# Patient Record
Sex: Male | Born: 1935 | Race: White | Hispanic: No | Marital: Married | Smoking: Never smoker
Health system: Southern US, Community
[De-identification: ages and names within clinical notes are randomized; demographics above are authoritative.]

## PROBLEM LIST (undated history)

## (undated) DIAGNOSIS — E785 Hyperlipidemia, unspecified: Secondary | ICD-10-CM

## (undated) DIAGNOSIS — I255 Ischemic cardiomyopathy: Secondary | ICD-10-CM

## (undated) DIAGNOSIS — I480 Paroxysmal atrial fibrillation: Secondary | ICD-10-CM

## (undated) DIAGNOSIS — I10 Essential (primary) hypertension: Secondary | ICD-10-CM

## (undated) DIAGNOSIS — L723 Sebaceous cyst: Secondary | ICD-10-CM

## (undated) DIAGNOSIS — M2630 Unspecified anomaly of tooth position of fully erupted tooth or teeth: Secondary | ICD-10-CM

## (undated) DIAGNOSIS — I491 Atrial premature depolarization: Secondary | ICD-10-CM

## (undated) DIAGNOSIS — I252 Old myocardial infarction: Secondary | ICD-10-CM

## (undated) DIAGNOSIS — M545 Low back pain, unspecified: Secondary | ICD-10-CM

## (undated) DIAGNOSIS — Z8673 Personal history of transient ischemic attack (TIA), and cerebral infarction without residual deficits: Secondary | ICD-10-CM

## (undated) DIAGNOSIS — I2510 Atherosclerotic heart disease of native coronary artery without angina pectoris: Secondary | ICD-10-CM

## (undated) DIAGNOSIS — M66329 Spontaneous rupture of flexor tendons, unspecified upper arm: Secondary | ICD-10-CM

## (undated) DIAGNOSIS — G250 Essential tremor: Secondary | ICD-10-CM

## (undated) DIAGNOSIS — K644 Residual hemorrhoidal skin tags: Secondary | ICD-10-CM

## (undated) DIAGNOSIS — G252 Other specified forms of tremor: Secondary | ICD-10-CM

## (undated) DIAGNOSIS — C61 Malignant neoplasm of prostate: Secondary | ICD-10-CM

## (undated) HISTORY — DX: Atrial premature depolarization: I49.1

## (undated) HISTORY — DX: Sebaceous cyst: L72.3

## (undated) HISTORY — DX: Low back pain: M54.5

## (undated) HISTORY — PX: OTHER SURGICAL HISTORY: SHX169

## (undated) HISTORY — DX: Ischemic cardiomyopathy: I25.5

## (undated) HISTORY — DX: Malignant neoplasm of prostate: C61

## (undated) HISTORY — DX: Essential (primary) hypertension: I10

## (undated) HISTORY — DX: Unspecified anomaly of tooth position of fully erupted tooth or teeth: M26.30

## (undated) HISTORY — DX: Residual hemorrhoidal skin tags: K64.4

## (undated) HISTORY — DX: Hyperlipidemia, unspecified: E78.5

## (undated) HISTORY — PX: BACK SURGERY: SHX140

## (undated) HISTORY — DX: Other specified forms of tremor: G25.2

## (undated) HISTORY — DX: Low back pain, unspecified: M54.50

## (undated) HISTORY — DX: Old myocardial infarction: I25.2

## (undated) HISTORY — DX: Essential tremor: G25.0

## (undated) HISTORY — DX: Spontaneous rupture of flexor tendons, unspecified upper arm: M66.329

## (undated) HISTORY — DX: Personal history of transient ischemic attack (TIA), and cerebral infarction without residual deficits: Z86.73

---
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
## 1998-06-02 HISTORY — PX: SPINE SURGERY: SHX786

## 1998-12-13 ENCOUNTER — Encounter: Payer: Self-pay | Admitting: Neurosurgery

## 1998-12-13 ENCOUNTER — Ambulatory Visit (HOSPITAL_COMMUNITY): Admission: RE | Admit: 1998-12-13 | Discharge: 1998-12-13 | Payer: Self-pay | Admitting: Neurosurgery

## 1999-02-01 ENCOUNTER — Encounter: Payer: Self-pay | Admitting: Neurosurgery

## 1999-02-05 ENCOUNTER — Ambulatory Visit (HOSPITAL_COMMUNITY): Admission: RE | Admit: 1999-02-05 | Discharge: 1999-02-05 | Payer: Self-pay | Admitting: Neurosurgery

## 1999-02-05 ENCOUNTER — Encounter: Payer: Self-pay | Admitting: Neurosurgery

## 1999-02-06 ENCOUNTER — Inpatient Hospital Stay (HOSPITAL_COMMUNITY): Admission: RE | Admit: 1999-02-06 | Discharge: 1999-02-06 | Payer: Self-pay | Admitting: Neurosurgery

## 1999-02-06 ENCOUNTER — Encounter: Payer: Self-pay | Admitting: Neurosurgery

## 2003-06-22 ENCOUNTER — Inpatient Hospital Stay (HOSPITAL_COMMUNITY): Admission: EM | Admit: 2003-06-22 | Discharge: 2003-06-24 | Payer: Self-pay | Admitting: Emergency Medicine

## 2003-06-23 ENCOUNTER — Encounter: Payer: Self-pay | Admitting: Cardiology

## 2003-09-16 ENCOUNTER — Emergency Department (HOSPITAL_COMMUNITY): Admission: EM | Admit: 2003-09-16 | Discharge: 2003-09-16 | Payer: Self-pay | Admitting: Emergency Medicine

## 2004-04-30 ENCOUNTER — Ambulatory Visit (HOSPITAL_COMMUNITY): Admission: RE | Admit: 2004-04-30 | Discharge: 2004-04-30 | Payer: Self-pay | Admitting: Internal Medicine

## 2004-05-01 ENCOUNTER — Ambulatory Visit (HOSPITAL_COMMUNITY): Admission: RE | Admit: 2004-05-01 | Discharge: 2004-05-01 | Payer: Self-pay | Admitting: Internal Medicine

## 2005-02-13 ENCOUNTER — Ambulatory Visit: Payer: Self-pay | Admitting: Internal Medicine

## 2005-03-24 ENCOUNTER — Encounter (INDEPENDENT_AMBULATORY_CARE_PROVIDER_SITE_OTHER): Payer: Self-pay | Admitting: Specialist

## 2005-03-24 ENCOUNTER — Ambulatory Visit: Payer: Self-pay | Admitting: Internal Medicine

## 2010-04-23 ENCOUNTER — Encounter
Admission: RE | Admit: 2010-04-23 | Discharge: 2010-05-22 | Payer: Self-pay | Source: Home / Self Care | Attending: Internal Medicine | Admitting: Internal Medicine

## 2010-10-18 NOTE — H&P (Signed)
NAME:  Henry Franklin, Henry Franklin                            ACCOUNT NO.:  [id number]   MEDICAL RECORD NO.:  [id number]                   PATIENT TYPE:  INP   LOCATION:  1829                                 FACILITY:  [facility]   PHYSICIAN:  Maxwell Caul, M.D.             DATE OF BIRTH:  Nov 14, 1926   DATE OF ADMISSION:  06/22/2003  DATE OF DISCHARGE:                                HISTORY & PHYSICAL   IDENTIFICATION:  This is a 77 year old male, a primary patient of Dr. Lenon Curt. Chilton Si, that arrives in the emergency room from home.   CHIEF COMPLAINT:  Left leg weakness and visual changes.   HISTORY OF PRESENT ILLNESS:  This is a healthy 77 year old man who noticed  over the last 24 to 48 hours that he developed clumsiness of his left leg,  noted that he was catching it on carpets or dragging it; yet yesterday, he  managed to play tennis in the morning.  Today, while driving, he noted that  the line in the middle of the road was curved, which resolved when he  closed one eye, but he does not describe true diplopia.  After this episode,  he came to the emergency room.  He denies headache, visual loss, dysphagia.  There are no sensory or motor changes in the upper extremities.  The  emergency room physician noted difficulties with cerebellar testing and he  has been referred for admission.   PAST MEDICAL HISTORY:  1. Low back pain.  2. Remote history of a seizure disorder in the 1960s, was on Dilantin,     however, has been off this for many years.  3. History of prostatectomy done at [facility] 10 years ago for cancer,     however, he has had no recurrence and has remained well.  He has had a     normal CT scan of the chest in September 2000, except for pleural     parenchymal scarring on the left.  He has had a reasonably normal MRI of     the L spine in 2000.  There is no history of atrial fibrillation, MI,     etc.   MEDICATIONS:  Only medication is enteric-coated ASA 81 mg p.o. daily.   He  takes this for prophylaxis only, denies any recent problems that would  precipitate the aspirin use.   SOCIAL HISTORY:  He lives at home with his wife.  He is active, independent,  plays tennis, drives, etc.   LABORATORY WORK TO DATE:  White count 6.6, hemoglobin 15.8.  INR is 0.9, PTT  30.  His comprehensive metabolic panel actually is quite normal, BUN of 23,  creatinine of 1.1.   CT scan suggests old frontal trauma with a possible prior ventriculostomy,  however, the patient is totally unaware of this history and denies any  previous neurosurgical investigations.   EKG is normal.  PHYSICAL EXAMINATION:  VITAL SIGNS:  Blood pressure 151/91, pulse 61.  HEENT:  Normal.  RESPIRATORY:  Exam is clear.  CARDIAC:  A 1/6 benign-sounding systolic ejection murmur.  There are no  carotid bruits.  ABDOMEN:  Normal.  CIRCULATION:  Normal peripheral pulses.  NEUROLOGIC:  Cranial nerves:  His cranial nerves are normal.  On extraocular  exam, the extraocular movements are normal with no nystagmus.  Visual fields  to confrontation seem normal.  Motor:  He has normal strength in both upper  and lower extremities.  He has no pronator drift.  His sensory exam is  normal to light touch.  Reflexes 1+ knee jerks and brachioradialis.  His  left plantar is clearly flexor; right is equivocal to downgoing.  Cerebellar:  He is very sloppy in the heel-to-shin on the left, also has  some difficulty on the right.  His finger-to-nose is abnormal on the left  side with dysdiadochokinesia and past-pointing.  His gait is mildly  unsteady, slightly wide-based to start.   IMPRESSION:  1. Acute cerebrovascular accident involving the posterior circulation.  I     think this probably involves the left cerebellar hemisphere and possibly     the right.  I also would consider right occipital lobe ischemia giving     him the left visual field problems.  2. Hypertension.  He is not a known hypertensive and this  could be     reflecting the setting of his acute neurologic event.  He does not     require treatment for this for now.   PLAN:  He will require an MRI, MRA, 2-D echo.  Enteric-coated aspirin at the  current dose of 81 mg will be continued, however, if he deteriorates  overnight, he would be a candidate for heparinization.  He has already been  reviewed by Dr. Tomasa Rand, who has kindly seen him for neurology, and he  concurs with current management.                                                Maxwell Caul, M.D.    MGR/MEDQ  D:  06/22/2003  T:  06/23/2003  Job:  [id number]

## 2010-10-18 NOTE — Consult Note (Signed)
NAME:  Henry Franklin, Henry Franklin                            ACCOUNT NO.:  [id number]   MEDICAL RECORD NO.:  [id number]                   PATIENT TYPE:  EMS   LOCATION:  MAJO                                 FACILITY:  [facility]   PHYSICIAN:  Michael L. Thad Ranger, M.D.           DATE OF BIRTH:  08-01-1926   DATE OF CONSULTATION:  06/22/2003  DATE OF DISCHARGE:                                   CONSULTATION   REFERRING PHYSICIAN:  Dr. Maxwell Caul.   REASON FOR EVALUATION:  Probable stroke.   HISTORY OF PRESENT ILLNESS:  This is the initial inpatient consultation  evaluation of this 77 year old man with little past medical history.  The  patient reports that yesterday he felt that his leg was draggy and did not  seem to work right.  He was actually able to play tennis yesterday but  symptoms persisted into the day.  He also had a single episode today while  driving of a vision alteration in which he said that the median stripe  curved to the left.  He was able to fix this by covering one eye or the  other.  He denied any definite diplopia, visual loss, dysarthria, or any  numbness or weakness of the extremities and does not feel that he had any  difficulty using his left arm.  He denies any previous symptoms.  He denies  any associated headache, chest pain, shortness of breath or palpitations.   PAST MEDICAL HISTORY:  He denies chronic medical problems.  There is a  question of him having a head injury in the past but he does not seem to  know much about this.   FAMILY HISTORY:  His family history is remarkable for diabetes.   SOCIAL HISTORY:  He does not smoke.  He is normally independent in his  activities of daily living and very active, frequently playing tennis.   ALLERGIES:  Denies.   MEDICATIONS:  Baby aspirin.   REVIEW OF SYSTEMS:  Full 10-system review of systems is negative, except as  outlined in the HPI in admission H&P.   PHYSICAL EXAM:  VITAL SIGNS:  Temperature 97.1, blood  pressure 151/91, pulse  61 and respirations 20.  GENERAL:  On general examination, he is alert, healthy-appearing and in no  evident distress.  HEAD:  Cranium is normocephalic and atraumatic.  Oropharynx is benign.  NECK:  Neck is supple without carotid or supraclavicular bruits.  HEART:  Bigeminy pattern but no murmurs.  NEUROLOGIC:  Mental status:  He is awake, alert and oriented to time, place  and person.  Recent and remote memory are intact.  Attention span,  concentration and fund of knowledge are appropriate.  Speech is fluent and  not dysarthric and he is able to repeat a phrase.  Mood is euthymic and  affect appropriate.  Cranial nerves:  Funduscopic exam is benign.  Pupils  are equal and briskly  reactive.  Extraocular movements are normal without  nystagmus.  Visual fields are full to confrontation.  Hearing is intact and  symmetric to finger rub.  Facial sensation is intact to pinprick.  Face,  tongue and palate move normally and symmetrically.  Shoulder shrug strength  is normal.  Motor testing:  Normal bulk and tone, normal strength in all  tested extremity muscles.  Sensation intact to light touch and pinprick  throughout.  Coordination:  Rapid movements are performed well.  Finger-to-  nose elicits a little bit of an end-point tremor on the left but very  significant heel-to-shin dysmetria on the left; there is also a bit of heel-  to-shin dysmetria on the right, not nearly as severe.  Gait:  He arises from  the bed with a bit of difficulty.  He is able to ambulate but is a bit  unsteady and tends to lean to the left just a bit.  Reflexes are 2+ and  symmetric.  Toes are downgoing.   LABORATORY REVIEW:  CBC is unremarkable.  CMET is remarkable only for a  mildly elevated glucose of 103.  Coagulations are normal.   CT of the head is personally reviewed and demonstrates some old frontal  calcifications which appear to be an old ventriculostomy, but no definite  acute  process.   IMPRESSION:  Left hemi-ataxia most likely due to subacute left cerebral  stroke.   RECOMMENDATIONS:  Would admit for stroke workup including MRI, MRA of the  head and neck, echocardiogram, lipids, homocysteine level.  Stroke service  will follow.                                               Michael L. Thad Ranger, M.D.    MLR/MEDQ  D:  06/22/2003  T:  06/24/2003  Job:  [id number]   cc:   Lenon Curt. Chilton Si, M.D.  [address]  [place]  [place] [address]  Fax: [phone]

## 2010-10-18 NOTE — Discharge Summary (Signed)
NAME:  Henry Franklin, Henry Franklin                            ACCOUNT NO.:  [id number]   MEDICAL RECORD NO.:  [id number]                   PATIENT TYPE:  INP   LOCATION:  3016                                 FACILITY:  [facility]   PHYSICIAN:  Maxwell Caul, M.D.             DATE OF BIRTH:  Jun 10, 1926   DATE OF ADMISSION:  06/22/2003  DATE OF DISCHARGE:  06/24/2003                                 DISCHARGE SUMMARY   HISTORY OF PRESENT ILLNESS:  This is a 77 year old man, a primary patient of  Dr. Frederik Pear who arrived in the emergency room from home complaining of  left leg weakness and visual changes.  The patient was stable until 24 to 48  hours prior to admission when he developed clumsiness of his left leg. He  noted that he was catching it on carpets or dragging it, although he managed  to play tennis in the morning of admission.  On the day of admission while  driving he noted that the line in the middle of the road was curved which  resolved when he closed one eye.  There was no history of true diplopia.  After this he came to the emergency room.  He denied headache, visual loss  or dysphagia.  There were no sensory or motor changes in the upper  extremities.   PAST MEDICAL HISTORY:  1. Low back pain.  2. Remote history of a seizure disorder in the 1960's.  He was on Dilantin,     however, this has been discontinued many years ago.  3. History of prostatectomy done at [facility] 10 years ago for cancer,     however, he has had no recurrence and has remained well.  He had a normal     CT scan of the chest in September of 2000.  He had a reasonably normal     MRI of the LS spine in 2000.  There is no history of atrial fibrillation,     myocardial infarction or etc.   MEDICATIONS:  On admission his only medication is enteric coated aspirin 81  mg daily.   LABORATORY DATA/OTHER INVESTIGATIONS:  White blood cell count 6.6,  hemoglobin 15.8  Sodium 136, potassium 4.0, cO2 28, BUN 23,  creatinine 1.1.  Liver function tests were normal.  Lipid profile revealed a total  cholesterol of 179, triglycerides 242, HDL cholesterol at 34, LDL of 97.  Radiology MRI of brain with and without contrast showed an acute infarct in  the right thalamus, calcified lesion in the right frontal lobe.  The feeling  was this could be a cavernous angioma or possibly an unusual, partially  calcified, tumor or infarct.  The patient did have an acute infarct in the  right thalamus, however, a follow up MRI was recommended to look at the area  in the right frontal lobe in six months.  MRA of neck: The  carotid  bifurcation is widely patent.  Both vertebral arteries are widely patent.  Electrocardiogram showed sinus bradycardia.  Echocardiogram showed normal  left ventricular ejection fraction at 55 to 65%.  Wall thickness was mildly  increased.   HOSPITAL COURSE:  This gentleman was admitted with what was felt to be an  acute cerebrovascular accident in the posterior fossa.  As it turned out he  had an acute infarct in the left thalamus.  He was kindly seen in  consultation by Dr. Kelli Hope of neurology.  In the hospital the  patient's symptoms rapidly resolved and he was felt  stable to be discharged by June 24, 2003.  He was discharge on Aggrenox  daily and Lipitor 10 mg daily.  He was to follow up with Dr. Chilton Si in two  weeks post discharge.  It was noted that he would need an MRI of his head in  six months to follow up on the right frontal lobe lesion.                                                Maxwell Caul, M.D.    MGR/MEDQ  D:  08/28/2003  T:  08/29/2003  Job:  [id number]   cc:   Casimiro Needle L. Thad Ranger, M.D.  [address]  [address]  [place]  [place] [address]  Fax: [phone]   Lenon Curt. Chilton Si, M.D.  [address]  [place]  [place] [address]  Fax: [phone]

## 2011-08-13 ENCOUNTER — Encounter: Payer: Self-pay | Admitting: *Deleted

## 2011-11-06 ENCOUNTER — Encounter: Payer: Self-pay | Admitting: Cardiology

## 2012-09-07 ENCOUNTER — Ambulatory Visit (INDEPENDENT_AMBULATORY_CARE_PROVIDER_SITE_OTHER): Payer: Medicare Other | Admitting: Internal Medicine

## 2012-09-07 ENCOUNTER — Encounter: Payer: Self-pay | Admitting: Internal Medicine

## 2012-09-07 VITALS — BP 150/90 | HR 58 | Temp 97.8°F | Resp 12 | Ht 69.0 in | Wt 162.0 lb

## 2012-09-07 DIAGNOSIS — C61 Malignant neoplasm of prostate: Secondary | ICD-10-CM

## 2012-09-07 DIAGNOSIS — E785 Hyperlipidemia, unspecified: Secondary | ICD-10-CM

## 2012-09-07 DIAGNOSIS — L600 Ingrowing nail: Secondary | ICD-10-CM

## 2012-09-07 DIAGNOSIS — R231 Pallor: Secondary | ICD-10-CM

## 2012-09-07 DIAGNOSIS — R251 Tremor, unspecified: Secondary | ICD-10-CM

## 2012-09-07 DIAGNOSIS — R259 Unspecified abnormal involuntary movements: Secondary | ICD-10-CM

## 2012-09-07 DIAGNOSIS — M79609 Pain in unspecified limb: Secondary | ICD-10-CM

## 2012-09-07 DIAGNOSIS — I10 Essential (primary) hypertension: Secondary | ICD-10-CM

## 2012-09-07 DIAGNOSIS — R269 Unspecified abnormalities of gait and mobility: Secondary | ICD-10-CM

## 2012-09-07 MED ORDER — HYDROCHLOROTHIAZIDE 25 MG PO TABS: ORAL_TABLET | ORAL | Status: DC

## 2012-09-07 NOTE — Patient Instructions (Signed)
Resume HCTZ.  Return in about 2 months for lab. We will consider whether you need to resume cholesterol then.

## 2012-09-07 NOTE — Progress Notes (Signed)
Subjective:     Patient ID: Henry Franklin, male   DOB: 1927-03-06, 79 y.o.   MRN: [id number]  HPI Larey Seat playing tennis. He is going to try playing again. Plays doubles. Has laid off playing over the winter due to the bad weather.  He ran out of HCTZ and quit taking it about 6-8 months ago,  Stopped taking the Lipitor in the last few months.  Episode of intense pain in the left great toe in last 2-3 weeks. Now resolved.  Review of Systems  Constitutional: Negative.   HENT: Negative.   Eyes: Negative.   Respiratory: Negative.   Cardiovascular: Negative.   Gastrointestinal: Negative.   Genitourinary: Negative.   Musculoskeletal: Positive for myalgias and back pain.  Skin: Negative.   Neurological:       CVA in 2005. Numbness. Mild tremor.  Hematological: Negative.   Psychiatric/Behavioral: Negative.        Objective: BP 150/90  Pulse 58  Temp(Src) 97.8 F (36.6 C) (Oral)  Resp 12  Ht 5\' 9"  (1.753 m)  Wt 162 lb (73.483 kg)  BMI 23.91 kg/m2  SpO2 96%    Physical Exam  Constitutional: He is oriented to person, place, and time. He appears well-developed and well-nourished. No distress.  HENT:  Head: Atraumatic.  Mild loss of hearing.  Eyes: Conjunctivae and EOM are normal. Pupils are equal, round, and reactive to light.  Neck: Normal range of motion. Neck supple. No JVD present. No tracheal deviation present. No thyromegaly present.  Cardiovascular: Regular rhythm, normal heart sounds and intact distal pulses.  Exam reveals no gallop and no friction rub.   No murmur heard. Pulmonary/Chest: Effort normal and breath sounds normal. No respiratory distress. He has no wheezes. He has no rales.  Abdominal: Soft. Bowel sounds are normal. He exhibits no distension and no mass. There is no tenderness.  Musculoskeletal: Normal range of motion. He exhibits tenderness. He exhibits no edema.  Low back paraspinal muscular  discomfort with flexion,, ext., and rotational movements.   Lymphadenopathy:    He has no cervical adenopathy.  Neurological: He is alert and oriented to person, place, and time. No cranial nerve deficit. Coordination normal.  Reduced vibratory sensation in both feet.  Skin: Skin is warm. No rash noted. No erythema. No pallor.  Psychiatric: He has a normal mood and affect. His behavior is normal. Thought content normal.       Assessment:     1. Ingrown toenail without infection observe  2. Other and unspecified hyperlipidemia Continue periodic lab monitoring - Lipid Panel; Future  3. Unspecified essential hypertension controlled - Basic Metabolic Panel; Future  4. Pallor and flushing(782.6) resolved  5. Tremor Mild and does not interfere with daily functioning  6. Abnormality of gait Mild and does not interfere with tennis  7. Malignant neoplasm of prostate No evidence of replapse  8. Pain in toe, left Ingrown nail has improved. Because of location, check uric acid - Uric Acid; Future

## 2012-09-09 ENCOUNTER — Other Ambulatory Visit: Payer: Self-pay | Admitting: *Deleted

## 2012-09-09 MED ORDER — ASPIRIN-DIPYRIDAMOLE ER 25-200 MG PO CP12: ORAL_CAPSULE | ORAL | Status: DC

## 2012-09-10 ENCOUNTER — Encounter: Payer: Self-pay | Admitting: Cardiology

## 2012-09-13 ENCOUNTER — Telehealth: Payer: Self-pay | Admitting: *Deleted

## 2012-09-13 NOTE — Telephone Encounter (Signed)
Patient read his AVS from 09/07/2012 he has some question about malignmant neoplasm is on there since that was 20 years ago. Also that the code for high blood pressure came up unspecified hypertension. Other concerns about diagnose that is on that form. Patient would like for Dr Chilton Si to call him back

## 2012-09-30 NOTE — Telephone Encounter (Signed)
Per Dr Chilton Si on AVS include old problems and sometimes even resolved problems. Patient has been notified

## 2012-11-08 ENCOUNTER — Other Ambulatory Visit: Payer: Medicare Other

## 2012-11-08 ENCOUNTER — Other Ambulatory Visit: Payer: Self-pay | Admitting: *Deleted

## 2012-11-08 DIAGNOSIS — Z0000 Encounter for general adult medical examination without abnormal findings: Secondary | ICD-10-CM

## 2012-11-08 DIAGNOSIS — I10 Essential (primary) hypertension: Secondary | ICD-10-CM

## 2012-11-08 DIAGNOSIS — E785 Hyperlipidemia, unspecified: Secondary | ICD-10-CM

## 2012-11-09 ENCOUNTER — Encounter: Payer: Self-pay | Admitting: *Deleted

## 2012-11-09 LAB — BASIC METABOLIC PANEL
BUN/Creatinine Ratio: 15 (ref 10–22)
Creatinine, Ser: 1.08 mg/dL (ref 0.76–1.27)
GFR calc Af Amer: 73 mL/min/{1.73_m2} (ref >59)
GFR calc non Af Amer: 63 mL/min/{1.73_m2} (ref >59)
Sodium: 141 mmol/L (ref 134–144)

## 2012-11-09 LAB — LIPID PANEL
Cholesterol, Total: 207 mg/dL — ABNORMAL HIGH (ref 100–199)
Triglycerides: 114 mg/dL (ref 0–149)

## 2012-11-10 ENCOUNTER — Ambulatory Visit (INDEPENDENT_AMBULATORY_CARE_PROVIDER_SITE_OTHER): Payer: Medicare Other | Admitting: Internal Medicine

## 2012-11-10 VITALS — BP 130/78 | HR 64 | Temp 98.5°F | Resp 14 | Ht 69.0 in | Wt 157.4 lb

## 2012-11-10 DIAGNOSIS — I491 Atrial premature depolarization: Secondary | ICD-10-CM

## 2012-11-10 DIAGNOSIS — G252 Other specified forms of tremor: Secondary | ICD-10-CM

## 2012-11-10 DIAGNOSIS — M545 Low back pain, unspecified: Secondary | ICD-10-CM

## 2012-11-10 DIAGNOSIS — R002 Palpitations: Secondary | ICD-10-CM

## 2012-11-10 DIAGNOSIS — L600 Ingrowing nail: Secondary | ICD-10-CM

## 2012-11-10 DIAGNOSIS — G250 Essential tremor: Secondary | ICD-10-CM | POA: Insufficient documentation

## 2012-11-10 DIAGNOSIS — E785 Hyperlipidemia, unspecified: Secondary | ICD-10-CM

## 2012-11-10 MED ORDER — ATORVASTATIN CALCIUM 10 MG PO TABS: 10.0000 mg | ORAL_TABLET | Freq: Every day | ORAL | Status: DC

## 2012-11-10 NOTE — Patient Instructions (Signed)
Resume atorvastatin (Lipitor).

## 2012-11-10 NOTE — Progress Notes (Signed)
Subjective:    Patient ID: Henry Franklin, Korte, male    DOB: 04-04-1927, 79 y.o.   MRN: [id number]  HPI Nocturia x 2. Some urgency.  Rare palpitations  Bradycardia.  Dry food like a cracker occasionally sticks in throat.  Relates that he had hepatitis when in the [facility] in 1954.  Current Outpatient Prescriptions on File Prior to Visit  Medication Sig Dispense Refill  . Cholecalciferol (VITAMIN D-3) 1000 UNITS CAPS Take by mouth. Take one tablet by mouth once daily.      [name] dipyridamole-aspirin (AGGRENOX) 200-25 MG per 12 hr capsule Take one capsule twice a day to prevent stroke  60 capsule  5  . dipyridamole-aspirin (AGGRENOX) 25-200 MG per 12 hr capsule Take 1 capsule by mouth daily.      . hydrochlorothiazide (HYDRODIURIL) 25 MG tablet One tablet daily to control BP  90 tablet  4   No current facility-administered medications on file prior to visit.    Review of Systems  Constitutional: Negative.   HENT: Negative.   Eyes: Negative.   Respiratory: Negative.   Cardiovascular: Negative.   Gastrointestinal: Negative.   Genitourinary: Negative.   Musculoskeletal: Positive for myalgias and back pain.  Skin: Negative.   Neurological:       CVA in 2005. Numbness. Mild tremor.  Hematological: Negative.   Psychiatric/Behavioral: Negative.        Objective:BP 130/78  Pulse 64  Temp(Src) 98.5 F (36.9 C) (Oral)  Resp 14  Ht 5\' 9"  (1.753 m)  Wt 157 lb 6.4 oz (71.396 kg)  BMI 23.23 kg/m2  SpO2 93%    Physical Exam  Constitutional: He is oriented to person, place, and time. He appears well-developed and well-nourished. No distress.  HENT:  Head: Atraumatic.  Mild loss of hearing.  Eyes: Conjunctivae and EOM are normal. Pupils are equal, round, and reactive to light.  Neck: Normal range of motion. Neck supple. No JVD present. No tracheal deviation present. No thyromegaly present.  Cardiovascular: Regular rhythm, normal heart sounds and intact distal pulses.  Exam reveals no  gallop and no friction rub.   No murmur heard. Pulmonary/Chest: Effort normal and breath sounds normal. No respiratory distress. He has no wheezes. He has no rales.  Abdominal: Soft. Bowel sounds are normal. He exhibits no distension and no mass. There is no tenderness.  Musculoskeletal: Normal range of motion. He exhibits tenderness. He exhibits no edema.  Low back paraspinal muscular  discomfort with flexion,, ext., and rotational movements.  Lymphadenopathy:    He has no cervical adenopathy.  Neurological: He is alert and oriented to person, place, and time. No cranial nerve deficit. Coordination normal.  Reduced vibratory sensation in both feet.  Skin: Skin is warm. No rash noted. No erythema. No pallor.  .Area of the left great toe has a a surface that appears to be a healed area of a previous ingrown toenail.  Psychiatric: He has a normal mood and affect. His behavior is normal. Thought content normal.     Appointment on 11/08/2012  Component Date Value Range Status  . Uric Acid 11/08/2012 6.3  3.7 - 8.6 mg/dL Final              Therapeutic target for gout patients: <6.0  . Cholesterol, Total 11/08/2012 207* 100 - 199 mg/dL Final  . Triglycerides 11/08/2012 114  0 - 149 mg/dL Final  . HDL 44/06/270 48  >39 mg/dL Final   Comment: According to ATP-III Guidelines, HDL-C >59 mg/dL is considered  a                          negative risk factor for CHD.  [name] VLDL Cholesterol Cal 11/08/2012 23  5 - 40 mg/dL Final  . LDL Calculated 11/08/2012 454* 0 - 99 mg/dL Final  . Chol/HDL Ratio 11/08/2012 4.3  0.0 - 5.0 ratio units Final  . Glucose 11/08/2012 92  65 - 99 mg/dL Final  . BUN 09/81/1914 16  8 - 27 mg/dL Final  . Creatinine, Ser 11/08/2012 1.08  0.76 - 1.27 mg/dL Final  . GFR calc non Af Amer 11/08/2012 63  >59 mL/min/1.73 Final  . GFR calc Af Amer 11/08/2012 73  >59 mL/min/1.73 Final  . BUN/Creatinine Ratio 11/08/2012 15  10 - 22 Final  . Sodium 11/08/2012 141  134 - 144 mmol/L Final   . Potassium 11/08/2012 4.1  3.5 - 5.2 mmol/L Final  . Chloride 11/08/2012 103  97 - 108 mmol/L Final  . CO2 11/08/2012 23  19 - 28 mmol/L Final   Comment: **Effective November 15, 2012, the reference interval for**                            Carbon Dioxide, Total will be changing to:                                                    0 - 30 days        15 - 27                                                 31 d - 5 months       15 - 26                                                  6 m - 11 months      15 - 25                                                    1 - 12 years       17 - 27                                                      > 12 years       18 - 29  . Calcium 11/08/2012 9.1  8.6 - 10.2 mg/dL Final  . PSA 78/29/5621 <0.1  0.0 - 4.0 ng/mL Final   Comment: Roche ECLIA methodology.                          According  to the American Urological Association, Serum PSA should                          decrease and remain at undetectable levels after radical                          prostatectomy. The AUA defines biochemical recurrence as an initial                          PSA value 0.2 ng/mL or greater followed by a subsequent confirmatory                          PSA value 0.2 ng/mL or greater.                          Values obtained with different assay methods or kits cannot be used                          interchangeably. Results cannot be interpreted as absolute evidence                          of the presence or absence of malignant disease.        Assessment & Plan:  Hyperlipidemia: Adequately controlled   - Plan: atorvastatin (LIPITOR) 10 MG tablet, Lipid panel  Essential and other specified forms of tremor: Unchanged  Supraventricular premature beats: Unchanged and do not interfere with his life  Lumbago: Exercise related. He is doing okay with over-the-counter NSAIDs  Palpitations : Minor disturbance  - Plan: Basic metabolic panel  Ingrown toenail without  infection: Appears to have healed

## 2013-01-05 ENCOUNTER — Other Ambulatory Visit: Payer: Self-pay

## 2013-04-07 ENCOUNTER — Other Ambulatory Visit: Payer: Self-pay

## 2013-05-06 ENCOUNTER — Other Ambulatory Visit: Payer: Medicare Other

## 2013-05-06 DIAGNOSIS — R002 Palpitations: Secondary | ICD-10-CM

## 2013-05-06 DIAGNOSIS — E785 Hyperlipidemia, unspecified: Secondary | ICD-10-CM

## 2013-05-07 LAB — LIPID PANEL
Chol/HDL Ratio: 3.2 ratio units (ref 0.0–5.0)
HDL: 54 mg/dL (ref >39)
LDL Calculated: 96 mg/dL (ref 0–99)
Triglycerides: 120 mg/dL (ref 0–149)

## 2013-05-07 LAB — BASIC METABOLIC PANEL
CO2: 26 mmol/L (ref 18–29)
Calcium: 9.9 mg/dL (ref 8.6–10.2)
GFR calc non Af Amer: 59 mL/min/{1.73_m2} — ABNORMAL LOW (ref >59)
Potassium: 4.5 mmol/L (ref 3.5–5.2)
Sodium: 143 mmol/L (ref 134–144)

## 2013-05-10 ENCOUNTER — Ambulatory Visit (INDEPENDENT_AMBULATORY_CARE_PROVIDER_SITE_OTHER): Payer: Medicare Other | Admitting: Internal Medicine

## 2013-05-10 ENCOUNTER — Encounter: Payer: Self-pay | Admitting: Internal Medicine

## 2013-05-10 VITALS — BP 140/80 | HR 68 | Temp 97.8°F | Resp 16 | Wt 161.0 lb

## 2013-05-10 DIAGNOSIS — G250 Essential tremor: Secondary | ICD-10-CM

## 2013-05-10 DIAGNOSIS — M545 Low back pain: Secondary | ICD-10-CM

## 2013-05-10 DIAGNOSIS — W19XXXA Unspecified fall, initial encounter: Secondary | ICD-10-CM | POA: Insufficient documentation

## 2013-05-10 DIAGNOSIS — R002 Palpitations: Secondary | ICD-10-CM

## 2013-05-10 NOTE — Progress Notes (Signed)
Subjective:    Patient ID: Henry Franklin, male    DOB: 09-21-1926, 79 y.o.   MRN: [id number]  Chief Complaint  Patient presents with  . Medical Managment of Chronic Issues    6 month f/u   . other    nasal congestion/allergies has been taking OTC allergy medication with some relief    HPI Had some cough and congestion over the last 3 months. He thinks it is due to allergy.  Essential and other specified forms of tremor: unchanged  Lumbago: unchanged. Worse after exercise  Palpitations: improved  Falls, initial encounter: fell 3 times while playing tennis. No injury. Has quit tennis. Now rides bike. Is trying pickle ball.    Current Outpatient Prescriptions on File Prior to Visit  Medication Sig Dispense Refill  . atorvastatin (LIPITOR) 10 MG tablet Take 1 tablet (10 mg total) by mouth daily. To lower cholesterol  90 tablet  3  . Cholecalciferol (VITAMIN D-3) 1000 UNITS CAPS Take by mouth. Take one tablet by mouth once daily.      [name] dipyridamole-aspirin (AGGRENOX) 200-25 MG per 12 hr capsule Take one capsule twice a day to prevent stroke  60 capsule  5  . dipyridamole-aspirin (AGGRENOX) 25-200 MG per 12 hr capsule Take 1 capsule by mouth daily.      . hydrochlorothiazide (HYDRODIURIL) 25 MG tablet One tablet daily to control BP  90 tablet  4   No current facility-administered medications on file prior to visit.    Review of Systems  Constitutional: Negative.   HENT: Negative.   Eyes: Negative.   Respiratory: Negative.   Cardiovascular: Negative.   Gastrointestinal: Negative.   Genitourinary: Negative.   Musculoskeletal: Positive for back pain and myalgias.  Skin: Negative.   Neurological:       CVA in 2005. Numbness. Mild tremor.  Hematological: Negative.   Psychiatric/Behavioral: Negative.        Objective:BP 140/80  Pulse 68  Temp(Src) 97.8 F (36.6 C) (Oral)  Resp 16  Wt 161 lb (73.029 kg)  SpO2 94%    Physical Exam  Constitutional: He is oriented  to person, place, and time. He appears well-developed and well-nourished. No distress.  HENT:  Head: Atraumatic.  Mild loss of hearing.  Eyes: Conjunctivae and EOM are normal. Pupils are equal, round, and reactive to light.  Neck: Normal range of motion. Neck supple. No JVD present. No tracheal deviation present. No thyromegaly present.  Cardiovascular: Regular rhythm, normal heart sounds and intact distal pulses.  Exam reveals no gallop and no friction rub.   No murmur heard. Pulmonary/Chest: Effort normal and breath sounds normal. No respiratory distress. He has no wheezes. He has no rales.  Abdominal: Soft. Bowel sounds are normal. He exhibits no distension and no mass. There is no tenderness.  Musculoskeletal: Normal range of motion. He exhibits tenderness. He exhibits no edema.  Low back paraspinal muscular  discomfort with flexion,, ext., and rotational movements.  Lymphadenopathy:    He has no cervical adenopathy.  Neurological: He is alert and oriented to person, place, and time. No cranial nerve deficit. Coordination normal.  Reduced vibratory sensation in both feet.  Skin: Skin is warm. No rash noted. No erythema. No pallor.  .Area of the left great toe has a a surface that appears to be a healed area of a previous ingrown toenail.  Psychiatric: He has a normal mood and affect. His behavior is normal. Thought content normal.  Assessment & Plan:  1. Essential and other specified forms of tremor unchanged  2. Lumbago unchanged  3. Palpitations improved  4. Falls, initial encounter Has reduced activity

## 2013-05-10 NOTE — Patient Instructions (Signed)
Continue current medication.

## 2013-05-23 ENCOUNTER — Encounter: Payer: Self-pay | Admitting: Internal Medicine

## 2013-08-08 ENCOUNTER — Other Ambulatory Visit: Payer: Self-pay | Admitting: *Deleted

## 2013-08-10 ENCOUNTER — Other Ambulatory Visit: Payer: Self-pay | Admitting: *Deleted

## 2013-08-10 MED ORDER — ASPIRIN-DIPYRIDAMOLE ER 25-200 MG PO CP12: ORAL_CAPSULE | ORAL | Status: DC

## 2013-08-10 NOTE — Telephone Encounter (Signed)
[facility] 

## 2013-08-19 ENCOUNTER — Other Ambulatory Visit: Payer: Self-pay | Admitting: *Deleted

## 2013-08-19 DIAGNOSIS — E785 Hyperlipidemia, unspecified: Secondary | ICD-10-CM

## 2013-08-19 MED ORDER — ATORVASTATIN CALCIUM 10 MG PO TABS: 10.0000 mg | ORAL_TABLET | Freq: Every day | ORAL | Status: DC

## 2013-08-19 MED ORDER — HYDROCHLOROTHIAZIDE 25 MG PO TABS: ORAL_TABLET | ORAL | Status: DC

## 2013-08-19 NOTE — Telephone Encounter (Signed)
[facility] 

## 2013-10-31 ENCOUNTER — Other Ambulatory Visit: Payer: Self-pay | Admitting: Internal Medicine

## 2013-11-11 ENCOUNTER — Other Ambulatory Visit: Payer: Medicare Other

## 2013-11-11 DIAGNOSIS — G252 Other specified forms of tremor: Principal | ICD-10-CM

## 2013-11-11 DIAGNOSIS — G250 Essential tremor: Secondary | ICD-10-CM

## 2013-11-12 LAB — LIPID PANEL
CHOL/HDL RATIO: 3 ratio (ref 0.0–5.0)
CHOLESTEROL TOTAL: 145 mg/dL (ref 100–199)
HDL: 48 mg/dL (ref >39)
LDL CALC: 78 mg/dL (ref 0–99)
TRIGLYCERIDES: 97 mg/dL (ref 0–149)
VLDL CHOLESTEROL CAL: 19 mg/dL (ref 5–40)

## 2013-11-12 LAB — COMPREHENSIVE METABOLIC PANEL
ALBUMIN: 4.1 g/dL (ref 3.5–4.7)
ALT: 20 IU/L (ref 0–44)
AST: 24 IU/L (ref 0–40)
Albumin/Globulin Ratio: 2.2 (ref 1.1–2.5)
Alkaline Phosphatase: 65 IU/L (ref 39–117)
BILIRUBIN TOTAL: 0.8 mg/dL (ref 0.0–1.2)
BUN / CREAT RATIO: 16 (ref 10–22)
BUN: 19 mg/dL (ref 8–27)
CHLORIDE: 105 mmol/L (ref 97–108)
CO2: 25 mmol/L (ref 18–29)
CREATININE: 1.17 mg/dL (ref 0.76–1.27)
Calcium: 9.6 mg/dL (ref 8.6–10.2)
GFR calc non Af Amer: 57 mL/min/{1.73_m2} — ABNORMAL LOW (ref >59)
GFR, EST AFRICAN AMERICAN: 66 mL/min/{1.73_m2} (ref >59)
GLOBULIN, TOTAL: 1.9 g/dL (ref 1.5–4.5)
GLUCOSE: 93 mg/dL (ref 65–99)
Potassium: 4.2 mmol/L (ref 3.5–5.2)
Sodium: 145 mmol/L — ABNORMAL HIGH (ref 134–144)
TOTAL PROTEIN: 6 g/dL (ref 6.0–8.5)

## 2013-11-15 ENCOUNTER — Ambulatory Visit (INDEPENDENT_AMBULATORY_CARE_PROVIDER_SITE_OTHER): Payer: Medicare Other | Admitting: Internal Medicine

## 2013-11-15 ENCOUNTER — Encounter: Payer: Self-pay | Admitting: Internal Medicine

## 2013-11-15 VITALS — BP 120/72 | HR 58 | Temp 97.9°F | Resp 18 | Ht 69.0 in | Wt 155.0 lb

## 2013-11-15 DIAGNOSIS — M545 Low back pain, unspecified: Secondary | ICD-10-CM

## 2013-11-15 DIAGNOSIS — E785 Hyperlipidemia, unspecified: Secondary | ICD-10-CM

## 2013-11-15 DIAGNOSIS — M48061 Spinal stenosis, lumbar region without neurogenic claudication: Secondary | ICD-10-CM

## 2013-11-15 DIAGNOSIS — W19XXXA Unspecified fall, initial encounter: Secondary | ICD-10-CM

## 2013-11-15 DIAGNOSIS — R002 Palpitations: Secondary | ICD-10-CM

## 2013-11-15 DIAGNOSIS — G250 Essential tremor: Secondary | ICD-10-CM

## 2013-11-15 DIAGNOSIS — G252 Other specified forms of tremor: Secondary | ICD-10-CM

## 2013-11-15 DIAGNOSIS — M47816 Spondylosis without myelopathy or radiculopathy, lumbar region: Secondary | ICD-10-CM | POA: Insufficient documentation

## 2013-11-15 NOTE — Patient Instructions (Signed)
Continue current medications. 

## 2013-11-15 NOTE — Progress Notes (Signed)
Patient ID: Henry Franklin, male   DOB: January 24, 1927, 80 y.o.   MRN: [id number]    Location:    [facility]  Place of Service:  OFFICE  Extended Emergency Contact Information Primary Emergency Contact: Souffrant,Carol A Address: [place], [place] Home Phone: [phone] Relation: Other   Chief Complaint  Patient presents with  . Annual Exam    Drainage down throat x 1 month.    HPI:   Hyperlipidemia: controlled  Essential and other specified forms of tremor  Lumbago: some discomfort when bending over and gardening  Falls: quit playing tennis a few months ago because he fell a few times  Palpitations: none  Having some drainage in back of throat.    Past Medical History  Diagnosis Date  . HTN (hypertension)   . Stroke   . TIA (transient ischemic attack)   . Hyperlipidemia   . Malignant neoplasm of prostate   . Essential and other specified forms of tremor   . Supraventricular premature beats   . Other premature beats   . Cardiac dysrhythmia, unspecified   . Acute, but ill-defined, cerebrovascular disease   . Unspecified late effects of cerebrovascular disease   . External hemorrhoids without mention of complication   . Unspecified anomaly of tooth position   . Cellulitis and abscess of unspecified site   . Sebaceous cyst   . Lumbago   . Nontraumatic rupture of tendons of biceps (long head)   . Other convulsions   . Dizziness and giddiness   . Other malaise and fatigue   . Palpitations   . Encounter for long-term (current) use of other medications     Past Surgical History  Procedure Laterality Date  . Prostate cancer    . Back surgery      with sciatica  . Appendectomy  1942  . Spine surgery N/A 2000    lumbar/[facility]    History   Social History  . Marital Status: Married    Spouse Name: N/A    Number of Children: N/A  . Years of Education: N/A   Occupational History  . retired    Social History Main Topics  . Smoking  status: Never Smoker   . Smokeless tobacco: Not on file  . Alcohol Use: No  . Drug Use: Not on file  . Sexual Activity: Not on file   Other Topics Concern  . Not on file   Social History Narrative  . No narrative on file     reports that he has never smoked. He does not have any smokeless tobacco history on file. He reports that he does not drink alcohol. His drug history is not on file.  Immunization History  Administered Date(s) Administered  . Influenza-Unspecified 02/07/2009, 03/02/2013  . Pneumococcal Polysaccharide-23 06/02/1998  . Tdap 06/24/2011  . Zoster 06/25/2006    No Known Allergies  Medications: Patient's Medications  New Prescriptions   No medications on file  Previous Medications   ATORVASTATIN (LIPITOR) 10 MG TABLET    TAKE 1 TABLET (10 MG TOTAL) BY MOUTH DAILY. TO LOWER CHOLESTEROL   CHOLECALCIFEROL (VITAMIN D-3) 1000 UNITS CAPS    Take by mouth. Take one tablet by mouth once daily.   DIPYRIDAMOLE-ASPIRIN (AGGRENOX) 200-25 MG PER 12 HR CAPSULE    Take one capsule twice a day to prevent stroke   HYDROCHLOROTHIAZIDE (HYDRODIURIL) 25 MG TABLET    TAKE 1 TABLET BY MOUTH  EVERY DAY TO CONTROL BLOOD PRESSURE   MULTIPLE VITAMIN (MULTIVITAMIN) TABLET    Take 1 tablet by mouth daily.  Modified Medications   No medications on file  Discontinued Medications   No medications on file     Review of Systems  Constitutional: Negative.   HENT: Negative.   Eyes: Negative.   Respiratory: Negative.   Cardiovascular: Negative.   Gastrointestinal: Negative.   Genitourinary: Negative.   Musculoskeletal: Positive for back pain and myalgias.  Skin: Negative.   Neurological:       CVA in 2005. Numbness. Mild tremor.  Hematological: Negative.   Psychiatric/Behavioral: Negative.     Filed Vitals:   11/15/13 1404  BP: 120/72  Pulse: 58  Temp: 97.9 F (36.6 C)  Resp: 18  Height: 5\' 9"  (1.753 m)  Weight: 155 lb (70.308 kg)  SpO2: 97%   Body mass index is 22.88  kg/(m^2).  Physical Exam  Constitutional: He is oriented to person, place, and time. He appears well-developed and well-nourished. No distress.  HENT:  Head: Atraumatic.  Mild loss of hearing.  Eyes: Conjunctivae and EOM are normal. Pupils are equal, round, and reactive to light.  Neck: Normal range of motion. Neck supple. No JVD present. No tracheal deviation present. No thyromegaly present.  Cardiovascular: Regular rhythm, normal heart sounds and intact distal pulses.  Exam reveals no gallop and no friction rub.   No murmur heard. Pulmonary/Chest: Effort normal and breath sounds normal. No respiratory distress. He has no wheezes. He has no rales.  Abdominal: Soft. Bowel sounds are normal. He exhibits no distension and no mass. There is no tenderness.  Musculoskeletal: Normal range of motion. He exhibits tenderness. He exhibits no edema.  Low back paraspinal muscular  discomfort with flexion,, ext., and rotational movements.  Lymphadenopathy:    He has no cervical adenopathy.  Neurological: He is alert and oriented to person, place, and time. No cranial nerve deficit. Coordination normal.  Reduced vibratory sensation in both feet.  Skin: Skin is warm. No rash noted. No erythema. No pallor.  .Area of the left great toe has a a surface that appears to be a healed area of a previous ingrown toenail.  Psychiatric: He has a normal mood and affect. His behavior is normal. Thought content normal.     Labs reviewed: Appointment on 11/11/2013  Component Date Value Ref Range Status  . Glucose 11/11/2013 93  65 - 99 mg/dL Final  . BUN 11/11/2013 19  8 - 27 mg/dL Final  . Creatinine, Ser 11/11/2013 1.17  0.76 - 1.27 mg/dL Final  . GFR calc non Af Amer 11/11/2013 57* >59 mL/min/1.73 Final  . GFR calc Af Amer 11/11/2013 66  >59 mL/min/1.73 Final  . BUN/Creatinine Ratio 11/11/2013 16  10 - 22 Final  . Sodium 11/11/2013 145* 134 - 144 mmol/L Final  . Potassium 11/11/2013 4.2  3.5 - 5.2 mmol/L  Final  . Chloride 11/11/2013 105  97 - 108 mmol/L Final  . CO2 11/11/2013 25  18 - 29 mmol/L Final  . Calcium 11/11/2013 9.6  8.6 - 10.2 mg/dL Final  . Total Protein 11/11/2013 6.0  6.0 - 8.5 g/dL Final  . Albumin 11/11/2013 4.1  3.5 - 4.7 g/dL Final  . Globulin, Total 11/11/2013 1.9  1.5 - 4.5 g/dL Final  . Albumin/Globulin Ratio 11/11/2013 2.2  1.1 - 2.5 Final  . Total Bilirubin 11/11/2013 0.8  0.0 - 1.2 mg/dL Final  . Alkaline Phosphatase 11/11/2013 65  39 - 117  IU/L Final  . AST 11/11/2013 24  0 - 40 IU/L Final  . ALT 11/11/2013 20  0 - 44 IU/L Final  . Cholesterol, Total 11/11/2013 145  100 - 199 mg/dL Final  . Triglycerides 11/11/2013 97  0 - 149 mg/dL Final  . HDL 11/11/2013 48  >39 mg/dL Final   Comment: According to ATP-III Guidelines, HDL-C >59 mg/dL is considered a                          negative risk factor for CHD.  [name] VLDL Cholesterol Cal 11/11/2013 19  5 - 40 mg/dL Final  . LDL Calculated 11/11/2013 78  0 - 99 mg/dL Final  . Chol/HDL Ratio 11/11/2013 3.0  0.0 - 5.0 ratio units Final   Comment:                                   T. Chol/HDL Ratio                                                                      Men  Women                                                        1/2 Avg.Risk  3.4    3.3                                                            Avg.Risk  5.0    4.4                                                         2X Avg.Risk  9.6    7.1                                                         3X Avg.Risk 23.4   11.0   11/15/13 EKG: rate 55. NSR. Normal.   Assessment/Plan 1. Hyperlipidemia controlled  2. Essential and other specified forms of tremor unchanged  3. Lumbago Related to spinal stenosis and arthritis in back  4. Falls Unstable on standing. At risk for falls  5. Palpitations none  6. Spinal stenosis of lumbar region discussed repeat MR LS. Last done in 2000. He is not sure he wants this done. I am concerned the Spinal  stenosis is creating more back pain and contributing to the instability and fall risk,.

## 2013-11-18 ENCOUNTER — Encounter: Payer: Self-pay | Admitting: Internal Medicine

## 2014-02-11 ENCOUNTER — Other Ambulatory Visit: Payer: Self-pay | Admitting: Internal Medicine

## 2014-03-17 ENCOUNTER — Other Ambulatory Visit: Payer: Self-pay

## 2014-04-05 ENCOUNTER — Ambulatory Visit (INDEPENDENT_AMBULATORY_CARE_PROVIDER_SITE_OTHER): Payer: Medicare Other | Admitting: Internal Medicine

## 2014-04-05 ENCOUNTER — Encounter: Payer: Self-pay | Admitting: Internal Medicine

## 2014-04-05 VITALS — BP 140/70 | HR 81 | Temp 98.1°F | Ht 69.0 in | Wt 157.2 lb

## 2014-04-05 DIAGNOSIS — M4806 Spinal stenosis, lumbar region: Secondary | ICD-10-CM

## 2014-04-05 DIAGNOSIS — M545 Low back pain, unspecified: Secondary | ICD-10-CM

## 2014-04-05 DIAGNOSIS — G252 Other specified forms of tremor: Principal | ICD-10-CM

## 2014-04-05 DIAGNOSIS — G250 Essential tremor: Secondary | ICD-10-CM

## 2014-04-05 DIAGNOSIS — M48061 Spinal stenosis, lumbar region without neurogenic claudication: Secondary | ICD-10-CM

## 2014-04-05 DIAGNOSIS — R251 Tremor, unspecified: Secondary | ICD-10-CM

## 2014-04-05 DIAGNOSIS — E785 Hyperlipidemia, unspecified: Secondary | ICD-10-CM

## 2014-04-05 DIAGNOSIS — C61 Malignant neoplasm of prostate: Secondary | ICD-10-CM

## 2014-04-05 NOTE — Progress Notes (Signed)
Patient ID: Henry Franklin, male   DOB: 02-16-1927, 80 y.o.   MRN: [id number]    HISTORY AND PHYSICAL  Location:    [facility]   Place of Service:   OFFICE  Extended Emergency Contact Information Primary Emergency Contact: [place],Carol A Address: [address]          [place], [place] Phone: [phone] Relation: Other   Chief Complaint  Patient presents with  . Annual Exam    Yearly physical    HPI:  Essential and other specified forms of tremor - unchanged and not interfering with ADL.  Midline low back pain without sciatica: unchanged  Spinal stenosis of lumbar region: contributes to back pains  Hyperlipidemia - controlled  Prostate cancer - prior surgery. No known relapse.    Past Medical History  Diagnosis Date  . HTN (hypertension)   . Stroke   . TIA (transient ischemic attack)   . Hyperlipidemia   . Malignant neoplasm of prostate   . Essential and other specified forms of tremor   . Supraventricular premature beats   . Other premature beats   . Cardiac dysrhythmia, unspecified   . Acute, but ill-defined, cerebrovascular disease   . Unspecified late effects of cerebrovascular disease   . External hemorrhoids without mention of complication   . Unspecified anomaly of tooth position   . Cellulitis and abscess of unspecified site   . Sebaceous cyst   . Lumbago   . Nontraumatic rupture of tendons of biceps (long head)   . Other convulsions   . Dizziness and giddiness   . Other malaise and fatigue   . Palpitations   . Encounter for long-term (current) use of other medications     Past Surgical History  Procedure Laterality Date  . Prostate cancer    . Back surgery      with sciatica  . Appendectomy  1942  . Spine surgery N/A 2000    [facility]    Patient Care Team: Henry Dooms, MD as PCP - General (Internal Medicine) Charlie Pitter, MD as Consulting Physician (Neurosurgery) Darlin Coco, MD as Consulting Physician (Cardiology) Irene Shipper,  MD as Consulting Physician (Gastroenterology) Lynne Logan, MD as Referring Physician (Urology) Eulis Manly. Gershon Crane, MD as Consulting Physician (Ophthalmology)  History   Social History  . Marital Status: Married    Spouse Name: N/A    Number of Children: N/A  . Years of Education: N/A   Occupational History  . retired    Social History Main Topics  . Smoking status: Never Smoker   . Smokeless tobacco: Not on file  . Alcohol Use: No  . Drug Use: Not on file  . Sexual Activity: Not on file   Other Topics Concern  . Not on file   Social History Narrative     reports that he has never smoked. He does not have any smokeless tobacco history on file. He reports that he does not drink alcohol. His drug history is not on file.  Immunization History  Administered Date(s) Administered  . Influenza-Unspecified 02/07/2009, 03/02/2013  . Pneumococcal Polysaccharide-23 06/02/1998  . Tdap 06/24/2011  . Zoster 06/25/2006    No Known Allergies  Medications: Patient's Medications  New Prescriptions   No medications on file  Previous Medications   ATORVASTATIN (LIPITOR) 10 MG TABLET    TAKE 1 TABLET (10 MG TOTAL) BY MOUTH DAILY. TO LOWER CHOLESTEROL   CHOLECALCIFEROL (VITAMIN D-3) 1000 UNITS CAPS    Take  by mouth. Take one tablet by mouth once daily.   DIPYRIDAMOLE-ASPIRIN (AGGRENOX) 200-25 MG PER 12 HR CAPSULE    TAKE ONE CAPSULE BY MOUTH TWICE DAILY TO PREVENT STROKE   HYDROCHLOROTHIAZIDE (HYDRODIURIL) 25 MG TABLET    TAKE 1 TABLET BY MOUTH EVERY DAY TO CONTROL BLOOD PRESSURE   MULTIPLE VITAMIN (MULTIVITAMIN) TABLET    Take 1 tablet by mouth daily.  Modified Medications   No medications on file  Discontinued Medications   No medications on file     Review of Systems  Constitutional: Negative.   HENT: Negative.   Eyes: Negative.   Respiratory: Negative.   Cardiovascular: Negative.   Gastrointestinal: Negative.   Genitourinary: Negative.   Musculoskeletal: Positive for  myalgias, back pain and gait problem.  Skin: Negative.   Neurological:       CVA in 2005. Numbness. Mild tremor. History of falls while playing tennis.  Hematological: Negative.   Psychiatric/Behavioral: Negative.     Filed Vitals:   04/05/14 1256  BP: 140/70  Pulse: 81  Temp: 98.1 F (36.7 C)  TempSrc: Oral  Height: 5\' 9"  (1.753 m)  Weight: 157 lb 3.2 oz (71.305 kg)  SpO2: 99%   Body mass index is 23.2 kg/(m^2).  Physical Exam  Constitutional: He is oriented to person, place, and time. He appears well-developed and well-nourished. No distress.  HENT:  Head: Atraumatic.  Mild loss of hearing.  Eyes: Conjunctivae and EOM are normal. Pupils are equal, round, and reactive to light.  Neck: Normal range of motion. Neck supple. No JVD present. No tracheal deviation present. No thyromegaly present.  Cardiovascular: Normal rate, regular rhythm, normal heart sounds and intact distal pulses.  Exam reveals no gallop and no friction rub.   No murmur heard. Pulmonary/Chest: Effort normal and breath sounds normal. No respiratory distress. He has no wheezes. He has no rales.  Abdominal: Soft. Bowel sounds are normal. He exhibits no distension and no mass. There is no tenderness.  Musculoskeletal: Normal range of motion. He exhibits tenderness. He exhibits no edema.  Low back paraspinal muscular  discomfort with flexion,, ext., and rotational movements.  Lymphadenopathy:    He has no cervical adenopathy.  Neurological: He is alert and oriented to person, place, and time. No cranial nerve deficit. Coordination normal.  Reduced vibratory sensation in both feet. 04/05/14 MMSE 29/30. Passed clock drawing.  Skin: Skin is warm. No rash noted. No erythema. No pallor.  .Area of the left great toe has a a surface that appears to be a healed area of a previous ingrown toenail.  Psychiatric: He has a normal mood and affect. His behavior is normal. Thought content normal.     Labs reviewed: No  visits with results within 3 Month(s) from this visit. Latest known visit with results is:  Appointment on 11/11/2013  Component Date Value Ref Range Status  . Glucose 11/11/2013 93  65 - 99 mg/dL Final  . BUN 11/11/2013 19  8 - 27 mg/dL Final  . Creatinine, Ser 11/11/2013 1.17  0.76 - 1.27 mg/dL Final  . GFR calc non Af Amer 11/11/2013 57* >59 mL/min/1.73 Final  . GFR calc Af Amer 11/11/2013 66  >59 mL/min/1.73 Final  . BUN/Creatinine Ratio 11/11/2013 16  10 - 22 Final  . Sodium 11/11/2013 145* 134 - 144 mmol/L Final  . Potassium 11/11/2013 4.2  3.5 - 5.2 mmol/L Final  . Chloride 11/11/2013 105  97 - 108 mmol/L Final  . CO2 11/11/2013 25  18 -  29 mmol/L Final  . Calcium 11/11/2013 9.6  8.6 - 10.2 mg/dL Final  . Total Protein 11/11/2013 6.0  6.0 - 8.5 g/dL Final  . Albumin 11/11/2013 4.1  3.5 - 4.7 g/dL Final  . Globulin, Total 11/11/2013 1.9  1.5 - 4.5 g/dL Final  . Albumin/Globulin Ratio 11/11/2013 2.2  1.1 - 2.5 Final  . Total Bilirubin 11/11/2013 0.8  0.0 - 1.2 mg/dL Final  . Alkaline Phosphatase 11/11/2013 65  39 - 117 IU/L Final  . AST 11/11/2013 24  0 - 40 IU/L Final  . ALT 11/11/2013 20  0 - 44 IU/L Final  . Cholesterol, Total 11/11/2013 145  100 - 199 mg/dL Final  . Triglycerides 11/11/2013 97  0 - 149 mg/dL Final  . HDL 11/11/2013 48  >39 mg/dL Final   Comment: According to ATP-III Guidelines, HDL-C >59 mg/dL is considered a                          negative risk factor for CHD.  [name] VLDL Cholesterol Cal 11/11/2013 19  5 - 40 mg/dL Final  . LDL Calculated 11/11/2013 78  0 - 99 mg/dL Final  . Chol/HDL Ratio 11/11/2013 3.0  0.0 - 5.0 ratio units Final   Comment:                                   T. Chol/HDL Ratio                                                                      Men  Women                                                        1/2 Avg.Risk  3.4    3.3                                                            Avg.Risk  5.0    4.4                                                          2X Avg.Risk  9.6    7.1                                                         3X Avg.Risk 23.4   11.0     Assessment/Plan  1. Essential and other specified forms of  tremor - Comprehensive metabolic panel; Future  2. Midline low back pain without sciatica unchanged  3. Spinal stenosis of lumbar region Continue to monitor. He does not want referral at this time.  4. Hyperlipidemia - Lipid panel; Future  5. Prostate cancer - PSA; Future

## 2014-04-05 NOTE — Progress Notes (Signed)
Patient passed clock test.

## 2014-05-17 ENCOUNTER — Ambulatory Visit: Payer: Medicare Other | Admitting: Internal Medicine

## 2014-05-20 ENCOUNTER — Emergency Department (HOSPITAL_COMMUNITY): Payer: Medicare Other

## 2014-05-20 ENCOUNTER — Encounter (HOSPITAL_COMMUNITY): Payer: Self-pay | Admitting: Adult Health

## 2014-05-20 ENCOUNTER — Inpatient Hospital Stay (HOSPITAL_COMMUNITY)
Admission: EM | Admit: 2014-05-20 | Discharge: 2014-05-22 | DRG: 176 | Disposition: A | Discharge status: Home / Self Care | Payer: Medicare Other | Attending: Internal Medicine | Admitting: Internal Medicine

## 2014-05-20 DIAGNOSIS — M48061 Spinal stenosis, lumbar region without neurogenic claudication: Secondary | ICD-10-CM | POA: Diagnosis present

## 2014-05-20 DIAGNOSIS — I679 Cerebrovascular disease, unspecified: Secondary | ICD-10-CM | POA: Diagnosis present

## 2014-05-20 DIAGNOSIS — E785 Hyperlipidemia, unspecified: Secondary | ICD-10-CM | POA: Diagnosis present

## 2014-05-20 DIAGNOSIS — R0602 Shortness of breath: Secondary | ICD-10-CM | POA: Insufficient documentation

## 2014-05-20 DIAGNOSIS — Z7901 Long term (current) use of anticoagulants: Secondary | ICD-10-CM | POA: Diagnosis not present

## 2014-05-20 DIAGNOSIS — I491 Atrial premature depolarization: Secondary | ICD-10-CM | POA: Diagnosis present

## 2014-05-20 DIAGNOSIS — I2699 Other pulmonary embolism without acute cor pulmonale: Secondary | ICD-10-CM | POA: Diagnosis present

## 2014-05-20 DIAGNOSIS — I10 Essential (primary) hypertension: Secondary | ICD-10-CM | POA: Diagnosis present

## 2014-05-20 DIAGNOSIS — M4806 Spinal stenosis, lumbar region: Secondary | ICD-10-CM | POA: Diagnosis present

## 2014-05-20 DIAGNOSIS — Z8546 Personal history of malignant neoplasm of prostate: Secondary | ICD-10-CM | POA: Diagnosis not present

## 2014-05-20 DIAGNOSIS — M47816 Spondylosis without myelopathy or radiculopathy, lumbar region: Secondary | ICD-10-CM | POA: Diagnosis present

## 2014-05-20 DIAGNOSIS — I6990 Unspecified sequelae of unspecified cerebrovascular disease: Secondary | ICD-10-CM

## 2014-05-20 DIAGNOSIS — Z8673 Personal history of transient ischemic attack (TIA), and cerebral infarction without residual deficits: Secondary | ICD-10-CM

## 2014-05-20 LAB — CBC
HEMATOCRIT: 51.3 % (ref 39.0–52.0)
Hemoglobin: 17.2 g/dL — ABNORMAL HIGH (ref 13.0–17.0)
MCH: 31.5 pg (ref 26.0–34.0)
MCHC: 33.5 g/dL (ref 30.0–36.0)
MCV: 94 fL (ref 78.0–100.0)
Platelets: 190 10*3/uL (ref 150–400)
RBC: 5.46 MIL/uL (ref 4.22–5.81)
RDW: 13.7 % (ref 11.5–15.5)
WBC: 11.1 10*3/uL — ABNORMAL HIGH (ref 4.0–10.5)

## 2014-05-20 LAB — HEPATIC FUNCTION PANEL
ALT: 22 U/L (ref 0–53)
AST: 24 U/L (ref 0–37)
Albumin: 3.8 g/dL (ref 3.5–5.2)
Alkaline Phosphatase: 88 U/L (ref 39–117)
TOTAL PROTEIN: 7.1 g/dL (ref 6.0–8.3)
Total Bilirubin: 0.6 mg/dL (ref 0.3–1.2)

## 2014-05-20 LAB — BASIC METABOLIC PANEL
ANION GAP: 13 (ref 5–15)
BUN: 21 mg/dL (ref 6–23)
CALCIUM: 9.5 mg/dL (ref 8.4–10.5)
CO2: 28 mEq/L (ref 19–32)
CREATININE: 1.15 mg/dL (ref 0.50–1.35)
Chloride: 97 mEq/L (ref 96–112)
GFR calc non Af Amer: 56 mL/min — ABNORMAL LOW (ref >90)
GFR, EST AFRICAN AMERICAN: 65 mL/min — AB (ref >90)
Glucose, Bld: 140 mg/dL — ABNORMAL HIGH (ref 70–99)
Potassium: 3.8 mEq/L (ref 3.7–5.3)
Sodium: 138 mEq/L (ref 137–147)

## 2014-05-20 LAB — TROPONIN I: Troponin I: <0.3 ng/mL (ref <0.30)

## 2014-05-20 LAB — I-STAT TROPONIN, ED: Troponin i, poc: 0 ng/mL (ref 0.00–0.08)

## 2014-05-20 LAB — PRO B NATRIURETIC PEPTIDE: PRO B NATRI PEPTIDE: 123.8 pg/mL (ref 0–450)

## 2014-05-20 MED ORDER — HEPARIN BOLUS VIA INFUSION
4000.0000 [IU] | Freq: Once | INTRAVENOUS | Status: AC
  Administered 2014-05-20: 4000 [IU] via INTRAVENOUS
  Filled 2014-05-20: qty 4000

## 2014-05-20 MED ORDER — HEPARIN (PORCINE) IN NACL 100-0.45 UNIT/ML-% IJ SOLN
1100.0000 [IU]/h | INTRAMUSCULAR | Status: DC
  Administered 2014-05-20: 1000 [IU]/h via INTRAVENOUS
  Administered 2014-05-21: 1100 [IU]/h via INTRAVENOUS
  Filled 2014-05-20 (×4): qty 250

## 2014-05-20 MED ORDER — HYDROMORPHONE HCL 1 MG/ML IJ SOLN
0.5000 mg | Freq: Once | INTRAMUSCULAR | Status: AC
  Administered 2014-05-20: 0.5 mg via INTRAVENOUS
  Filled 2014-05-20: qty 1

## 2014-05-20 MED ORDER — IOHEXOL 350 MG/ML SOLN
75.0000 mL | Freq: Once | INTRAVENOUS | Status: AC | PRN
  Administered 2014-05-20: 75 mL via INTRAVENOUS

## 2014-05-20 MED ORDER — SODIUM CHLORIDE 0.9 % IV BOLUS (SEPSIS)
500.0000 mL | Freq: Once | INTRAVENOUS | Status: AC
  Administered 2014-05-20: 500 mL via INTRAVENOUS

## 2014-05-20 NOTE — Progress Notes (Signed)
ANTICOAGULATION CONSULT NOTE - Initial Consult  Pharmacy Consult for heparin Indication: pulmonary embolus  No Known Allergies  Patient Measurements: Height: 5' 8.9" (175 cm) Weight: 156 lb 8.4 oz (71 kg) IBW/kg (Calculated) : 70.47 Heparin Dosing Weight: 70kg  Vital Signs: BP: 115/67 mmHg (12/19 2136) Pulse Rate: 95 (12/19 2136)  Labs:  Recent Labs  05/20/14 1918  HGB 17.2*  HCT 51.3  PLT 190  CREATININE 1.15  TROPONINI <0.30    Estimated Creatinine Clearance: 46.8 mL/min (by C-G formula based on Cr of 1.15).   Medical History: Past Medical History  Diagnosis Date  . HTN (hypertension)   . Stroke   . TIA (transient ischemic attack)   . Hyperlipidemia   . Malignant neoplasm of prostate   . Essential and other specified forms of tremor   . Supraventricular premature beats   . Other premature beats   . Cardiac dysrhythmia, unspecified   . Acute, but ill-defined, cerebrovascular disease   . Unspecified late effects of cerebrovascular disease   . External hemorrhoids without mention of complication   . Unspecified anomaly of tooth position   . Cellulitis and abscess of unspecified site   . Sebaceous cyst   . Lumbago   . Nontraumatic rupture of tendons of biceps (long head)   . Other convulsions   . Dizziness and giddiness   . Other malaise and fatigue   . Palpitations   . Encounter for long-term (current) use of other medications     Assessment: 80 year old male presents to [facility] with shortness of breath and chest pain. Hx of TIA on aggrenox. Right sided PE found on CT scan. Will start IV heparin.  CBC normal.  Goal of Therapy:  Heparin level 0.3-0.7 units/ml Monitor platelets by anticoagulation protocol: Yes   Plan:  Give 4000 units bolus x 1 Start heparin infusion at 1000 units/hr Check anti-Xa level in 8 hours and daily while on heparin Continue to monitor H&H and platelets  Erin Hearing PharmD., BCPS Clinical Pharmacist Pager  [phone] 05/20/2014 10:05 PM

## 2014-05-20 NOTE — ED Notes (Signed)
Current writer unable to place bed request .  Chart showing employee Henry Franklin has been in the chart since 748 pm.

## 2014-05-20 NOTE — H&P (Signed)
[facility] Admission History and Physical       Henry Franklin. PNT:[id number] DOB: [birth year] DOA: 05/20/2014  Referring physician: EDP PCP: Henry Dooms, MD  Specialists:   Chief Complaint: SOB and Chest Pain  HPI: Henry Franklin. is a 80 y.o. 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  He denies fevers chills cough and hemoptysis.  He was found to have a moderate size Pulmonary Embolism on CTA of the Chest.    He was started on a Heparin drip.     Review of Systems:  Constitutional: No Weight Loss, No Weight Gain, Night Sweats, Fevers, Chills, Dizziness, Fatigue, or Generalized Weakness HEENT: No Headaches, Difficulty Swallowing,Tooth/Dental Problems,Sore Throat,  No Sneezing, Rhinitis, Ear Ache, Nasal Congestion, or Post Nasal Drip,  Cardio-vascular:  No Chest pain, Orthopnea, PND, Edema in Lower Extremities, Anasarca, Dizziness, Palpitations  Resp: No Dyspnea, No DOE, No Productive Cough, No Non-Productive Cough, No Hemoptysis, No Wheezing.    GI: No Heartburn, Indigestion, Abdominal Pain, Nausea, Vomiting, Diarrhea, Hematemesis, Hematochezia, Melena, Change in Bowel Habits,  Loss of Appetite  GU: No Dysuria, Change in Color of Urine, No Urgency or Frequency, No Flank pain.  Musculoskeletal: No Joint Pain or Swelling, No Decreased Range of Motion, No Back Pain.  Neurologic: No Syncope, No Seizures, Muscle Weakness, Paresthesia, Vision Disturbance or Loss, No Diplopia, No Vertigo, No Difficulty Walking,  Skin: No Rash or Lesions. Psych: No Change in Mood or Affect, No Depression or Anxiety, No Memory loss, No Confusion, or Hallucinations   Past Medical History  Diagnosis Date  . HTN (hypertension)   . Stroke   . TIA (transient ischemic attack)   . Hyperlipidemia   . Malignant neoplasm of prostate   . Essential and other specified forms of tremor   . Supraventricular  premature beats   . Other premature beats   . Cardiac dysrhythmia, unspecified   . Acute, but ill-defined, cerebrovascular disease   . Unspecified late effects of cerebrovascular disease   . External hemorrhoids without mention of complication   . Unspecified anomaly of tooth position   . Cellulitis and abscess of unspecified site   . Sebaceous cyst   . Lumbago   . Nontraumatic rupture of tendons of biceps (long head)   . Other convulsions   . Dizziness and giddiness   . Other malaise and fatigue   . Palpitations   . Encounter for long-term (current) use of other medications       Past Surgical History  Procedure Laterality Date  . Prostate cancer    . Back surgery      with sciatica  . Appendectomy  1942  . Spine surgery N/A 2000    [place]       Prior to Admission medications   Medication Sig Start Date End Date Taking? Authorizing Provider  atorvastatin (LIPITOR) 10 MG tablet TAKE 1 TABLET (10 MG TOTAL) BY MOUTH DAILY. TO LOWER CHOLESTEROL 10/31/13  Yes Henry Dooms, MD  Cholecalciferol (VITAMIN D-3) 1000 UNITS CAPS Take by mouth. Take one tablet by mouth once daily.   Yes Historical Provider, MD  diphenhydrAMINE (SOMINEX) 25 MG tablet Take 25 mg by mouth daily as needed for allergies or sleep.   Yes Historical Provider, MD  dipyridamole-aspirin (AGGRENOX) 200-25 MG per 12 hr capsule Take 1 capsule by mouth daily.   Yes Historical Provider, MD  hydrochlorothiazide (HYDRODIURIL) 25 MG tablet TAKE 1 TABLET BY MOUTH EVERY DAY TO CONTROL BLOOD PRESSURE 10/31/13  Yes Henry Dooms, MD  Multiple Vitamin (MULTIVITAMIN) tablet Take 1 tablet by mouth daily.   Yes Historical Provider, MD  dipyridamole-aspirin (AGGRENOX) 200-25 MG per 12 hr capsule TAKE ONE CAPSULE BY MOUTH TWICE DAILY TO PREVENT STROKE Patient not taking: Reported on 05/20/2014 02/13/14   Henry Dooms, MD      No Known Allergies   Social History:  reports that he has never smoked. He does not have any  smokeless tobacco history on file. He reports that he does not drink alcohol. His drug history is not on file.     Family History  Problem Relation Age of Onset  . Cancer Father 35    brain tumor       Physical Exam:  GEN:  Pleasant Elderly Well Nourished and Well Developed  80 y.o. Caucasian male examined  and in no acute distress; cooperative with exam Filed Vitals:   05/20/14 2200 05/20/14 2215 05/20/14 2230 05/20/14 2300  BP: 127/90 132/79 133/70 128/64  Pulse: 65 63 36 60  Resp: 17 11 16 12   Height:      Weight:      SpO2: 93% 94% 94% 95%   Blood pressure 128/64, pulse 60, resp. rate 12, height 5' 8.9" (1.75 m), weight 71 kg (156 lb 8.4 oz), SpO2 95 %. PSYCH: He is alert and oriented x4; does not appear anxious does not appear depressed; affect is normal HEENT: Normocephalic and Atraumatic, Mucous membranes pink; PERRLA; EOM intact; Fundi:  Benign;  No scleral icterus, Nares: Patent, Oropharynx: Clear, Fair Dentition,    Neck:  FROM, No Cervical Lymphadenopathy nor Thyromegaly or Carotid Bruit; No JVD; Breasts:: Not examined CHEST WALL: No tenderness CHEST: Normal respiration, clear to auscultation bilaterally HEART: Regular rate and rhythm; no murmurs rubs or gallops BACK: No kyphosis or scoliosis; No CVA tenderness ABDOMEN: Positive Bowel Sounds, Soft Non-Tender; No Masses, No Organomegaly. Rectal Exam: Not done EXTREMITIES: No Cyanosis, Clubbing, or Edema; No Ulcerations. Genitalia: not examined PULSES: 2+ and symmetric SKIN: Normal hydration no rash or ulceration CNS:  Alert and Oriented x 4, No Focal Deficits Vascular: pulses palpable throughout    Labs on Admission:  Basic Metabolic Panel:  Recent Labs Lab 05/20/14 1918  NA 138  K 3.8  CL 97  CO2 28  GLUCOSE 140*  BUN 21  CREATININE 1.15  CALCIUM 9.5   Liver Function Tests:  Recent Labs Lab 05/20/14 1918  AST 24  ALT 22  ALKPHOS 88  BILITOT 0.6  PROT 7.1  ALBUMIN 3.8   No results for  input(s): LIPASE, AMYLASE in the last 168 hours. No results for input(s): AMMONIA in the last 168 hours. CBC:  Recent Labs Lab 05/20/14 1918  WBC 11.1*  HGB 17.2*  HCT 51.3  MCV 94.0  PLT 190   Cardiac Enzymes:  Recent Labs Lab 05/20/14 1918  TROPONINI <0.30    BNP (last 3 results)  Recent Labs  05/20/14 1918  PROBNP 123.8   CBG: No results for input(s): GLUCAP in the last 168 hours.  Radiological Exams on Admission: Ct Angio Chest Pe W/cm &/or Wo Cm  05/20/2014   CLINICAL DATA:  Right-sided chest pain with shortness of breath that began yesterday. Difficulty taking deep breath. History of prostate cancer.  EXAM: CT ANGIOGRAPHY CHEST WITH CONTRAST  TECHNIQUE: Multidetector CT imaging of the chest was performed using the standard protocol  during bolus administration of intravenous contrast. Multiplanar CT image reconstructions and MIPs were obtained to evaluate the vascular anatomy.  CONTRAST:  40mL OMNIPAQUE IOHEXOL 350 MG/ML SOLN  COMPARISON:  None.  FINDINGS: Mediastinum: Multiple filling defects are noted within the RIGHT pulmonary arterial tree, including the RIGHT main pulmonary artery, RIGHT upper lobe vessels, and predominantly RIGHT lower lobe vessels consistent with acute pulmonary emboli. No similar left-sided emboli.  Findings suggesting RIGHT heart strain with abnormally dilated RIGHT ventricle. RV/LV ratio of 1.39 based on widest inner wall measurements of 32 mm RV and 23 mm LV.  No pericardial fluid, thickening or calcification. No acute abnormality of the thoracic aorta or other great vessels of the mediastinum. No pathologically enlarged mediastinal or hilar lymph nodes. The esophagus is normal in appearance.  Lungs/Pleura: Patchy BILATERAL subsegmental atelectasis, ground-glass opacities, and peripheral consolidation. Particularly on the LEFT, a small wedge-shaped configuration could represent a pulmonary infarct, although no definite LEFT lower lobe emboli are  seen. No pleural effusions. No suspicious appearing pulmonary nodules or masses.  Upper Abdomen: Visualized portions of the upper abdomen are unremarkable.  Musculoskeletal: No aggressive appearing lytic or blastic lesions are noted in the visualized portions of the skeleton.  Review of the MIP images confirms the above findings.  IMPRESSION: Moderate clot burden in the RIGHT pulmonary artery and its major branches consistent with acute pulmonary emboli. No similar emboli on the LEFT.  CT evidence of right heart strain (RV/LV Ratio = 1.39) consistent with at least submassive (intermediate risk)PE. The presence of right heart strain has been associated with an increased risk of morbidity and mortality. Consultation with Pulmonary and Critical Care Medicine is recommended.  Nonspecific areas of lower lobe bibasilar subsegmental atelectasis, ground-glass opacities, and peripheral consolidation. See discussion above.  Critical Value/emergent results were called by telephone at the time of interpretation on 05/20/2014 at 9:45 pm to Dr. Pamella Pert , who verbally acknowledged these results.   Electronically Signed   By: Rolla Flatten M.D.   On: 05/20/2014 21:47   Dg Chest Port 1 View  05/20/2014   CLINICAL DATA:  Short of breath, right-sided chest pain some right flank pain  EXAM: PORTABLE CHEST - 1 VIEW  COMPARISON:  Radiograph 09/16/2003  FINDINGS: Normal cardiac silhouette with ectatic aorta. There is left basilar atelectasis. No effusion, infiltrate, pneumothorax. Dedicated view of the right shoulder/right hemi thorax is unremarkable.  IMPRESSION: 1. No evidence of right rib fracture. 2. No thoracic trauma. 3. Left basilar atelectasis versus infiltrate.   Electronically Signed   By: Suzy Bouchard M.D.   On: 05/20/2014 21:13     EKG: Independently reviewed.    Assessment/Plan:   80 y.o. male with  Principal Problem:   1.   Pulmonary embolism   IV Heparin Drip  Active Problems:   2.   HTN  (hypertension)   Continue HCTZ   Monitor BPs    3.  Hyperlipidemia   Continue Atorvastatin Rx     4.  Spinal stenosis of lumbar region   Pain control PRN        5.  Unspecified late effects of cerebrovascular disease   On Aggrenox Rx     6.  DVT Prophylaxis   On IV Heparin Drip     Code Status: FULL CODE      Family Communication:    No Family Present Disposition Plan:         Time spent:  42 MInutes  [facility] Pager  [phone]   If 7AM -7PM Please Contact the Day Rounding Team MD for [facility]  If 7PM-7AM, Please Contact Night-Floor Coverage  [email or web address] Password [email or web address] 05/20/2014, 11:15 PM

## 2014-05-20 NOTE — ED Notes (Signed)
Presents with right sided chest pain and SOB began last night and has worsened today. Reports difficulty taking a deep breath. Breath sounds on right diminished-sats 92-95% RA. Alert and oreinted and able to speak in short phrases. Restless and c/o severe pain on right chest/. Equal chest expansion, denies injury.

## 2014-05-20 NOTE — ED Provider Notes (Signed)
CSN: [id number]     Arrival date & time 05/20/14  1903 History   First MD Initiated Contact with Patient 05/20/14 1929     Chief Complaint  Patient presents with  . Shortness of Breath  . Chest Pain     (Consider location/radiation/quality/duration/timing/severity/associated sxs/prior Treatment) Patient is a 80 y.o. male presenting with shortness of breath and chest pain. The history is provided by the patient.  Shortness of Breath Severity:  Mild Onset quality:  Sudden Duration:  1 day Timing:  Constant Progression:  Worsening Chronicity:  New Context comment:  At rest Relieved by:  Nothing Worsened by:  Nothing tried Ineffective treatments:  NSAIDs Associated symptoms: no abdominal pain, no chest pain, no cough, no fever, no headaches, no neck pain and no vomiting   Chest Pain Associated symptoms: shortness of breath   Associated symptoms: no abdominal pain, no cough, no fever, no headache, no nausea, no numbness and not vomiting     Past Medical History  Diagnosis Date  . HTN (hypertension)   . Stroke   . TIA (transient ischemic attack)   . Hyperlipidemia   . Malignant neoplasm of prostate   . Essential and other specified forms of tremor   . Supraventricular premature beats   . Other premature beats   . Cardiac dysrhythmia, unspecified   . Acute, but ill-defined, cerebrovascular disease   . Unspecified late effects of cerebrovascular disease   . External hemorrhoids without mention of complication   . Unspecified anomaly of tooth position   . Cellulitis and abscess of unspecified site   . Sebaceous cyst   . Lumbago   . Nontraumatic rupture of tendons of biceps (long head)   . Other convulsions   . Dizziness and giddiness   . Other malaise and fatigue   . Palpitations   . Encounter for long-term (current) use of other medications    Past Surgical History  Procedure Laterality Date  . Prostate cancer    . Back surgery      with sciatica  . Appendectomy   1942  . Spine surgery N/A 2000    [place]   Family History  Problem Relation Age of Onset  . Cancer Father 52    brain tumor   History  Substance Use Topics  . Smoking status: Never Smoker   . Smokeless tobacco: Not on file  . Alcohol Use: No    Review of Systems  Constitutional: Negative for fever.  HENT: Negative for drooling and rhinorrhea.   Eyes: Negative for pain.  Respiratory: Positive for chest tightness and shortness of breath. Negative for cough.   Cardiovascular: Negative for chest pain and leg swelling.  Gastrointestinal: Negative for nausea, vomiting, abdominal pain and diarrhea.  Genitourinary: Negative for dysuria and hematuria.  Musculoskeletal: Negative for gait problem and neck pain.  Skin: Negative for color change.  Neurological: Negative for numbness and headaches.  Hematological: Negative for adenopathy.  Psychiatric/Behavioral: Negative for behavioral problems.  All other systems reviewed and are negative.     Allergies  Review of patient's allergies indicates no known allergies.  Home Medications   Prior to Admission medications   Medication Sig Start Date End Date Taking? Authorizing Provider  atorvastatin (LIPITOR) 10 MG tablet TAKE 1 TABLET (10 MG TOTAL) BY MOUTH DAILY. TO LOWER CHOLESTEROL 10/31/13   Estill Dooms, MD  Cholecalciferol (VITAMIN D-3) 1000 UNITS CAPS Take by mouth. Take one tablet by mouth once daily.    Historical Provider, MD  dipyridamole-aspirin (AGGRENOX) 200-25 MG per 12 hr capsule TAKE ONE CAPSULE BY MOUTH TWICE DAILY TO PREVENT STROKE 02/13/14   Estill Dooms, MD  hydrochlorothiazide (HYDRODIURIL) 25 MG tablet TAKE 1 TABLET BY MOUTH EVERY DAY TO CONTROL BLOOD PRESSURE 10/31/13   Estill Dooms, MD  Multiple Vitamin (MULTIVITAMIN) tablet Take 1 tablet by mouth daily.    Historical Provider, MD   BP 185/83 mmHg  Pulse 89  Resp 28  SpO2 95% Physical Exam  Constitutional: He is oriented to person, place, and time. He appears  well-developed and well-nourished. He appears distressed.  HENT:  Head: Normocephalic and atraumatic.  Right Ear: External ear normal.  Left Ear: External ear normal.  Nose: Nose normal.  Mouth/Throat: Oropharynx is clear and moist. No oropharyngeal exudate.  Eyes: Conjunctivae and EOM are normal. Pupils are equal, round, and reactive to light.  Neck: Normal range of motion. Neck supple.  Cardiovascular: Normal rate, regular rhythm, normal heart sounds and intact distal pulses.  Exam reveals no gallop and no friction rub.   No murmur heard. Pulmonary/Chest: He is in respiratory distress. He has no wheezes.  Mild tachypnea with diminished breath sounds bilaterally.  Abdominal: Soft. Bowel sounds are normal. He exhibits no distension. There is no tenderness. There is no rebound and no guarding.  Musculoskeletal: Normal range of motion. He exhibits no edema or tenderness.  Neurological: He is alert and oriented to person, place, and time.  Skin: Skin is warm and dry.  Psychiatric: He has a normal mood and affect. His behavior is normal.  Nursing note and vitals reviewed.   ED Course  Procedures (including critical care time) Labs Review Labs Reviewed  CBC - Abnormal; Notable for the following:    WBC 11.1 (*)    Hemoglobin 17.2 (*)    All other components within normal limits  BASIC METABOLIC PANEL - Abnormal; Notable for the following:    Glucose, Bld 140 (*)    GFR calc non Af Amer 56 (*)    GFR calc Af Amer 65 (*)    All other components within normal limits  PRO B NATRIURETIC PEPTIDE  HEPATIC FUNCTION PANEL  TROPONIN I  HEPARIN LEVEL (UNFRACTIONATED)  CBC  HEPARIN LEVEL (UNFRACTIONATED)  I-STAT TROPOININ, ED    Imaging Review Ct Angio Chest Pe W/cm &/or Wo Cm  05/20/2014   CLINICAL DATA:  Right-sided chest pain with shortness of breath that began yesterday. Difficulty taking deep breath. History of prostate cancer.  EXAM: CT ANGIOGRAPHY CHEST WITH CONTRAST  TECHNIQUE:  Multidetector CT imaging of the chest was performed using the standard protocol during bolus administration of intravenous contrast. Multiplanar CT image reconstructions and MIPs were obtained to evaluate the vascular anatomy.  CONTRAST:  28mL OMNIPAQUE IOHEXOL 350 MG/ML SOLN  COMPARISON:  None.  FINDINGS: Mediastinum: Multiple filling defects are noted within the RIGHT pulmonary arterial tree, including the RIGHT main pulmonary artery, RIGHT upper lobe vessels, and predominantly RIGHT lower lobe vessels consistent with acute pulmonary emboli. No similar left-sided emboli.  Findings suggesting RIGHT heart strain with abnormally dilated RIGHT ventricle. RV/LV ratio of 1.39 based on widest inner wall measurements of 32 mm RV and 23 mm LV.  No pericardial fluid, thickening or calcification. No acute abnormality of the thoracic aorta or other great vessels of the mediastinum. No pathologically enlarged mediastinal or hilar lymph nodes. The esophagus is normal in appearance.  Lungs/Pleura: Patchy BILATERAL subsegmental atelectasis, ground-glass opacities, and peripheral consolidation. Particularly on the LEFT, a small  wedge-shaped configuration could represent a pulmonary infarct, although no definite LEFT lower lobe emboli are seen. No pleural effusions. No suspicious appearing pulmonary nodules or masses.  Upper Abdomen: Visualized portions of the upper abdomen are unremarkable.  Musculoskeletal: No aggressive appearing lytic or blastic lesions are noted in the visualized portions of the skeleton.  Review of the MIP images confirms the above findings.  IMPRESSION: Moderate clot burden in the RIGHT pulmonary artery and its major branches consistent with acute pulmonary emboli. No similar emboli on the LEFT.  CT evidence of right heart strain (RV/LV Ratio = 1.39) consistent with at least submassive (intermediate risk)PE. The presence of right heart strain has been associated with an increased risk of morbidity and  mortality. Consultation with Pulmonary and Critical Care Medicine is recommended.  Nonspecific areas of lower lobe bibasilar subsegmental atelectasis, ground-glass opacities, and peripheral consolidation. See discussion above.  Critical Value/emergent results were called by telephone at the time of interpretation on 05/20/2014 at 9:45 pm to Dr. Pamella Pert , who verbally acknowledged these results.   Electronically Signed   By: Rolla Flatten M.D.   On: 05/20/2014 21:47   Dg Chest Port 1 View  05/20/2014   CLINICAL DATA:  Short of breath, right-sided chest pain some right flank pain  EXAM: PORTABLE CHEST - 1 VIEW  COMPARISON:  Radiograph 09/16/2003  FINDINGS: Normal cardiac silhouette with ectatic aorta. There is left basilar atelectasis. No effusion, infiltrate, pneumothorax. Dedicated view of the right shoulder/right hemi thorax is unremarkable.  IMPRESSION: 1. No evidence of right rib fracture. 2. No thoracic trauma. 3. Left basilar atelectasis versus infiltrate.   Electronically Signed   By: Suzy Bouchard M.D.   On: 05/20/2014 21:13     EKG Interpretation None       Date: 05/21/2014  Rate: 84  Rhythm: normal sinus rhythm  QRS Axis: normal  Intervals: normal  ST/T Wave abnormalities: normal  Conduction Disutrbances:PVC's  Narrative Interpretation: No significant change since last tracing  Old EKG Reviewed: No significant change   CRITICAL CARE Performed by: Pamella Pert, S Total critical care time: 30 min Critical care time was exclusive of separately billable procedures and treating other patients. Critical care was necessary to treat or prevent imminent or life-threatening deterioration. Critical care was time spent personally by me on the following activities: development of treatment plan with patient and/or surrogate as well as nursing, discussions with consultants, evaluation of patient's response to treatment, examination of patient, obtaining history from patient or  surrogate, ordering and performing treatments and interventions, ordering and review of laboratory studies, ordering and review of radiographic studies, pulse oximetry and re-evaluation of patient's condition.  MDM   Final diagnoses:  SOB (shortness of breath)  Shortness of breath  PE (pulmonary embolism)    7:44 PM 80 y.o. male w hx of TIA on aggrenox, HTN who presents with shortness of breath and right-sided chest tightness. He states that he developed the pain while in bed yesterday and it has persisted and gradually worsened since then. He notes that the pain seems to be pleuritic and is worse with deep breathing. He points to his right lower ribs. He is afebrile and tachypneic here. Mildly hypertensive. His symptoms are suspicious for PE. Will get pain control.  Pt found to have PE w/ right heart strain. Will heparinize and admit to hospitalist.     Pamella Pert, MD 05/21/14 [phone]

## 2014-05-20 NOTE — ED Notes (Signed)
Pt in obvious pain upon arrival to room, Dr. Aline Brochure aware.

## 2014-05-21 ENCOUNTER — Encounter (HOSPITAL_COMMUNITY): Payer: Self-pay | Admitting: *Deleted

## 2014-05-21 LAB — CBC
HEMATOCRIT: 44.8 % (ref 39.0–52.0)
Hemoglobin: 14.9 g/dL (ref 13.0–17.0)
MCH: 31.1 pg (ref 26.0–34.0)
MCHC: 33.3 g/dL (ref 30.0–36.0)
MCV: 93.5 fL (ref 78.0–100.0)
PLATELETS: 172 10*3/uL (ref 150–400)
RBC: 4.79 MIL/uL (ref 4.22–5.81)
RDW: 13.6 % (ref 11.5–15.5)
WBC: 7.9 10*3/uL (ref 4.0–10.5)

## 2014-05-21 LAB — HEPARIN LEVEL (UNFRACTIONATED)
Heparin Unfractionated: 0.6 IU/mL (ref 0.30–0.70)
Heparin Unfractionated: 0.35 IU/mL (ref 0.30–0.70)

## 2014-05-21 MED ORDER — HYDROCHLOROTHIAZIDE 25 MG PO TABS
25.0000 mg | ORAL_TABLET | Freq: Every day | ORAL | Status: DC
  Administered 2014-05-21 – 2014-05-22 (×2): 25 mg via ORAL
  Filled 2014-05-21 (×2): qty 1

## 2014-05-21 MED ORDER — SODIUM CHLORIDE 0.9 % IJ SOLN
3.0000 mL | Freq: Two times a day (BID) | INTRAMUSCULAR | Status: DC
  Administered 2014-05-22: 3 mL via INTRAVENOUS

## 2014-05-21 MED ORDER — ASPIRIN-DIPYRIDAMOLE ER 25-200 MG PO CP12
1.0000 | ORAL_CAPSULE | Freq: Every day | ORAL | Status: DC
  Administered 2014-05-21 – 2014-05-22 (×2): 1 via ORAL
  Filled 2014-05-21 (×2): qty 1

## 2014-05-21 MED ORDER — SODIUM CHLORIDE 0.9 % IJ SOLN
3.0000 mL | Freq: Two times a day (BID) | INTRAMUSCULAR | Status: DC
  Administered 2014-05-21 – 2014-05-22 (×2): 3 mL via INTRAVENOUS

## 2014-05-21 MED ORDER — SODIUM CHLORIDE 0.9 % IV SOLN: 250.0000 mL | INTRAVENOUS | Status: DC | PRN

## 2014-05-21 MED ORDER — ALUM & MAG HYDROXIDE-SIMETH 200-200-20 MG/5ML PO SUSP: 30.0000 mL | Freq: Four times a day (QID) | ORAL | Status: DC | PRN

## 2014-05-21 MED ORDER — OXYCODONE HCL 5 MG PO TABS
5.0000 mg | ORAL_TABLET | ORAL | Status: DC | PRN
  Administered 2014-05-21 (×2): 5 mg via ORAL
  Filled 2014-05-21 (×2): qty 1

## 2014-05-21 MED ORDER — ONDANSETRON HCL 4 MG PO TABS: 4.0000 mg | ORAL_TABLET | Freq: Four times a day (QID) | ORAL | Status: DC | PRN

## 2014-05-21 MED ORDER — ACETAMINOPHEN 325 MG PO TABS: 650.0000 mg | ORAL_TABLET | Freq: Four times a day (QID) | ORAL | Status: DC | PRN

## 2014-05-21 MED ORDER — ACETAMINOPHEN 650 MG RE SUPP: 650.0000 mg | Freq: Four times a day (QID) | RECTAL | Status: DC | PRN

## 2014-05-21 MED ORDER — SODIUM CHLORIDE 0.9 % IJ SOLN: 3.0000 mL | INTRAMUSCULAR | Status: DC | PRN

## 2014-05-21 MED ORDER — HYDROMORPHONE HCL 1 MG/ML IJ SOLN: 0.5000 mg | INTRAMUSCULAR | Status: DC | PRN

## 2014-05-21 MED ORDER — ADULT MULTIVITAMIN W/MINERALS CH
1.0000 | ORAL_TABLET | Freq: Every day | ORAL | Status: DC
  Administered 2014-05-21: 1 via ORAL
  Filled 2014-05-21 (×2): qty 1

## 2014-05-21 MED ORDER — ATORVASTATIN CALCIUM 10 MG PO TABS
10.0000 mg | ORAL_TABLET | Freq: Every day | ORAL | Status: DC
  Administered 2014-05-21: 10 mg via ORAL
  Filled 2014-05-21 (×2): qty 1

## 2014-05-21 MED ORDER — ONDANSETRON HCL 4 MG/2ML IJ SOLN: 4.0000 mg | Freq: Four times a day (QID) | INTRAMUSCULAR | Status: DC | PRN

## 2014-05-21 MED ORDER — VITAMIN D 1000 UNITS PO TABS
1000.0000 [IU] | ORAL_TABLET | Freq: Every day | ORAL | Status: DC
  Administered 2014-05-21 – 2014-05-22 (×2): 1000 [IU] via ORAL
  Filled 2014-05-21 (×2): qty 1

## 2014-05-21 NOTE — Progress Notes (Signed)
Utilization Review Completed.Henry Franklin T12/20/2015  

## 2014-05-21 NOTE — Progress Notes (Addendum)
ANTICOAGULATION CONSULT NOTE - Follow Up Consult  Pharmacy Consult for Heparin Indication: pulmonary embolus  No Known Allergies  Patient Measurements: Height: 5\' 8"  (172.7 cm) Weight: 155 lb 6.8 oz (70.5 kg) IBW/kg (Calculated) : 68.4 Heparin Dosing Weight: 70 kg  Vital Signs: Temp: 97.6 F (36.4 C) (12/20 0453) Temp Source: Oral (12/20 0453) BP: 118/66 mmHg (12/20 0453) Pulse Rate: 54 (12/20 0453)  Labs:  Recent Labs  05/20/14 1918 05/21/14 0627  HGB 17.2* 14.9  HCT 51.3 44.8  PLT 190 172  HEPARINUNFRC  --  0.60  CREATININE 1.15  --   TROPONINI <0.30  --     Estimated Creatinine Clearance: 45.4 mL/min (by C-G formula based on Cr of 1.15).   Medications:  Scheduled:  . atorvastatin  10 mg Oral q1800  . cholecalciferol  1,000 Units Oral Daily  . dipyridamole-aspirin  1 capsule Oral Daily  . hydrochlorothiazide  25 mg Oral Daily  . multivitamin with minerals  1 tablet Oral Daily  . sodium chloride  3 mL Intravenous Q12H  . sodium chloride  3 mL Intravenous Q12H   Infusions:  . heparin 1,000 Units/hr (05/20/14 2230)    Assessment: CC: Henry Franklin. is an 80 y.o. male admitted on 05/20/2014 presenting with SOB, CP.  CT angio chest confirmed PE.  Pharmacy was consulted to dose heparin.    PMH: CVA/TIAs on Aggrenox  Anticoagulation: PE - Heparin.  Heparin level is therapeutic at 0.60.  H/H wnl, plt wnl.  No bleeding reported.    Cardiovascular: PE w/ right heart strain (confirmed by CT angio chest: clot burden in right pulmonary artery)- HR 50-60s low, BP within goal, CE neg x1,   Nephrology: Crcl ~40-45 mL/min, lytes wnl.    Goal of Therapy:  Heparin level 0.3-0.7 units/ml Monitor platelets by anticoagulation protocol: Yes   Plan:  - Continue heparin at 1000 units/hr - Follow up heparin level in 8 hours to confirm - Monitor daily CBC - Monitor for sign and symptoms of bleeding   Addendum:  Follow up heparin level is still within goal range but  trending down 0.6>>0.35. No bleeding issues noted. Will increase rate and recheck level in am. Case mgmt to check on insurance coverage for apixaban.  Plan: Increase heparin to 1100 units/hr Daily HL/cbc  Erin Hearing PharmD., BCPS Clinical Pharmacist Pager [phone] 05/21/2014 3:15 PM

## 2014-05-21 NOTE — Progress Notes (Signed)
PROGRESS NOTE  Henry Franklin. VEH:[id number] DOB: 10-26-1926 DOA: 05/20/2014 PCP: Henry Dooms, MD  Assessment/Plan:  Pulmonary embolism IV Heparin Drip -check to see if elquis is covered on insurance and the costs    HTN (hypertension) Continue HCTZ Monitor BPs   Hyperlipidemia Continue Atorvastatin Rx    Spinal stenosis of lumbar region Pain control PRN  PT Eval   Unspecified late effects of cerebrovascular disease On Aggrenox Rx- d/c on eliquis started  Code Status: full Family Communication: patient/brother Disposition Plan:    Consultants:    Procedures:      HPI/Subjective: No SOB, CP improved  Objective: Filed Vitals:   05/21/14 0453  BP: 118/66  Pulse: 54  Temp: 97.6 F (36.4 C)  Resp: 18    Intake/Output Summary (Last 24 hours) at 05/21/14 0927 Last data filed at 05/21/14 0242  Gross per 24 hour  Intake      0 ml  Output    350 ml  Net   -350 ml   Filed Weights   05/20/14 2136 05/20/14 2356  Weight: 71 kg (156 lb 8.4 oz) 70.5 kg (155 lb 6.8 oz)    Exam:   General:  A+Ox3, NAD  Cardiovascular: rrr  Respiratory: clear  Abdomen: +Bs, soft  Musculoskeletal: no edema  Data Reviewed: Basic Metabolic Panel:  Recent Labs Lab 05/20/14 1918  NA 138  K 3.8  CL 97  CO2 28  GLUCOSE 140*  BUN 21  CREATININE 1.15  CALCIUM 9.5   Liver Function Tests:  Recent Labs Lab 05/20/14 1918  AST 24  ALT 22  ALKPHOS 88  BILITOT 0.6  PROT 7.1  ALBUMIN 3.8   No results for input(s): LIPASE, AMYLASE in the last 168 hours. No results for input(s): AMMONIA in the last 168 hours. CBC:  Recent Labs Lab 05/20/14 1918 05/21/14 0627  WBC 11.1* 7.9  HGB 17.2* 14.9  HCT 51.3 44.8  MCV 94.0 93.5  PLT 190 172   Cardiac Enzymes:  Recent Labs Lab 05/20/14 1918    TROPONINI <0.30   BNP (last 3 results)  Recent Labs  05/20/14 1918  PROBNP 123.8   CBG: No results for input(s): GLUCAP in the last 168 hours.  No results found for this or any previous visit (from the past 240 hour(s)).   Studies: Ct Angio Chest Pe W/cm &/or Wo Cm  05/20/2014   CLINICAL DATA:  Right-sided chest pain with shortness of breath that began yesterday. Difficulty taking deep breath. History of prostate cancer.  EXAM: CT ANGIOGRAPHY CHEST WITH CONTRAST  TECHNIQUE: Multidetector CT imaging of the chest was performed using the standard protocol during bolus administration of intravenous contrast. Multiplanar CT image reconstructions and MIPs were obtained to evaluate the vascular anatomy.  CONTRAST:  54mL OMNIPAQUE IOHEXOL 350 MG/ML SOLN  COMPARISON:  None.  FINDINGS: Mediastinum: Multiple filling defects are noted within the RIGHT pulmonary arterial tree, including the RIGHT main pulmonary artery, RIGHT upper lobe vessels, and predominantly RIGHT lower lobe vessels consistent with acute pulmonary emboli. No similar left-sided emboli.  Findings suggesting RIGHT heart strain with abnormally dilated RIGHT ventricle. RV/LV ratio of 1.39 based on widest inner wall measurements of 32 mm RV and 23 mm LV.  No pericardial fluid, thickening or calcification. No acute abnormality of the thoracic aorta or other great vessels of the mediastinum. No pathologically enlarged mediastinal or hilar lymph nodes. The esophagus is normal in appearance.  Lungs/Pleura: Patchy BILATERAL subsegmental atelectasis, ground-glass opacities, and peripheral  consolidation. Particularly on the LEFT, a small wedge-shaped configuration could represent a pulmonary infarct, although no definite LEFT lower lobe emboli are seen. No pleural effusions. No suspicious appearing pulmonary nodules or masses.  Upper Abdomen: Visualized portions of the upper abdomen are unremarkable.  Musculoskeletal: No aggressive appearing lytic or  blastic lesions are noted in the visualized portions of the skeleton.  Review of the MIP images confirms the above findings.  IMPRESSION: Moderate clot burden in the RIGHT pulmonary artery and its major branches consistent with acute pulmonary emboli. No similar emboli on the LEFT.  CT evidence of right heart strain (RV/LV Ratio = 1.39) consistent with at least submassive (intermediate risk)PE. The presence of right heart strain has been associated with an increased risk of morbidity and mortality. Consultation with Pulmonary and Critical Care Medicine is recommended.  Nonspecific areas of lower lobe bibasilar subsegmental atelectasis, ground-glass opacities, and peripheral consolidation. See discussion above.  Critical Value/emergent results were called by telephone at the time of interpretation on 05/20/2014 at 9:45 pm to Dr. Pamella Pert , who verbally acknowledged these results.   Electronically Signed   By: Rolla Flatten M.D.   On: 05/20/2014 21:47   Dg Chest Port 1 View  05/20/2014   CLINICAL DATA:  Short of breath, right-sided chest pain some right flank pain  EXAM: PORTABLE CHEST - 1 VIEW  COMPARISON:  Radiograph 09/16/2003  FINDINGS: Normal cardiac silhouette with ectatic aorta. There is left basilar atelectasis. No effusion, infiltrate, pneumothorax. Dedicated view of the right shoulder/right hemi thorax is unremarkable.  IMPRESSION: 1. No evidence of right rib fracture. 2. No thoracic trauma. 3. Left basilar atelectasis versus infiltrate.   Electronically Signed   By: Suzy Bouchard M.D.   On: 05/20/2014 21:13    Scheduled Meds: . atorvastatin  10 mg Oral q1800  . cholecalciferol  1,000 Units Oral Daily  . dipyridamole-aspirin  1 capsule Oral Daily  . hydrochlorothiazide  25 mg Oral Daily  . multivitamin with minerals  1 tablet Oral Daily  . sodium chloride  3 mL Intravenous Q12H  . sodium chloride  3 mL Intravenous Q12H   Continuous Infusions: . heparin 1,000 Units/hr (05/20/14  2230)   Antibiotics Given (last 72 hours)    None      Principal Problem:   Pulmonary embolism Active Problems:   Hyperlipidemia   Spinal stenosis of lumbar region   HTN (hypertension)   Unspecified late effects of cerebrovascular disease    Time spent: 35 min    Henry Franklin  [facility] Pager [phone]. If 7PM-7AM, please contact night-coverage at [email or web address], password [facility] 05/21/2014, 9:27 AM  LOS: 1 day

## 2014-05-22 LAB — CBC
HCT: 46 % (ref 39.0–52.0)
Hemoglobin: 15.2 g/dL (ref 13.0–17.0)
MCH: 31 pg (ref 26.0–34.0)
MCHC: 33 g/dL (ref 30.0–36.0)
MCV: 93.9 fL (ref 78.0–100.0)
PLATELETS: 182 10*3/uL (ref 150–400)
RBC: 4.9 MIL/uL (ref 4.22–5.81)
RDW: 13.7 % (ref 11.5–15.5)
WBC: 7.6 10*3/uL (ref 4.0–10.5)

## 2014-05-22 LAB — BASIC METABOLIC PANEL
ANION GAP: 8 (ref 5–15)
BUN: 18 mg/dL (ref 6–23)
CALCIUM: 9.1 mg/dL (ref 8.4–10.5)
CHLORIDE: 103 meq/L (ref 96–112)
CO2: 31 meq/L (ref 19–32)
CREATININE: 1.14 mg/dL (ref 0.50–1.35)
GFR calc non Af Amer: 57 mL/min — ABNORMAL LOW (ref >90)
GFR, EST AFRICAN AMERICAN: 66 mL/min — AB (ref >90)
Glucose, Bld: 97 mg/dL (ref 70–99)
Potassium: 4.1 mEq/L (ref 3.7–5.3)
SODIUM: 142 meq/L (ref 137–147)

## 2014-05-22 LAB — HEPARIN LEVEL (UNFRACTIONATED): Heparin Unfractionated: 0.34 IU/mL (ref 0.30–0.70)

## 2014-05-22 MED ORDER — APIXABAN 5 MG PO TABS: 5.0000 mg | ORAL_TABLET | Freq: Two times a day (BID) | ORAL | Status: DC

## 2014-05-22 MED ORDER — NAPHAZOLINE HCL 0.1 % OP SOLN
1.0000 [drp] | Freq: Four times a day (QID) | OPHTHALMIC | Status: DC | PRN
  Filled 2014-05-22: qty 15

## 2014-05-22 MED ORDER — APIXABAN 5 MG PO TABS
10.0000 mg | ORAL_TABLET | Freq: Two times a day (BID) | ORAL | Status: DC
  Administered 2014-05-22: 10 mg via ORAL
  Filled 2014-05-22 (×2): qty 2

## 2014-05-22 MED ORDER — APIXABAN 5 MG PO TABS: 10.0000 mg | ORAL_TABLET | Freq: Two times a day (BID) | ORAL | Status: DC

## 2014-05-22 MED ORDER — ADULT MULTIVITAMIN W/MINERALS CH: 1.0000 | ORAL_TABLET | Freq: Every day | ORAL | Status: DC

## 2014-05-22 NOTE — Care Management (Signed)
Per Insurance:   PCHEKBT 5 MG  OR 10 MG BID  COVER- NOT  PRIOR APPROVAL - YES _ [phone]  [facility] [phone]

## 2014-05-22 NOTE — Discharge Summary (Addendum)
Physician Discharge Summary  [name] KGY:[id number] DOB: [birth year] DOA: 05/20/2014  PCP: Estill Dooms, MD  Admit date: 05/20/2014 Discharge date: 05/22/2014  Time spent: 35 minutes  Recommendations for Outpatient Follow-up:  1. Cbc 1 week 2. Outpatient PT if patient agreeable, cane  Discharge Diagnoses:  Principal Problem:   Pulmonary embolism Active Problems:   Hyperlipidemia   Spinal stenosis of lumbar region   HTN (hypertension)   Unspecified late effects of cerebrovascular disease   Discharge Condition: improved  Diet recommendation: cardiac  Filed Weights   05/20/14 2136 05/20/14 2356  Weight: 71 kg (156 lb 8.4 oz) 70.5 kg (155 lb 6.8 oz)    History of present illness:  Naol Ontiveros. is a 80 y.o. 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 He denies fevers chills cough and hemoptysis. He was found to have a moderate size Pulmonary Embolism on CTA of the Chest. He was started on a Heparin drip.   Hospital Course:  Pulmonary embolism- no further CP -eliquis- prior auth done -patient worked with PT and was steady on his feet  HTN (hypertension) Continue HCTZ    Hyperlipidemia Continue Atorvastatin Rx   Spinal stenosis of lumbar region Pain control PRN PT Eval- patient refused outpatient PT   -cane ordered   Unspecified late effects of cerebrovascular disease On Aggrenox Rx- d/c now that eliquis started  Procedures:    Consultations:    Discharge Exam: Filed Vitals:   05/22/14 0319  BP: 149/69  Pulse: 58  Temp: 98 F (36.7 C)  Resp: 18    General: A+Ox3, NAd Cardiovascular: rrr Respiratory: clear  Discharge Instructions   Discharge Instructions    Diet - low sodium heart  healthy    Complete by:  As directed      Discharge instructions    Complete by:  As directed   Use cane while walking- outpatient PT (declined by patient)     Increase activity slowly    Complete by:  As directed           Current Discharge Medication List    START taking these medications   Details  !! apixaban (ELIQUIS) 5 MG TABS tablet Take 2 tablets (10 mg total) by mouth 2 (two) times daily. Qty: 40 tablet, Refills: 0    !! apixaban (ELIQUIS) 5 MG TABS tablet Take 1 tablet (5 mg total) by mouth 2 (two) times daily. START 12/28 Qty: 60 tablet, Refills: 0     !! - Potential duplicate medications found. Please discuss with provider.    CONTINUE these medications which have NOT CHANGED   Details  atorvastatin (LIPITOR) 10 MG tablet TAKE 1 TABLET (10 MG TOTAL) BY MOUTH DAILY. TO LOWER CHOLESTEROL Qty: 90 tablet, Refills: 3    Cholecalciferol (VITAMIN D-3) 1000 UNITS CAPS Take by mouth. Take one tablet by mouth once daily.    diphenhydrAMINE (SOMINEX) 25 MG tablet Take 25 mg by mouth daily as needed for allergies or sleep.    hydrochlorothiazide (HYDRODIURIL) 25 MG tablet TAKE 1 TABLET BY MOUTH EVERY DAY TO CONTROL BLOOD PRESSURE Qty: 90 tablet, Refills: 3    Multiple Vitamin (MULTIVITAMIN) tablet Take 1 tablet by mouth daily.      STOP taking these medications     dipyridamole-aspirin (AGGRENOX) 200-25 MG per 12 hr capsule      dipyridamole-aspirin (AGGRENOX) 200-25 MG per  12 hr capsule        No Known Allergies    The results of significant diagnostics from this hospitalization (including imaging, microbiology, ancillary and laboratory) are listed below for reference.    Significant Diagnostic Studies: Ct Angio Chest Pe W/cm &/or Wo Cm  05/20/2014   CLINICAL DATA:  Right-sided chest pain with shortness of breath that began yesterday. Difficulty taking deep breath. History of prostate cancer.  EXAM: CT ANGIOGRAPHY CHEST WITH CONTRAST  TECHNIQUE: Multidetector CT  imaging of the chest was performed using the standard protocol during bolus administration of intravenous contrast. Multiplanar CT image reconstructions and MIPs were obtained to evaluate the vascular anatomy.  CONTRAST:  79mL OMNIPAQUE IOHEXOL 350 MG/ML SOLN  COMPARISON:  None.  FINDINGS: Mediastinum: Multiple filling defects are noted within the RIGHT pulmonary arterial tree, including the RIGHT main pulmonary artery, RIGHT upper lobe vessels, and predominantly RIGHT lower lobe vessels consistent with acute pulmonary emboli. No similar left-sided emboli.  Findings suggesting RIGHT heart strain with abnormally dilated RIGHT ventricle. RV/LV ratio of 1.39 based on widest inner wall measurements of 32 mm RV and 23 mm LV.  No pericardial fluid, thickening or calcification. No acute abnormality of the thoracic aorta or other great vessels of the mediastinum. No pathologically enlarged mediastinal or hilar lymph nodes. The esophagus is normal in appearance.  Lungs/Pleura: Patchy BILATERAL subsegmental atelectasis, ground-glass opacities, and peripheral consolidation. Particularly on the LEFT, a small wedge-shaped configuration could represent a pulmonary infarct, although no definite LEFT lower lobe emboli are seen. No pleural effusions. No suspicious appearing pulmonary nodules or masses.  Upper Abdomen: Visualized portions of the upper abdomen are unremarkable.  Musculoskeletal: No aggressive appearing lytic or blastic lesions are noted in the visualized portions of the skeleton.  Review of the MIP images confirms the above findings.  IMPRESSION: Moderate clot burden in the RIGHT pulmonary artery and its major branches consistent with acute pulmonary emboli. No similar emboli on the LEFT.  CT evidence of right heart strain (RV/LV Ratio = 1.39) consistent with at least submassive (intermediate risk)PE. The presence of right heart strain has been associated with an increased risk of morbidity and mortality. Consultation  with Pulmonary and Critical Care Medicine is recommended.  Nonspecific areas of lower lobe bibasilar subsegmental atelectasis, ground-glass opacities, and peripheral consolidation. See discussion above.  Critical Value/emergent results were called by telephone at the time of interpretation on 05/20/2014 at 9:45 pm to Dr. Pamella Pert , who verbally acknowledged these results.   Electronically Signed   By: Rolla Flatten M.D.   On: 05/20/2014 21:47   Dg Chest Port 1 View  05/20/2014   CLINICAL DATA:  Short of breath, right-sided chest pain some right flank pain  EXAM: PORTABLE CHEST - 1 VIEW  COMPARISON:  Radiograph 09/16/2003  FINDINGS: Normal cardiac silhouette with ectatic aorta. There is left basilar atelectasis. No effusion, infiltrate, pneumothorax. Dedicated view of the right shoulder/right hemi thorax is unremarkable.  IMPRESSION: 1. No evidence of right rib fracture. 2. No thoracic trauma. 3. Left basilar atelectasis versus infiltrate.   Electronically Signed   By: Suzy Bouchard M.D.   On: 05/20/2014 21:13    Microbiology: No results found for this or any previous visit (from the past 240 hour(s)).   Labs: Basic Metabolic Panel:  Recent Labs Lab 05/20/14 1918 05/22/14 0535  NA 138 142  K 3.8 4.1  CL 97 103  CO2 28 31  GLUCOSE 140* 97  BUN 21 18  CREATININE  1.15 1.14  CALCIUM 9.5 9.1   Liver Function Tests:  Recent Labs Lab 05/20/14 1918  AST 24  ALT 22  ALKPHOS 88  BILITOT 0.6  PROT 7.1  ALBUMIN 3.8   No results for input(s): LIPASE, AMYLASE in the last 168 hours. No results for input(s): AMMONIA in the last 168 hours. CBC:  Recent Labs Lab 05/20/14 1918 05/21/14 0627 05/22/14 0535  WBC 11.1* 7.9 7.6  HGB 17.2* 14.9 15.2  HCT 51.3 44.8 46.0  MCV 94.0 93.5 93.9  PLT 190 172 182   Cardiac Enzymes:  Recent Labs Lab 05/20/14 1918  TROPONINI <0.30   BNP: BNP (last 3 results)  Recent Labs  05/20/14 1918  PROBNP 123.8   CBG: No results for  input(s): GLUCAP in the last 168 hours.     SignedEulogio Bear  [facility] 05/22/2014, 12:46 PM

## 2014-05-22 NOTE — Evaluation (Signed)
Physical Therapy Evaluation Patient Details Name: Henry Franklin. MRN: [id number] DOB: 08-29-1926 Today's Date: 05/22/2014   History of Present Illness  80 y.o. male admitted with pulmonary embolism. Hx of HTN and spinal stenosis of lumbar region.  Clinical Impression  Pt admitted with above diagnosis. Pt currently with functional limitations due to the deficits listed below (see PT Problem List). Ambulates generally well without an assistive device, demonstrating minor balance deficits, which should be easily improved with the use of a cane for stability. Safely completed stair training today. Pt reports he feels confident with his mobility and will have 24 hour care from his wife as needed. Pt will benefit from skilled PT to increase their independence and safety with mobility to allow discharge to the venue listed below.    Follow Up Recommendations Outpatient PT;Supervision - Intermittent    Equipment Recommendations  Cane    Recommendations for Other Services       Precautions / Restrictions Precautions Precautions: Fall Restrictions Weight Bearing Restrictions: No      Mobility  Bed Mobility Overal bed mobility: Needs Assistance Bed Mobility: Supine to Sit     Supine to sit: Supervision     General bed mobility comments: Supervision for safety. Did not require physical assist  Transfers Overall transfer level: Needs assistance Equipment used: None Transfers: Sit to/from Stand Sit to Stand: Supervision         General transfer comment: Supervision for safety, reaches for furniture upon standing and needs cues for upright posture. Declines use of a RW.  Ambulation/Gait Ambulation/Gait assistance: Min guard Ambulation Distance (Feet): 375 Feet Assistive device: None (Held IV pole initially) Gait Pattern/deviations: Step-through pattern;Decreased stride length;Trunk flexed;Drifts right/left Gait velocity: decreased   General Gait Details: Ambulates slowly  with occasional drift to the right and left. Tends to reach for the wall at times for balance but did not require physical assist. Initially held IV pole but progressed to ambulating without holding this for stability.  Stairs Stairs: Yes Stairs assistance: Supervision Stair Management: One rail Right;Alternating pattern;Forwards Number of Stairs: 5 General stair comments: Supervision for safety with VC for technique and sequencing. Able to perform this task without physical assist.  Wheelchair Mobility    Modified Rankin (Stroke Patients Only)       Balance Overall balance assessment: Needs assistance;History of Falls Sitting-balance support: No upper extremity supported;Feet supported Sitting balance-Leahy Scale: Good     Standing balance support: No upper extremity supported Standing balance-Leahy Scale: Fair                               Pertinent Vitals/Pain Pain Assessment: No/denies pain  Rest SpO2 96% on room air HR 68  Ambulating HR in 80s.  After ambulating SpO2 94-95% on room air [place] expects to be discharged to:: Private residence Living Arrangements: Spouse/significant other Available Help at Discharge: Family;Available 24 hours/day Type of Home: House Home Access: Stairs to enter Entrance Stairs-Rails:  (Middle) Entrance Stairs-Number of Steps: 7 Home Layout: One level Home Equipment: None      Prior Function Level of Independence: Independent               Hand Dominance   Dominant Hand: Right    Extremity/Trunk Assessment   Upper Extremity Assessment: Defer to OT evaluation           Lower Extremity Assessment: Generalized weakness  Communication   Communication: No difficulties  Cognition Arousal/Alertness: Awake/alert Behavior During Therapy: WFL for tasks assessed/performed Overall Cognitive Status: Within Functional Limits for tasks assessed                       General Comments      Exercises        Assessment/Plan    PT Assessment Patient needs continued PT services  PT Diagnosis Difficulty walking;Abnormality of gait;Generalized weakness   PT Problem List Decreased strength;Decreased range of motion;Decreased activity tolerance;Decreased balance;Decreased mobility;Decreased knowledge of use of DME  PT Treatment Interventions DME instruction;Gait training;Stair training;Functional mobility training;Therapeutic activities;Therapeutic exercise;Balance training;Neuromuscular re-education;Patient/family education   PT Goals (Current goals can be found in the Care Plan section) Acute Rehab PT Goals Patient Stated Goal: Go home PT Goal Formulation: With patient Time For Goal Achievement: 06/05/14 Potential to Achieve Goals: Good    Frequency Min 3X/week   Barriers to discharge        Co-evaluation               End of Session Equipment Utilized During Treatment: Gait belt Activity Tolerance: Patient tolerated treatment well Patient left: in chair;with call bell/phone within reach;with family/visitor present Nurse Communication: Mobility status         Time: 8280-0349 PT Time Calculation (min) (ACUTE ONLY): 23 min   Charges:   PT Evaluation $Initial PT Evaluation Tier I: 1 Procedure PT Treatments $Gait Training: 8-22 mins   PT G CodesEllouise Newer 05/22/2014, 9:56 AM Elayne Snare, [place]

## 2014-05-22 NOTE — Discharge Instructions (Signed)
Information on my medicine - ELIQUIS (apixaban)  This medication education was reviewed with me or my healthcare representative as part of my discharge preparation.  The pharmacist that spoke with me during my hospital stay was:  Cristy Friedlander, [facility]  Why was Eliquis prescribed for you? Eliquis was prescribed to treat blood clots that may have been found in the veins of your legs (deep vein thrombosis) or in your lungs (pulmonary embolism) and to reduce the risk of them occurring again.  What do You need to know about Eliquis ? The starting dose is 10 mg (two 5 mg tablets) taken TWICE daily for the FIRST SEVEN (7) DAYS, then on (enter date) Monday 12/28  the dose is reduced to ONE 5 mg tablet taken TWICE daily.  Eliquis may be taken with or without food.   Try to take the dose about the same time in the morning and in the evening. If you have difficulty swallowing the tablet whole please discuss with your pharmacist how to take the medication safely.  Take Eliquis exactly as prescribed and DO NOT stop taking Eliquis without talking to the doctor who prescribed the medication.  Stopping may increase your risk of developing a new blood clot.  Refill your prescription before you run out.  After discharge, you should have regular check-up appointments with your healthcare provider that is prescribing your Eliquis.    What do you do if you miss a dose? If a dose of ELIQUIS is not taken at the scheduled time, take it as soon as possible on the same day and twice-daily administration should be resumed. The dose should not be doubled to make up for a missed dose.  Important Safety Information A possible side effect of Eliquis is bleeding. You should call your healthcare provider right away if you experience any of the following: ? Bleeding from an injury or your nose that does not stop. ? Unusual colored urine (red or dark brown) or unusual colored stools (red or black). ? Unusual bruising  for unknown reasons. ? A serious fall or if you hit your head (even if there is no bleeding).  Some medicines may interact with Eliquis and might increase your risk of bleeding or clotting while on Eliquis. To help avoid this, consult your healthcare provider or pharmacist prior to using any new prescription or non-prescription medications, including herbals, vitamins, non-steroidal anti-inflammatory drugs (NSAIDs) and supplements.  This website has more information on Eliquis (apixaban): http://www.eliquis.com/eliquis/home

## 2014-05-22 NOTE — Progress Notes (Signed)
RN returned call to pt's spouse regarding questions about eliquis dosing. Pt's spouse confirms understanding of medication dosing amounts and times, denies any further questions at this time. RN advised spouse to please call the unit back if she needs further clarification.

## 2014-05-22 NOTE — Progress Notes (Signed)
[facility] CONSULT NOTE - Follow Up Consult  Pharmacy Consult for Heparin Indication: pulmonary embolus  No Known Allergies  Patient Measurements: Height: 5\' 8"  (172.7 cm) Weight: 155 lb 6.8 oz (70.5 kg) IBW/kg (Calculated) : 68.4 Heparin Dosing Weight: 70 kg  Vital Signs: Temp: 98 F (36.7 Franklin) (12/21 0319) Temp Source: Oral (12/21 0319) BP: 149/69 mmHg (12/21 0319) Pulse Rate: 58 (12/21 0319)  Labs:  Recent Labs  05/20/14 1918 05/21/14 0627 05/21/14 1415 05/22/14 0535  HGB 17.2* 14.9  --  15.2  HCT 51.3 44.8  --  46.0  PLT 190 172  --  182  HEPARINUNFRC  --  0.60 0.35 0.34  CREATININE 1.15  --   --  1.14  TROPONINI <0.30  --   --   --     Estimated Creatinine Clearance: 45.8 mL/min (by Franklin-G formula based on Cr of 1.14).   Medications:  Scheduled:  . atorvastatin  10 mg Oral q1800  . cholecalciferol  1,000 Units Oral Daily  . dipyridamole-aspirin  1 capsule Oral Daily  . hydrochlorothiazide  25 mg Oral Daily  . multivitamin with minerals  1 tablet Oral Daily  . [START ON 05/23/2014] multivitamin with minerals  1 tablet Oral Daily  . sodium chloride  3 mL Intravenous Q12H  . sodium chloride  3 mL Intravenous Q12H   Infusions:  . heparin 1,100 Units/hr (05/21/14 2116)    Assessment: CC: Carthel Castille. is an 80 y.o. male admitted on 05/20/2014 presenting with SOB, CP.  CT angio chest confirmed PE.  Pharmacy was consulted to dose heparin.    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  Heparin level is therapeutic.  H/H wnl, plt wnl.  No bleeding reported.    Cardiovascular: PE w/ right heart strain (confirmed by CT angio chest: clot burden in right pulmonary artery)- HR 50-60s low, BP within goal, CE neg x1,   Nephrology: Crcl ~40-45 mL/min, lytes wnl.    Goal of Therapy:  Heparin level 0.3-0.7 units/ml Monitor platelets by Henry protocol: Yes   Plan:  - Continue heparin at 1100 units/hr - Follow up heparin level in 8  hours to confirm - Monitor daily CBC - Monitor for sign and symptoms of bleeding   Addendum:  Follow up heparin level is within goal range 0.34. No bleeding issues noted. Will increase rate and recheck level in am. Case mgmt to check on insurance coverage for apixaban. Needs prior authorization for apixaban Plan: Switch to Apixaban Disontinue heparin   Isac Sarna, BS [place], [place] Clinical Pharmacist Pager [phone] 05/22/2014 10:23 AM

## 2014-05-22 NOTE — Progress Notes (Signed)
Reviewed dc instructions with the patient and his wife, stressed the importance of how to take eliquis and all meds. Patient and wife able to state how to take medication including when the next dose is due. Reinforced follow up appointment and to  increase activity slowly, do not ride his bicycle until seen by pcp, follow up with pcp in 1 to 2 weeks. All instruction given by teachback successful at that time. DC home with written  DC instruction and prescription [facility]

## 2014-05-31 ENCOUNTER — Encounter: Payer: Medicare Other | Admitting: Internal Medicine

## 2014-05-31 ENCOUNTER — Ambulatory Visit (INDEPENDENT_AMBULATORY_CARE_PROVIDER_SITE_OTHER): Payer: Medicare Other | Admitting: Internal Medicine

## 2014-05-31 ENCOUNTER — Encounter: Payer: Self-pay | Admitting: Internal Medicine

## 2014-05-31 VITALS — BP 112/72 | HR 67 | Temp 97.8°F | Resp 18 | Ht 68.0 in | Wt 158.2 lb

## 2014-05-31 DIAGNOSIS — Z8673 Personal history of transient ischemic attack (TIA), and cerebral infarction without residual deficits: Secondary | ICD-10-CM | POA: Insufficient documentation

## 2014-05-31 DIAGNOSIS — Z23 Encounter for immunization: Secondary | ICD-10-CM

## 2014-05-31 DIAGNOSIS — I2699 Other pulmonary embolism without acute cor pulmonale: Secondary | ICD-10-CM

## 2014-05-31 MED ORDER — APIXABAN 5 MG PO TABS: 5.0000 mg | ORAL_TABLET | Freq: Two times a day (BID) | ORAL | Status: DC

## 2014-05-31 NOTE — Progress Notes (Signed)
Patient ID: Henry Atallah., male   DOB: [birth year], 80 y.o.   MRN: [id number]    Facility  [facility]    Place of Service:   OFFICE   No Known Allergies  Chief Complaint  Patient presents with  . Hospitalization Follow-up    HPI:  80 yo male presents today for hospital f/u. He was d/c'd from [facility] on 05/22/14 with dx of moderate PE. He presented to the ER with worsening SOB and pleuritic CP. He has a hx CVA/TIAs and was taking aggrenox. He was tx with heparin drip and transitioned to Eliquis. Aggrenox stopped. Instructed to f/u with PCP and have CBC in 1 week. PT recommended.  He reports SOB resolved. No CP. He is taking eliquis 1 tab BID now. No rectal bleeding, hemoptysis or hematemesis. No easy brusing.   Needs med RF for eliquis    Medications: Patient's Medications  New Prescriptions   No medications on file  Previous Medications   APIXABAN (ELIQUIS) 5 MG TABS TABLET    Take 1 tablet (5 mg total) by mouth 2 (two) times daily. START 12/28   ATORVASTATIN (LIPITOR) 10 MG TABLET    TAKE 1 TABLET (10 MG TOTAL) BY MOUTH DAILY. TO LOWER CHOLESTEROL   CHOLECALCIFEROL (VITAMIN D-3) 1000 UNITS CAPS    Take by mouth. Take one tablet by mouth once daily.   HYDROCHLOROTHIAZIDE (HYDRODIURIL) 25 MG TABLET    TAKE 1 TABLET BY MOUTH EVERY DAY TO CONTROL BLOOD PRESSURE   MULTIPLE VITAMIN (MULTIVITAMIN) TABLET    Take 1 tablet by mouth daily.  Modified Medications   No medications on file  Discontinued Medications   APIXABAN (ELIQUIS) 5 MG TABS TABLET    Take 2 tablets (10 mg total) by mouth 2 (two) times daily.   DIPHENHYDRAMINE (SOMINEX) 25 MG TABLET    Take 25 mg by mouth daily as needed for allergies or sleep.     Review of Systems  As above. All other systems reviewed are negative  Filed Vitals:   05/31/14 1439  BP: 112/72  Pulse: 67  Temp: 97.8 F (36.6 C)  TempSrc: Oral  Resp: 18  Height: 5' 8"  (1.727 m)  Weight: 158 lb 3.2 oz (71.759 kg)  SpO2: 93%   Body mass index  is 24.06 kg/(m^2).  Physical Exam CONSTITUTIONAL: Looks well in NAD. Awake, alert and oriented x 3 HEENT: PERRLA. Oropharynx clear and without exudate NECK: Supple. Nontender. No palpable cervical or supraclavicular lymph nodes. No carotid bruit b/l. No thyromegaly or thyroid mass palpable.  CVS: Regular rate without murmur, gallop or rub. LUNGS: CTA b/l no wheezing, rales or rhonchi. BS reduced on lateral right base ABDOMEN: Bowel sounds present x 4. Soft, nontender, nondistended. No palpable mass or bruit EXTREMITIES: No edema b/l. Distal pulses palpable. No calf tenderness. No palpable cords    Labs reviewed: Admission on 05/20/2014, Discharged on 05/22/2014  Component Date Value Ref Range Status  . WBC 05/20/2014 11.1* 4.0 - 10.5 K/uL Final  . RBC 05/20/2014 5.46  4.22 - 5.81 MIL/uL Final  . Hemoglobin 05/20/2014 17.2* 13.0 - 17.0 g/dL Final  . HCT 05/20/2014 51.3  39.0 - 52.0 % Final  . MCV 05/20/2014 94.0  78.0 - 100.0 fL Final  . MCH 05/20/2014 31.5  26.0 - 34.0 pg Final  . MCHC 05/20/2014 33.5  30.0 - 36.0 g/dL Final  . RDW 05/20/2014 13.7  11.5 - 15.5 % Final  . Platelets 05/20/2014 190  150 - 400  K/uL Final  . Sodium 05/20/2014 138  137 - 147 mEq/L Final  . Potassium 05/20/2014 3.8  3.7 - 5.3 mEq/L Final  . Chloride 05/20/2014 97  96 - 112 mEq/L Final  . CO2 05/20/2014 28  19 - 32 mEq/L Final  . Glucose, Bld 05/20/2014 140* 70 - 99 mg/dL Final  . BUN 05/20/2014 21  6 - 23 mg/dL Final  . Creatinine, Ser 05/20/2014 1.15  0.50 - 1.35 mg/dL Final  . Calcium 05/20/2014 9.5  8.4 - 10.5 mg/dL Final  . GFR calc non Af Amer 05/20/2014 56* >90 mL/min Final  . GFR calc Af Amer 05/20/2014 65* >90 mL/min Final   Comment: (NOTE) The eGFR has been calculated using the CKD EPI equation. This calculation has not been validated in all clinical situations. eGFR's persistently <90 mL/min signify possible Chronic Kidney Disease.   . Anion gap 05/20/2014 13  5 - 15 Final  . Troponin i,  poc 05/20/2014 0.00  0.00 - 0.08 ng/mL Final  . Comment 3 05/20/2014          Final   Comment: Due to the release kinetics of cTnI, a negative result within the first hours of the onset of symptoms does not rule out myocardial infarction with certainty. If myocardial infarction is still suspected, repeat the test at appropriate intervals.   . Pro B Natriuretic peptide (BNP) 05/20/2014 123.8  0 - 450 pg/mL Final  . Total Protein 05/20/2014 7.1  6.0 - 8.3 g/dL Final  . Albumin 05/20/2014 3.8  3.5 - 5.2 g/dL Final  . AST 05/20/2014 24  0 - 37 U/L Final  . ALT 05/20/2014 22  0 - 53 U/L Final  . Alkaline Phosphatase 05/20/2014 88  39 - 117 U/L Final  . Total Bilirubin 05/20/2014 0.6  0.3 - 1.2 mg/dL Final  . Bilirubin, Direct 05/20/2014 <0.2  0.0 - 0.3 mg/dL Final  . Indirect Bilirubin 05/20/2014 NOT CALCULATED  0.3 - 0.9 mg/dL Final  . Troponin I 05/20/2014 <0.30  <0.30 ng/mL Final   Comment:        Due to the release kinetics of cTnI, a negative result within the first hours of the onset of symptoms does not rule out myocardial infarction with certainty. If myocardial infarction is still suspected, repeat the test at appropriate intervals.   . Heparin Unfractionated 05/21/2014 0.60  0.30 - 0.70 IU/mL Final   Comment:        IF HEPARIN RESULTS ARE BELOW EXPECTED VALUES, AND PATIENT DOSAGE HAS BEEN CONFIRMED, SUGGEST FOLLOW UP TESTING OF ANTITHROMBIN III LEVELS.   . WBC 05/21/2014 7.9  4.0 - 10.5 K/uL Final  . RBC 05/21/2014 4.79  4.22 - 5.81 MIL/uL Final  . Hemoglobin 05/21/2014 14.9  13.0 - 17.0 g/dL Final  . HCT 05/21/2014 44.8  39.0 - 52.0 % Final  . MCV 05/21/2014 93.5  78.0 - 100.0 fL Final  . MCH 05/21/2014 31.1  26.0 - 34.0 pg Final  . MCHC 05/21/2014 33.3  30.0 - 36.0 g/dL Final  . RDW 05/21/2014 13.6  11.5 - 15.5 % Final  . Platelets 05/21/2014 172  150 - 400 K/uL Final  . Heparin Unfractionated 05/21/2014 0.35  0.30 - 0.70 IU/mL Final   Comment:        IF HEPARIN  RESULTS ARE BELOW EXPECTED VALUES, AND PATIENT DOSAGE HAS BEEN CONFIRMED, SUGGEST FOLLOW UP TESTING OF ANTITHROMBIN III LEVELS.   [name] Heparin Unfractionated 05/22/2014 0.34  0.30 - 0.70 IU/mL Final  Comment:        IF HEPARIN RESULTS ARE BELOW EXPECTED VALUES, AND PATIENT DOSAGE HAS BEEN CONFIRMED, SUGGEST FOLLOW UP TESTING OF ANTITHROMBIN III LEVELS.   . WBC 05/22/2014 7.6  4.0 - 10.5 K/uL Final  . RBC 05/22/2014 4.90  4.22 - 5.81 MIL/uL Final  . Hemoglobin 05/22/2014 15.2  13.0 - 17.0 g/dL Final  . HCT 05/22/2014 46.0  39.0 - 52.0 % Final  . MCV 05/22/2014 93.9  78.0 - 100.0 fL Final  . MCH 05/22/2014 31.0  26.0 - 34.0 pg Final  . MCHC 05/22/2014 33.0  30.0 - 36.0 g/dL Final  . RDW 05/22/2014 13.7  11.5 - 15.5 % Final  . Platelets 05/22/2014 182  150 - 400 K/uL Final  . Sodium 05/22/2014 142  137 - 147 mEq/L Final  . Potassium 05/22/2014 4.1  3.7 - 5.3 mEq/L Final  . Chloride 05/22/2014 103  96 - 112 mEq/L Final  . CO2 05/22/2014 31  19 - 32 mEq/L Final  . Glucose, Bld 05/22/2014 97  70 - 99 mg/dL Final  . BUN 05/22/2014 18  6 - 23 mg/dL Final  . Creatinine, Ser 05/22/2014 1.14  0.50 - 1.35 mg/dL Final  . Calcium 05/22/2014 9.1  8.4 - 10.5 mg/dL Final  . GFR calc non Af Amer 05/22/2014 57* >90 mL/min Final  . GFR calc Af Amer 05/22/2014 66* >90 mL/min Final   Comment: (NOTE) The eGFR has been calculated using the CKD EPI equation. This calculation has not been validated in all clinical situations. eGFR's persistently <90 mL/min signify possible Chronic Kidney Disease.   . Anion gap 05/22/2014 8  5 - 15 Final   IMAGING  CLINICAL DATA: Right-sided chest pain with shortness of breath that began yesterday. Difficulty taking deep breath. History of prostate cancer.  EXAM: CT ANGIOGRAPHY CHEST WITH CONTRAST  TECHNIQUE: Multidetector CT imaging of the chest was performed using the standard protocol during bolus administration of intravenous contrast. Multiplanar CT  image reconstructions and MIPs were obtained to evaluate the vascular anatomy.  CONTRAST: 25m OMNIPAQUE IOHEXOL 350 MG/ML SOLN  COMPARISON: None.  FINDINGS: Mediastinum: Multiple filling defects are noted within the RIGHT pulmonary arterial tree, including the RIGHT main pulmonary artery, RIGHT upper lobe vessels, and predominantly RIGHT lower lobe vessels consistent with acute pulmonary emboli. No similar left-sided emboli.  Findings suggesting RIGHT heart strain with abnormally dilated RIGHT ventricle. RV/LV ratio of 1.39 based on widest inner wall measurements of 32 mm RV and 23 mm LV.  No pericardial fluid, thickening or calcification. No acute abnormality of the thoracic aorta or other great vessels of the mediastinum. No pathologically enlarged mediastinal or hilar lymph nodes. The esophagus is normal in appearance.  Lungs/Pleura: Patchy BILATERAL subsegmental atelectasis, ground-glass opacities, and peripheral consolidation. Particularly on the LEFT, a small wedge-shaped configuration could represent a pulmonary infarct, although no definite LEFT lower lobe emboli are seen. No pleural effusions. No suspicious appearing pulmonary nodules or masses.  Upper Abdomen: Visualized portions of the upper abdomen are unremarkable.  Musculoskeletal: No aggressive appearing lytic or blastic lesions are noted in the visualized portions of the skeleton.  Review of the MIP images confirms the above findings.  IMPRESSION: Moderate clot burden in the RIGHT pulmonary artery and its major branches consistent with acute pulmonary emboli. No similar emboli on the LEFT.  CT evidence of right heart strain (RV/LV Ratio = 1.39) consistent with at least submassive (intermediate risk)PE. The presence of right heart strain has been associated  with an increased risk of morbidity and mortality. Consultation with Pulmonary and Critical Care Medicine is  recommended.  Nonspecific areas of lower lobe bibasilar subsegmental atelectasis, ground-glass opacities, and peripheral consolidation. See discussion above.  Critical Value/emergent results were called by telephone at the time of interpretation on 05/20/2014 at 9:45 pm to Dr. Pamella Pert , who verbally acknowledged these results.   Electronically Signed  By: Rolla Flatten M.D.  On: 05/20/2014 21:47  Assessment/Plan    ICD-9-CM ICD-10-CM   1. Acute pulmonary embolism- stable on eleiquis. New rx written today. May resume nml activity next week. 415.19 I26.99 apixaban (ELIQUIS) 5 MG TABS tablet  2. History of CVA (cerebrovascular accident)- aggrenox stopped due to acute PE. Now on eliquis V12.54 Z86.73 apixaban (ELIQUIS) 5 MG TABS tablet   - keep appt with Dr Nyoka Cowden in Feb 2016 - Will likely require 3-6 mos of tx with eliquis  - administer Unity. Perlie Gold  [facility] [address] [place], [place] [address] [phone] Office (Wednesdays and Fridays 8 AM - 5 PM) [phone] Cell (Monday-Friday 8 AM - 5 PM)

## 2014-05-31 NOTE — Patient Instructions (Addendum)
May resume exercise activity in 1 week F/u with Dr Nyoka Cowden in Feb 2016

## 2014-07-03 ENCOUNTER — Other Ambulatory Visit: Payer: PPO

## 2014-07-03 DIAGNOSIS — C61 Malignant neoplasm of prostate: Secondary | ICD-10-CM

## 2014-07-03 DIAGNOSIS — G252 Other specified forms of tremor: Secondary | ICD-10-CM

## 2014-07-03 DIAGNOSIS — G250 Essential tremor: Secondary | ICD-10-CM

## 2014-07-03 DIAGNOSIS — E785 Hyperlipidemia, unspecified: Secondary | ICD-10-CM

## 2014-07-04 ENCOUNTER — Encounter: Payer: Self-pay | Admitting: Internal Medicine

## 2014-07-04 ENCOUNTER — Ambulatory Visit (INDEPENDENT_AMBULATORY_CARE_PROVIDER_SITE_OTHER): Payer: PPO | Admitting: Internal Medicine

## 2014-07-04 VITALS — BP 126/80 | HR 54 | Temp 97.6°F | Resp 10 | Ht 68.0 in | Wt 158.0 lb

## 2014-07-04 DIAGNOSIS — G252 Other specified forms of tremor: Secondary | ICD-10-CM

## 2014-07-04 DIAGNOSIS — I10 Essential (primary) hypertension: Secondary | ICD-10-CM | POA: Diagnosis not present

## 2014-07-04 DIAGNOSIS — C61 Malignant neoplasm of prostate: Secondary | ICD-10-CM | POA: Diagnosis not present

## 2014-07-04 DIAGNOSIS — I2699 Other pulmonary embolism without acute cor pulmonale: Secondary | ICD-10-CM | POA: Diagnosis not present

## 2014-07-04 DIAGNOSIS — R251 Tremor, unspecified: Secondary | ICD-10-CM | POA: Diagnosis not present

## 2014-07-04 DIAGNOSIS — M4806 Spinal stenosis, lumbar region: Secondary | ICD-10-CM | POA: Diagnosis not present

## 2014-07-04 DIAGNOSIS — G250 Essential tremor: Secondary | ICD-10-CM

## 2014-07-04 DIAGNOSIS — M48061 Spinal stenosis, lumbar region without neurogenic claudication: Secondary | ICD-10-CM

## 2014-07-04 DIAGNOSIS — E785 Hyperlipidemia, unspecified: Secondary | ICD-10-CM

## 2014-07-04 LAB — LIPID PANEL
CHOL/HDL RATIO: 3.3 ratio (ref 0.0–5.0)
Cholesterol, Total: 174 mg/dL (ref 100–199)
HDL: 52 mg/dL (ref >39)
LDL Calculated: 100 mg/dL — ABNORMAL HIGH (ref 0–99)
Triglycerides: 111 mg/dL (ref 0–149)
VLDL CHOLESTEROL CAL: 22 mg/dL (ref 5–40)

## 2014-07-04 LAB — COMPREHENSIVE METABOLIC PANEL
A/G RATIO: 1.8 (ref 1.1–2.5)
ALT: 24 IU/L (ref 0–44)
AST: 27 IU/L (ref 0–40)
Albumin: 4.1 g/dL (ref 3.5–4.7)
Alkaline Phosphatase: 74 IU/L (ref 39–117)
BUN/Creatinine Ratio: 15 (ref 10–22)
BUN: 16 mg/dL (ref 8–27)
CO2: 24 mmol/L (ref 18–29)
Calcium: 9.7 mg/dL (ref 8.6–10.2)
Chloride: 100 mmol/L (ref 97–108)
Creatinine, Ser: 1.1 mg/dL (ref 0.76–1.27)
GFR calc non Af Amer: 61 mL/min/{1.73_m2} (ref >59)
GFR, EST AFRICAN AMERICAN: 70 mL/min/{1.73_m2} (ref >59)
GLOBULIN, TOTAL: 2.3 g/dL (ref 1.5–4.5)
Glucose: 101 mg/dL — ABNORMAL HIGH (ref 65–99)
POTASSIUM: 4 mmol/L (ref 3.5–5.2)
Sodium: 143 mmol/L (ref 134–144)
Total Bilirubin: 0.8 mg/dL (ref 0.0–1.2)
Total Protein: 6.4 g/dL (ref 6.0–8.5)

## 2014-07-04 LAB — PSA

## 2014-07-04 NOTE — Progress Notes (Signed)
Patient ID: Henry Lusk., male   DOB: 11-Jan-1927, 81 y.o.   MRN: [id number]    Facility  [facility]    Place of Service:   OFFICE   No Known Allergies  Chief Complaint  Patient presents with  . Medical Management of Chronic Issues    6 month follow-up, discuss labs (copy printed)     HPI:  Hyperlipidemia: Controlled  Essential and other specified forms of tremor: Mild tremor in the right hand unchanged from previous visits. Does not interfere with ADL.   Spinal stenosis of lumbar region: Chronic low back discomfort. Some difficulty with balance.   Pulmonary embolism - hospitalized in December 2015 for this problem. Patient appears to have recovered completely. He is on apixaban.   Prostate cancer: No evidence for relapse. PSA is quite low.   Essential hypertension: Controlled    Medications: Patient's Medications  New Prescriptions   No medications on file  Previous Medications   APIXABAN (ELIQUIS) 5 MG TABS TABLET    Take 1 tablet (5 mg total) by mouth 2 (two) times daily. START 12/28   ATORVASTATIN (LIPITOR) 10 MG TABLET    TAKE 1 TABLET (10 MG TOTAL) BY MOUTH DAILY. TO LOWER CHOLESTEROL   CHOLECALCIFEROL (VITAMIN D-3) 1000 UNITS CAPS    Take by mouth. Take one tablet by mouth once daily.   HYDROCHLOROTHIAZIDE (HYDRODIURIL) 25 MG TABLET    TAKE 1 TABLET BY MOUTH EVERY DAY TO CONTROL BLOOD PRESSURE   MULTIPLE VITAMIN (MULTIVITAMIN) TABLET    Take 1 tablet by mouth daily.  Modified Medications   No medications on file  Discontinued Medications   No medications on file     Review of Systems  Constitutional: Negative.   HENT: Negative.   Eyes: Negative.   Respiratory: Negative.        History of pulmonary embolism December 2015.   Cardiovascular: Negative.   Gastrointestinal: Negative.   Genitourinary: Negative.   Musculoskeletal: Positive for myalgias, back pain and gait problem.  Skin: Negative.   Neurological:       CVA in 2005. Numbness. Mild tremor right  hand and arm. History of falls while playing tennis.  Hematological: Negative.   Psychiatric/Behavioral: Negative.     Filed Vitals:   07/04/14 1543  BP: 126/80  Pulse: 54  Temp: 97.6 F (36.4 C)  TempSrc: Oral  Resp: 10  Height: 5' 8" (1.727 m)  Weight: 158 lb (71.668 kg)  SpO2: 98%   Body mass index is 24.03 kg/(m^2).  Physical Exam  Constitutional: He is oriented to person, place, and time. He appears well-developed and well-nourished. No distress.  HENT:  Head: Atraumatic.  Mild loss of hearing.  Eyes: Conjunctivae and EOM are normal. Pupils are equal, round, and reactive to light.  Neck: Normal range of motion. Neck supple. No JVD present. No tracheal deviation present. No thyromegaly present.  Cardiovascular: Normal rate, regular rhythm, normal heart sounds and intact distal pulses.  Exam reveals no gallop and no friction rub.   No murmur heard. Pulmonary/Chest: Effort normal and breath sounds normal. No respiratory distress. He has no wheezes. He has no rales.  Abdominal: Soft. Bowel sounds are normal. He exhibits no distension and no mass. There is no tenderness.  Musculoskeletal: Normal range of motion. He exhibits tenderness. He exhibits no edema.  Low back paraspinal muscular  discomfort with flexion,, ext., and rotational movements.  Lymphadenopathy:    He has no cervical adenopathy.  Neurological: He is alert and oriented  to person, place, and time. No cranial nerve deficit. Coordination normal.  Reduced vibratory sensation in both feet. 04/05/14 MMSE 29/30. Passed clock drawing.  Skin: Skin is warm. No rash noted. No erythema. No pallor.  .Area of the left great toe has a a surface that appears to be a healed area of a previous ingrown toenail.  Psychiatric: He has a normal mood and affect. His behavior is normal. Thought content normal.     Labs reviewed: Appointment on 07/03/2014  Component Date Value Ref Range Status  . PSA 07/03/2014 <0.1  0.0 - 4.0  ng/mL Final   Comment: Roche ECLIA methodology. According to the American Urological Association, Serum PSA should decrease and remain at undetectable levels after radical prostatectomy. The AUA defines biochemical recurrence as an initial PSA value 0.2 ng/mL or greater followed by a subsequent confirmatory PSA value 0.2 ng/mL or greater. Values obtained with different assay methods or kits cannot be used interchangeably. Results cannot be interpreted as absolute evidence of the presence or absence of malignant disease.   . Cholesterol, Total 07/03/2014 174  100 - 199 mg/dL Final  . Triglycerides 07/03/2014 111  0 - 149 mg/dL Final  . HDL 07/03/2014 52  >39 mg/dL Final   Comment: According to ATP-III Guidelines, HDL-C >59 mg/dL is considered a negative risk factor for CHD.   [name] VLDL Cholesterol Cal 07/03/2014 22  5 - 40 mg/dL Final  . LDL Calculated 07/03/2014 100* 0 - 99 mg/dL Final  . Chol/HDL Ratio 07/03/2014 3.3  0.0 - 5.0 ratio units Final   Comment:                                   T. Chol/HDL Ratio                                             Men  Women                               1/2 Avg.Risk  3.4    3.3                                   Avg.Risk  5.0    4.4                                2X Avg.Risk  9.6    7.1                                3X Avg.Risk 23.4   11.0   . Glucose 07/03/2014 101* 65 - 99 mg/dL Final  . BUN 07/03/2014 16  8 - 27 mg/dL Final  . Creatinine, Ser 07/03/2014 1.10  0.76 - 1.27 mg/dL Final  . GFR calc non Af Amer 07/03/2014 61  >59 mL/min/1.73 Final  . GFR calc Af Amer 07/03/2014 70  >59 mL/min/1.73 Final  . BUN/Creatinine Ratio 07/03/2014 15  10 - 22 Final  . Sodium 07/03/2014 143  134 - 144 mmol/L Final  . Potassium 07/03/2014 4.0  3.5 - 5.2  mmol/L Final  . Chloride 07/03/2014 100  97 - 108 mmol/L Final  . CO2 07/03/2014 24  18 - 29 mmol/L Final  . Calcium 07/03/2014 9.7  8.6 - 10.2 mg/dL Final  . Total Protein 07/03/2014 6.4  6.0 - 8.5 g/dL  Final  . Albumin 07/03/2014 4.1  3.5 - 4.7 g/dL Final  . Globulin, Total 07/03/2014 2.3  1.5 - 4.5 g/dL Final  . Albumin/Globulin Ratio 07/03/2014 1.8  1.1 - 2.5 Final  . Total Bilirubin 07/03/2014 0.8  0.0 - 1.2 mg/dL Final  . Alkaline Phosphatase 07/03/2014 74  39 - 117 IU/L Final  . AST 07/03/2014 27  0 - 40 IU/L Final  . ALT 07/03/2014 24  0 - 44 IU/L Final  Admission on 05/20/2014, Discharged on 05/22/2014  Component Date Value Ref Range Status  . WBC 05/20/2014 11.1* 4.0 - 10.5 K/uL Final  . RBC 05/20/2014 5.46  4.22 - 5.81 MIL/uL Final  . Hemoglobin 05/20/2014 17.2* 13.0 - 17.0 g/dL Final  . HCT 05/20/2014 51.3  39.0 - 52.0 % Final  . MCV 05/20/2014 94.0  78.0 - 100.0 fL Final  . MCH 05/20/2014 31.5  26.0 - 34.0 pg Final  . MCHC 05/20/2014 33.5  30.0 - 36.0 g/dL Final  . RDW 05/20/2014 13.7  11.5 - 15.5 % Final  . Platelets 05/20/2014 190  150 - 400 K/uL Final  . Sodium 05/20/2014 138  137 - 147 mEq/L Final  . Potassium 05/20/2014 3.8  3.7 - 5.3 mEq/L Final  . Chloride 05/20/2014 97  96 - 112 mEq/L Final  . CO2 05/20/2014 28  19 - 32 mEq/L Final  . Glucose, Bld 05/20/2014 140* 70 - 99 mg/dL Final  . BUN 05/20/2014 21  6 - 23 mg/dL Final  . Creatinine, Ser 05/20/2014 1.15  0.50 - 1.35 mg/dL Final  . Calcium 05/20/2014 9.5  8.4 - 10.5 mg/dL Final  . GFR calc non Af Amer 05/20/2014 56* >90 mL/min Final  . GFR calc Af Amer 05/20/2014 65* >90 mL/min Final   Comment: (NOTE) The eGFR has been calculated using the CKD EPI equation. This calculation has not been validated in all clinical situations. eGFR's persistently <90 mL/min signify possible Chronic Kidney Disease.   . Anion gap 05/20/2014 13  5 - 15 Final  . Troponin i, poc 05/20/2014 0.00  0.00 - 0.08 ng/mL Final  . Comment 3 05/20/2014          Final   Comment: Due to the release kinetics of cTnI, a negative result within the first hours of the onset of symptoms does not rule out myocardial infarction with  certainty. If myocardial infarction is still suspected, repeat the test at appropriate intervals.   . Pro B Natriuretic peptide (BNP) 05/20/2014 123.8  0 - 450 pg/mL Final  . Total Protein 05/20/2014 7.1  6.0 - 8.3 g/dL Final  . Albumin 05/20/2014 3.8  3.5 - 5.2 g/dL Final  . AST 05/20/2014 24  0 - 37 U/L Final  . ALT 05/20/2014 22  0 - 53 U/L Final  . Alkaline Phosphatase 05/20/2014 88  39 - 117 U/L Final  . Total Bilirubin 05/20/2014 0.6  0.3 - 1.2 mg/dL Final  . Bilirubin, Direct 05/20/2014 <0.2  0.0 - 0.3 mg/dL Final  . Indirect Bilirubin 05/20/2014 NOT CALCULATED  0.3 - 0.9 mg/dL Final  . Troponin I 05/20/2014 <0.30  <0.30 ng/mL Final   Comment:        Due to the  release kinetics of cTnI, a negative result within the first hours of the onset of symptoms does not rule out myocardial infarction with certainty. If myocardial infarction is still suspected, repeat the test at appropriate intervals.   . Heparin Unfractionated 05/21/2014 0.60  0.30 - 0.70 IU/mL Final   Comment:        IF HEPARIN RESULTS ARE BELOW EXPECTED VALUES, AND PATIENT DOSAGE HAS BEEN CONFIRMED, SUGGEST FOLLOW UP TESTING OF ANTITHROMBIN III LEVELS.   . WBC 05/21/2014 7.9  4.0 - 10.5 K/uL Final  . RBC 05/21/2014 4.79  4.22 - 5.81 MIL/uL Final  . Hemoglobin 05/21/2014 14.9  13.0 - 17.0 g/dL Final  . HCT 05/21/2014 44.8  39.0 - 52.0 % Final  . MCV 05/21/2014 93.5  78.0 - 100.0 fL Final  . MCH 05/21/2014 31.1  26.0 - 34.0 pg Final  . MCHC 05/21/2014 33.3  30.0 - 36.0 g/dL Final  . RDW 05/21/2014 13.6  11.5 - 15.5 % Final  . Platelets 05/21/2014 172  150 - 400 K/uL Final  . Heparin Unfractionated 05/21/2014 0.35  0.30 - 0.70 IU/mL Final   Comment:        IF HEPARIN RESULTS ARE BELOW EXPECTED VALUES, AND PATIENT DOSAGE HAS BEEN CONFIRMED, SUGGEST FOLLOW UP TESTING OF ANTITHROMBIN III LEVELS.   [name] Heparin Unfractionated 05/22/2014 0.34  0.30 - 0.70 IU/mL Final   Comment:        IF HEPARIN RESULTS ARE  BELOW EXPECTED VALUES, AND PATIENT DOSAGE HAS BEEN CONFIRMED, SUGGEST FOLLOW UP TESTING OF ANTITHROMBIN III LEVELS.   . WBC 05/22/2014 7.6  4.0 - 10.5 K/uL Final  . RBC 05/22/2014 4.90  4.22 - 5.81 MIL/uL Final  . Hemoglobin 05/22/2014 15.2  13.0 - 17.0 g/dL Final  . HCT 05/22/2014 46.0  39.0 - 52.0 % Final  . MCV 05/22/2014 93.9  78.0 - 100.0 fL Final  . MCH 05/22/2014 31.0  26.0 - 34.0 pg Final  . MCHC 05/22/2014 33.0  30.0 - 36.0 g/dL Final  . RDW 05/22/2014 13.7  11.5 - 15.5 % Final  . Platelets 05/22/2014 182  150 - 400 K/uL Final  . Sodium 05/22/2014 142  137 - 147 mEq/L Final  . Potassium 05/22/2014 4.1  3.7 - 5.3 mEq/L Final  . Chloride 05/22/2014 103  96 - 112 mEq/L Final  . CO2 05/22/2014 31  19 - 32 mEq/L Final  . Glucose, Bld 05/22/2014 97  70 - 99 mg/dL Final  . BUN 05/22/2014 18  6 - 23 mg/dL Final  . Creatinine, Ser 05/22/2014 1.14  0.50 - 1.35 mg/dL Final  . Calcium 05/22/2014 9.1  8.4 - 10.5 mg/dL Final  . GFR calc non Af Amer 05/22/2014 57* >90 mL/min Final  . GFR calc Af Amer 05/22/2014 66* >90 mL/min Final   Comment: (NOTE) The eGFR has been calculated using the CKD EPI equation. This calculation has not been validated in all clinical situations. eGFR's persistently <90 mL/min signify possible Chronic Kidney Disease.   . Anion gap 05/22/2014 8  5 - 15 Final     Assessment/Plan 1. Hyperlipidemia Controlled  2. Essential and other specified forms of tremor Dictated to observe. No medications ordered.  3. Spinal stenosis of lumbar region Chronic discomfort  4. Pulmonary embolism Screening tests for hypercoagulability did not appear to have been done during the hospital stay. - Factor 5 leiden - Protein C activity - Homocysteine - Protein S activity - Antithrombin panel  5. Prostate cancer No evidence  for relapse  6. Essential hypertension Controlled

## 2014-10-30 ENCOUNTER — Other Ambulatory Visit: Payer: Self-pay | Admitting: Internal Medicine

## 2014-12-20 ENCOUNTER — Telehealth: Payer: Self-pay | Admitting: *Deleted

## 2014-12-20 NOTE — Telephone Encounter (Signed)
Called patient to schedule appointment per Dr. Nyoka Cowden , he stated that his wife was out of town and he was unable to schedule anything at this time. Ask that we  please call back next week when his wife returns.

## 2014-12-25 ENCOUNTER — Other Ambulatory Visit: Payer: PPO

## 2014-12-26 ENCOUNTER — Other Ambulatory Visit: Payer: Self-pay

## 2014-12-26 DIAGNOSIS — I2699 Other pulmonary embolism without acute cor pulmonale: Secondary | ICD-10-CM

## 2014-12-27 ENCOUNTER — Other Ambulatory Visit: Payer: PPO

## 2014-12-27 ENCOUNTER — Telehealth: Payer: Self-pay

## 2014-12-27 DIAGNOSIS — I2699 Other pulmonary embolism without acute cor pulmonale: Secondary | ICD-10-CM

## 2014-12-27 NOTE — Telephone Encounter (Signed)
Discussed with patient, patient already took medication today. Patient will d/c medication on Saturday and Sunday. Patient will come in on Monday for fasting lab draw. Patient was uneasy about discontinuing medication and asked that I run this by Dr.Green 1 more time to assure him that it is ok to d/c medication

## 2014-12-27 NOTE — Telephone Encounter (Signed)
I would like to proceed with the tests. Please call patient and have him discontinue Eliquis for 48 hours prior to the blood draw.

## 2014-12-27 NOTE — Telephone Encounter (Signed)
Dee (Gaffer) called [employer] [facility] [facility] to discuss ordered coagulation studies. Patient will need to be off Eliquis x 48 hours prior to having coagulation test performed for accuracy. Please advise if you would like to proceed with test and have patient d/c Eliquis x 48 hours.

## 2014-12-27 NOTE — Telephone Encounter (Signed)
OK to stop medication for 48 hours prior to lab.

## 2014-12-28 NOTE — Telephone Encounter (Signed)
Patient notified and agreed.  

## 2015-01-01 ENCOUNTER — Other Ambulatory Visit: Payer: Self-pay

## 2015-01-01 ENCOUNTER — Other Ambulatory Visit: Payer: PPO

## 2015-01-01 DIAGNOSIS — I2699 Other pulmonary embolism without acute cor pulmonale: Secondary | ICD-10-CM

## 2015-01-02 ENCOUNTER — Ambulatory Visit (INDEPENDENT_AMBULATORY_CARE_PROVIDER_SITE_OTHER): Payer: PPO | Admitting: Internal Medicine

## 2015-01-02 ENCOUNTER — Encounter: Payer: Self-pay | Admitting: Internal Medicine

## 2015-01-02 VITALS — BP 108/68 | HR 50 | Temp 97.9°F | Resp 20 | Ht 68.0 in | Wt 153.4 lb

## 2015-01-02 DIAGNOSIS — R251 Tremor, unspecified: Secondary | ICD-10-CM | POA: Diagnosis not present

## 2015-01-02 DIAGNOSIS — E785 Hyperlipidemia, unspecified: Secondary | ICD-10-CM | POA: Diagnosis not present

## 2015-01-02 DIAGNOSIS — I10 Essential (primary) hypertension: Secondary | ICD-10-CM | POA: Diagnosis not present

## 2015-01-02 DIAGNOSIS — I2699 Other pulmonary embolism without acute cor pulmonale: Secondary | ICD-10-CM

## 2015-01-02 DIAGNOSIS — L299 Pruritus, unspecified: Secondary | ICD-10-CM | POA: Diagnosis not present

## 2015-01-02 DIAGNOSIS — G252 Other specified forms of tremor: Secondary | ICD-10-CM

## 2015-01-02 DIAGNOSIS — G250 Essential tremor: Secondary | ICD-10-CM

## 2015-01-02 NOTE — Progress Notes (Signed)
Patient ID: Henry Franklin., male   DOB: September 26, 1926, 81 y.o.   MRN: [id number]    Facility  [facility]    Place of Service:   OFFICE    No Known Allergies  Chief Complaint  Patient presents with  . Medical Management of Chronic Issues    Medication management    HPI:  Pruritus: Generalized. Also with some hayfever.  Essential and other specified forms of tremor - unchanged  Essential hypertension - controlled  Hyperlipidemia - no recent lab  Pulmonary embolism - continues on Eliquis. Denies dyspnea or chest discomfort. Lab work yesterday looking for hypercoagulable state. Mother died of a embolus. Patient is unsure whether it was a pulmonary embolus or something else. This patient has had both pulmonary embolism as well as a TIA/CVA in 2005.    Medications: Patient's Medications  New Prescriptions   No medications on file  Previous Medications   APIXABAN (ELIQUIS) 5 MG TABS TABLET    Take 1 tablet (5 mg total) by mouth 2 (two) times daily. START 12/28   ATORVASTATIN (LIPITOR) 10 MG TABLET    TAKE 1 TABLET (10 MG TOTAL) BY MOUTH DAILY. TO LOWER CHOLESTEROL   CHOLECALCIFEROL (VITAMIN D-3) 1000 UNITS CAPS    Take by mouth. Take one tablet by mouth once daily.   HYDROCHLOROTHIAZIDE (HYDRODIURIL) 25 MG TABLET    TAKE 1 TABLET BY MOUTH EVERY DAY TO CONTROL BLOOD PRESSURE   MULTIPLE VITAMIN (MULTIVITAMIN) TABLET    Take 1 tablet by mouth daily.  Modified Medications   No medications on file  Discontinued Medications   ATORVASTATIN (LIPITOR) 10 MG TABLET    TAKE 1 TABLET (10 MG TOTAL) BY MOUTH DAILY. TO LOWER CHOLESTEROL     Review of Systems  Constitutional: Negative.   HENT: Negative.   Eyes: Negative.   Respiratory: Negative.        History of pulmonary embolism December 2015.   Cardiovascular: Negative.   Gastrointestinal: Negative.   Genitourinary: Negative.   Musculoskeletal: Positive for myalgias, back pain and gait problem.  Skin: Negative.   Neurological:   CVA in 2005. Numbness. Mild tremor right hand and arm. History of falls while playing tennis.  Hematological: Negative.   Psychiatric/Behavioral: Negative.     Filed Vitals:   01/02/15 1405  BP: 108/68  Pulse: 50  Temp: 97.9 F (36.6 C)  TempSrc: Oral  Resp: 20  Height: 5\' 8"  (1.727 m)  Weight: 153 lb 6.4 oz (69.582 kg)  SpO2: 97%   Body mass index is 23.33 kg/(m^2).  Physical Exam  Constitutional: He is oriented to person, place, and time. He appears well-developed and well-nourished. No distress.  HENT:  Head: Atraumatic.  Mild loss of hearing.  Eyes: Conjunctivae and EOM are normal. Pupils are equal, round, and reactive to light.  Neck: Normal range of motion. Neck supple. No JVD present. No tracheal deviation present. No thyromegaly present.  Cardiovascular: Normal rate, regular rhythm, normal heart sounds and intact distal pulses.  Exam reveals no gallop and no friction rub.   No murmur heard. Pulmonary/Chest: Effort normal and breath sounds normal. No respiratory distress. He has no wheezes. He has no rales.  Abdominal: Soft. Bowel sounds are normal. He exhibits no distension and no mass. There is no tenderness.  Musculoskeletal: Normal range of motion. He exhibits tenderness. He exhibits no edema.  Low back paraspinal muscular  discomfort with flexion,, ext., and rotational movements.  Lymphadenopathy:    He has no cervical adenopathy.  Neurological: He is alert and oriented to person, place, and time. No cranial nerve deficit. Coordination normal.  Reduced vibratory sensation in both feet. 04/05/14 MMSE 29/30. Passed clock drawing.  Skin: Skin is warm. No rash noted. No erythema. No pallor.  Psychiatric: He has a normal mood and affect. His behavior is normal. Thought content normal.     Labs reviewed: No visits with results within 3 Month(s) from this visit. Latest known visit with results is:  Appointment on 07/03/2014  Component Date Value Ref Range Status    . PSA 07/03/2014 <0.1  0.0 - 4.0 ng/mL Final   Comment: Roche ECLIA methodology. According to the American Urological Association, Serum PSA should decrease and remain at undetectable levels after radical prostatectomy. The AUA defines biochemical recurrence as an initial PSA value 0.2 ng/mL or greater followed by a subsequent confirmatory PSA value 0.2 ng/mL or greater. Values obtained with different assay methods or kits cannot be used interchangeably. Results cannot be interpreted as absolute evidence of the presence or absence of malignant disease.   . Cholesterol, Total 07/03/2014 174  100 - 199 mg/dL Final  . Triglycerides 07/03/2014 111  0 - 149 mg/dL Final  . HDL 07/03/2014 52  >39 mg/dL Final   Comment: According to ATP-III Guidelines, HDL-C >59 mg/dL is considered a negative risk factor for CHD.   [name] VLDL Cholesterol Cal 07/03/2014 22  5 - 40 mg/dL Final  . LDL Calculated 07/03/2014 100* 0 - 99 mg/dL Final  . Chol/HDL Ratio 07/03/2014 3.3  0.0 - 5.0 ratio units Final   Comment:                                   T. Chol/HDL Ratio                                             Men  Women                               1/2 Avg.Risk  3.4    3.3                                   Avg.Risk  5.0    4.4                                2X Avg.Risk  9.6    7.1                                3X Avg.Risk 23.4   11.0   . Glucose 07/03/2014 101* 65 - 99 mg/dL Final  . BUN 07/03/2014 16  8 - 27 mg/dL Final  . Creatinine, Ser 07/03/2014 1.10  0.76 - 1.27 mg/dL Final  . GFR calc non Af Amer 07/03/2014 61  >59 mL/min/1.73 Final  . GFR calc Af Amer 07/03/2014 70  >59 mL/min/1.73 Final  . BUN/Creatinine Ratio 07/03/2014 15  10 - 22 Final  . Sodium 07/03/2014 143  134 - 144 mmol/L Final  . Potassium 07/03/2014 4.0  3.5 -  5.2 mmol/L Final  . Chloride 07/03/2014 100  97 - 108 mmol/L Final  . CO2 07/03/2014 24  18 - 29 mmol/L Final  . Calcium 07/03/2014 9.7  8.6 - 10.2 mg/dL Final  . Total Protein  07/03/2014 6.4  6.0 - 8.5 g/dL Final  . Albumin 07/03/2014 4.1  3.5 - 4.7 g/dL Final  . Globulin, Total 07/03/2014 2.3  1.5 - 4.5 g/dL Final  . Albumin/Globulin Ratio 07/03/2014 1.8  1.1 - 2.5 Final  . Total Bilirubin 07/03/2014 0.8  0.0 - 1.2 mg/dL Final  . Alkaline Phosphatase 07/03/2014 74  39 - 117 IU/L Final  . AST 07/03/2014 27  0 - 40 IU/L Final  . ALT 07/03/2014 24  0 - 44 IU/L Final     Assessment/Plan  1. Pruritus Patient says his problem is getting better today. Advised him to use 0 Tevdek, Claritin, or Benadryl if itching resumes. - Comprehensive metabolic panel; Future  2. Essential and other specified forms of tremor Unchanged  3. Essential hypertension Controlled - Comprehensive metabolic panel; Future  4. Hyperlipidemia - Lipid panel; Future  5. Pulmonary embolism Screened for hypercoagulable state. Labs still pending.

## 2015-01-03 LAB — PROTEIN S ACTIVITY: Protein S Activity: 107 % (ref 60–145)

## 2015-01-03 LAB — PROTEIN C ACTIVITY: PROTEIN C ACTIVITY: 155 % — AB (ref 74–151)

## 2015-01-03 LAB — ANTITHROMBIN PANEL
ANTITHROMB III FUNC: 118 % (ref 75–135)
AT III AG PPP IMM-ACNC: 118 % (ref 75–130)

## 2015-01-03 LAB — HOMOCYSTEINE: HOMOCYSTEINE: 12.1 umol/L (ref 0.0–15.0)

## 2015-01-05 ENCOUNTER — Telehealth: Payer: Self-pay

## 2015-01-05 NOTE — Telephone Encounter (Signed)
Patient left message on voicemail requesting lab results. Patient is aware that results can take up to 1 week per [facility].

## 2015-01-08 LAB — FACTOR 5 LEIDEN

## 2015-01-24 ENCOUNTER — Other Ambulatory Visit: Payer: Self-pay

## 2015-01-24 DIAGNOSIS — Z8673 Personal history of transient ischemic attack (TIA), and cerebral infarction without residual deficits: Secondary | ICD-10-CM

## 2015-01-24 DIAGNOSIS — I2699 Other pulmonary embolism without acute cor pulmonale: Secondary | ICD-10-CM

## 2015-01-24 MED ORDER — APIXABAN 5 MG PO TABS: 5.0000 mg | ORAL_TABLET | Freq: Two times a day (BID) | ORAL | Status: DC

## 2015-05-15 ENCOUNTER — Telehealth: Payer: Self-pay | Admitting: *Deleted

## 2015-05-15 NOTE — Telephone Encounter (Signed)
Patient wife called and stated that they have only been getting #30 tablets from the pharmacy and it has been costing them $83.00. Stated that she is going to [facility] to see if she can get it cheaper. Their insurance coverage will be changing in January.

## 2015-07-09 ENCOUNTER — Other Ambulatory Visit: Payer: PPO

## 2015-07-11 ENCOUNTER — Encounter: Payer: Self-pay | Admitting: Internal Medicine

## 2015-07-11 ENCOUNTER — Ambulatory Visit (INDEPENDENT_AMBULATORY_CARE_PROVIDER_SITE_OTHER): Payer: PPO | Admitting: Internal Medicine

## 2015-07-11 VITALS — BP 118/70 | HR 55 | Temp 97.7°F | Ht 68.0 in | Wt 153.0 lb

## 2015-07-11 DIAGNOSIS — C61 Malignant neoplasm of prostate: Secondary | ICD-10-CM | POA: Diagnosis not present

## 2015-07-11 DIAGNOSIS — G250 Essential tremor: Secondary | ICD-10-CM | POA: Diagnosis not present

## 2015-07-11 DIAGNOSIS — Z8673 Personal history of transient ischemic attack (TIA), and cerebral infarction without residual deficits: Secondary | ICD-10-CM

## 2015-07-11 DIAGNOSIS — M545 Low back pain: Secondary | ICD-10-CM

## 2015-07-11 DIAGNOSIS — G8929 Other chronic pain: Secondary | ICD-10-CM

## 2015-07-11 DIAGNOSIS — I10 Essential (primary) hypertension: Secondary | ICD-10-CM | POA: Diagnosis not present

## 2015-07-11 DIAGNOSIS — E785 Hyperlipidemia, unspecified: Secondary | ICD-10-CM | POA: Diagnosis not present

## 2015-07-11 NOTE — Progress Notes (Signed)
Patient ID: Henry Leandro., male   DOB: 07/14/1926, 82 y.o.   MRN: [id number]    Facility  [place]    Place of Service:   OFFICE    No Known Allergies  Chief Complaint  Patient presents with  . Medical Management of Chronic Issues    6 month follow-up, not fasting today   . Nevus    Examine several moles on back   . Immunizations    Discuss Pneu23  . Advance Directive    Discuss HPOA/Living Will     HPI:  Patient presents for routine follow-up of medical issues. He is feeling well. There've been no medical events since he was last seen.  Essential hypertension - controlled  Hyperlipidemia - no recent lab  Chronic midline low back pain without sciatica - mild-to-moderate back discomfort with use  History of CVA (cerebrovascular accident) - no residual deficits  Benign essential tremor - mild and does not interfere with activities of daily living    Medications: Patient's Medications  New Prescriptions   No medications on file  Previous Medications   APIXABAN (ELIQUIS) 5 MG TABS TABLET    Take 1 tablet (5 mg total) by mouth 2 (two) times daily. START 12/28   ATORVASTATIN (LIPITOR) 10 MG TABLET    TAKE 1 TABLET (10 MG TOTAL) BY MOUTH DAILY. TO LOWER CHOLESTEROL   CHOLECALCIFEROL (VITAMIN D-3) 1000 UNITS CAPS    Take by mouth. Take one tablet by mouth once daily.   HYDROCHLOROTHIAZIDE (HYDRODIURIL) 25 MG TABLET    TAKE 1 TABLET BY MOUTH EVERY DAY TO CONTROL BLOOD PRESSURE   MULTIPLE VITAMIN (MULTIVITAMIN) TABLET    Take 1 tablet by mouth daily.  Modified Medications   No medications on file  Discontinued Medications   No medications on file    Review of Systems  Constitutional: Negative.  Negative for fever, activity change, appetite change, fatigue and unexpected weight change.  HENT: Negative.  Negative for congestion, ear pain, hearing loss, rhinorrhea, sore throat, tinnitus, trouble swallowing and voice change.   Eyes:       Corrective lenses  Respiratory:  Negative for cough, choking, chest tightness, shortness of breath and wheezing.        History of pulmonary embolism December 2015.   Cardiovascular: Negative.  Negative for chest pain, palpitations and leg swelling.  Gastrointestinal: Negative.  Negative for nausea, abdominal pain, diarrhea, constipation and abdominal distention.  Endocrine: Negative for cold intolerance, heat intolerance, polydipsia, polyphagia and polyuria.  Genitourinary: Positive for urgency and frequency. Negative for dysuria and testicular pain.       History of CA prostateSome incontinence. Nocturia x 4.  Musculoskeletal: Positive for myalgias, back pain and gait problem. Negative for arthralgias and neck pain.       Right biceps head torn playing tennis about 2014.  Skin: Negative for color change, pallor and rash.  Allergic/Immunologic: Negative.   Neurological: Negative for dizziness, tremors, syncope, speech difficulty, weakness, numbness and headaches.       CVA in 2005. Numbness. Mild tremor right hand and arm. History of falls while playing tennis.  Hematological: Negative for adenopathy. Does not bruise/bleed easily.  Psychiatric/Behavioral: Negative for hallucinations, behavioral problems, confusion, sleep disturbance and decreased concentration. The patient is not nervous/anxious.     Filed Vitals:   07/11/15 1140  BP: 118/70  Pulse: 55  Temp: 97.7 F (36.5 C)  TempSrc: Oral  Height: _0  (1.727 m)  Weight: 153 lb (69.4 kg)  SpO2:  96%   Body mass index is 23.27 kg/(m^2). Filed Weights   07/11/15 1140  Weight: 153 lb (69.4 kg)     Physical Exam  Constitutional: He is oriented to person, place, and time. He appears well-developed and well-nourished. No distress.  HENT:  Head: Atraumatic.  Mild loss of hearing.  Eyes: Conjunctivae and EOM are normal. Pupils are equal, round, and reactive to light.  Neck: Normal range of motion. Neck supple. No JVD present. No tracheal deviation present. No  thyromegaly present.  Cardiovascular: Normal rate, regular rhythm, normal heart sounds and intact distal pulses.  Exam reveals no gallop and no friction rub.   No murmur heard. Pulmonary/Chest: Effort normal and breath sounds normal. No respiratory distress. He has no wheezes. He has no rales.  Abdominal: Soft. Bowel sounds are normal. He exhibits no distension and no mass. There is no tenderness.  Musculoskeletal: Normal range of motion. He exhibits tenderness. He exhibits no edema.  Low back paraspinal muscular  discomfort with flexion,, ext., and rotational movements.  Lymphadenopathy:    He has no cervical adenopathy.  Neurological: He is alert and oriented to person, place, and time. No cranial nerve deficit. Coordination normal.  Reduced vibratory sensation in both feet. 04/05/14 MMSE 29/30. Passed clock drawing.  Skin: Skin is warm. No rash noted. No erythema. No pallor.  Psychiatric: He has a normal mood and affect. His behavior is normal. Thought content normal.    Labs reviewed: Lab Summary Latest Ref Rng 07/03/2014 05/22/2014 05/21/2014 05/20/2014  Hemoglobin 13.0 - 17.0 g/dL (None) 15.2 14.9 17.2(H)  Hematocrit 39.0 - 52.0 % (None) 46.0 44.8 51.3  White count 4.0 - 10.5 K/uL (None) 7.6 7.9 11.1(H)  Platelet count 150 - 400 K/uL (None) 182 172 190  Sodium 134 - 144 mmol/L 143 142 (None) 138  Potassium 3.5 - 5.2 mmol/L 4.0 4.1 (None) 3.8  Calcium 8.6 - 10.2 mg/dL 9.7 9.1 (None) 9.5  Phosphorus - (None) (None) (None) (None)  Creatinine 0.76 - 1.27 mg/dL 1.10 1.14 (None) 1.15  AST 0 - 40 IU/L 27 (None) (None) 24  Alk Phos 39 - 117 IU/L 74 (None) (None) 88  Bilirubin 0.0 - 1.2 mg/dL 0.8 (None) (None) 0.6  Glucose 65 - 99 mg/dL 101(H) 97 (None) 140(H)  Cholesterol - (None) (None) (None) (None)  HDL cholesterol >39 mg/dL 52 (None) (None) (None)  Triglycerides 0 - 149 mg/dL 111 (None) (None) (None)  LDL Direct - (None) (None) (None) (None)  LDL Calc 0 - 99 mg/dL 100(H) (None)  (None) (None)  Total protein 6.0 - 8.3 g/dL (None) (None) (None) 7.1  Albumin 3.5 - 4.7 g/dL 4.1 (None) (None) 3.8   No results found for: TSH, T3TOTAL, T4TOTAL, THYROIDAB Lab Results  Component Value Date   BUN 16 07/03/2014   BUN 18 05/22/2014   BUN 21 05/20/2014   No results found for: HGBA1C  Assessment/Plan  1. Essential hypertension Controlled  2. Hyperlipidemia Follow-up lab prior to next visit  3. Chronic midline low back pain without sciatica Reasonably comfortable with current activity level  4. History of CVA (cerebrovascular accident) No residual deficits  5. Benign essential tremor Mild and does not interfere with ADLs  6. Prostate cancer (HCC) - PSA; Future

## 2015-09-26 DIAGNOSIS — Z85828 Personal history of other malignant neoplasm of skin: Secondary | ICD-10-CM | POA: Diagnosis not present

## 2015-09-26 DIAGNOSIS — D225 Melanocytic nevi of trunk: Secondary | ICD-10-CM | POA: Diagnosis not present

## 2015-09-26 DIAGNOSIS — L821 Other seborrheic keratosis: Secondary | ICD-10-CM | POA: Diagnosis not present

## 2015-09-26 DIAGNOSIS — Z08 Encounter for follow-up examination after completed treatment for malignant neoplasm: Secondary | ICD-10-CM | POA: Diagnosis not present

## 2015-10-15 ENCOUNTER — Encounter: Payer: Self-pay | Admitting: Pulmonary Disease

## 2015-10-15 ENCOUNTER — Other Ambulatory Visit: Payer: PPO

## 2015-10-15 ENCOUNTER — Ambulatory Visit (INDEPENDENT_AMBULATORY_CARE_PROVIDER_SITE_OTHER): Payer: PPO | Admitting: Pulmonary Disease

## 2015-10-15 VITALS — BP 136/74 | HR 67 | Ht 68.0 in | Wt 150.4 lb

## 2015-10-15 DIAGNOSIS — Z5181 Encounter for therapeutic drug level monitoring: Secondary | ICD-10-CM

## 2015-10-15 DIAGNOSIS — I2699 Other pulmonary embolism without acute cor pulmonale: Secondary | ICD-10-CM

## 2015-10-15 NOTE — Assessment & Plan Note (Addendum)
Mr. Morter is here to see me today to discuss whether or not he should continue taking Eliquis.  I told him today that I think the answer to that question is "no".  Specifically, though he had an idiopathic pulmonary embolism which carries a risk of recurrence of about 15-20%, the frequent falls he has been experiencing recently increase his risk of a life-threatening bleed significantly. He is quite nervous about stopping Eliquis and he downplays the significance of the false he has experienced. However, given his active lifestyle, admitted instability with walking, I think his risk of a life-threatening bleed from Eliquis exceeds the 15-20% chance of recurrent clot.  He is not happy with this recommendation.  Plan: Check d-dimer today, we will call him with the results I explained to him that when he stops Eliquis we can follow serial d-dimer testing over several weeks for the next several months to survey for evidence of recurrent clot Follow-up 6 weeks

## 2015-10-15 NOTE — Patient Instructions (Signed)
We will call you with the results of today's blood test I want you to think about stopping Eliquis as we discussed today, as I am concerned that the risk from Eliquis is too high and I recommend that you stop it at this time When you stop the medication, we can monitor your blood for evidence of a recurrent clot over the course of the next several weeks We will see you back in 6 weeks or sooner if needed

## 2015-10-15 NOTE — Progress Notes (Signed)
Subjective:    Patient ID: Henry Franklin., male    DOB: [birth year], 82 y.o.   MRN: [id number]  HPI  Chief Complaint  Patient presents with  . Advice Only    self-referral for follow up on PE. Pt was seen at [facility] in Dec. 2015 for PE, still on Eliquis.      This is a pleasant 82 year old male who had a pulmonary embolism in December 2015 who has come to my clinic today for evaluation of the same. He tells me that he went to the emergency department complaining of chest tightness and shortness of breath and was found to have the blood clot. Prior to this he had no surgery, long travel, leg swelling or pain. He's never had a blood clot in the past before. Since then he has been taking Eliquis. He has not had any bleeding.  He is here to see me today primarily to discuss whether or not he should continue taking Eliquis. He says that he is nervous about his risk of recurrence of blood clot after stopping the medication.  He tells me that he has no respiratory complaints at all. He actually rides his bike 30 minutes a day and his neighborhood. However, he has had several falls in the last few years. Specifically, he fell while playing tennis about 2 years ago 3 separate times that he stopped. Since then, he says that he's had some "minor falls". The most recent was approximately 2 weeks ago when he fell in his bathroom. He denies striking his head.  He says that he has poor balance as well.   Past Medical History  Diagnosis Date  . HTN (hypertension)   . Stroke ([place])   . TIA (transient ischemic attack)   . Hyperlipidemia   . Malignant neoplasm of prostate ([place])   . Essential and other specified forms of tremor   . Supraventricular premature beats   . Other premature beats   . Cardiac dysrhythmia, unspecified   . Acute, but ill-defined, cerebrovascular disease ([place])   . Unspecified late effects of cerebrovascular disease   . External hemorrhoids without mention of complication   .  Unspecified anomaly of tooth position   . Cellulitis and abscess of unspecified site   . Sebaceous cyst   . Lumbago   . Nontraumatic rupture of tendons of biceps (long head)   . Other convulsions   . Dizziness and giddiness   . Other malaise and fatigue   . Palpitations   . Encounter for long-term (current) use of other medications      Family History  Problem Relation Age of Onset  . Cancer Father 23    brain tumor     Social History   Social History  . Marital Status: Married    Spouse Name: N/A  . Number of Children: N/A  . Years of Education: N/A   Occupational History  . retired    Social History Main Topics  . Smoking status: Never Smoker   . Smokeless tobacco: Never Used  . Alcohol Use: No  . Drug Use: No  . Sexual Activity: Not on file   Other Topics Concern  . Not on file   Social History Narrative     No Known Allergies   Outpatient Prescriptions Prior to Visit  Medication Sig Dispense Refill  . apixaban (ELIQUIS) 5 MG TABS tablet Take 1 tablet (5 mg total) by mouth 2 (two) times daily. START 12/28 60 tablet 6  .  atorvastatin (LIPITOR) 10 MG tablet TAKE 1 TABLET (10 MG TOTAL) BY MOUTH DAILY. TO LOWER CHOLESTEROL 90 tablet 3  . Cholecalciferol (VITAMIN D-3) 1000 UNITS CAPS Take by mouth. Take one tablet by mouth once daily.    . hydrochlorothiazide (HYDRODIURIL) 25 MG tablet TAKE 1 TABLET BY MOUTH EVERY DAY TO CONTROL BLOOD PRESSURE 90 tablet 3  . Multiple Vitamin (MULTIVITAMIN) tablet Take 1 tablet by mouth daily.     No facility-administered medications prior to visit.        Review of Systems  Constitutional: Positive for fatigue.  HENT: Positive for congestion and sneezing. Negative for ear pain, postnasal drip, rhinorrhea, sinus pressure, sore throat, trouble swallowing and voice change.   Eyes: Negative.   Respiratory: Positive for cough and shortness of breath. Negative for apnea, choking, chest tightness, wheezing and stridor.     Cardiovascular: Negative.  Negative for chest pain, palpitations and leg swelling.  Gastrointestinal: Negative.  Negative for nausea, vomiting, abdominal pain and abdominal distention.  Genitourinary: Negative.   Musculoskeletal: Negative.  Negative for myalgias and arthralgias.  Skin: Negative.  Negative for rash.  Allergic/Immunologic: Negative.  Negative for environmental allergies and food allergies.  Neurological: Negative.  Negative for dizziness, syncope, weakness and headaches.  Hematological: Negative.  Negative for adenopathy. Does not bruise/bleed easily.  Psychiatric/Behavioral: Negative.  Negative for sleep disturbance and agitation. The patient is not nervous/anxious.        Objective:   Physical Exam  Filed Vitals:   10/15/15 1621  BP: 136/74  Pulse: 67  Height: 5\' 8"  (1.727 m)  Weight: 150 lb 6.4 oz (68.221 kg)  SpO2: 94%   RA  Gen: well appearing, no acute distress HENT: NCAT, OP clear, neck supple without masses Eyes: PERRL, EOMi Lymph: no cervical lymphadenopathy PULM: CTA B CV: RRR, no mgr, no JVD GI: BS+, soft, nontender, no hsm Derm: no rash or skin breakdown MSK: normal bulk and tone Neuro: A&Ox4, CN II-XII intact, strength 5/5 in all 4 extremities Psyche: normal mood and affect  CT chest images from December 2015 personally reviewed showing groundglass and mild interstitial changes in a dependent distribution in both lungs, patulous esophagus noted  Hospital discharge summary from December 2015 reviewed were he was admitted for a blood clot.     Assessment & Plan:  Pulmonary embolism [facility]) [name] is here to see me today to discuss whether or not he should continue taking Eliquis.  I told him today that I think the answer to that question his note.  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 He is quite nervous about stopping Eliquis and he downplays the significance of the false he has experienced. However, given his active lifestyle, admitted instability with walking, I think his risk of a life-threatening bleed from Eliquis exceeds the 15-20% chance of recurrent clot.  He is not happy with this recommendation.  Plan: Check d-dimer today, we will call him with the results I explained to him that when he stops Eliquis we can follow serial d-dimer testing over several weeks for the next several months to survey for evidence of recurrent clot Follow-up 6 weeks     Current outpatient prescriptions:  .  apixaban (ELIQUIS) 5 MG TABS tablet, Take 1 tablet (5 mg total) by mouth 2 (two) times daily. START 12/28, Disp: 60 tablet, Rfl:  6 .  atorvastatin (LIPITOR) 10 MG tablet, TAKE 1 TABLET (10 MG TOTAL) BY MOUTH DAILY. TO LOWER CHOLESTEROL, Disp: 90 tablet, Rfl: 3 .  Cholecalciferol (VITAMIN D-3) 1000 UNITS CAPS, Take by mouth. Take one tablet by mouth once daily., Disp: , Rfl:  .  hydrochlorothiazide (HYDRODIURIL) 25 MG tablet, TAKE 1 TABLET BY MOUTH EVERY DAY TO CONTROL BLOOD PRESSURE, Disp: 90 tablet, Rfl: 3 .  Multiple Vitamin (MULTIVITAMIN) tablet, Take 1 tablet by mouth daily., Disp: , Rfl:

## 2015-10-16 LAB — D-DIMER, QUANTITATIVE: D-Dimer, Quant: 0.29 ug/mL-FEU (ref 0.00–0.48)

## 2015-10-17 ENCOUNTER — Telehealth: Payer: Self-pay | Admitting: Pulmonary Disease

## 2015-10-17 NOTE — Telephone Encounter (Signed)
Notes Recorded by Len Blalock, CMA on 10/16/2015 at 5:09 PM lmtcb X1 for pt to relay results/recs.  Notes Recorded by Juanito Doom, MD on 10/16/2015 at 10:30 AM A, Please let the patient know this was OK. Please ask him if he has thought about stopping Eliquis. Thanks, B -------------------------------------- Spoke with pt. He is aware of his results. States that he is leaving for a trip and would like get that over with before he stops Eliquis. He will call us once he returns from this trip. Nothing further was needed.

## 2015-11-09 ENCOUNTER — Other Ambulatory Visit: Payer: Self-pay | Admitting: Internal Medicine

## 2015-11-12 ENCOUNTER — Telehealth: Payer: Self-pay | Admitting: Pulmonary Disease

## 2015-11-12 NOTE — Telephone Encounter (Signed)
Spoke with pt. He is aware of BQ's recommendation. Pt already has a follow up with BQ on 12/06/15 at 2:15pm. Nothing further was needed.

## 2015-11-12 NOTE — Telephone Encounter (Signed)
I recommend that he stop Eliquis, then follow up with me or an NP in 4-6 weeks and have a D-dimer test.  If that is elevated then we would consider imaging his legs to look for recurrent DVT.  If he has leg swelling, leg pain, or dyspnea off of Eliquis he needs to let us know immediately.

## 2015-11-12 NOTE — Telephone Encounter (Signed)
Spoke with pt. He states that his last dose of Eliquis 5mg  was on Saturday. Pt would like to know what BQ wants him to do from here.  BQ - please advise. Thanks.

## 2015-11-19 ENCOUNTER — Other Ambulatory Visit (HOSPITAL_COMMUNITY): Payer: PPO

## 2015-11-19 ENCOUNTER — Encounter (HOSPITAL_COMMUNITY)
Admission: EM | Disposition: A | Discharge status: Home / Self Care | Payer: Self-pay | Source: Home / Self Care | Attending: Cardiovascular Disease

## 2015-11-19 ENCOUNTER — Inpatient Hospital Stay (HOSPITAL_COMMUNITY)
Admission: EM | Admit: 2015-11-19 | Discharge: 2015-11-22 | DRG: 247 | Disposition: A | Discharge status: Home / Self Care | Payer: PPO | Attending: Cardiovascular Disease | Admitting: Cardiovascular Disease

## 2015-11-19 ENCOUNTER — Other Ambulatory Visit: Payer: Self-pay

## 2015-11-19 ENCOUNTER — Encounter (HOSPITAL_COMMUNITY): Payer: Self-pay | Admitting: Cardiovascular Disease

## 2015-11-19 ENCOUNTER — Other Ambulatory Visit (HOSPITAL_COMMUNITY): Payer: Self-pay

## 2015-11-19 DIAGNOSIS — I2129 ST elevation (STEMI) myocardial infarction involving other sites: Secondary | ICD-10-CM | POA: Diagnosis not present

## 2015-11-19 DIAGNOSIS — Z8673 Personal history of transient ischemic attack (TIA), and cerebral infarction without residual deficits: Secondary | ICD-10-CM | POA: Diagnosis not present

## 2015-11-19 DIAGNOSIS — I2109 ST elevation (STEMI) myocardial infarction involving other coronary artery of anterior wall: Principal | ICD-10-CM

## 2015-11-19 DIAGNOSIS — I213 ST elevation (STEMI) myocardial infarction of unspecified site: Secondary | ICD-10-CM | POA: Diagnosis present

## 2015-11-19 DIAGNOSIS — Z955 Presence of coronary angioplasty implant and graft: Secondary | ICD-10-CM

## 2015-11-19 DIAGNOSIS — I249 Acute ischemic heart disease, unspecified: Secondary | ICD-10-CM

## 2015-11-19 DIAGNOSIS — E785 Hyperlipidemia, unspecified: Secondary | ICD-10-CM | POA: Diagnosis not present

## 2015-11-19 DIAGNOSIS — I10 Essential (primary) hypertension: Secondary | ICD-10-CM | POA: Diagnosis not present

## 2015-11-19 DIAGNOSIS — Z86711 Personal history of pulmonary embolism: Secondary | ICD-10-CM

## 2015-11-19 DIAGNOSIS — Z8546 Personal history of malignant neoplasm of prostate: Secondary | ICD-10-CM | POA: Diagnosis not present

## 2015-11-19 DIAGNOSIS — I2510 Atherosclerotic heart disease of native coronary artery without angina pectoris: Secondary | ICD-10-CM | POA: Diagnosis present

## 2015-11-19 DIAGNOSIS — I2102 ST elevation (STEMI) myocardial infarction involving left anterior descending coronary artery: Secondary | ICD-10-CM | POA: Diagnosis not present

## 2015-11-19 DIAGNOSIS — I480 Paroxysmal atrial fibrillation: Secondary | ICD-10-CM | POA: Diagnosis not present

## 2015-11-19 DIAGNOSIS — I4891 Unspecified atrial fibrillation: Secondary | ICD-10-CM

## 2015-11-19 DIAGNOSIS — I471 Supraventricular tachycardia: Secondary | ICD-10-CM | POA: Diagnosis not present

## 2015-11-19 DIAGNOSIS — R0789 Other chest pain: Secondary | ICD-10-CM | POA: Diagnosis not present

## 2015-11-19 HISTORY — DX: Paroxysmal atrial fibrillation: I48.0

## 2015-11-19 HISTORY — PX: CARDIAC CATHETERIZATION: SHX172

## 2015-11-19 HISTORY — DX: Atherosclerotic heart disease of native coronary artery without angina pectoris: I25.10

## 2015-11-19 LAB — CBC
HCT: 46.8 % (ref 39.0–52.0)
HEMATOCRIT: 51.6 % (ref 39.0–52.0)
Hemoglobin: 16.6 g/dL (ref 13.0–17.0)
Hemoglobin: 15.5 g/dL (ref 13.0–17.0)
MCH: 30 pg (ref 26.0–34.0)
MCH: 30.5 pg (ref 26.0–34.0)
MCHC: 32.2 g/dL (ref 30.0–36.0)
MCHC: 33.1 g/dL (ref 30.0–36.0)
MCV: 93.1 fL (ref 78.0–100.0)
MCV: 91.9 fL (ref 78.0–100.0)
PLATELETS: 168 10*3/uL (ref 150–400)
Platelets: 190 10*3/uL (ref 150–400)
RBC: 5.54 MIL/uL (ref 4.22–5.81)
RBC: 5.09 MIL/uL (ref 4.22–5.81)
RDW: 13.3 % (ref 11.5–15.5)
RDW: 13.4 % (ref 11.5–15.5)
WBC: 9.1 10*3/uL (ref 4.0–10.5)
WBC: 8.5 10*3/uL (ref 4.0–10.5)

## 2015-11-19 LAB — COMPREHENSIVE METABOLIC PANEL
ALBUMIN: 3.8 g/dL (ref 3.5–5.0)
ALK PHOS: 63 U/L (ref 38–126)
ALT: 25 U/L (ref 17–63)
ANION GAP: 8 (ref 5–15)
AST: 29 U/L (ref 15–41)
BUN: 19 mg/dL (ref 6–20)
CALCIUM: 9.7 mg/dL (ref 8.9–10.3)
CO2: 28 mmol/L (ref 22–32)
CREATININE: 1.1 mg/dL (ref 0.61–1.24)
Chloride: 107 mmol/L (ref 101–111)
GFR calc Af Amer: >60 mL/min (ref >60)
GFR calc non Af Amer: 59 mL/min — ABNORMAL LOW (ref >60)
GLUCOSE: 106 mg/dL — AB (ref 65–99)
Potassium: 3.6 mmol/L (ref 3.5–5.1)
SODIUM: 143 mmol/L (ref 135–145)
Total Bilirubin: 0.8 mg/dL (ref 0.3–1.2)
Total Protein: 6.6 g/dL (ref 6.5–8.1)

## 2015-11-19 LAB — LIPID PANEL
Cholesterol: 152 mg/dL (ref 0–200)
HDL: 48 mg/dL (ref >40)
LDL CALC: 76 mg/dL (ref 0–99)
TRIGLYCERIDES: 139 mg/dL (ref <150)
Total CHOL/HDL Ratio: 3.2 RATIO
VLDL: 28 mg/dL (ref 0–40)

## 2015-11-19 LAB — I-STAT CHEM 8, ED
BUN: 28 mg/dL — AB (ref 6–20)
CALCIUM ION: 1.17 mmol/L (ref 1.13–1.30)
Chloride: 103 mmol/L (ref 101–111)
Creatinine, Ser: 1 mg/dL (ref 0.61–1.24)
Glucose, Bld: 103 mg/dL — ABNORMAL HIGH (ref 65–99)
HCT: 52 % (ref 39.0–52.0)
HEMOGLOBIN: 17.7 g/dL — AB (ref 13.0–17.0)
Potassium: 3.9 mmol/L (ref 3.5–5.1)
SODIUM: 143 mmol/L (ref 135–145)
TCO2: 31 mmol/L (ref 0–100)

## 2015-11-19 LAB — MRSA PCR SCREENING: MRSA BY PCR: NEGATIVE

## 2015-11-19 LAB — TROPONIN I
TROPONIN I: 41.8 ng/mL — AB (ref <0.031)
Troponin I: <0.03 ng/mL (ref <0.031)
Troponin I: 10.29 ng/mL (ref <0.031)

## 2015-11-19 LAB — DIFFERENTIAL
Basophils Absolute: 0 10*3/uL (ref 0.0–0.1)
Basophils Relative: 0 %
EOS PCT: 3 %
Eosinophils Absolute: 0.3 10*3/uL (ref 0.0–0.7)
LYMPHS ABS: 3.5 10*3/uL (ref 0.7–4.0)
LYMPHS PCT: 38 %
MONO ABS: 0.9 10*3/uL (ref 0.1–1.0)
MONOS PCT: 10 %
NEUTROS ABS: 4.5 10*3/uL (ref 1.7–7.7)
Neutrophils Relative %: 49 %

## 2015-11-19 LAB — POCT ACTIVATED CLOTTING TIME: ACTIVATED CLOTTING TIME: 356 s

## 2015-11-19 LAB — I-STAT TROPONIN, ED: TROPONIN I, POC: 0.01 ng/mL (ref 0.00–0.08)

## 2015-11-19 LAB — PROTIME-INR
INR: 1.03 (ref 0.00–1.49)
Prothrombin Time: 13.7 seconds (ref 11.6–15.2)

## 2015-11-19 LAB — CREATININE, SERUM
CREATININE: 0.97 mg/dL (ref 0.61–1.24)
GFR calc Af Amer: >60 mL/min (ref >60)
GFR calc non Af Amer: >60 mL/min (ref >60)

## 2015-11-19 LAB — APTT: aPTT: 28 seconds (ref 24–37)

## 2015-11-19 SURGERY — LEFT HEART CATH AND CORONARY ANGIOGRAPHY

## 2015-11-19 MED ORDER — NITROGLYCERIN 1 MG/10 ML FOR IR/CATH LAB
INTRA_ARTERIAL | Status: AC
  Filled 2015-11-19: qty 10

## 2015-11-19 MED ORDER — SODIUM CHLORIDE 0.9% FLUSH: 3.0000 mL | INTRAVENOUS | Status: DC | PRN

## 2015-11-19 MED ORDER — MIDAZOLAM HCL 2 MG/2ML IJ SOLN
INTRAMUSCULAR | Status: AC
  Filled 2015-11-19: qty 2

## 2015-11-19 MED ORDER — ATORVASTATIN CALCIUM 80 MG PO TABS
80.0000 mg | ORAL_TABLET | Freq: Every day | ORAL | Status: DC
  Administered 2015-11-19 – 2015-11-21 (×3): 80 mg via ORAL
  Filled 2015-11-19 (×3): qty 1

## 2015-11-19 MED ORDER — CLOPIDOGREL BISULFATE 300 MG PO TABS
ORAL_TABLET | ORAL | Status: DC | PRN
  Administered 2015-11-19: 600 mg via ORAL

## 2015-11-19 MED ORDER — SODIUM CHLORIDE 0.9% FLUSH
3.0000 mL | Freq: Two times a day (BID) | INTRAVENOUS | Status: DC
  Administered 2015-11-19 – 2015-11-21 (×4): 3 mL via INTRAVENOUS

## 2015-11-19 MED ORDER — ASPIRIN EC 81 MG PO TBEC
81.0000 mg | DELAYED_RELEASE_TABLET | Freq: Every day | ORAL | Status: DC
  Administered 2015-11-20 – 2015-11-22 (×3): 81 mg via ORAL
  Filled 2015-11-19 (×3): qty 1

## 2015-11-19 MED ORDER — SODIUM CHLORIDE 0.9 % IV SOLN: 250.0000 mL | INTRAVENOUS | Status: DC | PRN

## 2015-11-19 MED ORDER — HEPARIN (PORCINE) IN NACL 2-0.9 UNIT/ML-% IJ SOLN
INTRAMUSCULAR | Status: DC | PRN
  Administered 2015-11-19: 1000 mL

## 2015-11-19 MED ORDER — METOPROLOL TARTRATE 25 MG PO TABS
25.0000 mg | ORAL_TABLET | Freq: Two times a day (BID) | ORAL | Status: DC
  Administered 2015-11-19: 25 mg via ORAL
  Filled 2015-11-19: qty 1

## 2015-11-19 MED ORDER — LIDOCAINE HCL (PF) 1 % IJ SOLN
INTRAMUSCULAR | Status: DC | PRN
  Administered 2015-11-19: 2 mL

## 2015-11-19 MED ORDER — ACETAMINOPHEN 325 MG PO TABS: 650.0000 mg | ORAL_TABLET | ORAL | Status: DC | PRN

## 2015-11-19 MED ORDER — ONDANSETRON HCL 4 MG/2ML IJ SOLN: 4.0000 mg | Freq: Four times a day (QID) | INTRAMUSCULAR | Status: DC | PRN

## 2015-11-19 MED ORDER — HEPARIN SODIUM (PORCINE) 5000 UNIT/ML IJ SOLN
4000.0000 [IU] | Freq: Once | INTRAMUSCULAR | Status: AC
  Administered 2015-11-19: 4000 [IU] via INTRAVENOUS
  Filled 2015-11-19: qty 1

## 2015-11-19 MED ORDER — VERAPAMIL HCL 2.5 MG/ML IV SOLN
INTRAVENOUS | Status: AC
  Filled 2015-11-19: qty 2

## 2015-11-19 MED ORDER — CLOPIDOGREL BISULFATE 300 MG PO TABS
ORAL_TABLET | ORAL | Status: AC
  Filled 2015-11-19: qty 1

## 2015-11-19 MED ORDER — IOPAMIDOL (ISOVUE-370) INJECTION 76%
INTRAVENOUS | Status: AC
  Filled 2015-11-19: qty 100

## 2015-11-19 MED ORDER — FENTANYL CITRATE (PF) 100 MCG/2ML IJ SOLN
INTRAMUSCULAR | Status: AC
  Filled 2015-11-19: qty 2

## 2015-11-19 MED ORDER — HEPARIN (PORCINE) IN NACL 2-0.9 UNIT/ML-% IJ SOLN
INTRAMUSCULAR | Status: AC
  Filled 2015-11-19: qty 500

## 2015-11-19 MED ORDER — FENTANYL CITRATE (PF) 100 MCG/2ML IJ SOLN
INTRAMUSCULAR | Status: DC | PRN
  Administered 2015-11-19 (×2): 25 ug via INTRAVENOUS

## 2015-11-19 MED ORDER — SODIUM CHLORIDE 0.9 % IV SOLN: INTRAVENOUS | Status: AC

## 2015-11-19 MED ORDER — VERAPAMIL HCL 2.5 MG/ML IV SOLN
INTRAVENOUS | Status: DC | PRN
  Administered 2015-11-19: 10 mL via INTRA_ARTERIAL

## 2015-11-19 MED ORDER — NITROGLYCERIN 0.4 MG SL SUBL: 0.4000 mg | SUBLINGUAL_TABLET | SUBLINGUAL | Status: DC | PRN

## 2015-11-19 MED ORDER — MIDAZOLAM HCL 2 MG/2ML IJ SOLN
INTRAMUSCULAR | Status: DC | PRN
  Administered 2015-11-19 (×2): 1 mg via INTRAVENOUS

## 2015-11-19 MED ORDER — BIVALIRUDIN 250 MG IV SOLR
INTRAVENOUS | Status: AC
  Filled 2015-11-19: qty 250

## 2015-11-19 MED ORDER — IOPAMIDOL (ISOVUE-370) INJECTION 76%
INTRAVENOUS | Status: DC | PRN
  Administered 2015-11-19: 240 mL via INTRA_ARTERIAL

## 2015-11-19 MED ORDER — LIDOCAINE HCL (PF) 1 % IJ SOLN
INTRAMUSCULAR | Status: AC
  Filled 2015-11-19: qty 30

## 2015-11-19 MED ORDER — CLOPIDOGREL BISULFATE 75 MG PO TABS
75.0000 mg | ORAL_TABLET | Freq: Every day | ORAL | Status: DC
  Administered 2015-11-20 – 2015-11-22 (×3): 75 mg via ORAL
  Filled 2015-11-19 (×3): qty 1

## 2015-11-19 MED ORDER — BIVALIRUDIN BOLUS VIA INFUSION - CUPID
INTRAVENOUS | Status: DC | PRN
  Administered 2015-11-19: 51 mg via INTRAVENOUS

## 2015-11-19 MED ORDER — ASPIRIN 81 MG PO CHEW
324.0000 mg | CHEWABLE_TABLET | Freq: Once | ORAL | Status: AC
  Administered 2015-11-19: 324 mg via ORAL
  Filled 2015-11-19: qty 4

## 2015-11-19 MED ORDER — SODIUM CHLORIDE 0.9 % IV SOLN
250.0000 mg | INTRAVENOUS | Status: DC | PRN
  Administered 2015-11-19: 1.75 mg/kg/h via INTRAVENOUS

## 2015-11-19 MED ORDER — SODIUM CHLORIDE 0.9 % IV SOLN
INTRAVENOUS | Status: DC | PRN
  Administered 2015-11-19: 1000 mL via INTRAVENOUS

## 2015-11-19 MED ORDER — HEPARIN (PORCINE) IN NACL 2-0.9 UNIT/ML-% IJ SOLN
INTRAMUSCULAR | Status: AC
  Filled 2015-11-19: qty 1000

## 2015-11-19 SURGICAL SUPPLY — 23 items
BALLN EUPHORA RX 2.0X12 (BALLOONS) ×3
BALLN ~~LOC~~ EUPHORA RX 2.25X8 (BALLOONS) ×3
BALLOON EUPHORA RX 2.0X12 (BALLOONS) IMPLANT
BALLOON ~~LOC~~ EUPHORA RX 2.25X8 (BALLOONS) IMPLANT
CATH HEARTRAIL 6F IL3.5 (CATHETERS) ×2 IMPLANT
CATH INFINITI 5FR ANG PIGTAIL (CATHETERS) ×2 IMPLANT
CATH INFINITI JR4 5F (CATHETERS) ×2 IMPLANT
CATH VISTA GUIDE 6FR JL3.5 (CATHETERS) ×2 IMPLANT
CATH VISTA GUIDE 6FR XB4 (CATHETERS) ×2 IMPLANT
CATH VISTA GUIDE 6FR XBLAD3.5 (CATHETERS) ×2 IMPLANT
DEVICE RAD COMP TR BAND LRG (VASCULAR PRODUCTS) ×2 IMPLANT
GLIDESHEATH SLEND SS 6F .021 (SHEATH) ×2 IMPLANT
GUIDE CATH RUNWAY 6FR AL2 (CATHETERS) ×2 IMPLANT
KIT ENCORE 26 ADVANTAGE (KITS) ×2 IMPLANT
KIT HEART LEFT (KITS) ×3 IMPLANT
PACK CARDIAC CATHETERIZATION (CUSTOM PROCEDURE TRAY) ×3 IMPLANT
STENT RESOLUTE INTEG 2.25X12 (Permanent Stent) ×2 IMPLANT
SYR MEDRAD MARK V 150ML (SYRINGE) ×3 IMPLANT
TRANSDUCER W/STOPCOCK (MISCELLANEOUS) ×3 IMPLANT
TUBING CIL FLEX 10 FLL-RA (TUBING) ×3 IMPLANT
WIRE COUGAR XT STRL 190CM (WIRE) ×2 IMPLANT
WIRE HI TORQ WHISPER MS 190CM (WIRE) ×2 IMPLANT
WIRE SAFE-T 1.5MM-J .035X260CM (WIRE) ×2 IMPLANT

## 2015-11-19 NOTE — ED Provider Notes (Signed)
CSN: [id number]     Arrival date & time 11/19/15  [id number] History   First MD Initiated Contact with Patient 11/19/15 0945     Chief Complaint  Patient presents with  . Chest Pain     HPI Patient presents to the emergency department with complaints of anterior central chest discomfort over the past hour.  No prior history of heart disease.  He has had a pulmonary embolism before was told to stop his request on Saturday.  He did take another dose of eliquis 20 minutes after developing chest discomfort as he was concerned this could be due to his stopping his blood thinner.  He reports no shortness of breath.  He reports some nauseaWithout vomiting.  No diarrhea.  No recent illness.  He does report a history of hypertension, stroke, hyperlipidemia.  He reports a negative stress test 2 years ago.  He has never had a heart catheterization.   Past Medical History  Diagnosis Date  . HTN (hypertension)   . Stroke ([place])   . TIA (transient ischemic attack)   . Hyperlipidemia   . Malignant neoplasm of prostate ([place])   . Essential and other specified forms of tremor   . Supraventricular premature beats   . Other premature beats   . Cardiac dysrhythmia, unspecified   . Acute, but ill-defined, cerebrovascular disease ([place])   . Unspecified late effects of cerebrovascular disease   . External hemorrhoids without mention of complication   . Unspecified anomaly of tooth position   . Cellulitis and abscess of unspecified site   . Sebaceous cyst   . Lumbago   . Nontraumatic rupture of tendons of biceps (long head)   . Other convulsions   . Dizziness and giddiness   . Other malaise and fatigue   . Palpitations   . Encounter for long-term (current) use of other medications    Past Surgical History  Procedure Laterality Date  . Prostate cancer    . Back surgery      with sciatica  . Appendectomy  1942  . Spine surgery N/A 2000    [place]   Family History  Problem Relation Age of Onset  . Cancer  Father 5    brain tumor   Social History  Substance Use Topics  . Smoking status: Never Smoker   . Smokeless tobacco: Never Used  . Alcohol Use: No    Review of Systems  All other systems reviewed and are negative.     Allergies  Review of patient's allergies indicates no known allergies.  Home Medications   Prior to Admission medications   Medication Sig Start Date End Date Taking? Authorizing Provider  apixaban (ELIQUIS) 5 MG TABS tablet Take 1 tablet (5 mg total) by mouth 2 (two) times daily. START 12/28 01/24/15   Estill Dooms, MD  atorvastatin (LIPITOR) 10 MG tablet TAKE 1 TABLET (10 MG TOTAL) BY MOUTH DAILY. TO LOWER CHOLESTEROL 10/31/13   Estill Dooms, MD  atorvastatin (LIPITOR) 10 MG tablet TAKE 1 TABLET (10 MG TOTAL) BY MOUTH DAILY. TO LOWER CHOLESTEROL 11/09/15   Estill Dooms, MD  Cholecalciferol (VITAMIN D-3) 1000 UNITS CAPS Take by mouth. Take one tablet by mouth once daily.    Historical Provider, MD  hydrochlorothiazide (HYDRODIURIL) 25 MG tablet TAKE 1 TABLET BY MOUTH EVERY DAY TO CONTROL BLOOD PRESSURE 11/09/15   Estill Dooms, MD  Multiple Vitamin (MULTIVITAMIN) tablet Take 1 tablet by mouth daily.    Historical Provider, MD  BP 178/91 mmHg  Pulse 48  Temp(Src) 97.9 F (36.6 C) (Oral)  Resp 18  Ht 5\' 11"  (1.803 m)  Wt 150 lb (68.04 kg)  BMI 20.93 kg/m2  SpO2 100% Physical Exam  Constitutional: He is oriented to person, place, and time. He appears well-developed and well-nourished.  HENT:  Head: Normocephalic and atraumatic.  Eyes: EOM are normal.  Neck: Normal range of motion.  Cardiovascular: Regular rhythm, normal heart sounds and intact distal pulses.   Bradycardia  Pulmonary/Chest: Effort normal and breath sounds normal. No respiratory distress.  Abdominal: Soft. He exhibits no distension. There is no tenderness.  Musculoskeletal: Normal range of motion.  Neurological: He is alert and oriented to person, place, and time.  Skin: Skin is warm  and dry.  Psychiatric: He has a normal mood and affect. Judgment normal.  Nursing note and vitals reviewed.   ED Course  Procedures (including critical care time)  CRITICAL CARE Performed by: Hoy Morn Total critical care time: 30 minutes Critical care time was exclusive of separately billable procedures and treating other patients. Critical care was necessary to treat or prevent imminent or life-threatening deterioration. Critical care was time spent personally by me on the following activities: development of treatment plan with patient and/or surrogate as well as nursing, discussions with consultants, evaluation of patient's response to treatment, examination of patient, obtaining history from patient or surrogate, ordering and performing treatments and interventions, ordering and review of laboratory studies, ordering and review of radiographic studies, pulse oximetry and re-evaluation of patient's condition.    Labs Review Labs Reviewed  I-STAT CHEM 8, ED - Abnormal; Notable for the following:    BUN 28 (*)    Glucose, Bld 103 (*)    Hemoglobin 17.7 (*)    All other components within normal limits  CBC  DIFFERENTIAL  PROTIME-INR  APTT  COMPREHENSIVE METABOLIC PANEL  TROPONIN I  LIPID PANEL  I-STAT TROPOININ, ED    Imaging Review No results found. I have personally reviewed and evaluated these images and lab results as part of my medical decision-making.   EKG Interpretation   Date/Time:  Monday November 19 2015 09:32:30 EDT Ventricular Rate:  70 PR Interval:  176 QRS Duration: 96 QT Interval:  378 QTC Calculation: 408 R Axis:   93 Text Interpretation:  * Critical Test Result: STEMI Sinus rhythm with  Premature supraventricular complexes Low voltage QRS Anterolateral infarct  , possibly acute ** ** ACUTE MI / STEMI ** ** Abnormal ECG changed from  prior ecg, lateral ST elevation Confirmed by Aleera Gilcrease  MD, Goldy Calandra ([id number]) on  11/19/2015 9:55:09 AM      MDM    Final diagnoses:  Acute coronary syndrome ([place])    Patient presents with symptoms consistent with acute coronary syndrome and an abnormal EKG concerning for lateral ST elevation.  This is changed from his prior EKG and thus a code STEMI was called.  Patient was given aspirin as well as 4000 unit heparin bolus.  He continues to have active chest discomfort at this time.   Dr Angelena Form, interventional cardiology, evaluated the patient at the bedside promptly and will take the patient emergently to the Cath Lab for heart catheterization.  Patient and family updated    Jola Schmidt, MD 11/19/15 1000

## 2015-11-19 NOTE — ED Notes (Signed)
Activated Code Stemi  

## 2015-11-19 NOTE — Progress Notes (Signed)
   11/19/15 1000  Clinical Encounter Type  Visited With Patient and family together  Visit Type ED  Referral From Nurse  Consult/Referral To Chaplain  Spiritual Encounters  Spiritual Needs Emotional  Stress Factors  Patient Stress Factors Health changes;Loss of control  Family Stress Factors Health changes   Chaplain responded to Code Stemi in ED.  Pt was present with family. Chaplain offered ministry of presence to family.  Pt taken upstairs to Cath Lab.  Chaplain walked family to Cath Lab waiting area and offered hospitality of care to family. Family did not want Chaplain to stay with her while waiting for update.   Vilinda Blanks [place] Wherry 11/19/2015 10:15 AM

## 2015-11-19 NOTE — Progress Notes (Signed)
HR in the 50s-low 60s w/ pvcs w/ order for NO BETA BLOCKER but w/ order for lopressor 25 -Dr Claiborne Billings notified w/ order to hold lopressor tonight and to check magnesium w/ next blood draw.

## 2015-11-19 NOTE — H&P (Signed)
History & Physical    Patient ID: Henry Franklin. MRN: [id number], DOB/AGE: December 27, 1926   Admit date: 11/19/2015   Primary Physician: Henry Dooms, MD Primary Cardiologist: New  Patient Profile    82 yo male with PMH of HTN/Stroke/TIA/HLD/Prostate Ca and idiopathic PE (previously on Eliquis stopped on 11/17/2015) who presented to the [facility] ED as a STEMI with central chest pain and radiation to the left side of the chest.  Past Medical History    Past Medical History  Diagnosis Date  . HTN (hypertension)   . Stroke ([place])   . TIA (transient ischemic attack)   . Hyperlipidemia   . Malignant neoplasm of prostate ([place])   . Essential and other specified forms of tremor   . Supraventricular premature beats   . Other premature beats   . Cardiac dysrhythmia, unspecified   . Acute, but ill-defined, cerebrovascular disease ([place])   . Unspecified late effects of cerebrovascular disease   . External hemorrhoids without mention of complication   . Unspecified anomaly of tooth position   . Cellulitis and abscess of unspecified site   . Sebaceous cyst   . Lumbago   . Nontraumatic rupture of tendons of biceps ([place])   . Other convulsions   . Dizziness and giddiness   . Other malaise and fatigue   . Palpitations   . Encounter for long-term (current) use of other medications     Past Surgical History  Procedure Laterality Date  . Prostate cancer    . Back surgery      with sciatica  . Appendectomy  1942  . Spine surgery N/A 2000    [place]     Allergies  No Known Allergies  History of Present Illness    [name] is an 82 yo male with PMH of HTN/Stroke/TIA/HLD/Prostate Ca and idiopathic PE (previously on Eliquis stopped on 11/17/2015) who is not currently followed by our service, but recently saw Dr. [name] relating to an idiopathic PE. States he had been on Eliquis for a period of time, and recently requested to stop this, via Scientist, forensic. Took last dose on  Saturday, but woke up this morning around 5am and began to develop anterior central chest discomfort that persisted. He denies having any previous hx of CAD. He did proceed to take a dose of hs Eliquis this morning when he developed the chest pain. Denies any dyspnea, light-headedness, palpitations, LEE or nausea/vomiting. Does have an echo on file from 06/2003 that shows an EF of 55-65%.   In the ED he was transported to the ED as a code STEMI and given 324 ASA and 4000 units of heparin. EKG showed lateral ST elevation. Rates pain 5/10. Was seen by Dr. Angelena Form and taken for emergent cath. Recommendations to follow.   Home Medications    Prior to Admission medications   Medication Sig Start Date End Date Taking? Authorizing Provider  apixaban (ELIQUIS) 5 MG TABS tablet Take 1 tablet (5 mg total) by mouth 2 (two) times daily. START 12/28 01/24/15   Henry Dooms, MD  atorvastatin (LIPITOR) 10 MG tablet TAKE 1 TABLET (10 MG TOTAL) BY MOUTH DAILY. TO LOWER CHOLESTEROL 10/31/13   Henry Dooms, MD  atorvastatin (LIPITOR) 10 MG tablet TAKE 1 TABLET (10 MG TOTAL) BY MOUTH DAILY. TO LOWER CHOLESTEROL 11/09/15   Henry Dooms, MD  Cholecalciferol (VITAMIN D-3) 1000 UNITS CAPS Take by mouth. Take one tablet by mouth once daily.  Historical Provider, MD  hydrochlorothiazide (HYDRODIURIL) 25 MG tablet TAKE 1 TABLET BY MOUTH EVERY DAY TO CONTROL BLOOD PRESSURE 11/09/15   Henry Dooms, MD  Multiple Vitamin (MULTIVITAMIN) tablet Take 1 tablet by mouth daily.    Historical Provider, MD    Family History    Family History  Problem Relation Age of Onset  . Cancer Father 17    brain tumor    Social History    Social History   Social History  . Marital Status: Married    Spouse Name: N/A  . Number of Children: N/A  . Years of Education: N/A   Occupational History  . retired    Social History Main Topics  . Smoking status: Never Smoker   . Smokeless tobacco: Never Used  . Alcohol Use: No  . Drug  Use: No  . Sexual Activity: Not on file   Other Topics Concern  . Not on file   Social History Narrative     Review of Systems    General:  No chills, fever, night sweats or weight changes.  Cardiovascular:  + chest pain, dyspnea on exertion, edema, orthopnea, palpitations, paroxysmal nocturnal dyspnea. Dermatological: No rash, lesions/masses Respiratory: No cough, dyspnea Urologic: No hematuria, dysuria Abdominal:   No nausea, vomiting, diarrhea, bright red blood per rectum, melena, or hematemesis Neurologic:  No visual changes, wkns, changes in mental status. All other systems reviewed and are otherwise negative except as noted above.  Physical Exam    Blood pressure 178/91, pulse 48, temperature 97.9 F (36.6 C), temperature source Oral, resp. rate 18, height 5\' 11"  (1.803 m), weight 150 lb (68.04 kg), SpO2 100 %.  General: Pleasant older male, NAD Psych: Normal affect. Neuro: Alert and oriented X 3. Moves all extremities spontaneously. HEENT: Normal  Neck: Supple without bruits or JVD. Lungs:  Resp regular and unlabored, CTA. Heart: RRR no s3, s4, or murmurs. Abdomen: Soft, non-tender, non-distended, BS + x 4.  Extremities: No clubbing, cyanosis or edema. DP/PT/Radials 2+ and equal bilaterally.  Labs    Troponin [facility] of Care Test)  Recent Labs  11/19/15 0945  TROPIPOC 0.01   No results for input(s): CKTOTAL, CKMB, TROPONINI in the last 72 hours. Lab Results  Component Value Date   WBC 9.1 11/19/2015   HGB 17.7* 11/19/2015   HCT 52.0 11/19/2015   MCV 93.1 11/19/2015   PLT 190 11/19/2015    Recent Labs Lab 11/19/15 0947  NA 143  K 3.9  CL 103  BUN 28*  CREATININE 1.00  GLUCOSE 103*   Lab Results  Component Value Date   CHOL 174 07/03/2014   HDL 52 07/03/2014   LDLCALC 100* 07/03/2014   TRIG 111 07/03/2014   Lab Results  Component Value Date   DDIMER 0.29 10/15/2015     Radiology Studies    No results found.  ECG & Cardiac Imaging      ECHO: 06/2003  SUMMARY - Overall left ventricular systolic function was normal. Left    ventricular ejection fraction was estimated , range being 55    % to 65 %. There were no left ventricular regional wall    motion abnormalities. Left ventricular wall thickness was    mildly increased. There was an increased relative    contribution of atrial contraction to left ventricular    filling. - Aortic valve thickness was mildly to moderately increased. There    was mild aortic valvular regurgitation. IMPRESSIONS - There was no echocardiographic evidence for  a cardiac source of    embolism.  EKG: SR Lateral ST elevation, Ventricular trigeminy   Assessment & Plan    Mr. Ginley is an 82 yo male with PMH of HTN/Stroke/TIA/HLD/Prostate Ca and idiopathic PE (previously on Eliquis stopped on 11/17/2015) who is not currently followed by our service, but recently saw Dr. [name] relating to an idiopathic PE. States he had been on Eliquis for a period of time, and recently requested to stop this, via Scientist, forensic. Took last dose on Saturday, but woke up this morning around 5am and began to develop anterior central chest discomfort that persisted. He denies having any previous hx of CAD. He did proceed to take a dose of hs Eliquis this morning when he developed the chest pain. Denies any dyspnea, light-headedness, palpitations, LEE or nausea/vomiting. Does have an echo on file from 06/2003 that shows an EF of 55-65%.    1. STEMI/Chest pain: Patient was transported for the cath lab for emergent cardiac catheterization with Dr. Angelena Form. Recommendations to follow post-procedure.   Barnet Pall, NP-C Pager [phone] 11/19/2015, 10:03 AM   I have personally seen and examined this patient with Reino Bellis, NP. I agree with the assessment and plan as outlined above. He is being admitted with chest pain and EKG changes worrisome for anterolateral  STEMI. Plan emergent cath this am.   [name] 11/19/2015 11:39 AM

## 2015-11-20 ENCOUNTER — Telehealth: Payer: Self-pay | Admitting: Pulmonary Disease

## 2015-11-20 DIAGNOSIS — I480 Paroxysmal atrial fibrillation: Secondary | ICD-10-CM | POA: Diagnosis not present

## 2015-11-20 DIAGNOSIS — I2129 ST elevation (STEMI) myocardial infarction involving other sites: Secondary | ICD-10-CM

## 2015-11-20 LAB — MAGNESIUM: Magnesium: 1.8 mg/dL (ref 1.7–2.4)

## 2015-11-20 LAB — BASIC METABOLIC PANEL
ANION GAP: 7 (ref 5–15)
BUN: 18 mg/dL (ref 6–20)
CALCIUM: 8.7 mg/dL — AB (ref 8.9–10.3)
CO2: 24 mmol/L (ref 22–32)
Chloride: 107 mmol/L (ref 101–111)
Creatinine, Ser: 1.05 mg/dL (ref 0.61–1.24)
GFR calc Af Amer: >60 mL/min (ref >60)
GFR calc non Af Amer: >60 mL/min (ref >60)
GLUCOSE: 108 mg/dL — AB (ref 65–99)
Potassium: 3.5 mmol/L (ref 3.5–5.1)
Sodium: 138 mmol/L (ref 135–145)

## 2015-11-20 LAB — CBC
HCT: 45.4 % (ref 39.0–52.0)
HEMOGLOBIN: 14.5 g/dL (ref 13.0–17.0)
MCH: 29.5 pg (ref 26.0–34.0)
MCHC: 31.9 g/dL (ref 30.0–36.0)
MCV: 92.5 fL (ref 78.0–100.0)
Platelets: 153 10*3/uL (ref 150–400)
RBC: 4.91 MIL/uL (ref 4.22–5.81)
RDW: 13.6 % (ref 11.5–15.5)
WBC: 10.2 10*3/uL (ref 4.0–10.5)

## 2015-11-20 LAB — LIPID PANEL
CHOLESTEROL: 113 mg/dL (ref 0–200)
HDL: 36 mg/dL — ABNORMAL LOW (ref >40)
LDL CALC: 56 mg/dL (ref 0–99)
Total CHOL/HDL Ratio: 3.1 RATIO
Triglycerides: 107 mg/dL (ref <150)
VLDL: 21 mg/dL (ref 0–40)

## 2015-11-20 LAB — TROPONIN I: TROPONIN I: 37.91 ng/mL — AB (ref <0.031)

## 2015-11-20 MED ORDER — METOPROLOL TARTRATE 12.5 MG HALF TABLET
12.5000 mg | ORAL_TABLET | Freq: Two times a day (BID) | ORAL | Status: DC
  Administered 2015-11-20 – 2015-11-22 (×4): 12.5 mg via ORAL
  Filled 2015-11-20 (×5): qty 1

## 2015-11-20 NOTE — Telephone Encounter (Signed)
Thanks for letting me know!

## 2015-11-20 NOTE — Telephone Encounter (Signed)
Spoke with pt and he wants to let BQ know that he had heart attack and stent placed this past Monday. He is due to be released tomorrow. Pt reports that they have stopped his Eliquis and placed him on Plavix. He just wanted to make sure you were aware. He states he is doing well and will see you at his f/u appt 7/6.   Routing to BQ as FYI.

## 2015-11-20 NOTE — Progress Notes (Signed)
Patient has had 2 low BP readings and HR < 60.  Patient is on Metoprolol 12.5 mg BID with no parameters. Paged on call cardiology. Dr. Saunders Revel stated it was okay to hold for tonight. Will hold metoprolol. Will continue to monitor.

## 2015-11-20 NOTE — Progress Notes (Signed)
EKG CRITICAL VALUE     12 lead EKG performed.  Critical value noted.  Fernand Parkins, RN notified.   Marieclaire Bettenhausen, CCT 11/20/2015 7:21 AM

## 2015-11-20 NOTE — Progress Notes (Addendum)
    Subjective:  Denies CP or dyspnea   Objective:  Filed Vitals:   11/20/15 0400 11/20/15 0500 11/20/15 0600 11/20/15 0700  BP: 101/60  107/73   Pulse: 56 28 58 83  Temp: 97.9 F (36.6 C)     TempSrc: Oral     Resp: 20 22 22 26   Height:      Weight:      SpO2: 94% 96% 95% 93%    Intake/Output from previous day:  Intake/Output Summary (Last 24 hours) at 11/20/15 0716 Last data filed at 11/20/15 0700  Gross per 24 hour  Intake    870 ml  Output   1325 ml  Net   -455 ml    Physical Exam: Physical exam: Well-developed well-nourished in no acute distress.  Skin is warm and dry.  HEENT is normal.  Neck is supple.  Chest is clear to auscultation with normal expansion.  Cardiovascular exam is regular rate and rhythm.  Abdominal exam nontender or distended. No masses palpated. Extremities show no edema. Radial cath site with no hematoma neuro grossly intact    Lab Results: Basic Metabolic Panel:  Recent Labs  11/19/15 0940 11/19/15 0947 11/19/15 1244 11/20/15 0009 11/20/15 0150  NA 143 143  --   --  138  K 3.6 3.9  --   --  3.5  CL 107 103  --   --  107  CO2 28  --   --   --  24  GLUCOSE 106* 103*  --   --  108*  BUN 19 28*  --   --  18  CREATININE 1.10 1.00 0.97  --  1.05  CALCIUM 9.7  --   --   --  8.7*  MG  --   --   --  1.8  --    CBC:  Recent Labs  11/19/15 0940  11/19/15 1244 11/20/15 0150  WBC 9.1  --  8.5 10.2  NEUTROABS 4.5  --   --   --   HGB 16.6  < > 15.5 14.5  HCT 51.6  < > 46.8 45.4  MCV 93.1  --  91.9 92.5  PLT 190  --  168 153  < > = values in this interval not displayed. Cardiac Enzymes:  Recent Labs  11/19/15 1244 11/19/15 1842 11/20/15 0009  TROPONINI 10.29* 41.80* 37.91*     Assessment/Plan:  1 s/p MI with PCI of diagonal - continue ASA, plavix and statin; decrease metoprolol to 12.5 BID; check echo; add ACEI later if BP allows; transfer to telemetry. 2 Hypertension - decrease metoprolol to 12.5 BID as BP and HR  borderline. 3 Hyperlipidemia - continue statin; check lipids and liver in 4 weeks 4 H/O PE - apixaban recently DCed by pulmonary.    Kirk Ruths 11/20/2015, 7:16 AM

## 2015-11-20 NOTE — Progress Notes (Signed)
CARDIAC REHAB PHASE I   PRE:  Rate/Rhythm: 58 SB PVC  BP:  Supine: 93/56  Sitting:   Standing:    SaO2: 100%RA  MODE:  Ambulation: 350 ft   POST:  Rate/Rhythm: 76 SR PVCs  BP:  Supine:   Sitting: 102/76  Standing:    SaO2: 98%RA 1000-1055 Pt walked 350 ft on RA with rolling walker. Has tendency to get walker too far. Pt never gave clear answer if he has walker at home or not. Stated he uses stationary bike for ex but does not walk a lot due to back issues. No CP. To recliner after walk with call bell. Began MI education with pt who asks lots of questions. Discussed importance of plavix with stent, MI restrictions, NTG use and heart healthy diet. Discussed CRP 2 and will refer to [place]. Pt excited about attending. Turned down pages in MI booklet for pt to review and wife to read. Pt had difficulty with teach back. Will need review.   Graylon Good, RN BSN  11/20/2015 10:51 AM

## 2015-11-20 NOTE — Progress Notes (Signed)
Pt is a fall risk and refuses to have bed alarm and chair alarm on.  Educated pt about safety importance with alarms and pt still refuses to have them on.  Will continue to monitor pt closely.

## 2015-11-20 NOTE — Care Management Important Message (Signed)
Important Message  Patient Details  Name: Henry Franklin. MRN: [id number] Date of Birth: 23-Mar-1927   Medicare Important Message Given:  Yes    Loann Quill 11/20/2015, 8:18 AM

## 2015-11-21 ENCOUNTER — Encounter (HOSPITAL_COMMUNITY): Payer: Self-pay | Admitting: *Deleted

## 2015-11-21 ENCOUNTER — Inpatient Hospital Stay (HOSPITAL_COMMUNITY): Payer: PPO

## 2015-11-21 DIAGNOSIS — I2510 Atherosclerotic heart disease of native coronary artery without angina pectoris: Secondary | ICD-10-CM | POA: Diagnosis not present

## 2015-11-21 DIAGNOSIS — I480 Paroxysmal atrial fibrillation: Secondary | ICD-10-CM

## 2015-11-21 DIAGNOSIS — I2129 ST elevation (STEMI) myocardial infarction involving other sites: Secondary | ICD-10-CM | POA: Diagnosis not present

## 2015-11-21 DIAGNOSIS — I4891 Unspecified atrial fibrillation: Secondary | ICD-10-CM

## 2015-11-21 LAB — ECHOCARDIOGRAM COMPLETE
Height: 71 in
WEIGHTICAEL: 2387.2 [oz_av]

## 2015-11-21 MED ORDER — HEPARIN SODIUM (PORCINE) 5000 UNIT/ML IJ SOLN
5000.0000 [IU] | Freq: Three times a day (TID) | INTRAMUSCULAR | Status: DC
  Administered 2015-11-21 (×2): 5000 [IU] via SUBCUTANEOUS
  Filled 2015-11-21 (×2): qty 1

## 2015-11-21 MED ORDER — POTASSIUM CHLORIDE CRYS ER 20 MEQ PO TBCR
40.0000 meq | EXTENDED_RELEASE_TABLET | Freq: Once | ORAL | Status: AC
  Administered 2015-11-21: 40 meq via ORAL
  Filled 2015-11-21: qty 2

## 2015-11-21 MED ORDER — PERFLUTREN LIPID MICROSPHERE
1.0000 mL | INTRAVENOUS | Status: AC | PRN
  Filled 2015-11-21: qty 10

## 2015-11-21 MED ORDER — PERFLUTREN LIPID MICROSPHERE
INTRAVENOUS | Status: AC
  Administered 2015-11-21: 3 mL
  Filled 2015-11-21: qty 10

## 2015-11-21 MED ORDER — AMIODARONE HCL 200 MG PO TABS
200.0000 mg | ORAL_TABLET | Freq: Two times a day (BID) | ORAL | Status: DC
  Administered 2015-11-21 – 2015-11-22 (×3): 200 mg via ORAL
  Filled 2015-11-21 (×3): qty 1

## 2015-11-21 MED FILL — Nitroglycerin IV Soln 100 MCG/ML in D5W: INTRA_ARTERIAL | Qty: 10 | Status: AC

## 2015-11-21 NOTE — Progress Notes (Signed)
Patient removed leads and portable tele box while using the bathroom. I advised the patient that we should put the leads back on in order to monitor his heart especially after post-MI. Patient stated he was going to get dressed and leave in order to go back to his home. Stated, "He was done with all of this and wanted to be left alone." He continuously refused to keep the leads on but did agree that he would spend the night in the hospital. Paged on-call cardiology, Dr. Saunders Revel. Shortly after, Dr. Saunders Revel came up to see and speak with the patient. Spoke with Dr. Saunders Revel once he left the patient's room. He stated that it was fine to leave him off cardiac telemetry for the night since all he wanted to do is rest. Patient is aware that he should call nurse if he needs assistance or any concerns. Will continue to monitor patient throughout the night.

## 2015-11-21 NOTE — Progress Notes (Signed)
CARDIAC REHAB PHASE I   PRE:  Rate/Rhythm: 61 SR PACS  BP:  Supine:   Sitting: 100/60  Standing:    SaO2:   MODE:  Ambulation: 550 ft   POST:  Rate/Rhythm: 63 ? afib  BP:  Supine:   Sitting: 120/74  Standing:    SaO2: 99%RA 1042-1110 Pt happy to walk. Walked 550 ft with rolling walker, gait belt use, and asst x 1. Pt has tendency to get walker too far out and he is easily distracted. Would recommend PT to evaluate gait and pt safety. To bed after walk with no CP.  Requested order from PA. Will follow up tomorrow for ed as wife not here now and pt did not know if coming today.   Graylon Good, RN BSN  11/21/2015 11:07 AM

## 2015-11-21 NOTE — Progress Notes (Signed)
    Subjective:  Denies CP or dyspnea   Objective:  Filed Vitals:   11/20/15 2129 11/20/15 2217 11/21/15 0521 11/21/15 0535  BP: 96/62 95/64 104/69   Pulse:   73   Temp:   98.9 F (37.2 C)   TempSrc:   Oral   Resp:      Height:      Weight:    149 lb 3.2 oz (67.677 kg)  SpO2:   94%     Intake/Output from previous day:  Intake/Output Summary (Last 24 hours) at 11/21/15 [id number] Last data filed at 11/21/15 0000  Gross per 24 hour  Intake    363 ml  Output      0 ml  Net    363 ml    Physical Exam: Physical exam: Well-developed well-nourished in no acute distress.  Skin is warm and dry.  HEENT is normal.  Neck is supple.  Chest is clear to auscultation with normal expansion.  Cardiovascular exam is regular rate and rhythm.  Abdominal exam nontender or distended. No masses palpated. Extremities show no edema. Radial cath site with no hematoma neuro grossly intact    Lab Results: Basic Metabolic Panel:  Recent Labs  11/19/15 0940 11/19/15 0947 11/19/15 1244 11/20/15 0009 11/20/15 0150  NA 143 143  --   --  138  K 3.6 3.9  --   --  3.5  CL 107 103  --   --  107  CO2 28  --   --   --  24  GLUCOSE 106* 103*  --   --  108*  BUN 19 28*  --   --  18  CREATININE 1.10 1.00 0.97  --  1.05  CALCIUM 9.7  --   --   --  8.7*  MG  --   --   --  1.8  --    CBC:  Recent Labs  11/19/15 0940  11/19/15 1244 11/20/15 0150  WBC 9.1  --  8.5 10.2  NEUTROABS 4.5  --   --   --   HGB 16.6  < > 15.5 14.5  HCT 51.6  < > 46.8 45.4  MCV 93.1  --  91.9 92.5  PLT 190  --  168 153  < > = values in this interval not displayed. Cardiac Enzymes:  Recent Labs  11/19/15 1244 11/19/15 1842 11/20/15 0009  TROPONINI 10.29* 41.80* 37.91*     Assessment/Plan:  1 s/p MI with PCI of diagonal - continue ASA, plavix and statin; continue metoprolol 12.5 BID as tolerated by BP; await echo; add ACEI later if BP allows as LV function reduced on cath. 2 Hypertension - BP borderline;  continue metoprolol as tolerated. 3 Hyperlipidemia - continue statin; check lipids and liver in 4 weeks 4 H/O PE - apixaban recently DCed by pulmonary. 5 PAF-pt with intermittent atrial fibrillation on telemetry. CHADSvasc 7. However, given age I am hesitant to treat with dual antiplt therapy and apixaban. Will continue asa and plavix for 30 days; then DC ASA and add apixaban to pts regimen at that time. Check echo and TSH. BP borderline and cannot advance metoprolol. Add amiodarone.    Henry Franklin 11/21/2015, 9:37 AM

## 2015-11-21 NOTE — Progress Notes (Addendum)
Assessed patient and vitals signs. HR was fluctuating on Dinamap. Checked radial pulse manually and could not get an accurate reading. Patient agreed to be hooked back on portable telemetry box. Patient was Sinus rhythm/NSR prior to him removing his leads and tele box. Did an EKG. Showed Atrial Fibrillation. Asked patient if he was symptomatic and he denied any symptoms. Paged cardiology on call, Dr. Saunders Revel and stated to monitor patient and he will pass it on to the morning cardiology rounding team. Will continue to monitor patient.

## 2015-11-21 NOTE — Progress Notes (Addendum)
On-Call Cardiology Note  I was contacted by the patient's nurse, who reports that Henry Franklin has been agitated and refusing to wear telemetry overnight.  He also threatened to go home.  Upon my arrival, the patient was lying comfortably and quietly in bed.  He states that he is unable to sleep well wearing the telemetry monitor and is not willing to try it again.  We discussed the rationale for telemetry monitoring, particularly in the acute post MI setting.  He voices understanding and reiterates his desire to be off telemetry in order to allow him to sleep.  I will place an off telemetry order while asleep, as he is more than 24 hours out from his PCI.  Patient was advised to contact his nurse if he has any questions or concerns.  Nelva Bush, MD Cardiology Fellow  Addendum (6:10 AM on 11/21/15): I was alerted by pt's RN this morning that Henry Franklin was noted to have irregular rhythm on exam.  Telemetry was reattached, demonstrating atrial fibrillation.  This was confirmed by EKG.  Patient asymptomatic and hemodynamically stable.  Will defer restarting apixaban in the setting of DAPT following recent STEMI and PCI to Dr. Angelena Form.  Nelva Bush, MD Cardiology Fellow

## 2015-11-21 NOTE — Progress Notes (Signed)
  Echocardiogram 2D Echocardiogram has been performed.  Donata Clay 11/21/2015, 10:30 AM

## 2015-11-22 ENCOUNTER — Telehealth: Payer: Self-pay | Admitting: Cardiovascular Disease

## 2015-11-22 ENCOUNTER — Encounter (HOSPITAL_COMMUNITY): Payer: Self-pay | Admitting: Cardiology

## 2015-11-22 ENCOUNTER — Other Ambulatory Visit: Payer: Self-pay

## 2015-11-22 DIAGNOSIS — I2109 ST elevation (STEMI) myocardial infarction involving other coronary artery of anterior wall: Secondary | ICD-10-CM | POA: Diagnosis not present

## 2015-11-22 LAB — CBC
HEMATOCRIT: 42.1 % (ref 39.0–52.0)
Hemoglobin: 13.6 g/dL (ref 13.0–17.0)
MCH: 29.6 pg (ref 26.0–34.0)
MCHC: 32.3 g/dL (ref 30.0–36.0)
MCV: 91.7 fL (ref 78.0–100.0)
PLATELETS: 153 10*3/uL (ref 150–400)
RBC: 4.59 MIL/uL (ref 4.22–5.81)
RDW: 13.9 % (ref 11.5–15.5)
WBC: 7.4 10*3/uL (ref 4.0–10.5)

## 2015-11-22 LAB — BASIC METABOLIC PANEL
Anion gap: 5 (ref 5–15)
BUN: 19 mg/dL (ref 6–20)
CHLORIDE: 108 mmol/L (ref 101–111)
CO2: 27 mmol/L (ref 22–32)
CREATININE: 1.09 mg/dL (ref 0.61–1.24)
Calcium: 8.7 mg/dL — ABNORMAL LOW (ref 8.9–10.3)
GFR calc Af Amer: >60 mL/min (ref >60)
GFR calc non Af Amer: 59 mL/min — ABNORMAL LOW (ref >60)
GLUCOSE: 98 mg/dL (ref 65–99)
POTASSIUM: 4.3 mmol/L (ref 3.5–5.1)
SODIUM: 140 mmol/L (ref 135–145)

## 2015-11-22 LAB — TSH: TSH: 2.934 u[IU]/mL (ref 0.350–4.500)

## 2015-11-22 MED ORDER — CLOPIDOGREL BISULFATE 75 MG PO TABS: 75.0000 mg | ORAL_TABLET | Freq: Every day | ORAL | Status: DC

## 2015-11-22 MED ORDER — METOPROLOL TARTRATE 25 MG PO TABS: 12.5000 mg | ORAL_TABLET | Freq: Two times a day (BID) | ORAL | Status: DC

## 2015-11-22 MED ORDER — LISINOPRIL 2.5 MG PO TABS
2.5000 mg | ORAL_TABLET | Freq: Every day | ORAL | Status: DC
  Administered 2015-11-22: 2.5 mg via ORAL
  Filled 2015-11-22: qty 1

## 2015-11-22 MED ORDER — ATORVASTATIN CALCIUM 80 MG PO TABS: 80.0000 mg | ORAL_TABLET | Freq: Every day | ORAL | Status: DC

## 2015-11-22 MED ORDER — NITROGLYCERIN 0.4 MG SL SUBL: 0.4000 mg | SUBLINGUAL_TABLET | SUBLINGUAL | Status: DC | PRN

## 2015-11-22 MED ORDER — AMIODARONE HCL 200 MG PO TABS
200.0000 mg | ORAL_TABLET | Freq: Two times a day (BID) | ORAL | Status: DC
Start: 2015-11-22 — End: 2015-12-03

## 2015-11-22 MED ORDER — LISINOPRIL 2.5 MG PO TABS: 2.5000 mg | ORAL_TABLET | Freq: Every day | ORAL | Status: DC

## 2015-11-22 MED ORDER — ASPIRIN 81 MG PO TBEC: 81.0000 mg | DELAYED_RELEASE_TABLET | Freq: Every day | ORAL | Status: DC

## 2015-11-22 NOTE — Progress Notes (Signed)
    Subjective:  Denies CP or dyspnea   Objective:  Filed Vitals:   11/22/15 0507 11/22/15 0510 11/22/15 0543 11/22/15 0841  BP: 97/45 101/49  106/56  Pulse: 62   60  Temp: 98.1 F (36.7 C)     TempSrc: Oral     Resp: 16     Height:      Weight:   148 lb 1.6 oz (67.178 kg)   SpO2: 96%       Intake/Output from previous day:  Intake/Output Summary (Last 24 hours) at 11/22/15 0914 Last data filed at 11/22/15 0847  Gross per 24 hour  Intake    960 ml  Output    100 ml  Net    860 ml    Physical Exam: Physical exam: Well-developed well-nourished in no acute distress.  Skin is warm and dry.  HEENT is normal.  Neck is supple.  Chest is clear to auscultation with normal expansion.  Cardiovascular exam is regular rate and rhythm.  Abdominal exam nontender or distended. No masses palpated. Extremities show no edema. Radial cath site with no hematoma neuro grossly intact    Lab Results: Basic Metabolic Panel:  Recent Labs  11/20/15 0009 11/20/15 0150 11/22/15 0431  NA  --  138 140  K  --  3.5 4.3  CL  --  107 108  CO2  --  24 27  GLUCOSE  --  108* 98  BUN  --  18 19  CREATININE  --  1.05 1.09  CALCIUM  --  8.7* 8.7*  MG 1.8  --   --    CBC:  Recent Labs  11/19/15 0940  11/20/15 0150 11/22/15 0431  WBC 9.1  < > 10.2 7.4  NEUTROABS 4.5  --   --   --   HGB 16.6  < > 14.5 13.6  HCT 51.6  < > 45.4 42.1  MCV 93.1  < > 92.5 91.7  PLT 190  < > 153 153  < > = values in this interval not displayed. Cardiac Enzymes:  Recent Labs  11/19/15 1244 11/19/15 1842 11/20/15 0009  TROPONINI 10.29* 41.80* 37.91*     Assessment/Plan:  1 s/p MI with PCI of diagonal - continue ASA, plavix and statin; continue metoprolol 12.5 BID; echo shows reduced LV function; add lisinopril 2.5 mg daily; follow BP closely as outpt. 2 Hypertension - BP borderline; continue metoprolol and add lisinopril; follow BP closely following DC. 3 Hyperlipidemia - continue statin; check  lipids and liver in 4 weeks 4 H/O PE - apixaban recently DCed by pulmonary. 5 PAF-pt with intermittent atrial fibrillation on telemetry previously; no afib over past 24 hrs. CHADSvasc 7. However, given age I am hesitant to treat with dual antiplt therapy and apixaban. Will continue asa and plavix for 30 days; then DC ASA and add apixaban to pts regimen at that time. Continue amiodarone (200 mg BID for 2 weeks and then 200 mg daily) for 6-8 weeks and then DC (PAF possibly related to recent MI). DC today with TOC appt one week and bmet at that time; FU Dr Angelena Form 8-12 weeks. > 30 min PA and physician time D2    Henry Franklin 11/22/2015, 9:14 AM

## 2015-11-22 NOTE — Care Management Note (Signed)
Case Management Note  Patient Details  Name: Henry Franklin. MRN: [id number] Date of Birth: 1926-10-31  Subjective/Objective:  Pt admitted for [facility]- plan for d/c on Plavix home with wife.                   Action/Plan: CM did make Ambulatory Referral to [facility] on [place] for Outpatient Physical Therapy. Office to call pt with a follow up time. Pt had concerns with Cardiac Rehab and dates of initiation. Cardiac Rehab did come by to speak with pt. Wife to provide transportation home. No further needs from CM at this time.   Expected Discharge Date:                  Expected Discharge Plan:  Home/Self Care  In-House Referral:  NA  Discharge planning Services  CM Consult  Post Acute Care Choice:  NA Choice offered to:  NA  DME Arranged:  N/A DME Agency:  NA  HH Arranged:  NA HH Agency:  NA  Status of Service:  Completed, signed off  If discussed at [place] of Stay Meetings, dates discussed:    Additional Comments:  Bethena Roys, RN 11/22/2015, 12:00 PM

## 2015-11-22 NOTE — Care Management Important Message (Signed)
Important Message  Patient Details  Name: Henry Franklin. MRN: [id number] Date of Birth: 1927-05-22   Medicare Important Message Given:  Yes    Bethena Roys, RN 11/22/2015, 11:59 AM

## 2015-11-22 NOTE — Discharge Summary (Signed)
Discharge Summary    Patient ID: Henry Deerwester.,  MRN: [id number], DOB/AGE: [birth year] 82 y.o.  Admit date: 11/19/2015 Discharge date: 11/22/2015  Primary Care Provider: Estill Dooms Primary Cardiologist: Dr. Angelena Form  Discharge Diagnoses    Active Problems:   Acute ST elevation myocardial infarction (STEMI) of anterolateral wall (HCC)   STEMI (ST elevation myocardial infarction) ([place])   Hyperlipidemia   HTN (hypertension)   Atrial fibrillation (HCC)   Allergies No Known Allergies  Diagnostic Studies/Procedures    LHC: 11/19/2015  Conclusion     Prox RCA to Dist RCA lesion, 20% stenosed.  Ost RPDA lesion, 40% stenosed.  Ost LAD to Mid LAD lesion, 10% stenosed.  Mid LAD lesion, 50% stenosed.  Dist LAD lesion, 20% stenosed.  2nd Diag lesion, 70% stenosed.  Lat 2nd Diag lesion, 100% stenosed. Post intervention, there is a 0% residual stenosis.  There is mild to moderate left ventricular systolic dysfunction.  1. Acute anterolateral STEMI 2. Occluded superior sub-branch of the moderate caliber Diagonal branch.  3. Successful PTCA/DES x 1 Diagonal 1 4. Moderate stenosis mid LAD (appears to be non flow limiting) 5. Mild non-obstructive disease RCA 6. Mild to moderate segmental LV systolic dysfunction 7. SVT during intervention  Recommendations: Will admit to CCU. Continue ASA and Plavix. Will start beta blocker and statin. Echo in am. Anticipate d/c home 24-48 hours.     Coronary Diagrams    Diagnostic Diagram           Post-Intervention Diagram           2D Echo: 11/21/2015  Study Conclusions  - Left ventricle: MId and distal anterior/inferior wall hypokinesis  as well as septal and apical hypokinesis. The cavity size was  moderately dilated. There was mild concentric hypertrophy.  Systolic function was moderately to severely reduced. The  estimated ejection fraction was in the range of 30% to 35%. Left  ventricular  diastolic function parameters were normal. - Left atrium: The atrium was mildly dilated. - Atrial septum: No defect or patent foramen ovale was identified. 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 [name] relating to an idiopathic PE. States he had been on Eliquis for a period of time, and recently requested to stop this, via Scientist, forensic. Took last dose on Saturday, but woke up this morning around 5am and began to develop anterior central chest discomfort that persisted. He denies having any previous hx of CAD. He did proceed to take a dose of hs Eliquis this morning when he developed the chest pain. Denies any dyspnea, light-headedness, palpitations, LEE or nausea/vomiting. Does have an echo on file from 06/2003 that shows an EF of 55-65%.   In the ED he was transported to the ED as a code STEMI and given 324 ASA and 4000 units of heparin. EKG showed Anterolateral ST elevation. Rates pain 5/10. Was seen by Dr. Angelena Form and taken for emergent cath.  Hospital Course     Consultants: PT  Mr. Arredondo underwent a left heart cath Dr. Angelena Form that showed occluded superior subbranch of moderate caliber diagonal branch, had successful PTCA/DES to diagonal branch. Had moderate stenosis of the mid LAD which did not appear to limit flow along with mild nonobstructive disease to the RCA. He did have  SVT during his intervention. Plans were to continue aspirin and Plavix along with starting beta blocker and statin. He's initially started on metoprolol 25 mg twice a day but this was decreased to 12.5 mg BID in the setting of borderline low blood pressure and heart rate on 11/20/2015. He was walked with cardiac rehabilitation using a walker and denied any chest pain or dyspnea. Did have a tendency to push the  walker a little further than comfortable reach. PT was asked to evaluate the patient and recommended further outpatient PT. He was transferred to telemetry on 11/21/2015. Early that morning he became slightly agitated and refused to wear his telemetry monitor. Our service was paged around 6 AM that morning stating that he was noted to have a regular rhythm on telemetry demonstrated new onset of atrial fibrillation. This was further confirmed by EKG, but patient was asymptomatic and hemodynamically stable. At that time his chads score was 7 but given his advanced age and dual antiplatelet therapy, Dr. Stanford Breed was hesitant to start him on apixaban. Of note he had recently stopped outpatient apixaban the Saturday prior to him presenting with STEMI, he was on this for history of PE and had been cleared from pulmonology to DC this medication. Given his blood pressure was borderline his metoprolol was not advance but amiodarone was added. Follow-up echo which showed reduced LV function of 30-35%.   On 11/22/2015 we added 2.5 mg of lisinopril daily, with plans to continue his amiodarone at 200 mg twice a day for 2 weeks and then 200 mg daily for 6-8 weeks with a possible discontinuation afterwards. Plan to continue aspirin and Plavix for 30 days, and then DC aspirin and add apixaban to his regimen at that time. His has had not episodes of A. Fib on telemetry over the past 24 hours prior to discharge. He was seen by Dr. Stanford Breed and determined stable for discharge. I have made arrangements for [facility] appointment and follow-up with Dr. Angelena Form in 3 months. He will need follow up lipids and LFTs in 4 weeks, along with follow up BMET at office appt.  _____________  Discharge Vitals Blood pressure 106/56, pulse 60, temperature 98.1 F (36.7 C), temperature source Oral, resp. rate 16, height 5\' 11"  (1.803 m), weight 148 lb 1.6 oz (67.178 kg), SpO2 96 %.  Filed Weights   11/19/15 1230 11/21/15 0535 11/22/15 0543  Weight:  150 lb (68.04 kg) 149 lb 3.2 oz (67.677 kg) 148 lb 1.6 oz (67.178 kg)    Labs & Radiologic Studies    CBC  Recent Labs  11/20/15 0150 11/22/15 0431  WBC 10.2 7.4  HGB 14.5 13.6  HCT 45.4 42.1  MCV 92.5 91.7  PLT 153 0000000   Basic Metabolic Panel  Recent Labs  11/20/15 0009 11/20/15 0150 11/22/15 0431  NA  --  138 140  K  --  3.5 4.3  CL  --  107 108  CO2  --  24 27  GLUCOSE  --  108* 98  BUN  --  18 19  CREATININE  --  1.05 1.09  CALCIUM  --  8.7* 8.7*  MG 1.8  --   --    Cardiac Enzymes  Recent Labs  11/19/15 1842 11/20/15 0009  TROPONINI 41.80* 37.91*   Fasting Lipid Panel  Recent Labs  11/20/15 0150  CHOL 113  HDL 36*  LDLCALC 56  TRIG 107  CHOLHDL 3.1   Thyroid Function Tests  Recent Labs  11/22/15 0431  TSH 2.934   _____________  No results found. Disposition   Pt is being discharged home today in good condition.  Follow-up Plans & Appointments    Follow-up Information    Follow up with [facility].   Specialty:  Rehabilitation   Why:  Office will call you for Physical Therapy Evaluation and Treatment. If office has not called within 2-3 business days please call them.    Contact information:   [address] [place] [id number] [place] [id number] [phone]      Follow up with Richardson Dopp, PA-C On 12/03/2015.   Specialties:  Cardiology, Physician Assistant   Why:  12:15am with Dr. Camillia Herter PA for your hospital follow up   Contact information:   [address] [address] [phone]       Follow up with [name], MD On 02/15/2016.   Specialty:  Cardiology   Why:  4:15pm for your routine follow up   Contact information:   [place] [address] [place] [address] [phone]      Discharge Instructions    Amb Referral to Cardiac Rehabilitation    Complete by:  As directed   Diagnosis:   STEMI Coronary Stents        Ambulatory referral to Physical Therapy    Complete by:  As directed   Physical Therapy Evaluation and Treatment.     Diet - low sodium heart healthy    Complete by:  As directed      Discharge instructions    Complete by:  As directed   !!!For your Amiodarone please take 1 table twice a day for the next 2 weeks, and then only take 1 tablet a day.   Radial Site Care Refer to this sheet in the next few weeks. These instructions provide you with information on caring for yourself after your procedure. Your caregiver may also give you more specific instructions. Your treatment has been planned according to current medical practices, but problems sometimes occur. Call your caregiver if you have any problems or questions after your procedure. HOME CARE INSTRUCTIONS You may shower the day after the procedure.Remove the bandage (dressing) and gently wash the site with plain soap and water.Gently pat the site dry.  Do not apply powder or lotion to the site.  Do not submerge the affected site in water for 3 to 5 days.  Inspect the site at least twice daily.  Do not flex or bend the affected arm for 24 hours.  No lifting over 5 pounds (2.3 kg) for 5 days after your procedure.  Do not drive home if you are discharged the same day of the procedure. Have someone else drive you.  You may drive 24 hours after the procedure unless otherwise instructed by your caregiver.  What to expect: Any bruising will usually fade within 1 to 2 weeks.  Blood that collects in the tissue (hematoma) may be painful to the touch. It should usually decrease in size and tenderness within 1 to 2 weeks.  SEEK IMMEDIATE MEDICAL CARE IF: You have unusual pain at the radial site.  You have redness, warmth, swelling, or pain at the radial site.  You have drainage (other than a small amount of blood on the dressing).  You have chills.  You have a fever or persistent symptoms for more than 72 hours.  You have a fever and  your symptoms suddenly get worse.  Your arm becomes pale,  cool, tingly, or numb.  You have heavy bleeding from the site. Hold pressure on the site.     Increase activity slowly    Complete by:  As directed            Discharge Medications   Current Discharge Medication List    START taking these medications   Details  amiodarone (PACERONE) 200 MG tablet Take 1 tablet (200 mg total) by mouth 2 (two) times daily. Qty: 60 tablet, Refills: 6    aspirin EC 81 MG EC tablet Take 1 tablet (81 mg total) by mouth daily.    clopidogrel (PLAVIX) 75 MG tablet Take 1 tablet (75 mg total) by mouth daily with breakfast. Qty: 30 tablet, Refills: 11    lisinopril (PRINIVIL,ZESTRIL) 2.5 MG tablet Take 1 tablet (2.5 mg total) by mouth daily. Qty: 30 tablet, Refills: 11    metoprolol tartrate (LOPRESSOR) 25 MG tablet Take 0.5 tablets (12.5 mg total) by mouth 2 (two) times daily. Qty: 60 tablet, Refills: 11    nitroGLYCERIN (NITROSTAT) 0.4 MG SL tablet Place 1 tablet (0.4 mg total) under the tongue every 5 (five) minutes x 3 doses as needed for chest pain. Qty: 25 tablet, Refills: 3      CONTINUE these medications which have CHANGED   Details  atorvastatin (LIPITOR) 80 MG tablet Take 1 tablet (80 mg total) by mouth daily at 6 PM. Qty: 30 tablet, Refills: 11      CONTINUE these medications which have NOT CHANGED   Details  Cholecalciferol (VITAMIN D-3) 1000 UNITS CAPS Take 1 capsule by mouth daily. Take one tablet by mouth once daily.    Multiple Vitamin (MULTIVITAMIN) tablet Take 1 tablet by mouth daily.      STOP taking these medications     hydrochlorothiazide (HYDRODIURIL) 25 MG tablet      apixaban (ELIQUIS) 5 MG TABS tablet          Aspirin prescribed at discharge?  Yes High Intensity Statin Prescribed? (Lipitor 40-80mg  or Crestor 20-40mg ): Yes Beta Blocker Prescribed? Yes For EF <40%, was ACEI/ARB Prescribed? Yes ADP Receptor Inhibitor Prescribed? (i.e. Plavix  etc.-Includes Medically Managed Patients): Yes For EF <40%, Aldosterone Inhibitor Prescribed? No:  Was EF assessed during THIS hospitalization? Yes Was Cardiac Rehab II ordered? (Included Medically managed Patients): Yes   Outstanding Labs/Studies   BMET at office visit, Lipids and LFTs in 4 weeks.  Duration of Discharge Encounter   Greater than 30 minutes including physician time.  Signed, Reino Bellis NP-C 11/22/2015, 1:33 PM

## 2015-11-22 NOTE — Telephone Encounter (Signed)
New message  TCM  Date: 12/03/2015 Status: Sch Time: 12:15 PM   Liliane Shi, PA-C  [facility]    6-22 [place]/TCM/ks  Made On: 11/22/2015 10:27 AM

## 2015-11-22 NOTE — Evaluation (Signed)
Physical Therapy Evaluation Patient Details Name: Henry Franklin. MRN: [id number] DOB: 1926-12-13 Today's Date: 11/22/2015   History of Present Illness  82 yo male with PMH of HTN/Stroke/TIA/HLD/Prostate Ca and idiopathic PE (previously on Eliquis stopped on 11/17/2015) who presented to the [facility] ED as a STEMI with central chest pain and radiation to the left side of the chest.  Also with h/o SS and recent PAF.  Clinical Impression  Patient presents with longstanding balance issues as well as postural deficit related to spinal stenosis.  Feel continuing balance therapy in the outpatient setting prior to initiating cardiac rehab may benefit pt to decrease fall risk and improve safety.  No further acute PT at this time due to planned d/c today.     Follow Up Recommendations Outpatient PT    Equipment Recommendations  None recommended by PT    Recommendations for Other Services       Precautions / Restrictions Precautions Precautions: Fall Precaution Comments: 1 fall in 6 months in bathroom; usually falls posterior      Mobility  Bed Mobility               General bed mobility comments: NT sitting in chair dressed ready for d/c  Transfers Overall transfer level: Modified independent               General transfer comment: pushed up on armrests from low chair no assist  Ambulation/Gait Ambulation/Gait assistance: Supervision;Min guard Ambulation Distance (Feet): 150 Feet Assistive device: None Gait Pattern/deviations: Step-through pattern;Trunk flexed;Drifts right/left;Decreased stride length     General Gait Details: no LOB, but veers to sides at times, minguard for straight path intermittently  Stairs Stairs: Yes Stairs assistance: Supervision Stair Management: Alternating pattern;Sideways;Forwards Number of Stairs: 5 General stair comments: one rail, no LOB, assist for safety; cued that he can place two hands on one rail if needed; started down stairs  like this, then removed one hand to allow step through pattern  Wheelchair Mobility    Modified Rankin (Stroke Patients Only)       Balance Overall balance assessment: Needs assistance         Standing balance support: No upper extremity supported Standing balance-Leahy Scale: Fair Standing balance comment: flexed posture with h/o stenosis; note he leans back against the wall when standing longer periods and wanting to make eye contact                             Pertinent Vitals/Pain Pain Assessment: No/denies pain    Home Living Family/patient expects to be discharged to:: Private residence Living Arrangements: Spouse/significant other Available Help at Discharge: Family;Available 24 hours/day Type of Home: House Home Access: Stairs to enter Entrance Stairs-Rails:  (one rail in middle of stairs) Technical brewer of Steps: 5 Home Layout: One level Home Equipment: Cane - single point;Grab bars - tub/shower      Prior Function Level of Independence: Independent         Comments: gave up tennis couple of years ago due to falling backwards     Hand Dominance   Dominant Hand: Right    Extremity/Trunk Assessment   Upper Extremity Assessment: RUE deficits/detail RUE Deficits / Details: WFL, but reports h/o biceps tear years ago; bilateral tremor         Lower Extremity Assessment: RLE deficits/detail;LLE deficits/detail RLE Deficits / Details: strength and AROM WFL, decreased coordination B with toe taps, reports h/o R  sciatica prior to surgical decompression years ago, but had numbness at that time (noted some foot slap on R more than L LLE Deficits / Details: strength and AROM WFL, decreased coordination B with toe taps, reports h/o R sciatica prior to surgical decompression years ago, but had numbness at that time (noted some foot slap on R more than L  Cervical / Trunk Assessment: Other exceptions  Communication   Communication: No  difficulties  Cognition Arousal/Alertness: Awake/alert Behavior During Therapy: WFL for tasks assessed/performed Overall Cognitive Status: History of cognitive impairments - at baseline       Memory: Decreased short-term memory              General Comments General comments (skin integrity, edema, etc.): Discussed outpatient balance therapy with printed referral given to patient.  Discussed may want to pursue after cardiac rehab.  However, cardiac rehab nurse reports will take about a month to get scheduled for outpatient cardiac rehab so spoke with case manager about making referral.    Exercises        Assessment/Plan    PT Assessment All further PT needs can be met in the next venue of care  PT Diagnosis Abnormality of gait   PT Problem List Decreased balance;Decreased coordination;Decreased safety awareness;Decreased activity tolerance  PT Treatment Interventions     PT Goals (Current goals can be found in the Care Plan section) Acute Rehab PT Goals PT Goal Formulation: All assessment and education complete, DC therapy    Frequency     Barriers to discharge        Co-evaluation               End of Session Equipment Utilized During Treatment: Gait belt Activity Tolerance: Patient tolerated treatment well Patient left: in chair;with call bell/phone within reach Nurse Communication: Mobility status         Time: 8347-5830 PT Time Calculation (min) (ACUTE ONLY): 26 min   Charges:   PT Evaluation $PT Eval Moderate Complexity: 1 Procedure PT Treatments $Gait Training: 8-22 mins   PT G CodesReginia Naas 12/10/2015, 10:31 AM  Magda Kiel, [place] 12/10/2015

## 2015-11-22 NOTE — Progress Notes (Signed)
[id number] Education completed with pt and wife who voiced understanding. Stressed importance of taking plavix with stent. Reviewed NTG use, MI restrictions, ex ed and heart healthy diet. Gave CHF booklet and discussed zones and when to call MD. Encouraged daily weights and 2000 mg sodium restriction and 2 FR restriction. Discussed CRP 2 again and referring to [facility].  Graylon Good RN BSN 11/22/2015 2:44 PM

## 2015-11-23 NOTE — Telephone Encounter (Signed)
Patient contacted regarding discharge from [facility] on 11/22/15.  Patient understands to follow up with provider Richardson Dopp PA on 12/03/2015 at 12:15 at [address]. Patient understands discharge instructions? yes Patient understands medications and regiment? yes Patient understands to bring all medications to this visit? yes  Patient had questions about sodium intake. Informed patient to look at labels on food products. Low salt diet is usually under 2000 mg of sodium. Informed patient to follow diet directs given at hospital, and not to add any table salt. Patient verbalized understanding.

## 2015-11-26 ENCOUNTER — Telehealth: Payer: Self-pay | Admitting: Cardiovascular Disease

## 2015-11-26 NOTE — Telephone Encounter (Signed)
Mr. Duchateau was discharged and was supposed to have a prescription for Nitro and also is not understanding his medications. Please call  Thanks

## 2015-11-26 NOTE — Telephone Encounter (Signed)
Scheduling a visit with the PharmD the same day he sees me is an excellent idea.  The PharmD is a great resource who can meet with the patient to review all medications and help make sure the patient knows what each drug is for.   I can certainly review his medications with him if he does not want to schedule with the PharmD. Richardson Dopp, PA-C   11/26/2015 4:45 PM

## 2015-11-26 NOTE — Telephone Encounter (Signed)
Chart reviewed and prescription for NTG was [facility] to [facility] on [facility]. I spoke with [facility] and confirmed pt picked this medication up. I spoke with [place] who is the transition of care nurse. She does not do home visits but speaks with the pt by phone.  I told her pt picked up NTG. She will check with pt on this. Crystal reports pt is taking his medication correctly but seems overwhelmed by medications.  He asks over and over why he is taking each medication.  Crystal has repeatedly explained medications to pt but feels he needs a visit with pharmacist or possibly home health nurse or [place] referral.

## 2015-11-26 NOTE — Telephone Encounter (Signed)
I spoke with Fuller [place], PharmD and pt can be scheduled for visit with pharmacist to review medications. This can be done day of visit with Richardson Dopp, PA on 12/03/15 or pt can be scheduled earlier if he would like visit this week.  I called and spoke with pt.  I told him [facility] reports [facility]. He will check with his wife since she picked up his medications. He reports he is not having any chest pain.  I explained to pt about meeting with pharmacist to go over medications. He reports he does not want to schedule appt at this time.  I told him this could be dicussed with Richardson Dopp, PA at appt on 12/03/15 and appt could be scheduled at that time if pt and provider felt it was indicated.  I spoke with Crystal and let her know pt declined visit with pharmacist.

## 2015-11-27 ENCOUNTER — Telehealth: Payer: Self-pay | Admitting: Cardiovascular Disease

## 2015-11-27 ENCOUNTER — Telehealth (HOSPITAL_COMMUNITY): Payer: Self-pay | Admitting: *Deleted

## 2015-11-27 NOTE — Telephone Encounter (Signed)
New message    The manage team needs prior Authorization for cardiac rehab for the pt starting on June 29 th at 2:45. So the pt can start cardiac rehab.

## 2015-11-27 NOTE — Telephone Encounter (Signed)
Will forward to precert

## 2015-11-28 NOTE — Telephone Encounter (Signed)
"  Per [employer], cpt code [phone] does not require Pre-Authorization." "This request has been closed as No Precertification is Required for Outpatient Physical Therapy in an In Network Physical Therapy provider." Closed [id number]

## 2015-11-29 ENCOUNTER — Ambulatory Visit: Payer: PPO | Attending: Cardiovascular Disease | Admitting: Physical Therapy

## 2015-11-29 VITALS — BP 184/86 | HR 57

## 2015-11-29 DIAGNOSIS — M545 Low back pain, unspecified: Secondary | ICD-10-CM

## 2015-11-29 DIAGNOSIS — Z9181 History of falling: Secondary | ICD-10-CM | POA: Diagnosis not present

## 2015-11-29 DIAGNOSIS — R262 Difficulty in walking, not elsewhere classified: Secondary | ICD-10-CM | POA: Insufficient documentation

## 2015-11-29 DIAGNOSIS — R2689 Other abnormalities of gait and mobility: Secondary | ICD-10-CM | POA: Insufficient documentation

## 2015-11-29 DIAGNOSIS — R2681 Unsteadiness on feet: Secondary | ICD-10-CM | POA: Diagnosis not present

## 2015-11-29 NOTE — Therapy (Signed)
[facility] [address] [place], [place], [address] Phone: [phone]   Fax:  [phone]  Physical Therapy Evaluation  Patient Details  Name: Henry Franklin. MRN: [id number] Date of Birth: Apr 16, 1927 Referring Provider: [name] MD  Encounter Date: 11/29/2015      PT End of Session - 11/29/15 1643    Visit Number 1   Number of Visits 17   Date for PT Re-Evaluation 01/25/16   Authorization Type Medicare- G codes needed   PT Start Time [id number]   PT Stop Time 1530   PT Time Calculation (min) 57 min   Equipment Utilized During Treatment Gait belt   Activity Tolerance Patient tolerated treatment well   Behavior During Therapy [facility] for tasks assessed/performed      Past Medical History  Diagnosis Date  . HTN (hypertension)   . Stroke ([place])   . TIA (transient ischemic attack)   . Hyperlipidemia   . Malignant neoplasm of prostate ([place])   . Essential and other specified forms of tremor   . Supraventricular premature beats   . Other premature beats   . Cardiac dysrhythmia, unspecified   . Acute, but ill-defined, cerebrovascular disease ([place])   . Unspecified late effects of cerebrovascular disease   . External hemorrhoids without mention of complication   . Unspecified anomaly of tooth position   . Cellulitis and abscess of unspecified site   . Sebaceous cyst   . Lumbago   . Nontraumatic rupture of tendons of biceps (long head)   . Other convulsions   . Dizziness and giddiness   . Other malaise and fatigue   . Palpitations   . Encounter for long-term (current) use of other medications   . CAD (coronary artery disease)     a. PCI with DES to lateral 2nd Diag LAD 11/19/2015 EF 30-35%   . Paroxysmal atrial fibrillation ([place])     11/21/2015    Past Surgical History  Procedure Laterality Date  . Prostate cancer    . Back surgery      with sciatica  . Appendectomy  1942  . Spine surgery N/A 2000     [place]  . Cardiac catheterization N/A 11/19/2015    Procedure: Left Heart Cath and Coronary Angiography;  Surgeon: Burnell Blanks, MD;  Location: [facility];  Service: Cardiovascular;  Laterality: N/A;  . Cardiac catheterization N/A 11/19/2015    Procedure: Coronary Stent Intervention;  Surgeon: Burnell Blanks, MD;  Location: [facility];  Service: Cardiovascular;  Laterality: N/A;    Filed Vitals:   11/29/15 1501 11/29/15 1516 11/29/15 1525  BP: 148/77  184/86  Pulse: 45 53 57  SpO2: 94% 96% 98%         Subjective Assessment - 11/29/15 1445    Subjective The pt is a 82 yr old male referred after MI  on 11/19/15 and A fib with heart catherization. The patient was hospitalized from 11/19/15 to 11/22/15. The patient reports that prior to his MI he was active, riding his bicycle and cutting the grass. He had pain wtih walking long distances but was able to get into and out of resturants with no pain. He has had a back sx 15 yrs ago and has felt fine but his pain and balance have gotten worse over the last 2 years. He was referred to Cardiac Rehab but his balance was too impaired for safe participation. So patient was referred to PT for balance  and gait training.    Patient is accompained by: Family member  wife    Pertinent History HTN, CVA, TIA, HLD, prostate CA, idiopathic PE, back sx in 2000   Limitations Standing;Walking;Lifting   Patient Stated Goals To improve balance and walking   Currently in Pain? Yes   Pain Score 6   initially no pain but 6/10 with gait & balance assessment   Pain Location Back   Pain Orientation Lower   Pain Descriptors / Indicators Sharp   Pain Type Chronic pain   Pain Onset More than a month ago   Pain Frequency Intermittent   Aggravating Factors  standing & gait for longer periods   Pain Relieving Factors sitting, rest   Effect of Pain on Daily Activities limits how far he walks   Multiple Pain Sites No             [facility] PT Assessment - 11/29/15 1445    Assessment   Medical Diagnosis Gait abnormality, Hx of CVA   Referring Provider [name] MD   Onset Date/Surgical Date 11/19/15   Hand Dominance Right   Precautions   Precaution Comments Lifting <5 lbs   Restrictions   Weight Bearing Restrictions No   Balance Screen   Has the patient fallen in the past 6 months Yes   How many times? 1  "I slipped in bathroom" hit head on back door   Has the patient had a decrease in activity level because of a fear of falling?  Yes   Is the patient reluctant to leave their home because of a fear of falling?  No   Home Environment   Living Environment Private residence   Living Arrangements Spouse/significant other  son lives across street   Available Help at Discharge Family   Type of [place] to enter   Entrance Stairs-Number of Steps 5   Entrance Stairs-Rails --  centered so either side   Home Layout One level   [place] - single point;Grab bars - tub/shower;Shower seat   Prior Function   Level of Independence Independent;Independent with gait;Independent with community mobility without device   Vocation Retired   Chief Technology Officer, cutting the grass, tennis   ROM / Strength   AROM / PROM / Strength AROM   AROM   Overall AROM  Within functional limits for tasks performed   Overall AROM Comments assessed hip flexion, knee flexion/extension, ankle dorsiflexion and plantarflexion   no resistance due to weight lifting restrictions   Transfers   Transfers Sit to Stand;Stand to Sit   Sit to Stand 5: Supervision;Without upper extremity assist;From chair/3-in-1   Sit to Stand Details (indicate cue type and reason) Pt was able to perform sit to stand without the use of arm rests   Stand to Sit 5: Supervision;Without upper extremity assist;To chair/3-in-1   Ambulation/Gait   Ambulation/Gait Yes   Ambulation/Gait Assistance 4: Min guard;4: Min assist  minA for  balance losses   Ambulation Distance (Feet) 320 Feet   Assistive device None   Gait Pattern Step-through pattern;Decreased arm swing - right;Decreased arm swing - left;Decreased stride length;Shuffle;Trunk flexed;Decreased trunk rotation   Ambulation Surface Level;Indoor   Gait velocity 2.32 ft/s   Stairs Yes   Stairs Assistance 5: Supervision   Stairs Assistance Details (indicate cue type and reason) Pt required use of rails and was unable to perform alternating step pattern    Stair Management Technique Two rails;Step to pattern;One rail  Left  2 hands on left rail when simulated home set-up   Number of Stairs 4   Balance   Balance Assessed Yes   Standardized Balance Assessment   Standardized Balance Assessment Berg Balance Test;Dynamic Gait Index;Timed Up and Go Test   Berg Balance Test   Sit to Stand Able to stand without using hands and stabilize independently   Standing Unsupported Able to stand 2 minutes with supervision   Sitting with Back Unsupported but Feet Supported on Floor or Stool Able to sit safely and securely 2 minutes   Stand to Sit Controls descent by using hands   Transfers Able to transfer safely, definite need of hands   Standing Unsupported with Eyes Closed Able to stand 10 seconds with supervision   Standing Ubsupported with Feet Together Needs help to attain position and unable to hold for 15 seconds   From Standing, Reach Forward with Outstretched Arm Reaches forward but needs supervision   From Standing Position, Pick up Object from Floor Unable to pick up and needs supervision   From Standing Position, Turn to Look Behind Over each Shoulder Needs assist to keep from losing balance and falling   Turn 360 Degrees Needs assistance while turning   Standing Unsupported, Alternately Place Feet on Step/Stool Needs assistance to keep from falling or unable to try   Standing Unsupported, One Foot in [employer] balance while stepping or standing   Standing on One Leg  Unable to try or needs assist to prevent fall   Total Score 22   Dynamic Gait Index   Level Surface Moderate Impairment   Change in Gait Speed Moderate Impairment   Gait with Horizontal Head Turns Moderate Impairment   Gait with Vertical Head Turns Moderate Impairment   Gait and Pivot Turn Severe Impairment   Step Over Obstacle Moderate Impairment   Step Around Obstacles Moderate Impairment   Steps Moderate Impairment   Total Score 7   Timed Up and Go Test   Normal TUG (seconds) 15.6                           PT Education - 11/29/15 1642    Education provided Yes   Education Details PT POC and plans for cardiac rehab   Person(s) Educated Patient;Spouse   Methods Explanation   Comprehension Verbalized understanding          PT Short Term Goals - 11/29/15 1718    PT SHORT TERM GOAL #1   Title Pt will be able to ambulate 300' around obstales with supervision (Target Date: 12/28/2015)   Time 4   Period Weeks   Status New   PT SHORT TERM GOAL #2   Title Patient ambulates with head turns to scan environment and is able to maintain path with supervision. (Target Date: 12/28/2015)   Time 4   Period Weeks   Status New   PT SHORT TERM GOAL #3   Title Patient demonstrates understanding of initial HEP. (Target Date: 12/28/2015)   Time 4   Period Weeks   Status New   PT SHORT TERM GOAL #4   Title Patient reaches 10" anteriorly & to floor without UE support safely. (Target Date: 12/28/2015)   Time 4   Period Weeks   Status New           PT Long Term Goals - 11/29/15 1710    PT LONG TERM GOAL #1   Title Pt will be  independent with HEP for strengthening and balance to maximaze functoinal gains  (Target Date: 01/25/2016)   Time 8   Period Weeks   Status New   PT LONG TERM GOAL #2   Title Pt will be able to ambulate >1000' including grass, ramps, curbs and stairs (1 rail) without device modifed independent to indicate community level mobilty.  (Target  Date: 01/25/2016)   Time 8   Period Weeks   Status New   PT LONG TERM GOAL #3   Title Pt will be able to improve Berg Balance Score from 22/56 to >36/56 to indicate improve balance (Target Date: 01/25/2016)   Time 8   Period Weeks   Status New   PT LONG TERM GOAL #4   Title Pt will be able to improve Dynamic Gait Index from 7/24 to >/= 14/30 to indicate decreased risk for falls (Target Date: 01/25/2016)   Time 8   Period Weeks   Status New   PT LONG TERM GOAL #5   Title Pt will be able to improve Timed Up-Go from 15.60s to < 13.5sec to indicate decreased risk for falls (Target Date: 01/25/2016)   Time 8   Period Weeks   Status New               Plan - December 11, 2015 1645    Clinical Impression Statement This patient is a 82 yr old male referred for evaluation following a MI in order to safely particpate in Cardiac Rehab. PMH: HTN, CVA, TIA, HLD, prostate CA, idiopathic PE, back sx in 2000. During the evaluation, the patient demonstrated decreased balance with Berg Balance Score 22/56, DGI 7/24 and Timed Up-Go : 15.60 seconds all indicating an increased risk for falls. 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 During evaluation patients BP and heart rate were taken at rest BP: 148/77, HR: 45 and SPO2: 94%, and after activity BP: 184/86, HR: 57 and SPO2: 98%. This evaluatoin was limited due to patient endurance and level of fatigue. Skilled PT is needed to address above impairments    Rehab Potential Good   Clinical Impairments Affecting Rehab Potential Patient comorbitiies with limitations from back pain   PT Frequency 2x / week   PT Duration 8 weeks   PT Treatment/Interventions ADLs/Self Care Home Management;Therapeutic exercise;Therapeutic activities;Functional mobility training;Stair training;Gait training;DME Instruction;Balance training;Neuromuscular re-education;Patient/family education;Orthotic Fit/Training;Passive  range of motion   PT Next Visit Plan Start [place] HEP initially and advance exercises & gait   Consulted and Agree with Plan of Care Patient;Family member/caregiver   Family Member Consulted wife      Patient will benefit from skilled therapeutic intervention in order to improve the following deficits and impairments:  Abnormal gait, Decreased activity tolerance, Cardiopulmonary status limiting activity, Decreased balance, Decreased strength, Postural dysfunction, Improper body mechanics, Decreased endurance, Decreased knowledge of use of DME, Difficulty walking  Visit Diagnosis: Other abnormalities of gait and mobility  Unsteadiness on feet  Difficulty in walking, not elsewhere classified  Midline low back pain without sciatica  History of falling      G-Codes - 2015/12/11 1316    Functional Assessment Tool Used Berg Balance 22/56   Functional Limitation Mobility: Walking and moving around   Mobility: Walking and Moving Around Current Status [phone]) At least 60 percent but less than 80 percent impaired, limited or restricted   Mobility: Walking and Moving Around Goal Status [id number]) At least 20 percent but less than 40  percent impaired, limited or restricted       Problem List Patient Active Problem List   Diagnosis Date Noted  . Atrial fibrillation ([place]) 11/21/2015  . STEMI (ST elevation myocardial infarction) ([place]) 11/19/2015  . Acute ST elevation myocardial infarction (STEMI) of anterolateral wall ([place])   . Pruritus 01/02/2015  . History of CVA (cerebrovascular accident) 05/31/2014  . Pulmonary embolism ([place]) 05/20/2014  . HTN (hypertension) 05/20/2014  . Unspecified late effects of cerebrovascular disease 05/20/2014  . Late effects of cerebrovascular disease   . Prostate cancer ([place]) 04/05/2014  . Spinal stenosis of lumbar region 11/15/2013  . Falls 05/10/2013  . Hyperlipidemia   . Benign essential tremor   . Supraventricular premature beats   . Lumbago   .  Palpitations   . Ingrown toenail without infection 09/07/2012   Dillard Essex, SPT 11/29/2015, 5:20 PM  Jamey Reas, PT, DPT PT Specializing in [place] 11/30/2015 1:17 PM Phone:  [phone]  Fax:  [phone] [facility] [address] [place], [place] [address]   [facility] [address] [place], [place], [address] Phone: [phone]   Fax:  [phone]  Name: Fritz Licea. MRN: [id number] Date of Birth: 1927-04-26

## 2015-12-03 ENCOUNTER — Encounter: Payer: Self-pay | Admitting: Physical Therapy

## 2015-12-03 ENCOUNTER — Telehealth: Payer: Self-pay | Admitting: Physician Assistant

## 2015-12-03 ENCOUNTER — Encounter: Payer: Self-pay | Admitting: Physician Assistant

## 2015-12-03 ENCOUNTER — Ambulatory Visit (INDEPENDENT_AMBULATORY_CARE_PROVIDER_SITE_OTHER): Payer: PPO | Admitting: Physician Assistant

## 2015-12-03 ENCOUNTER — Ambulatory Visit: Payer: PPO | Attending: Cardiovascular Disease | Admitting: Physical Therapy

## 2015-12-03 VITALS — BP 120/60 | HR 44 | Ht 71.0 in | Wt 150.4 lb

## 2015-12-03 DIAGNOSIS — R262 Difficulty in walking, not elsewhere classified: Secondary | ICD-10-CM | POA: Diagnosis not present

## 2015-12-03 DIAGNOSIS — M545 Low back pain: Secondary | ICD-10-CM | POA: Diagnosis not present

## 2015-12-03 DIAGNOSIS — I2510 Atherosclerotic heart disease of native coronary artery without angina pectoris: Secondary | ICD-10-CM | POA: Diagnosis not present

## 2015-12-03 DIAGNOSIS — I480 Paroxysmal atrial fibrillation: Secondary | ICD-10-CM

## 2015-12-03 DIAGNOSIS — Z9181 History of falling: Secondary | ICD-10-CM | POA: Diagnosis not present

## 2015-12-03 DIAGNOSIS — I2109 ST elevation (STEMI) myocardial infarction involving other coronary artery of anterior wall: Secondary | ICD-10-CM

## 2015-12-03 DIAGNOSIS — I255 Ischemic cardiomyopathy: Secondary | ICD-10-CM | POA: Diagnosis not present

## 2015-12-03 DIAGNOSIS — R2681 Unsteadiness on feet: Secondary | ICD-10-CM | POA: Insufficient documentation

## 2015-12-03 DIAGNOSIS — R2689 Other abnormalities of gait and mobility: Secondary | ICD-10-CM | POA: Insufficient documentation

## 2015-12-03 DIAGNOSIS — I10 Essential (primary) hypertension: Secondary | ICD-10-CM

## 2015-12-03 DIAGNOSIS — E785 Hyperlipidemia, unspecified: Secondary | ICD-10-CM

## 2015-12-03 MED ORDER — METOPROLOL SUCCINATE ER 25 MG PO TB24: 12.5000 mg | ORAL_TABLET | Freq: Every day | ORAL | Status: DC

## 2015-12-03 MED ORDER — APIXABAN 5 MG PO TABS: 5.0000 mg | ORAL_TABLET | Freq: Two times a day (BID) | ORAL | Status: AC

## 2015-12-03 MED ORDER — AMIODARONE HCL 200 MG PO TABS: 200.0000 mg | ORAL_TABLET | Freq: Every day | ORAL | Status: DC

## 2015-12-03 MED ORDER — ASPIRIN 81 MG PO TBEC: 81.0000 mg | DELAYED_RELEASE_TABLET | Freq: Every day | ORAL | Status: AC

## 2015-12-03 NOTE — Telephone Encounter (Signed)
New message     Patient wife calling wants to know when her husband can resume driving.

## 2015-12-03 NOTE — Patient Instructions (Addendum)
Medication Instructions:  1. STOP METOPROLOL TARTRATE  2. START TOPROL XL 25 MG TABLET WITH THE DIRECTIONS ON THE BOTTLE TO STATE TAKE 1/2 TABLET DAILY = 12.5 MG DAILY 3. YOUR LAST DOSE OF ASPIRIN WILL BE 12/19/15 4. ON 12/20/15 YOU WILL START ELIQUIS 5 MG TWICE DAILY; AN RX HAS BEEN SENT 5. DECREASE AMIODARONE TO 200 MG DAILY Labwork: 1. TODAY BMET 2. IN 6 WEEKS YOU WILL NEED A FASTING LIPID AND LIVER FUNCTION Testing/Procedures: Your physician has requested that you have an echocardiogram. Echocardiography is a painless test that uses sound waves to create images of your heart. It provides your doctor with information about the size and shape of your heart and how well your heart's chambers and valves are working. This procedure takes approximately one hour. There are no restrictions for this procedure. THIS WILL NEED TO BE DONE ON OR AFTER 02/19/16 Follow-Up: [facility]ER,PAC 2-3 WEEKS SAME DAY IF POSSIBLE WITH DR. Angelena Form IS IN THE OFFICE Any Other Special Instructions Will Be Listed Below (If Applicable). YOU ARE BEING REFERRED TO A NUTRITIONIST   If you need a refill on your cardiac medications before your next appointment, please call your pharmacy.

## 2015-12-03 NOTE — Progress Notes (Signed)
Cardiology Office Note:    Date:  12/03/2015   ID:  Henry Cotta., DOB 1926/07/17, MRN [id number]  PCP:  [name], MD  Cardiologist:  Dr. [name]   Electrophysiologist:  n/a Pulmonology: Dr. [name]  Referring MD: Henry Dooms, MD   Chief Complaint  Patient presents with  . Hospitalization Follow-up    s/p MI; This is a Transitional Care Management appointment    History of Present Illness:     Henry Scroggin. is a 82 y.o. male with a hx of HTN, HL, prior stroke/TIA, prostate CA and idiopathic pulmonary embolism. Patient was previously treated with Eliquis. This was stopped 11/17/15 by Pulmonology.    Admitted 6/19-6/22 with an anterolateral STEMI.  He presented with anterior chest discomfort after awakening. He had evidence of an anterolateral STEMI upon presentation and was taken directly to the cardiac catheterization lab. This demonstrated an occluded superior subbranch of the second diagonal, moderate mid LAD stenosis and moderate stenosis in the inferior subbranch of the second diagonal. He underwent PCI of the superior subbranch of D2 with placement of a DES. Procedure was complicated by SVT. He was placed on aspirin, Plavix, beta blocker, statin. He did have borderline blood pressure and bradycardia requiring reduced dose of beta blocker. Hospitalization was complicated by development of paroxysmal atrial fibrillation. CHADS2-VASc=7.  Patient was placed on amiodarone and lisinopril. It was felt that triple Rx with ASA, Plavix, Eliquis would be too risky given his advanced age.  Plan was to continue aspirin and Plavix for a total of 30 days. Then, aspirin would be stopped and Eliquis would be added back to his medical regimen. Prior to discharge, telemetry demonstrated no further episodes of atrial fibrillation.  Echo demonstrated reduced EF at 30-35%. He was referred for OP PT due to deconditioning.    He returns for follow-up. He is here today with his wife.   Since DC from the hospital, he is doing well. He is currently seeing physical therapy. Plan is to continue for 4-8 weeks. He has been contacted by cardiac rehabilitation. They have put him down for orientation in August. He denies recurrent chest discomfort. Denies significant dyspnea. Denies orthopnea, PND. He has chronic LE edema without significant change. He has chronic nonproductive cough. This has been present for quite some time without significant change. Denies any weight changes.   Past Medical History  Diagnosis Date  . HTN (hypertension)   . History of stroke   . History of TIA (transient ischemic attack)   . Hyperlipidemia   . Malignant neoplasm of prostate ([employer])   . Essential and other specified forms of tremor   . Supraventricular premature beats   . PAF (paroxysmal atrial fibrillation) ([place])   . External hemorrhoids without mention of complication   . Unspecified anomaly of tooth position   . Sebaceous cyst   . Lumbago   . Nontraumatic rupture of tendons of biceps (long head)   . History of ST elevation myocardial infarction (STEMI)     a. Ant-Lat STEMI >> DES to D2 subbranch  . CAD (coronary artery disease)     a. STEMI 6/17: LHC - oLAD 10, mLAD 50, dLAD 20, D2 inf subbranch 70 + sup subbranch 100, pRCA 20, oRPDA 40, EF 35-45% >> PCI:  2.25 x 12 mm resolute integrity DES to the superior subbranch of D2   . Paroxysmal atrial fibrillation (HCC)     during admit for STEMI 6/17 >> triple Rx high  risk >> ASA + Plavix x 30 days post MI, then Eliquis + Plavix  . Ischemic cardiomyopathy     a. Echo 6/17: Mid and distal anterior/inferior, septal and apical HK, mild concentric LVH, EF 30-35%, mild LAE    Past Surgical History  Procedure Laterality Date  . Prostate cancer    . Back surgery      with sciatica  . Appendectomy  1942  . Spine surgery N/A 2000    [place]  . Cardiac catheterization N/A 11/19/2015    Procedure: Left Heart Cath and Coronary Angiography;  Surgeon:  Burnell Blanks, MD;  Location: [facility];  Service: Cardiovascular;  Laterality: N/A;  . Cardiac catheterization N/A 11/19/2015    Procedure: Coronary Stent Intervention;  Surgeon: Burnell Blanks, MD;  Location: [facility];  Service: Cardiovascular;  Laterality: N/A;    Current Medications: Outpatient Prescriptions Prior to Visit  Medication Sig Dispense Refill  . atorvastatin (LIPITOR) 80 MG tablet Take 1 tablet (80 mg total) by mouth daily at 6 PM. 30 tablet 11  . Cholecalciferol (VITAMIN D-3) 1000 UNITS CAPS Take 1 capsule by mouth daily. Take one tablet by mouth once daily.    . clopidogrel (PLAVIX) 75 MG tablet Take 1 tablet (75 mg total) by mouth daily with breakfast. 30 tablet 11  . lisinopril (PRINIVIL,ZESTRIL) 2.5 MG tablet Take 1 tablet (2.5 mg total) by mouth daily. 30 tablet 11  . Multiple Vitamin (MULTIVITAMIN) tablet Take 1 tablet by mouth daily.    . nitroGLYCERIN (NITROSTAT) 0.4 MG SL tablet Place 1 tablet (0.4 mg total) under the tongue every 5 (five) minutes x 3 doses as needed for chest pain. 25 tablet 3  . amiodarone (PACERONE) 200 MG tablet Take 1 tablet (200 mg total) by mouth 2 (two) times daily. 60 tablet 6  . aspirin EC 81 MG EC tablet Take 1 tablet (81 mg total) by mouth daily.    . metoprolol tartrate (LOPRESSOR) 25 MG tablet Take 0.5 tablets (12.5 mg total) by mouth 2 (two) times daily. 60 tablet 11   No facility-administered medications prior to visit.      Allergies:   Review of patient's allergies indicates no known allergies.   Social History   Social History  . Marital Status: Married    Spouse Name: N/A  . Number of Children: N/A  . Years of Education: N/A   Occupational History  . retired    Social History Main Topics  . Smoking status: Never Smoker   . Smokeless tobacco: Never Used  . Alcohol Use: No  . Drug Use: No  . Sexual Activity: Not Asked   Other Topics Concern  . None   Social History Narrative       Family History:  The patient's family history includes Cancer (age of onset: 6) in his father.   ROS:   Please see the history of present illness.    Review of Systems  Cardiovascular: Positive for chest pain and leg swelling.  Respiratory: Positive for cough.   Musculoskeletal: Positive for back pain and joint pain.  Neurological: Positive for loss of balance.   All other systems reviewed and are negative.   Physical Exam:    VS:  BP 120/60 mmHg  Pulse 44  Ht 5\' 11"  (1.803 m)  Wt 150 lb 6.4 oz (68.221 kg)  BMI 20.99 kg/m2   Physical Exam  Constitutional: He is oriented to person, place, and time. He appears well-developed and  well-nourished.  HENT:  Head: Normocephalic and atraumatic.  Neck: Normal range of motion. No JVD present.  Cardiovascular: Normal rate, regular rhythm, S1 normal and S2 normal.  Exam reveals no gallop and no friction rub.   No murmur heard. Pulmonary/Chest: Effort normal and breath sounds normal. He has no wheezes. He has no rhonchi. He has no rales.  Abdominal: Soft. He exhibits no distension and no mass. There is no tenderness.  Musculoskeletal: Normal range of motion.  right wrist without hematoma or mass  Trace bilateral LE edema  Neurological: He is alert and oriented to person, place, and time.  Skin: Skin is warm and dry.  Psychiatric: He has a normal mood and affect.    Wt Readings from Last 3 Encounters:  12/03/15 150 lb 6.4 oz (68.221 kg)  11/22/15 148 lb 1.6 oz (67.178 kg)  10/15/15 150 lb 6.4 oz (68.221 kg)      Studies/Labs Reviewed:     EKG:  EKG is  ordered today.  The ekg ordered today demonstrates sinus bradycardia, HR 45, rightward axis, septal Q waves, QTc 423 ms  Recent Labs: 11/19/2015: ALT 25 11/20/2015: Magnesium 1.8 11/22/2015: BUN 19; Creatinine, Ser 1.09; Hemoglobin 13.6; Platelets 153; Potassium 4.3; Sodium 140; TSH 2.934   Recent Lipid Panel    Component Value Date/Time   CHOL 113 11/20/2015 0150   CHOL 174  07/03/2014 0820   TRIG 107 11/20/2015 0150   HDL 36* 11/20/2015 0150   HDL 52 07/03/2014 0820   CHOLHDL 3.1 11/20/2015 0150   CHOLHDL 3.3 07/03/2014 0820   VLDL 21 11/20/2015 0150   LDLCALC 56 11/20/2015 0150   LDLCALC 100* 07/03/2014 0820    Additional studies/ records that were reviewed today include:   Echo 11/21/15 Mid and distal anterior/inferior, septal and apical HK, mild concentric LVH, EF 30-35%, mild LAE  LHC 11/19/15 LAD ostial 10%, mid 50%, distal 20%, D2 inferior subbranch 70 and superior subbranch 100% LCx okay RCA proximal 20%, ostial RPDA 40% EF 35-45% with apical and anterior HK PCI: 2.25 x 12 mm resolute integrity DES to the superior subbranch of D2 1. Acute anterolateral STEMI 2. Occluded superior sub-branch of the moderate caliber Diagonal branch.  3. Successful PTCA/DES x 1 Diagonal 1 4. Moderate stenosis mid LAD (appears to be non flow limiting) 5. Mild non-obstructive disease RCA 6. Mild to moderate segmental LV systolic dysfunction 7. SVT during intervention        Myoview 7/05 Normal  ASSESSMENT:     1. Acute ST elevation myocardial infarction (STEMI) of anterolateral wall (HCC)   2. Coronary artery disease involving native coronary artery of native heart without angina pectoris   3. Ischemic cardiomyopathy   4. Paroxysmal atrial fibrillation (HCC)   5. Essential hypertension   6. Hyperlipidemia     PLAN:     In order of problems listed above:  1. S/p Anterolateral STEMI - He was treated with DES to the superior subbranch of D2. As noted, he did have post PCI atrial fibrillation. His stroke risk is significant. However, given his advanced age, bleeding risk was felt to be too great to embark upon triple therapy. He will remain on dual antiplatelet therapy for a total of 30 days. He will then remain on Plavix and add Eliquis back to his medical regimen. He is currently undergoing physical therapy for deconditioning and worsening balance. Once he  completes PT, he will need to enroll in cardiac rehabilitation. His wife is uncertain what to do  with his diet. He is limited in what he is willing to eat.  -  Continue aspirin, Plavix until 12/19/15. Then DC aspirin.  -  Refer to nutrition  -  Plan cardiac rehabilitation after outpatient physical therapy complete  -  Continue statin, beta blocker, ACE inhibitor.  2. CAD - Residual CAD will be treated medically. Continue aspirin, Plavix, statin, beta blocker, ACE inhibitor. Plan cardiac rehabilitation once finished with outpatient physical therapy as noted above.  3. Ischemic cardiomyopathy - Continue beta blocker, ACE inhibitor. Plan repeat echocardiogram 90 days post myocardial infarction.  4. PAF - Maintaining NSR.  He is quite bradycardic. He notes low heart rates throughout his life as he was a previous athlete. He is currently not symptomatic.  -  DC metoprolol tartrate  -  Start Toprol-XL 12.5 mg daily  -  Decrease amiodarone to 200 mg daily  -  Start Eliquis 5 mg twice a day on 12/20/15  -  Follow-up 2-3 weeks.  If HR remains in 40s, may need to DC beta blocker.  5. HTN - Blood pressure control.  6. Hyperlipidemia - Continue high-dose statin given recent MI. Check lipids and LFTs in 6 weeks.   Medication Adjustments/Labs and Tests Ordered: Current medicines are reviewed at length with the patient today.  Concerns regarding medicines are outlined above.  Medication changes, Labs and Tests ordered today are outlined in the Patient Instructions noted below. Patient Instructions  Medication Instructions:  1. STOP METOPROLOL TARTRATE  2. START TOPROL XL 25 MG TABLET WITH THE DIRECTIONS ON THE BOTTLE TO STATE TAKE 1/2 TABLET DAILY = 12.5 MG DAILY 3. YOUR LAST DOSE OF ASPIRIN WILL BE 12/19/15 4. ON 12/20/15 YOU WILL START ELIQUIS 5 MG TWICE DAILY; AN RX HAS BEEN SENT 5. DECREASE AMIODARONE TO 200 MG DAILY Labwork: 1. TODAY BMET 2. IN 6 WEEKS YOU WILL NEED A FASTING LIPID AND LIVER  FUNCTION Testing/Procedures: Your physician has requested that you have an echocardiogram. Echocardiography is a painless test that uses sound waves to create images of your heart. It provides your doctor with information about the size and shape of your heart and how well your heart's chambers and valves are working. This procedure takes approximately one hour. There are no restrictions for this procedure. THIS WILL NEED TO BE DONE ON OR AFTER 02/19/16 Follow-Up: Henry Franklin,PAC 2-3 WEEKS SAME DAY IF POSSIBLE WITH DR. Angelena Form IS IN THE OFFICE Any Other Special Instructions Will Be Listed Below (If Applicable). YOU ARE BEING REFERRED TO A NUTRITIONIST   If you need a refill on your cardiac medications before your next appointment, please call your pharmacy.   Signed, Henry Dopp, PA-C  12/03/2015 1:50 PM    [facility] [place], [place], [place]  [address] Phone: [phone]; Fax: [phone]

## 2015-12-03 NOTE — Telephone Encounter (Signed)
Lmtcb in regards to when pt can resume driving. I discussed this w/Scott W. PA who advised no driving until his next f/u appt 8/3 10:15. Pt is s/p MI.

## 2015-12-03 NOTE — Telephone Encounter (Signed)
Wife cb and has been notified per the recommendations from [facility] pt advised no driving until he is followed up 01/03/16 in our office and has been re-evaluated. Wife verbalzed understanding to this.

## 2015-12-04 LAB — BASIC METABOLIC PANEL
BUN: 19 mg/dL (ref 7–25)
CALCIUM: 9.3 mg/dL (ref 8.6–10.3)
CO2: 25 mmol/L (ref 20–31)
CREATININE: 1.31 mg/dL — AB (ref 0.70–1.11)
Chloride: 106 mmol/L (ref 98–110)
GLUCOSE: 95 mg/dL (ref 65–99)
Potassium: 5.5 mmol/L — ABNORMAL HIGH (ref 3.5–5.3)
Sodium: 141 mmol/L (ref 135–146)

## 2015-12-04 NOTE — Therapy (Signed)
[facility] [address] [place], [place], [address] Phone: [phone]   Fax:  [phone]  Physical Therapy Treatment  Patient Details  Name: Henry Franklin. MRN: [id number] Date of Birth: 1926/12/29 Referring Provider: [name] MD  Encounter Date: 12/03/2015      PT End of Session - 12/03/15 1700    Visit Number 2   Number of Visits 17   Date for PT Re-Evaluation 01/25/16   Authorization Type Medicare- G codes needed   PT Start Time [id number]   PT Stop Time 1538   PT Time Calculation (min) 44 min   Equipment Utilized During Treatment Gait belt   Activity Tolerance Patient tolerated treatment well   Behavior During Therapy WFL for tasks assessed/performed      Past Medical History  Diagnosis Date  . HTN (hypertension)   . History of stroke   . History of TIA (transient ischemic attack)   . Hyperlipidemia   . Malignant neoplasm of prostate ([facility])   . Essential and other specified forms of tremor   . Supraventricular premature beats   . PAF (paroxysmal atrial fibrillation) ([place] [facility])   . External hemorrhoids without mention of complication   . Unspecified anomaly of tooth position   . Sebaceous cyst   . Lumbago   . Nontraumatic rupture of tendons of biceps (long head)   . History of ST elevation myocardial infarction (STEMI)     a. Ant-Lat STEMI >> DES to D2 subbranch  . CAD (coronary artery disease)     a. STEMI 6/17: LHC - oLAD 10, mLAD 50, dLAD 20, D2 inf subbranch 70 + sup subbranch 100, pRCA 20, oRPDA 40, EF 35-45% >> PCI:  2.25 x 12 mm resolute integrity DES to the superior subbranch of D2   . Paroxysmal atrial fibrillation (HCC)     during admit for STEMI 6/17 >> triple Rx high risk >> ASA + Plavix x 30 days post MI, then Eliquis + Plavix  . Ischemic cardiomyopathy     a. Echo 6/17: Mid and distal anterior/inferior, septal and apical HK, mild concentric LVH, EF 30-35%, mild LAE    Past Surgical  History  Procedure Laterality Date  . Prostate cancer    . Back surgery      with sciatica  . Appendectomy  1942  . Spine surgery N/A 2000    [facility]  . Cardiac catheterization N/A 11/19/2015    Procedure: Left Heart Cath and Coronary Angiography;  Surgeon: Burnell Blanks, MD;  Location: [facility];  Service: Cardiovascular;  Laterality: N/A;  . Cardiac catheterization N/A 11/19/2015    Procedure: Coronary Stent Intervention;  Surgeon: Burnell Blanks, MD;  Location: [facility];  Service: Cardiovascular;  Laterality: N/A;    There were no vitals filed for this visit.      Subjective Assessment - 12/03/15 1454    Subjective (p) No falls. He had lab work today.    Patient is accompained by: (p) Family member   Pertinent History (p) HTN, CVA, TIA, HLD, prostate CA, idiopathic PE, back sx in 2000   Limitations (p) Standing;Walking;Lifting   Patient Stated Goals (p) To improve balance and walking   Currently in Pain? (p) No/denies                              Balance Exercises - 12/03/15 [place]  Head Movements Sitting;5 reps   Neck Movements Sitting;5 reps   Back Extension 5 reps;Standing  back against counter   Trunk Movements 5 reps;Standing  back against counter   Ankle Movements 10 reps;Sitting   Knee Extensor 10 reps   Knee Flexor 10 reps  counter support   Hip ABductor 10 reps  counter support   Ankle Plantorflexors 20 reps, support  counter support   Ankle Dorsiflexors 20 reps, support  counter posterior & chair back anterior   Knee Bends 10 reps, support  chair posteriorly for safety & counter support   Backwards Walking Support  counter support   Walking and Turning Around Assistive device  counter support & chair back intermittent touches   Sideways Walking Assistive device  counter support   Tandem Stance 10 seconds, support  counter support   Tandem Walk Support  counter support   One Leg  Stand 10 seconds, support  modified with one forefoot in low cabinet   Heel Walking Support  counter support   Toe Walk Support  counter support   Sit to Stand 5 reps, one support  5 reps with LUE & 5 reps with RUE   Stair Walking 4 steps with L rail & 4 steps with R rail   Overall [facility]TAG[facility] Comments Oxygen saturation initially 96% & varied 96-98% with exercises; HR initially 39bpm & 44-50 bpm with exercises           PT Education - 12/03/15 1312    Education provided Yes   Education Details [employer]) Educated Patient   Methods Explanation;Demonstration;Tactile cues;Verbal cues;Handout   Comprehension Verbalized understanding;Returned demonstration;Verbal cues required;Tactile cues required;Need further instruction          PT Short Term Goals - 12/03/15 1700    PT SHORT TERM GOAL #1   Title Pt will be able to ambulate 300' around obstales with supervision (Target Date: 12/28/2015)   Time 4   Period Weeks   Status On-going   PT SHORT TERM GOAL #2   Title Patient ambulates with head turns to scan environment and is able to maintain path with supervision. (Target Date: 12/28/2015)   Time 4   Period Weeks   Status On-going   PT SHORT TERM GOAL #3   Title Patient demonstrates understanding of initial HEP. (Target Date: 12/28/2015)   Time 4   Period Weeks   Status On-going   PT SHORT TERM GOAL #4   Title Patient reaches 10" anteriorly & to floor without UE support safely. (Target Date: 12/28/2015)   Time 4   Period Weeks   Status On-going           PT Long Term Goals - 12/03/15 1700    PT LONG TERM GOAL #1   Title Pt will be independent with HEP for strengthening and balance to maximaze functoinal gains  (Target Date: 01/25/2016)   Time 8   Period Weeks   Status On-going   PT LONG TERM GOAL #2   Title Pt will be able to ambulate >1000' including grass, ramps, curbs and stairs (1 rail) without device modifed independent to indicate community level  mobilty.  (Target Date: 01/25/2016)   Time 8   Period Weeks   Status On-going   PT LONG TERM GOAL #3   Title Pt will be able to improve Berg Balance Score from 22/56 to >36/56 to indicate improve balance (Target Date: 01/25/2016)   Time 8   Period Weeks   Status  On-going   PT LONG TERM GOAL #4   Title Pt will be able to improve Dynamic Gait Index from 7/24 to >/= 14/30 to indicate decreased risk for falls (Target Date: 01/25/2016)   Time 8   Period Weeks   Status On-going   PT LONG TERM GOAL #5   Title Pt will be able to improve Timed Up-Go from 15.60s to < 13.5sec to indicate decreased risk for falls (Target Date: 01/25/2016)   Time 8   Period Weeks   Status On-going               Plan - 12/03/15 1700    Clinical Impression Statement Patient appears to have general understanding of initial HEP of [place] program.    Rehab Potential Good   Clinical Impairments Affecting Rehab Potential Patient comorbitiies with limitations from back pain   PT Frequency 2x / week   PT Duration 8 weeks   PT Treatment/Interventions ADLs/Self Care Home Management;Therapeutic exercise;Therapeutic activities;Functional mobility training;Stair training;Gait training;DME Instruction;Balance training;Neuromuscular re-education;Patient/family education;Orthotic Fit/Training;Passive range of motion   PT Next Visit Plan Check [place] HEP and advance exercises & gait   Consulted and Agree with Plan of Care Patient;Family member/caregiver   Family Member Consulted wife      Patient will benefit from skilled therapeutic intervention in order to improve the following deficits and impairments:  Abnormal gait, Decreased activity tolerance, Cardiopulmonary status limiting activity, Decreased balance, Decreased strength, Postural dysfunction, Improper body mechanics, Decreased endurance, Decreased knowledge of use of DME, Difficulty walking  Visit Diagnosis: Other abnormalities of gait and  mobility  Unsteadiness on feet  Difficulty in walking, not elsewhere classified     Problem List Patient Active Problem List   Diagnosis Date Noted  . CAD (coronary artery disease) 12/03/2015  . Atrial fibrillation ([place]) 11/21/2015  . STEMI (ST elevation myocardial infarction) ([place]) 11/19/2015  . Acute ST elevation myocardial infarction (STEMI) of anterolateral wall ([facility])   . Pruritus 01/02/2015  . History of CVA (cerebrovascular accident) 05/31/2014  . Pulmonary embolism ([place]) 05/20/2014  . HTN (hypertension) 05/20/2014  . Unspecified late effects of cerebrovascular disease 05/20/2014  . Late effects of cerebrovascular disease   . Prostate cancer ([place]) 04/05/2014  . Spinal stenosis of lumbar region 11/15/2013  . Falls 05/10/2013  . Hyperlipidemia   . Benign essential tremor   . Supraventricular premature beats   . Lumbago   . Palpitations   . Ingrown toenail without infection 09/07/2012    Isaish Alemu PT, DPT 12/04/2015, 1:13 PM  [facility] [address] [place], [place], [address] Phone: [phone]   Fax:  [phone]  Name: Lori Donkor. MRN: [id number] Date of Birth: May 16, 1927

## 2015-12-05 ENCOUNTER — Other Ambulatory Visit (INDEPENDENT_AMBULATORY_CARE_PROVIDER_SITE_OTHER): Payer: PPO | Admitting: *Deleted

## 2015-12-05 ENCOUNTER — Telehealth: Payer: Self-pay | Admitting: *Deleted

## 2015-12-05 DIAGNOSIS — I10 Essential (primary) hypertension: Secondary | ICD-10-CM

## 2015-12-05 DIAGNOSIS — E785 Hyperlipidemia, unspecified: Secondary | ICD-10-CM

## 2015-12-05 DIAGNOSIS — I2510 Atherosclerotic heart disease of native coronary artery without angina pectoris: Secondary | ICD-10-CM

## 2015-12-05 DIAGNOSIS — I2109 ST elevation (STEMI) myocardial infarction involving other coronary artery of anterior wall: Secondary | ICD-10-CM

## 2015-12-05 LAB — BASIC METABOLIC PANEL
BUN: 20 mg/dL (ref 7–25)
CHLORIDE: 106 mmol/L (ref 98–110)
CO2: 28 mmol/L (ref 20–31)
Calcium: 9.4 mg/dL (ref 8.6–10.3)
Creat: 1.3 mg/dL — ABNORMAL HIGH (ref 0.70–1.11)
Glucose, Bld: 94 mg/dL (ref 65–99)
POTASSIUM: 5 mmol/L (ref 3.5–5.3)
SODIUM: 141 mmol/L (ref 135–146)

## 2015-12-05 LAB — LIPID PANEL
Cholesterol: 91 mg/dL — ABNORMAL LOW (ref 125–200)
HDL: 46 mg/dL (ref >=40)
LDL CALC: 36 mg/dL (ref <130)
TRIGLYCERIDES: 47 mg/dL (ref <150)
Total CHOL/HDL Ratio: 2 Ratio (ref <=5.0)
VLDL: 9 mg/dL (ref <30)

## 2015-12-05 LAB — HEPATIC FUNCTION PANEL
ALBUMIN: 3.7 g/dL (ref 3.6–5.1)
ALT: 30 U/L (ref 9–46)
AST: 20 U/L (ref 10–35)
Alkaline Phosphatase: 69 U/L (ref 40–115)
BILIRUBIN TOTAL: 0.8 mg/dL (ref 0.2–1.2)
Bilirubin, Direct: 0.2 mg/dL (ref <=0.2)
Indirect Bilirubin: 0.6 mg/dL (ref 0.2–1.2)
TOTAL PROTEIN: 6 g/dL — AB (ref 6.1–8.1)

## 2015-12-05 NOTE — Telephone Encounter (Signed)
Pt notified of lab results for repeat bmet, K+ is normal. I did state to pt that our lab tech drew his cholesterol panel today and he was not supposed to have this done until 01/14/16, appt is in the computer. I advised pt I will have billing credit the lipid and liver panel that was done in error today.

## 2015-12-05 NOTE — Telephone Encounter (Signed)
DPR for wife who has been notified BMET hemolyzed, need to repeat bmet this week. Wife said she will have pt come in tomorrow 7/6 for bmet.

## 2015-12-05 NOTE — Telephone Encounter (Signed)
Lmtcb to go over lab results. BMET hemolyzed, need to repeat bmet this week per Brynda Rim. PA..

## 2015-12-06 ENCOUNTER — Ambulatory Visit (INDEPENDENT_AMBULATORY_CARE_PROVIDER_SITE_OTHER): Payer: PPO | Admitting: Pulmonary Disease

## 2015-12-06 ENCOUNTER — Other Ambulatory Visit: Payer: PPO

## 2015-12-06 ENCOUNTER — Ambulatory Visit: Payer: PPO | Admitting: Physical Therapy

## 2015-12-06 ENCOUNTER — Encounter: Payer: Self-pay | Admitting: Pulmonary Disease

## 2015-12-06 ENCOUNTER — Encounter: Payer: Self-pay | Admitting: Physical Therapy

## 2015-12-06 VITALS — BP 122/60 | HR 42 | Ht 71.0 in | Wt 147.6 lb

## 2015-12-06 DIAGNOSIS — R2681 Unsteadiness on feet: Secondary | ICD-10-CM

## 2015-12-06 DIAGNOSIS — R2689 Other abnormalities of gait and mobility: Secondary | ICD-10-CM | POA: Diagnosis not present

## 2015-12-06 DIAGNOSIS — I2699 Other pulmonary embolism without acute cor pulmonale: Secondary | ICD-10-CM

## 2015-12-06 DIAGNOSIS — M545 Low back pain, unspecified: Secondary | ICD-10-CM

## 2015-12-06 DIAGNOSIS — Z9181 History of falling: Secondary | ICD-10-CM

## 2015-12-06 DIAGNOSIS — R262 Difficulty in walking, not elsewhere classified: Secondary | ICD-10-CM

## 2015-12-06 NOTE — Patient Instructions (Signed)
Follow-up with cardiology and follow their recommendations for blood thinner medications Follow-up with [place] if you develop another pulmonary problem.

## 2015-12-06 NOTE — Progress Notes (Signed)
Subjective:    Patient ID: Henry Cotta., male    DOB: Oct 16, 1926, 82 y.o.   MRN: [id number]  Synopsis: Henry Franklin had an idiopathic pulmonary embolism in 2016.  He had an MI in 11/2015.    HPI Chief Complaint  Patient presents with  . Follow-up    pt states he's doing well with his breathing- states he had a heart attack on 11/19/15.   Henry Franklin had a heart attack since we saw him last.  He had this on 6/19 and ended up having a PCI placed.  He had atrial fibrillation while hospitalized.    He has been walking with a physical therapist and has been participating in "fall treatment" with a physical therapist at the [facility] outpatient physical therapist.    He has been told that he should restart Eliqius on 12/20/2015.    Past Medical History  Diagnosis Date  . HTN (hypertension)   . History of stroke   . History of TIA (transient ischemic attack)   . Hyperlipidemia   . Malignant neoplasm of prostate ([place])   . Essential and other specified forms of tremor   . Supraventricular premature beats   . PAF (paroxysmal atrial fibrillation) ([place])   . External hemorrhoids without mention of complication   . Unspecified anomaly of tooth position   . Sebaceous cyst   . Lumbago   . Nontraumatic rupture of tendons of biceps (long head)   . History of ST elevation myocardial infarction (STEMI)     a. Ant-Lat STEMI >> DES to D2 subbranch  . CAD (coronary artery disease)     a. STEMI 6/17: LHC - oLAD 10, mLAD 50, dLAD 20, D2 inf subbranch 70 + sup subbranch 100, pRCA 20, oRPDA 40, EF 35-45% >> PCI:  2.25 x 12 mm resolute integrity DES to the superior subbranch of D2   . Paroxysmal atrial fibrillation (HCC)     during admit for STEMI 6/17 >> triple Rx high risk >> ASA + Plavix x 30 days post MI, then Eliquis + Plavix  . Ischemic cardiomyopathy     a. Echo 6/17: Mid and distal anterior/inferior, septal and apical HK, mild concentric LVH, EF 30-35%, mild LAE      Review of Systems     Objective:     Physical Exam  Filed Vitals:   12/06/15 1419  BP: 122/60  Pulse: 42  Height: 5\' 11"  (1.803 m)  Weight: 147 lb 9.6 oz (66.951 kg)  SpO2: 97%   RA  Gen: well appearing HENT: OP clear, TM's clear, neck supple PULM: CTA B, normal percussion CV: RRR, no mgr, trace edema GI: BS+, soft, nontender Derm: no cyanosis or rash Psyche: normal mood and affect       Assessment & Plan:  Pulmonary embolism [facility]) Henry Franklin has been instructed to restart Eliquis for risk mitigation in the setting of atrial fibrillation.  Plan: He will resume Eliquis on July 20 per cardiology recommendations If cardiology feels that Eliquis is no longer indicated for risk reduction or is too risky considering his known recurrent falls, then I would support stopping this medication.  Follow-up with [place] on an as-needed basis     Current outpatient prescriptions:  .  amiodarone (PACERONE) 200 MG tablet, Take 1 tablet (200 mg total) by mouth daily., Disp: 90 tablet, Rfl: 3 .  aspirin 81 MG EC tablet, Take 1 tablet (81 mg total) by mouth daily., Disp: , Rfl:  .  atorvastatin (LIPITOR)  80 MG tablet, Take 1 tablet (80 mg total) by mouth daily at 6 PM., Disp: 30 tablet, Rfl: 11 .  Cholecalciferol (VITAMIN D-3) 1000 UNITS CAPS, Take 1 capsule by mouth daily. Take one tablet by mouth once daily., Disp: , Rfl:  .  clopidogrel (PLAVIX) 75 MG tablet, Take 1 tablet (75 mg total) by mouth daily with breakfast., Disp: 30 tablet, Rfl: 11 .  lisinopril (PRINIVIL,ZESTRIL) 2.5 MG tablet, Take 1 tablet (2.5 mg total) by mouth daily., Disp: 30 tablet, Rfl: 11 .  metoprolol succinate (TOPROL-XL) 25 MG 24 hr tablet, Take 0.5 tablets (12.5 mg total) by mouth daily., Disp: 90 tablet, Rfl: 3 .  Multiple Vitamin (MULTIVITAMIN) tablet, Take 1 tablet by mouth daily., Disp: , Rfl:  .  nitroGLYCERIN (NITROSTAT) 0.4 MG SL tablet, Place 1 tablet (0.4 mg total) under the tongue every 5 (five) minutes x 3 doses as needed for chest pain.,  Disp: 25 tablet, Rfl: 3 .  [START ON 12/20/2015] apixaban (ELIQUIS) 5 MG TABS tablet, Take 1 tablet (5 mg total) by mouth 2 (two) times daily. (Patient not taking: Reported on 12/06/2015), Disp: 180 tablet, Rfl: 3

## 2015-12-06 NOTE — Therapy (Signed)
[facility] [address] [place], [place], [address] Phone: [phone]   Fax:  [phone]  Physical Therapy Treatment  Patient Details  Name: Henry Franklin. MRN: [id number] Date of Birth: 04/10/1927 Referring Provider: [name] MD  Encounter Date: 12/06/2015      PT End of Session - 12/06/15 1017    Visit Number 3   Number of Visits 17   Date for PT Re-Evaluation 01/25/16   Authorization Type Medicare- G codes needed   PT Start Time 0934   PT Stop Time 1016   PT Time Calculation (min) 42 min   Equipment Utilized During Treatment Gait belt   Activity Tolerance Patient tolerated treatment well   Behavior During Therapy [facility] for tasks assessed/performed      Past Medical History  Diagnosis Date  . HTN (hypertension)   . History of stroke   . History of TIA (transient ischemic attack)   . Hyperlipidemia   . Malignant neoplasm of prostate ([place])   . Essential and other specified forms of tremor   . Supraventricular premature beats   . PAF (paroxysmal atrial fibrillation) ([place])   . External hemorrhoids without mention of complication   . Unspecified anomaly of tooth position   . Sebaceous cyst   . Lumbago   . Nontraumatic rupture of tendons of biceps (long head)   . History of ST elevation myocardial infarction (STEMI)     a. Ant-Lat STEMI >> DES to D2 subbranch  . CAD (coronary artery disease)     a. STEMI 6/17: LHC - oLAD 10, mLAD 50, dLAD 20, D2 inf subbranch 70 + sup subbranch 100, pRCA 20, oRPDA 40, EF 35-45% >> PCI:  2.25 x 12 mm resolute integrity DES to the superior subbranch of D2   . Paroxysmal atrial fibrillation (HCC)     during admit for STEMI 6/17 >> triple Rx high risk >> ASA + Plavix x 30 days post MI, then Eliquis + Plavix  . Ischemic cardiomyopathy     a. Echo 6/17: Mid and distal anterior/inferior, septal and apical HK, mild concentric LVH, EF 30-35%, mild LAE    Past Surgical  History  Procedure Laterality Date  . Prostate cancer    . Back surgery      with sciatica  . Appendectomy  September 10, 1940  . Spine surgery N/A September 11, 1998    [place]  . Cardiac catheterization N/A 11/19/2015    Procedure: Left Heart Cath and Coronary Angiography;  Surgeon: Burnell Blanks, MD;  Location: [place] CV [facility];  Service: Cardiovascular;  Laterality: N/A;  . Cardiac catheterization N/A 11/19/2015    Procedure: Coronary Stent Intervention;  Surgeon: Burnell Blanks, MD;  Location: [place] CV [facility];  Service: Cardiovascular;  Laterality: N/A;    There were no vitals filed for this visit.      Subjective Assessment - 12/06/15 0937    Subjective No falls. Performed HEP at home and has a few questions.   Patient is accompained by: Family member   Pertinent History HTN, CVA, TIA, HLD, prostate CA, idiopathic PE, back sx in 1998-09-11   Limitations Standing;Walking;Lifting   Patient Stated Goals To improve balance and walking   Currently in Pain? No/denies                              Balance Exercises - 12/06/15 0940    OTAGO PROGRAM   Knee Bends  10 reps, support   Backwards Walking Support   Walking and Turning Around Building services engineer Walking Assistive device   Tandem Stance 10 seconds, support   Tandem Walk Support   One Leg Stand 10 seconds, support   Heel Walking Support   Toe Walk Support   Sit to Stand 5 reps, one support   Stair Walking 4 steps with L rail & 4 steps with R rail   Overall OTAGO Comments SP02 28% and HR 46bpm checked towards end of HEP performance           PT Education - 12/06/15 0955    Education provided Yes   Education Details Reviewed [place]: cues for posture and for technique.   Person(s) Educated Patient   Methods Explanation;Demonstration;Handout;Verbal cues   Comprehension Verbalized understanding;Returned demonstration;Need further instruction          PT Short Term Goals - 12/03/15 1700    PT  SHORT TERM GOAL #1   Title Pt will be able to ambulate 300' around obstales with supervision (Target Date: 12/28/2015)   Time 4   Period Weeks   Status On-going   PT SHORT TERM GOAL #2   Title Patient ambulates with head turns to scan environment and is able to maintain path with supervision. (Target Date: 12/28/2015)   Time 4   Period Weeks   Status On-going   PT SHORT TERM GOAL #3   Title Patient demonstrates understanding of initial HEP. (Target Date: 12/28/2015)   Time 4   Period Weeks   Status On-going   PT SHORT TERM GOAL #4   Title Patient reaches 10" anteriorly & to floor without UE support safely. (Target Date: 12/28/2015)   Time 4   Period Weeks   Status On-going           PT Long Term Goals - 12/03/15 1700    PT LONG TERM GOAL #1   Title Pt will be independent with HEP for strengthening and balance to maximaze functoinal gains  (Target Date: 01/25/2016)   Time 8   Period Weeks   Status On-going   PT LONG TERM GOAL #2   Title Pt will be able to ambulate >1000' including grass, ramps, curbs and stairs (1 rail) without device modifed independent to indicate community level mobilty.  (Target Date: 01/25/2016)   Time 8   Period Weeks   Status On-going   PT LONG TERM GOAL #3   Title Pt will be able to improve Berg Balance Score from 22/56 to >36/56 to indicate improve balance (Target Date: 01/25/2016)   Time 8   Period Weeks   Status On-going   PT LONG TERM GOAL #4   Title Pt will be able to improve Dynamic Gait Index from 7/24 to >/= 14/30 to indicate decreased risk for falls (Target Date: 01/25/2016)   Time 8   Period Weeks   Status On-going   PT LONG TERM GOAL #5   Title Pt will be able to improve Timed Up-Go from 15.60s to < 13.5sec to indicate decreased risk for falls (Target Date: 01/25/2016)   Time 8   Period Weeks   Status On-going               Plan - 12/06/15 1017    Clinical Impression Statement [name] helps pt follow through with HEP  directions.  Pt performed Ortago well during session with min cues for each exercise; required intermittant UE support.   Rehab Potential Good  Clinical Impairments Affecting Rehab Potential Patient comorbitiies with limitations from back pain   PT Frequency 2x / week   PT Duration 8 weeks   PT Treatment/Interventions ADLs/Self Care Home Management;Therapeutic exercise;Therapeutic activities;Functional mobility training;Stair training;Gait training;DME Instruction;Balance training;Neuromuscular re-education;Patient/family education;Orthotic Fit/Training;Passive range of motion   PT Next Visit Plan Check [place] HEP and advance exercises & gait   Consulted and Agree with Plan of Care Patient;Family member/caregiver   Family Member Consulted wife      Patient will benefit from skilled therapeutic intervention in order to improve the following deficits and impairments:  Abnormal gait, Decreased activity tolerance, Cardiopulmonary status limiting activity, Decreased balance, Decreased strength, Postural dysfunction, Improper body mechanics, Decreased endurance, Decreased knowledge of use of DME, Difficulty walking  Visit Diagnosis: Other abnormalities of gait and mobility  Unsteadiness on feet  Difficulty in walking, not elsewhere classified  Midline low back pain without sciatica  History of falling     Problem List Patient Active Problem List   Diagnosis Date Noted  . CAD (coronary artery disease) 12/03/2015  . Atrial fibrillation ([place]) 11/21/2015  . STEMI (ST elevation myocardial infarction) ([place]) 11/19/2015  . Acute ST elevation myocardial infarction (STEMI) of anterolateral wall ([place])   . Pruritus 01/02/2015  . History of CVA (cerebrovascular accident) 05/31/2014  . Pulmonary embolism ([place]) 05/20/2014  . HTN (hypertension) 05/20/2014  . Unspecified late effects of cerebrovascular disease 05/20/2014  . Late effects of cerebrovascular disease   . Prostate cancer ([place])  04/05/2014  . Spinal stenosis of lumbar region 11/15/2013  . Falls 05/10/2013  . Hyperlipidemia   . Benign essential tremor   . Supraventricular premature beats   . Lumbago   . Palpitations   . Ingrown toenail without infection 09/07/2012    Bjorn Loser, PTA  12/06/2015, 10:35 AM [facility] [address] [place], [place], [address] Phone: [phone]   Fax:  [phone]  Name: Henry Franklin. MRN: [id number] Date of Birth: 09/21/1926

## 2015-12-06 NOTE — Assessment & Plan Note (Signed)
Henry Franklin has been instructed to restart Eliquis for risk mitigation in the setting of atrial fibrillation.  Plan: He will resume Eliquis on July 20 per cardiology recommendations If cardiology feels that Eliquis is no longer indicated for risk reduction or is too risky considering his known recurrent falls, then I would support stopping this medication.  Follow-up with [place] on an as-needed basis

## 2015-12-07 ENCOUNTER — Telehealth: Payer: Self-pay | Admitting: *Deleted

## 2015-12-07 ENCOUNTER — Telehealth: Payer: Self-pay | Admitting: Physician Assistant

## 2015-12-07 NOTE — Telephone Encounter (Signed)
Patient would like a call from Richardson Dopp in regards to the eliquis. He stated that he just would really like to discuss the medication with him and would really appreciate a call at [phone]. Thanks, MI

## 2015-12-07 NOTE — Telephone Encounter (Signed)
New message      Pt has not been contacted by nutritionist for an appt.  Wife has a question about mixing regular peanut butter with low sodium peanut butter.  Please call

## 2015-12-07 NOTE — Telephone Encounter (Signed)
Lmom ok to mix regular peanut butter w/low Na peanut butter. Just make sure in moderation. I will have PCC's on Monday check on referral to [place].

## 2015-12-07 NOTE — Telephone Encounter (Signed)
Arbie Cookey Can you call today to see what he needs? I can call back on Monday with any specific questions. Thanks, Richardson Dopp, PA-C   12/07/2015 12:54 PM

## 2015-12-07 NOTE — Telephone Encounter (Signed)
S/w pt in regards to Eliquis question. Pt stated he saw Dr. [name] the other day and stated Dr. [name] is fine with pt starting the Eliquis 5 mg BID, pt will start this on 12/20/15 per Brynda [place]. PAC ov note 12/03/15. Pt thanked [place] for our help.

## 2015-12-08 NOTE — Telephone Encounter (Signed)
He should not start Eliquis until his stops the ASA. Last day of ASA 7/19 First day of Eliquis 7/20 Richardson Dopp, PA-C   12/08/2015 7:37 PM

## 2015-12-10 NOTE — Telephone Encounter (Signed)
Pt advised 7/7 last dose of ASA 7/19 and start Eliquis 7/20.

## 2015-12-10 NOTE — Telephone Encounter (Signed)
Pt was advised on 12/07/15 last dose of ASA 7/19 and will start Eliquis 7/20.

## 2015-12-11 ENCOUNTER — Ambulatory Visit: Payer: PPO | Admitting: Adult Health

## 2015-12-11 ENCOUNTER — Ambulatory Visit: Payer: PPO | Admitting: Physical Therapy

## 2015-12-11 ENCOUNTER — Encounter: Payer: Self-pay | Admitting: Physical Therapy

## 2015-12-11 VITALS — HR 56

## 2015-12-11 DIAGNOSIS — R2689 Other abnormalities of gait and mobility: Secondary | ICD-10-CM

## 2015-12-11 DIAGNOSIS — M545 Low back pain, unspecified: Secondary | ICD-10-CM

## 2015-12-11 DIAGNOSIS — R2681 Unsteadiness on feet: Secondary | ICD-10-CM

## 2015-12-11 DIAGNOSIS — R262 Difficulty in walking, not elsewhere classified: Secondary | ICD-10-CM

## 2015-12-11 DIAGNOSIS — Z9181 History of falling: Secondary | ICD-10-CM

## 2015-12-12 NOTE — Therapy (Signed)
[facility] [address] [place], [place], [address] Phone: [phone]   Fax:  [phone]  Physical Therapy Treatment  Patient Details  Name: Henry Franklin. MRN: [id number] Date of Birth: [birth year] Referring Provider: [name] MD  Encounter Date: 12/11/2015      PT End of Session - 12/11/15 1538    Visit Number 4   Number of Visits 17   Date for PT Re-Evaluation 01/25/16   Authorization Type Medicare- G codes needed   PT Start Time 14-Sep-1531   PT Stop Time 1615   PT Time Calculation (min) 42 min   Equipment Utilized During Treatment Gait belt   Activity Tolerance Patient tolerated treatment well   Behavior During Therapy WFL for tasks assessed/performed      Past Medical History  Diagnosis Date  . HTN (hypertension)   . History of stroke   . History of TIA (transient ischemic attack)   . Hyperlipidemia   . Malignant neoplasm of prostate ([place])   . Essential and other specified forms of tremor   . Supraventricular premature beats   . PAF (paroxysmal atrial fibrillation) ([place])   . External hemorrhoids without mention of complication   . Unspecified anomaly of tooth position   . Sebaceous cyst   . Lumbago   . Nontraumatic rupture of tendons of biceps (long head)   . History of ST elevation myocardial infarction (STEMI)     a. Ant-Lat STEMI >> DES to D2 subbranch  . CAD (coronary artery disease)     a. STEMI 6/17: LHC - oLAD 10, mLAD 50, dLAD 20, D2 inf subbranch 70 + sup subbranch 100, pRCA 20, oRPDA 40, EF 35-45% >> PCI:  2.25 x 12 mm resolute integrity DES to the superior subbranch of D2   . Paroxysmal atrial fibrillation (HCC)     during admit for STEMI 6/17 >> triple Rx high risk >> ASA + Plavix x 30 days post MI, then Eliquis + Plavix  . Ischemic cardiomyopathy     a. Echo 6/17: Mid and distal anterior/inferior, septal and apical HK, mild concentric LVH, EF 30-35%, mild LAE    Past Surgical  History  Procedure Laterality Date  . Prostate cancer    . Back surgery      with sciatica  . Appendectomy  1940/09/13  . Spine surgery N/A 14-Sep-1998    [place]  . Cardiac catheterization N/A 11/19/2015    Procedure: Left Heart Cath and Coronary Angiography;  Surgeon: Burnell Blanks, MD;  Location: [facility];  Service: Cardiovascular;  Laterality: N/A;  . Cardiac catheterization N/A 11/19/2015    Procedure: Coronary Stent Intervention;  Surgeon: Burnell Blanks, MD;  Location: [facility];  Service: Cardiovascular;  Laterality: N/A;    Filed Vitals:   12/11/15 1536 12/12/15 1546  Pulse: 43 56  SpO2: 96% 97%        Subjective Assessment - 12/11/15 1536    Subjective No new complaints. No falls to report. No pain to report.   Patient is accompained by: Family member   Pertinent History HTN, CVA, TIA, HLD, prostate CA, idiopathic PE, back sx in Sep 14, 1998   Limitations Standing;Walking;Lifting   Patient Stated Goals To improve balance and walking   Currently in Pain? No/denies   Pain Score 0-No pain            [facility] Adult PT Treatment/Exercise - 12/11/15 1548    Transfers   Transfers Sit to  Stand;Stand to Sit   Sit to Stand 5: Supervision;Without upper extremity assist;From chair/3-in-1   Sit to Stand Details Verbal cues for sequencing   Sit to Stand Details (indicate cue type and reason) increased time needed with pt is fatigued   Stand to Sit 5: Supervision;Without upper extremity assist;To chair/3-in-1   Stand to Sit Details (indicate cue type and reason) Verbal cues for technique   Stand to Sit Details cues to use arms with sitting to control descent when fatigued.   Ambulation/Gait   Ambulation/Gait Yes   Ambulation/Gait Assistance 4: Min guard;4: Min assist   Ambulation/Gait Assistance Details cues on posture, increased step length, upright posture/look forward with gait. SaO2 97%, HR 53, dyspnea 2/4 after 1st gait trial. HR 54, SaO2 98% and dyspnea 1-2/4  after second gait trial. increase assistance needed for balance when pt scans enviroment/distracted/has to negotiate obstacles/people                     Ambulation Distance (Feet) 345 Feet  x 2 reps   Assistive device None   Gait Pattern Step-through pattern;Decreased stride length;Shuffle;Trunk flexed   Ambulation Surface Level;Indoor   Knee/Hip Exercises: Aerobic   Other Aerobic Scifit x 4 extremities level 1.0 x 5 minutes with rpm between 60-65 for strengthening and activity tolerance   Knee/Hip Exercises: Standing   Heel Raises Both;1 set;10 reps;3 seconds;Limitations  toe raises performed as well, separately from heel raises.   Heel Raises Limitations 3 sec hold with each rep of heel raises, toe raises, light UE support on chair back for balance. cues needed for posture/look forward   Other Standing Knee Exercises sit<>stand x 10 reps with no UE assist, min guard assist. progressed to sit<>stand with OH press using 2# weight ball, 2 sets of 10 reps min guard assist and cues on form/posture. HR 56, SaO2 97% afterwards.              PT Short Term Goals - 12/03/15 1700    PT SHORT TERM GOAL #1   Title Pt will be able to ambulate 300' around obstales with supervision (Target Date: 12/28/2015)   Time 4   Period Weeks   Status On-going   PT SHORT TERM GOAL #2   Title Patient ambulates with head turns to scan environment and is able to maintain path with supervision. (Target Date: 12/28/2015)   Time 4   Period Weeks   Status On-going   PT SHORT TERM GOAL #3   Title Patient demonstrates understanding of initial HEP. (Target Date: 12/28/2015)   Time 4   Period Weeks   Status On-going   PT SHORT TERM GOAL #4   Title Patient reaches 10" anteriorly & to floor without UE support safely. (Target Date: 12/28/2015)   Time 4   Period Weeks   Status On-going           PT Long Term Goals - 12/03/15 1700    PT LONG TERM GOAL #1   Title Pt will be independent with HEP for  strengthening and balance to maximaze functoinal gains  (Target Date: 01/25/2016)   Time 8   Period Weeks   Status On-going   PT LONG TERM GOAL #2   Title Pt will be able to ambulate >1000' including grass, ramps, curbs and stairs (1 rail) without device modifed independent to indicate community level mobilty.  (Target Date: 01/25/2016)   Time 8   Period Weeks   Status On-going   PT LONG  TERM GOAL #3   Title Pt will be able to improve Berg Balance Score from 22/56 to >36/56 to indicate improve balance (Target Date: 01/25/2016)   Time 8   Period Weeks   Status On-going   PT LONG TERM GOAL #4   Title Pt will be able to improve Dynamic Gait Index from 7/24 to >/= 14/30 to indicate decreased risk for falls (Target Date: 01/25/2016)   Time 8   Period Weeks   Status On-going   PT LONG TERM GOAL #5   Title Pt will be able to improve Timed Up-Go from 15.60s to < 13.5sec to indicate decreased risk for falls (Target Date: 01/25/2016)   Time 8   Period Weeks   Status On-going               Plan - 12/11/15 1538    Clinical Impression Statement Today's session addressed gait safety/correction of gait deviations and general strengthening. Pt's oxygen saturations remained >/=95% throughout and his HR elevated approriately with activity (does return to resting numbers in 40's with rest). Pt is making steady progress toward goals.    Rehab Potential Good   Clinical Impairments Affecting Rehab Potential Patient comorbitiies with limitations from back pain   PT Frequency 2x / week   PT Duration 8 weeks   PT Treatment/Interventions ADLs/Self Care Home Management;Therapeutic exercise;Therapeutic activities;Functional mobility training;Stair training;Gait training;DME Instruction;Balance training;Neuromuscular re-education;Patient/family education;Orthotic Fit/Training;Passive range of motion   PT Next Visit Plan continue to work on balance, strengthening and dynamic gait toward goals.    Consulted and Agree with Plan of Care Patient;Family member/caregiver   Family Member Consulted wife      Patient will benefit from skilled therapeutic intervention in order to improve the following deficits and impairments:  Abnormal gait, Decreased activity tolerance, Cardiopulmonary status limiting activity, Decreased balance, Decreased strength, Postural dysfunction, Improper body mechanics, Decreased endurance, Decreased knowledge of use of DME, Difficulty walking  Visit Diagnosis: Other abnormalities of gait and mobility  Unsteadiness on feet  Difficulty in walking, not elsewhere classified  Midline low back pain without sciatica  History of falling     Problem List Patient Active Problem List   Diagnosis Date Noted  . CAD (coronary artery disease) 12/03/2015  . Atrial fibrillation ([place]) 11/21/2015  . STEMI (ST elevation myocardial infarction) ([place]) 11/19/2015  . Acute ST elevation myocardial infarction (STEMI) of anterolateral wall ([place])   . Pruritus 01/02/2015  . History of CVA (cerebrovascular accident) 05/31/2014  . Pulmonary embolism ([place]) 05/20/2014  . HTN (hypertension) 05/20/2014  . Unspecified late effects of cerebrovascular disease 05/20/2014  . Late effects of cerebrovascular disease   . Prostate cancer ([place]) 04/05/2014  . Spinal stenosis of lumbar region 11/15/2013  . Falls 05/10/2013  . Hyperlipidemia   . Benign essential tremor   . Supraventricular premature beats   . Lumbago   . Palpitations   . Ingrown toenail without infection 09/07/2012    Willow Ora, PTA, [facility] [address], [place], [place] [address] [phone] 12/12/2015, 3:51 PM   Name: Devontay Gerstenberger. MRN: [id number] Date of Birth: 13-Aug-1926

## 2015-12-13 ENCOUNTER — Ambulatory Visit: Payer: PPO | Admitting: Physical Therapy

## 2015-12-13 ENCOUNTER — Encounter: Payer: Self-pay | Admitting: Physical Therapy

## 2015-12-13 DIAGNOSIS — R262 Difficulty in walking, not elsewhere classified: Secondary | ICD-10-CM

## 2015-12-13 DIAGNOSIS — R2689 Other abnormalities of gait and mobility: Secondary | ICD-10-CM | POA: Diagnosis not present

## 2015-12-13 DIAGNOSIS — R2681 Unsteadiness on feet: Secondary | ICD-10-CM

## 2015-12-14 NOTE — Therapy (Signed)
[facility] [address] [place], [place], [address] Phone: [phone]   Fax:  [phone]  Physical Therapy Treatment  Patient Details  Name: Henry Franklin. MRN: [id number] Date of Birth: 01/14/1927 Referring Provider: [name] MD  Encounter Date: 12/13/2015      PT End of Session - 12/13/15 1408    Visit Number 5   Number of Visits 17   Date for PT Re-Evaluation 01/25/16   Authorization Type Medicare- G codes needed   PT Start Time September 28, 1314   PT Stop Time 1400   PT Time Calculation (min) 44 min   Equipment Utilized During Treatment Gait belt   Activity Tolerance Patient tolerated treatment well   Behavior During Therapy WFL for tasks assessed/performed      Past Medical History  Diagnosis Date  . HTN (hypertension)   . History of stroke   . History of TIA (transient ischemic attack)   . Hyperlipidemia   . Malignant neoplasm of prostate ([place])   . Essential and other specified forms of tremor   . Supraventricular premature beats   . PAF (paroxysmal atrial fibrillation) ([place])   . External hemorrhoids without mention of complication   . Unspecified anomaly of tooth position   . Sebaceous cyst   . Lumbago   . Nontraumatic rupture of tendons of biceps (long head)   . History of ST elevation myocardial infarction (STEMI)     a. Ant-Lat STEMI >> DES to D2 subbranch  . CAD (coronary artery disease)     a. STEMI 6/17: LHC - oLAD 10, mLAD 50, dLAD 20, D2 inf subbranch 70 + sup subbranch 100, pRCA 20, oRPDA 40, EF 35-45% >> PCI:  2.25 x 12 mm resolute integrity DES to the superior subbranch of D2   . Paroxysmal atrial fibrillation (HCC)     during admit for STEMI 6/17 >> triple Rx high risk >> ASA + Plavix x 30 days post MI, then Eliquis + Plavix  . Ischemic cardiomyopathy     a. Echo 6/17: Mid and distal anterior/inferior, septal and apical HK, mild concentric LVH, EF 30-35%, mild LAE    Past Surgical  History  Procedure Laterality Date  . Prostate cancer    . Back surgery      with sciatica  . Appendectomy  27-Sep-1940  . Spine surgery N/A September 28, 1998    [facility]  . Cardiac catheterization N/A 11/19/2015    Procedure: Left Heart Cath and Coronary Angiography;  Surgeon: Burnell Blanks, MD;  Location: [facility];  Service: Cardiovascular;  Laterality: N/A;  . Cardiac catheterization N/A 11/19/2015    Procedure: Coronary Stent Intervention;  Surgeon: Burnell Blanks, MD;  Location: [facility];  Service: Cardiovascular;  Laterality: N/A;    There were no vitals filed for this visit.      Subjective Assessment - 12/13/15 1321    Subjective (p) No falls or issues. Exercises are going well.    Patient is accompained by: (p) Family member   Pertinent History (p) HTN, CVA, TIA, HLD, prostate CA, idiopathic PE, back sx in 09-28-98   Limitations (p) Standing;Walking;Lifting   Patient Stated Goals (p) To improve balance and walking   Currently in Pain? (p) No/denies                   Neuromuscular Re-education with tactile cues for balance reactions & posture in parallel bars with UE assist: Standing crossways on foam beam:  static, head turns, alternate stepping forward on/off, alternate stepping on/off backwards, stepping over beam with UE support then hold step forward position for 3-5 seconds with tactile cues.  Sitting on 24" stool with minA / tactile cues: forward lean with back extension / UE abduction, trunk rotation, hip flex /stepping over 4" beam to right, center, left, center... Standing with wide stance: reciprocally then BUE light resisitance of red band: rowing, bicep curls, forward reach.      [facility] Adult PT Treatment/Exercise - 12/13/15 1315    Transfers   Comments Sit to/from stand from 24" stool without UE support to arise & stabilize with tactile cues.    High Level Balance   High Level Balance Activities Side stepping;Braiding;Backward walking   crossovers in front & in back   High Level Balance Comments Parallel bars with BUE support then single UE support             Balance Exercises - 12/13/15 1315    Balance Exercises: Standing   Sidestepping 5 reps  intermittent UE support             PT Short Term Goals - 12/03/15 1700    PT SHORT TERM GOAL #1   Title Pt will be able to ambulate 300' around obstales with supervision (Target Date: 12/28/2015)   Time 4   Period Weeks   Status On-going   PT SHORT TERM GOAL #2   Title Patient ambulates with head turns to scan environment and is able to maintain path with supervision. (Target Date: 12/28/2015)   Time 4   Period Weeks   Status On-going   PT SHORT TERM GOAL #3   Title Patient demonstrates understanding of initial HEP. (Target Date: 12/28/2015)   Time 4   Period Weeks   Status On-going   PT SHORT TERM GOAL #4   Title Patient reaches 10" anteriorly & to floor without UE support safely. (Target Date: 12/28/2015)   Time 4   Period Weeks   Status On-going           PT Long Term Goals - 12/03/15 1700    PT LONG TERM GOAL #1   Title Pt will be independent with HEP for strengthening and balance to maximaze functoinal gains  (Target Date: 01/25/2016)   Time 8   Period Weeks   Status On-going   PT LONG TERM GOAL #2   Title Pt will be able to ambulate >1000' including grass, ramps, curbs and stairs (1 rail) without device modifed independent to indicate community level mobilty.  (Target Date: 01/25/2016)   Time 8   Period Weeks   Status On-going   PT LONG TERM GOAL #3   Title Pt will be able to improve Berg Balance Score from 22/56 to >36/56 to indicate improve balance (Target Date: 01/25/2016)   Time 8   Period Weeks   Status On-going   PT LONG TERM GOAL #4   Title Pt will be able to improve Dynamic Gait Index from 7/24 to >/= 14/30 to indicate decreased risk for falls (Target Date: 01/25/2016)   Time 8   Period Weeks   Status On-going   PT LONG  TERM GOAL #5   Title Pt will be able to improve Timed Up-Go from 15.60s to < 13.5sec to indicate decreased risk for falls (Target Date: 01/25/2016)   Time 8   Period Weeks   Status On-going               Plan -  12/13/15 1408    Clinical Impression Statement Patient's heart rate remained in low 40's with activity with no physiological changes. Patient improved balance reactions with repetition & skilled instruction.    Rehab Potential Good   Clinical Impairments Affecting Rehab Potential Patient comorbitiies with limitations from back pain   PT Frequency 2x / week   PT Duration 8 weeks   PT Treatment/Interventions ADLs/Self Care Home Management;Therapeutic exercise;Therapeutic activities;Functional mobility training;Stair training;Gait training;DME Instruction;Balance training;Neuromuscular re-education;Patient/family education;Orthotic Fit/Training;Passive range of motion   PT Next Visit Plan continue to work on balance, strengthening and dynamic gait toward goals.   Consulted and Agree with Plan of Care Patient;Family member/caregiver   Family Member Consulted wife      Patient will benefit from skilled therapeutic intervention in order to improve the following deficits and impairments:  Abnormal gait, Decreased activity tolerance, Cardiopulmonary status limiting activity, Decreased balance, Decreased strength, Postural dysfunction, Improper body mechanics, Decreased endurance, Decreased knowledge of use of DME, Difficulty walking  Visit Diagnosis: Other abnormalities of gait and mobility  Unsteadiness on feet  Difficulty in walking, not elsewhere classified     Problem List Patient Active Problem List   Diagnosis Date Noted  . CAD (coronary artery disease) 12/03/2015  . Atrial fibrillation ([place]) 11/21/2015  . STEMI (ST elevation myocardial infarction) ([place]) 11/19/2015  . Acute ST elevation myocardial infarction (STEMI) of anterolateral wall ([place])   . Pruritus  01/02/2015  . History of CVA (cerebrovascular accident) 05/31/2014  . Pulmonary embolism ([facility]) 05/20/2014  . HTN (hypertension) 05/20/2014  . Unspecified late effects of cerebrovascular disease 05/20/2014  . Late effects of cerebrovascular disease   . Prostate cancer ([place]) 04/05/2014  . Spinal stenosis of lumbar region 11/15/2013  . Falls 05/10/2013  . Hyperlipidemia   . Benign essential tremor   . Supraventricular premature beats   . Lumbago   . Palpitations   . Ingrown toenail without infection 09/07/2012    Sheril Hammond PT, DPT 12/14/2015, 2:11 PM  [facility] [address] [place], [place], [address] Phone: [phone]   Fax:  [phone]  Name: Raney Schubbe. MRN: [id number] Date of Birth: [birth year]

## 2015-12-17 ENCOUNTER — Ambulatory Visit: Payer: PPO | Admitting: Physical Therapy

## 2015-12-17 ENCOUNTER — Encounter: Payer: PPO | Admitting: Physical Therapy

## 2015-12-17 DIAGNOSIS — R2689 Other abnormalities of gait and mobility: Secondary | ICD-10-CM | POA: Diagnosis not present

## 2015-12-17 DIAGNOSIS — R2681 Unsteadiness on feet: Secondary | ICD-10-CM

## 2015-12-17 DIAGNOSIS — R262 Difficulty in walking, not elsewhere classified: Secondary | ICD-10-CM

## 2015-12-17 DIAGNOSIS — M545 Low back pain, unspecified: Secondary | ICD-10-CM

## 2015-12-17 DIAGNOSIS — Z9181 History of falling: Secondary | ICD-10-CM

## 2015-12-17 NOTE — Patient Instructions (Signed)
Posture strengthening/ education Sitting on 24" stool with minA / tactile cues: forward lean with back extension / UE abduction, transitioning in to sit to stand (supervision) x10 Standing trunk rotation with UE abducted then reaching to the side x10                  Comments      High Level Balance     High Level Balance Activities  Side stepping;Braiding;Backward walking  crossovers in front & in back     High Level Balance Comments  Parallel bars with BUE support then single UE support

## 2015-12-17 NOTE — Therapy (Signed)
[facility] [address] [place], [place], [address] Phone: [phone]   Fax:  [phone]  Physical Therapy Treatment  Patient Details  Name: Henry Franklin. MRN: [id number] Date of Birth: [birth year] Referring Provider: [name] MD  Encounter Date: 12/17/2015      PT End of Session - 12/17/15 1102    Visit Number 6   Number of Visits 17   Date for PT Re-Evaluation 01/25/16   Authorization Type Medicare- G codes needed   PT Start Time 1020   PT Stop Time 1100   PT Time Calculation (min) 40 min   Equipment Utilized During Treatment Gait belt   Activity Tolerance Patient tolerated treatment well   Behavior During Therapy WFL for tasks assessed/performed      Past Medical History  Diagnosis Date  . HTN (hypertension)   . History of stroke   . History of TIA (transient ischemic attack)   . Hyperlipidemia   . Malignant neoplasm of prostate ([place])   . Essential and other specified forms of tremor   . Supraventricular premature beats   . PAF (paroxysmal atrial fibrillation) ([place])   . External hemorrhoids without mention of complication   . Unspecified anomaly of tooth position   . Sebaceous cyst   . Lumbago   . Nontraumatic rupture of tendons of biceps (long head)   . History of ST elevation myocardial infarction (STEMI)     a. Ant-Lat STEMI >> DES to D2 subbranch  . CAD (coronary artery disease)     a. STEMI 6/17: LHC - oLAD 10, mLAD 50, dLAD 20, D2 inf subbranch 70 + sup subbranch 100, pRCA 20, oRPDA 40, EF 35-45% >> PCI:  2.25 x 12 mm resolute integrity DES to the superior subbranch of D2   . Paroxysmal atrial fibrillation (HCC)     during admit for STEMI 6/17 >> triple Rx high risk >> ASA + Plavix x 30 days post MI, then Eliquis + Plavix  . Ischemic cardiomyopathy     a. Echo 6/17: Mid and distal anterior/inferior, septal and apical HK, mild concentric LVH, EF 30-35%, mild LAE    Past Surgical  History  Procedure Laterality Date  . Prostate cancer    . Back surgery      with sciatica  . Appendectomy  1942  . Spine surgery N/A 2000    [facility]  . Cardiac catheterization N/A 11/19/2015    Procedure: Left Heart Cath and Coronary Angiography;  Surgeon: Burnell Blanks, MD;  Location: [facility];  Service: Cardiovascular;  Laterality: N/A;  . Cardiac catheterization N/A 11/19/2015    Procedure: Coronary Stent Intervention;  Surgeon: Burnell Blanks, MD;  Location: [facility];  Service: Cardiovascular;  Laterality: N/A;    There were no vitals filed for this visit.      Subjective Assessment - 12/17/15 1023    Subjective No new complaints. No falls.  Wife thinks that pt is walking better greater foot clearance and posture.   Patient is accompained by: Family member   Pertinent History HTN, CVA, TIA, HLD, prostate CA, idiopathic PE, back sx in 2000   Limitations Standing;Walking;Lifting   Patient Stated Goals To improve balance and walking   Currently in Pain? No/denies             Posture strengthening/ education  Sitting on 24" stool with minA / tactile cues: forward lean with back extension / UE abduction, transitioning in to  sit to stand (supervision) x10  Standing trunk rotation with UE abducted then reaching to the side x10 manual cues for posture and hip rotation                  Comments      High Level Balance     High Level Balance Activities  Side stepping;Braiding;Backward walking  crossovers in front & in back     High Level Balance Comments  Parallel bars with BUE support then single UE support              Balance Exercises - 12/17/15 1053    Balance Exercises: Standing   Step Over Hurdles / Cones amb. forward with alt. taps on cones. Pt required HHA+1  demonstrating anxiety with task.                                                             PT Short Term Goals - 12/03/15 1700     PT SHORT TERM GOAL #1   Title Pt will be able to ambulate 300' around obstales with supervision (Target Date: 12/28/2015)   Time 4   Period Weeks   Status On-going   PT SHORT TERM GOAL #2   Title Patient ambulates with head turns to scan environment and is able to maintain path with supervision. (Target Date: 12/28/2015)   Time 4   Period Weeks   Status On-going   PT SHORT TERM GOAL #3   Title Patient demonstrates understanding of initial HEP. (Target Date: 12/28/2015)   Time 4   Period Weeks   Status On-going   PT SHORT TERM GOAL #4   Title Patient reaches 10" anteriorly & to floor without UE support safely. (Target Date: 12/28/2015)   Time 4   Period Weeks   Status On-going           PT Long Term Goals - 12/03/15 1700    PT LONG TERM GOAL #1   Title Pt will be independent with HEP for strengthening and balance to maximaze functoinal gains  (Target Date: 01/25/2016)   Time 8   Period Weeks   Status On-going   PT LONG TERM GOAL #2   Title Pt will be able to ambulate >1000' including grass, ramps, curbs and stairs (1 rail) without device modifed independent to indicate community level mobilty.  (Target Date: 01/25/2016)   Time 8   Period Weeks   Status On-going   PT LONG TERM GOAL #3   Title Pt will be able to improve Berg Balance Score from 22/56 to >36/56 to indicate improve balance (Target Date: 01/25/2016)   Time 8   Period Weeks   Status On-going   PT LONG TERM GOAL #4   Title Pt will be able to improve Dynamic Gait Index from 7/24 to >/= 14/30 to indicate decreased risk for falls (Target Date: 01/25/2016)   Time 8   Period Weeks   Status On-going   PT LONG TERM GOAL #5   Title Pt will be able to improve Timed Up-Go from 15.60s to < 13.5sec to indicate decreased risk for falls (Target Date: 01/25/2016)   Time 8   Period Weeks   Status On-going  Plan - 12/17/15 1103    Clinical Impression Statement Pt demonstrates anxiety with balance  tasks requiring decreased or no UE support requiring gentle encouragement to progress task for functional mobility gains.   Rehab Potential Good   Clinical Impairments Affecting Rehab Potential Patient comorbitiies with limitations from back pain   PT Frequency 2x / week   PT Duration 8 weeks   PT Treatment/Interventions ADLs/Self Care Home Management;Therapeutic exercise;Therapeutic activities;Functional mobility training;Stair training;Gait training;DME Instruction;Balance training;Neuromuscular re-education;Patient/family education;Orthotic Fit/Training;Passive range of motion   PT Next Visit Plan continue to work on balance, strengthening and dynamic gait toward goals.   Consulted and Agree with Plan of Care Patient;Family member/caregiver   Family Member Consulted wife      Patient will benefit from skilled therapeutic intervention in order to improve the following deficits and impairments:  Abnormal gait, Decreased activity tolerance, Cardiopulmonary status limiting activity, Decreased balance, Decreased strength, Postural dysfunction, Improper body mechanics, Decreased endurance, Decreased knowledge of use of DME, Difficulty walking  Visit Diagnosis: Other abnormalities of gait and mobility  Unsteadiness on feet  Difficulty in walking, not elsewhere classified  Midline low back pain without sciatica  History of falling     Problem List Patient Active Problem List   Diagnosis Date Noted  . CAD (coronary artery disease) 12/03/2015  . Atrial fibrillation ([place] [facility]) 11/21/2015  . STEMI (ST elevation myocardial infarction) ([place]) 11/19/2015  . Acute ST elevation myocardial infarction (STEMI) of anterolateral wall ([place])   . Pruritus 01/02/2015  . History of CVA (cerebrovascular accident) 05/31/2014  . Pulmonary embolism ([facility]) 05/20/2014  . HTN (hypertension) 05/20/2014  . Unspecified late effects of cerebrovascular disease 05/20/2014  . Late effects of cerebrovascular disease   .  Prostate cancer ([place]) 04/05/2014  . Spinal stenosis of lumbar region 11/15/2013  . Falls 05/10/2013  . Hyperlipidemia   . Benign essential tremor   . Supraventricular premature beats   . Lumbago   . Palpitations   . Ingrown toenail without infection 09/07/2012    Bjorn Loser, PTA  12/17/2015, 11:18 AM [facility] [address] [place], [place], [address] Phone: [phone]   Fax:  [phone]  Name: Henry Franklin. MRN: [id number] Date of Birth: [birth year]

## 2015-12-18 ENCOUNTER — Telehealth (HOSPITAL_COMMUNITY): Payer: Self-pay | Admitting: *Deleted

## 2015-12-18 ENCOUNTER — Other Ambulatory Visit: Payer: Self-pay

## 2015-12-18 NOTE — Patient Outreach (Addendum)
[facility]) Care Management  12/18/2015  Henry Franklin 13-Jun-1926 [id number]   REFERRAL SOURCE: [facility] referral for health team advantage member REFERRAL REASON:  Recent discharge from hospital due to Myocardial infarction and on new medications.  Patient could benefit from ongoing health education related to new medications and new diagnosis of Myocardial infarction  CONSENT:  Self  SUBJECTIVE: Telephone call to patient regarding [facility] referral. HIPAA verified with patient. Discussed and offered [facility] care management services to patient. Patient verbally agreed to telephonic follow up with RNCM. Patient states he was in the hospital for a heart attack in June 2017.  Patient states he had a stent placed due to a blockage in one of his arteries. Patient states he is having outpatient physical therapy because he has been having balance issues. Patient states this was a problem for him over a year or so.  Patient states he has had discussion with his doctor regarding cardiac rehabilitation. Patient states he would rather do the cardiac rehab. Patient states he does not feel as thought the physical therapy for his balance is doing much for him. Patient states he has had a history of having back surgery.  Patient states he has calcium build up in his back. Patient reports he is taking his medications as prescribed. Patient states he understands his medications and he is able to afford them.  Patient states his wife puts his medications out for him to take daily.  Patient states he has had 1 fall within the past year. Patient states he slipped in the bathroom. Patient denies any severe injury with fall.  Patient states he did not go to the doctor regarding the fall.  Patient states he is adhering to a low salt diet.  Patient states his wife is very strict with him regarding the low salt diet.  Patient states his food is bland and not as enjoyable.  Patient reports he was very  active with exercise until fairly recently. Patient states he has always been athletic and in good shape.  Patient states he has a follow up appointment with a new primary doctor in August 2017.  Patient states he sees his cardiologist, regularly. Patient states his cardiologist is Dr. Julianne Handler and his pulmonologist is Dr. Simonne Maffucci.  Patient states he had a pulmonary embolism in 2016.   RNCM advised patient to contact his doctor for signs of bleeding due to being on blood thinning medication. Patient verbalized understanding.  RNCM informed patient that she would send him a [facility] care management packet with consent form. Patient verbalized understanding.  Patient agreed to receive any EMMI education material.   ASSESSMENT;  82 yo male with PMH of HTN/Stroke/TIA/HLD/Prostate Ca and idiopathic PE (previously on Eliquis stopped on 11/17/2015).   Admitted 6/19-6/22 with an anterolateral STEMI.Acute ST elevation myocardial infarction (STEMI) of anterolateral wall (HCC), STEMI (ST elevation myocardial infarction) [facility])   PLAN:  RNCM will follow up with patient within 2 weeks RNCM will send patient [facility] care management packet with welcome letter and consent form.  RNCM will send patient EMMI education material to patient on: Being active after your heart attack, low salt diet, and fall prevention.   Quinn Plowman RN,BSN,CCM [facility] Telephonic  [phone]

## 2015-12-20 ENCOUNTER — Ambulatory Visit: Payer: PPO | Admitting: Physical Therapy

## 2015-12-20 DIAGNOSIS — Z9181 History of falling: Secondary | ICD-10-CM

## 2015-12-20 DIAGNOSIS — M545 Low back pain, unspecified: Secondary | ICD-10-CM

## 2015-12-20 DIAGNOSIS — R2681 Unsteadiness on feet: Secondary | ICD-10-CM

## 2015-12-20 DIAGNOSIS — R2689 Other abnormalities of gait and mobility: Secondary | ICD-10-CM | POA: Diagnosis not present

## 2015-12-20 DIAGNOSIS — R262 Difficulty in walking, not elsewhere classified: Secondary | ICD-10-CM

## 2015-12-20 NOTE — Therapy (Signed)
[facility] [address] [place], [place], [address] Phone: [phone]   Fax:  [phone]  Physical Therapy Treatment  Patient Details  Name: Henry Franklin. MRN: [id number] Date of Birth: [birth year] Referring Provider: [name] MD  Encounter Date: 12/20/2015      PT End of Session - 12/20/15 1555    Visit Number 7   Number of Visits 17   Date for PT Re-Evaluation 01/25/16   Authorization Type Medicare- G codes needed   PT Start Time [id number]   PT Stop Time 1537   PT Time Calculation (min) 45 min   Equipment Utilized During Treatment Gait belt   Activity Tolerance Patient tolerated treatment well   Behavior During Therapy [facility] for tasks assessed/performed      Past Medical History  Diagnosis Date  . HTN (hypertension)   . History of stroke   . History of TIA (transient ischemic attack)   . Hyperlipidemia   . Malignant neoplasm of prostate ([place])   . Essential and other specified forms of tremor   . Supraventricular premature beats   . PAF (paroxysmal atrial fibrillation) ([place])   . External hemorrhoids without mention of complication   . Unspecified anomaly of tooth position   . Sebaceous cyst   . Lumbago   . Nontraumatic rupture of tendons of biceps (long head)   . History of ST elevation myocardial infarction (STEMI)     a. Ant-Lat STEMI >> DES to D2 subbranch  . CAD (coronary artery disease)     a. STEMI 6/17: LHC - oLAD 10, mLAD 50, dLAD 20, D2 inf subbranch 70 + sup subbranch 100, pRCA 20, oRPDA 40, EF 35-45% >> PCI:  2.25 x 12 mm resolute integrity DES to the superior subbranch of D2   . Paroxysmal atrial fibrillation (HCC)     during admit for STEMI 6/17 >> triple Rx high risk >> ASA + Plavix x 30 days post MI, then Eliquis + Plavix  . Ischemic cardiomyopathy     a. Echo 6/17: Mid and distal anterior/inferior, septal and apical HK, mild concentric LVH, EF 30-35%, mild LAE    Past Surgical  History  Procedure Laterality Date  . Prostate cancer    . Back surgery      with sciatica  . Appendectomy  1942  . Spine surgery N/A 2000    [place]  . Cardiac catheterization N/A 11/19/2015    Procedure: Left Heart Cath and Coronary Angiography;  Surgeon: Burnell Blanks, MD;  Location: [place] [facility];  Service: Cardiovascular;  Laterality: N/A;  . Cardiac catheterization N/A 11/19/2015    Procedure: Coronary Stent Intervention;  Surgeon: Burnell Blanks, MD;  Location: [place] [facility];  Service: Cardiovascular;  Laterality: N/A;    There were no vitals filed for this visit.      Subjective Assessment - 12/20/15 1457    Subjective Pt walked 15 min and felt fine. The day before pt walked 1/2 mile and felt it was to much.   Patient is accompained by: Family member   Pertinent History HTN, CVA, TIA, HLD, prostate CA, idiopathic PE, back sx in 2000   Limitations Standing;Walking;Lifting   Patient Stated Goals To improve balance and walking   Currently in Pain? No/denies                         [facility] Adult PT Treatment/Exercise - 12/20/15 0001  Ambulation/Gait   Ambulation/Gait Yes   Ambulation/Gait Assistance 4: Min guard;4: Min assist   Ambulation/Gait Assistance Details Working on posture and balance with dynamic gait, changes in speed direction and visual scanning technique   Ambulation Distance (Feet) 400 Feet   Assistive device None   Gait Pattern Step-through pattern;Decreased stride length;Shuffle;Trunk flexed   Ambulation Surface Level   Ramp 4: Min assist   Ramp Details (indicate cue type and reason) cues for posture   Curb 4: Min assist  LOB x1   Curb Details (indicate cue type and reason) cues for step through pattern             Balance Exercises - 12/20/15 1520    OTAGO PROGRAM   Walking and Turning Around Assistive device  with visual scanning task.   Tandem Stance 10 seconds, support  30seconds 2x each foot in  front   One Leg Stand 10 seconds, support           PT Education - 12/20/15 1553    Education provided Yes   Education Details technique to progress tandem and SLS practice.   Person(s) Educated Patient;Spouse   Methods Explanation;Demonstration;Verbal cues   Comprehension Verbalized understanding;Returned demonstration;Verbal cues required          PT Short Term Goals - 12/03/15 1700    PT SHORT TERM GOAL #1   Title Pt will be able to ambulate 300' around obstales with supervision (Target Date: 12/28/2015)   Time 4   Period Weeks   Status On-going   PT SHORT TERM GOAL #2   Title Patient ambulates with head turns to scan environment and is able to maintain path with supervision. (Target Date: 12/28/2015)   Time 4   Period Weeks   Status On-going   PT SHORT TERM GOAL #3   Title Patient demonstrates understanding of initial HEP. (Target Date: 12/28/2015)   Time 4   Period Weeks   Status On-going   PT SHORT TERM GOAL #4   Title Patient reaches 10" anteriorly & to floor without UE support safely. (Target Date: 12/28/2015)   Time 4   Period Weeks   Status On-going           PT Long Term Goals - 12/03/15 1700    PT LONG TERM GOAL #1   Title Pt will be independent with HEP for strengthening and balance to maximaze functoinal gains  (Target Date: 01/25/2016)   Time 8   Period Weeks   Status On-going   PT LONG TERM GOAL #2   Title Pt will be able to ambulate >1000' including grass, ramps, curbs and stairs (1 rail) without device modifed independent to indicate community level mobilty.  (Target Date: 01/25/2016)   Time 8   Period Weeks   Status On-going   PT LONG TERM GOAL #3   Title Pt will be able to improve Berg Balance Score from 22/56 to >36/56 to indicate improve balance (Target Date: 01/25/2016)   Time 8   Period Weeks   Status On-going   PT LONG TERM GOAL #4   Title Pt will be able to improve Dynamic Gait Index from 7/24 to >/= 14/30 to indicate decreased  risk for falls (Target Date: 01/25/2016)   Time 8   Period Weeks   Status On-going   PT LONG TERM GOAL #5   Title Pt will be able to improve Timed Up-Go from 15.60s to < 13.5sec to indicate decreased risk for falls (Target Date: 01/25/2016)  Time 8   Period Weeks   Status On-going               Plan - 12/20/15 1555    Clinical Impression Statement Pt continues to require supervision to Min guard with dynamic gait in a distracting environment demonstrating intermittent imbalances and poor posture control.   Rehab Potential Good   Clinical Impairments Affecting Rehab Potential Patient comorbitiies with limitations from back pain   PT Frequency 2x / week   PT Duration 8 weeks   PT Treatment/Interventions ADLs/Self Care Home Management;Therapeutic exercise;Therapeutic activities;Functional mobility training;Stair training;Gait training;DME Instruction;Balance training;Neuromuscular re-education;Patient/family education;Orthotic Fit/Training;Passive range of motion   PT Next Visit Plan continue to work on balance, strengthening and dynamic gait toward goals.   Consulted and Agree with Plan of Care Patient;Family member/caregiver   Family Member Consulted wife      Patient will benefit from skilled therapeutic intervention in order to improve the following deficits and impairments:  Abnormal gait, Decreased activity tolerance, Cardiopulmonary status limiting activity, Decreased balance, Decreased strength, Postural dysfunction, Improper body mechanics, Decreased endurance, Decreased knowledge of use of DME, Difficulty walking  Visit Diagnosis: Other abnormalities of gait and mobility  Unsteadiness on feet  Difficulty in walking, not elsewhere classified  Midline low back pain without sciatica  History of falling     Problem List Patient Active Problem List   Diagnosis Date Noted  . CAD (coronary artery disease) 12/03/2015  . Atrial fibrillation ([place]) 11/21/2015  .  STEMI (ST elevation myocardial infarction) ([place]) 11/19/2015  . Acute ST elevation myocardial infarction (STEMI) of anterolateral wall ([place])   . Pruritus 01/02/2015  . History of CVA (cerebrovascular accident) 05/31/2014  . Pulmonary embolism ([place]) 05/20/2014  . HTN (hypertension) 05/20/2014  . Unspecified late effects of cerebrovascular disease 05/20/2014  . Late effects of cerebrovascular disease   . Prostate cancer ([place]) 04/05/2014  . Spinal stenosis of lumbar region 11/15/2013  . Falls 05/10/2013  . Hyperlipidemia   . Benign essential tremor   . Supraventricular premature beats   . Lumbago   . Palpitations   . Ingrown toenail without infection 09/07/2012    Bjorn Loser, PTA  12/20/2015, 3:59 PM [facility] [address] [place], [place], [address] Phone: [phone]   Fax:  [phone]  Name: Henry Franklin. MRN: [id number] Date of Birth: Jun 20, 1926

## 2015-12-21 ENCOUNTER — Other Ambulatory Visit: Payer: Self-pay

## 2015-12-21 NOTE — Patient Outreach (Signed)
[facility]) Care Management  12/21/2015  Henry Franklin. 1926-06-11 [id number]  Returned patients call from voice mail received on 12/20/15.  Unable to reach patient. HIPAA compliant voice message left with call back phone number.    PLAN; RNCM will attempt 2nd telephone call within 1 week.  Quinn Plowman RN,BSN,CCM [facility] Telephonic  [phone]

## 2015-12-24 ENCOUNTER — Ambulatory Visit: Payer: PPO | Admitting: Physical Therapy

## 2015-12-24 ENCOUNTER — Other Ambulatory Visit: Payer: Self-pay

## 2015-12-24 DIAGNOSIS — R2689 Other abnormalities of gait and mobility: Secondary | ICD-10-CM | POA: Diagnosis not present

## 2015-12-24 DIAGNOSIS — R2681 Unsteadiness on feet: Secondary | ICD-10-CM

## 2015-12-24 DIAGNOSIS — M545 Low back pain, unspecified: Secondary | ICD-10-CM

## 2015-12-24 DIAGNOSIS — R262 Difficulty in walking, not elsewhere classified: Secondary | ICD-10-CM

## 2015-12-24 DIAGNOSIS — Z9181 History of falling: Secondary | ICD-10-CM

## 2015-12-24 NOTE — Patient Instructions (Signed)
Low-Sodium Eating Plan °Sodium raises blood pressure and causes water to be held in the body. Getting less sodium from food will help lower your blood pressure, reduce any swelling, and protect your heart, liver, and kidneys. We get sodium by adding salt (sodium chloride) to food. Most of our sodium comes from canned, boxed, and frozen foods. Restaurant foods, fast foods, and pizza are also very high in sodium. Even if you take medicine to lower your blood pressure or to reduce fluid in your body, getting less sodium from your food is important. °WHAT IS MY PLAN? °Most people should limit their sodium intake to 2,300 mg a day. Your health care provider recommends that you limit your sodium intake to __________ a day.  °WHAT DO I NEED TO KNOW ABOUT THIS EATING PLAN? °For the low-sodium eating plan, you will follow these general guidelines: °· Choose foods with a % Daily Value for sodium of less than 5% (as listed on the food label).   °· Use salt-free seasonings or herbs instead of table salt or sea salt.   °· Check with your health care provider or pharmacist before using salt substitutes.   °· Eat fresh foods. °· Eat more vegetables and fruits. °· Limit canned vegetables. If you do use them, rinse them well to decrease the sodium.   °· Limit cheese to 1 oz (28 g) per day.    °· Eat lower-sodium products, often labeled as "lower sodium" or "no salt added." °· Avoid foods that contain monosodium glutamate (MSG). MSG is sometimes added to Chinese food and some canned foods.   °· Check food labels (Nutrition Facts labels) on foods to learn how much sodium is in one serving. °· Eat more home-cooked food and less restaurant, buffet, and fast food.  °· When eating at a restaurant, ask that your food be prepared with less salt, or no salt if possible.   °HOW DO I READ FOOD LABELS FOR SODIUM INFORMATION? °The Nutrition Facts label lists the amount of sodium in one serving of the food. If you eat more than one serving, you  must multiply the listed amount of sodium by the number of servings. °Food labels may also identify foods as: °· Sodium free--Less than 5 mg in a serving. °· Very low sodium--35 mg or less in a serving. °· Low sodium--140 mg or less in a serving. °· Light in sodium--50% less sodium in a serving. For example, if a food that usually has 300 mg of sodium is changed to become light in sodium, it will have 150 mg of sodium. °· Reduced sodium--25% less sodium in a serving. For example, if a food that usually has 400 mg of sodium is changed to reduced sodium, it will have 300 mg of sodium. °WHAT FOODS CAN I EAT? °Grains  °Low-sodium cereals, including oats, puffed wheat and rice, and shredded wheat cereals. Low-sodium crackers. Unsalted rice and pasta. Lower-sodium bread.  °Vegetables  °Frozen or fresh vegetables. Low-sodium or reduced-sodium canned vegetables. Low-sodium or reduced-sodium tomato sauce and paste. Low-sodium or reduced-sodium tomato and vegetable juices.  °Fruits  °Fresh, frozen, and canned fruit. Fruit juice.  °Meat and Other Protein Products  °Low-sodium canned tuna and salmon. Fresh or frozen meat, poultry, seafood, and fish. Lamb. Unsalted nuts. Dried beans, peas, and lentils without added salt. Unsalted canned beans. Homemade soups without salt. Eggs.  °Dairy  °Milk. Soy milk. Ricotta cheese. Low-sodium or reduced-sodium cheeses. Yogurt.  °Condiments  °Fresh and dried herbs and spices. Salt-free seasonings. Onion and garlic powders. Low-sodium varieties of mustard and ketchup. Fresh or refrigerated horseradish. Lemon   juice.  °Fats and Oils   °Reduced-sodium salad dressings. Unsalted butter.   °Other  °Unsalted popcorn and pretzels.  °The items listed above may not be a complete list of recommended foods or beverages. Contact your dietitian for more options. °WHAT FOODS ARE NOT RECOMMENDED? °Grains  °Instant hot cereals. Bread stuffing, pancake, and biscuit mixes. Croutons. Seasoned rice or pasta mixes.  Noodle soup cups. Boxed or frozen macaroni and cheese. Self-rising flour. Regular salted crackers. °Vegetables  °Regular canned vegetables. Regular canned tomato sauce and paste. Regular tomato and vegetable juices. Frozen vegetables in sauces. Salted French fries. Olives. Pickles. Relishes. Sauerkraut. Salsa. °Meat and Other Protein Products  °Salted, canned, smoked, spiced, or pickled meats, seafood, or fish. Bacon, ham, sausage, hot dogs, corned beef, chipped beef, and packaged luncheon meats. Salt pork. Jerky. Pickled herring. Anchovies, regular canned tuna, and sardines. Salted nuts. °Dairy  °Processed cheese and cheese spreads. Cheese curds. Blue cheese and cottage cheese. Buttermilk.  °Condiments  °Onion and garlic salt, seasoned salt, table salt, and sea salt. Canned and packaged gravies. Worcestershire sauce. Tartar sauce. Barbecue sauce. Teriyaki sauce. Soy sauce, including reduced sodium. Steak sauce. Fish sauce. Oyster sauce. Cocktail sauce. Horseradish that you find on the shelf. Regular ketchup and mustard. Meat flavorings and tenderizers. Bouillon cubes. Hot sauce. Tabasco sauce. Marinades. Taco seasonings. Relishes. °Fats and Oils   °Regular salad dressings. Salted butter. Margarine. Ghee. Bacon fat.  °Other  °Potato and tortilla chips. Corn chips and puffs. Salted popcorn and pretzels. Canned or dried soups. Pizza. Frozen entrees and pot pies.   °The items listed above may not be a complete list of foods and beverages to avoid. Contact your dietitian for more information. °  °This information is not intended to replace advice given to you by your health care provider. Make sure you discuss any questions you have with your health care provider. °  °Document Released: 11/08/2001 Document Revised: 06/09/2014 Document Reviewed: 03/23/2013 °Elsevier Interactive Patient Education ©2016 Elsevier Inc. ° °

## 2015-12-24 NOTE — Therapy (Signed)
[facility] [address] [place], [place], [address] Phone: [phone]   Fax:  [phone]  Physical Therapy Treatment  Patient Details  Name: Henry Franklin. MRN: [id number] Date of Birth: 01-07-1927 Referring Provider: [name] MD  Encounter Date: 12/24/2015      PT End of Session - 12/24/15 1319    Visit Number 8   Number of Visits 17   Date for PT Re-Evaluation 01/25/16   Authorization Type Medicare- G codes needed   PT Start Time [id number]   PT Stop Time 1318   PT Time Calculation (min) 43 min   Equipment Utilized During Treatment Gait belt   Activity Tolerance Patient tolerated treatment well   Behavior During Therapy WFL for tasks assessed/performed      Past Medical History:  Diagnosis Date  . CAD (coronary artery disease)    a. STEMI 6/17: LHC - oLAD 10, mLAD 50, dLAD 20, D2 inf subbranch 70 + sup subbranch 100, pRCA 20, oRPDA 40, EF 35-45% >> PCI:  2.25 x 12 mm resolute integrity DES to the superior subbranch of D2   . Essential and other specified forms of tremor   . External hemorrhoids without mention of complication   . History of ST elevation myocardial infarction (STEMI)    a. Ant-Lat STEMI >> DES to D2 subbranch  . History of stroke   . History of TIA (transient ischemic attack)   . HTN (hypertension)   . Hyperlipidemia   . Ischemic cardiomyopathy    a. Echo 6/17: Mid and distal anterior/inferior, septal and apical HK, mild concentric LVH, EF 30-35%, mild LAE  . Lumbago   . Malignant neoplasm of prostate ([place])   . Nontraumatic rupture of tendons of biceps (long head)   . PAF (paroxysmal atrial fibrillation) ([place])   . Paroxysmal atrial fibrillation (HCC)    during admit for STEMI 6/17 >> triple Rx high risk >> ASA + Plavix x 30 days post MI, then Eliquis + Plavix  . Sebaceous cyst   . Supraventricular premature beats   . Unspecified anomaly of tooth position     Past Surgical  History:  Procedure Laterality Date  . APPENDECTOMY  1942  . BACK SURGERY     with sciatica  . CARDIAC CATHETERIZATION N/A 11/19/2015   Procedure: Left Heart Cath and Coronary Angiography;  Surgeon: Burnell Blanks, MD;  Location: [place] [facility];  Service: Cardiovascular;  Laterality: N/A;  . CARDIAC CATHETERIZATION N/A 11/19/2015   Procedure: Coronary Stent Intervention;  Surgeon: Burnell Blanks, MD;  Location: [facility];  Service: Cardiovascular;  Laterality: N/A;  . prostate cancer    . SPINE SURGERY N/A 2000   [place]    There were no vitals filed for this visit.      Subjective Assessment - 12/24/15 1236    Subjective Worked on balance exercise from last visit.   Patient is accompained by: Family member   Pertinent History HTN, CVA, TIA, HLD, prostate CA, idiopathic PE, back sx in 2000   Limitations Standing;Walking;Lifting   Patient Stated Goals To improve balance and walking   Currently in Pain? No/denies                              Balance Exercises - 12/24/15 1236      Balance Exercises: Standing   Standing Eyes Opened Wide (BOA)  Multilevel reaching outside, weight shifting  in all directions   Gait with Head Turns Forward  turns and diagonal; supervision level.   Retro Gait Other (comment)  intermittent UE support.   Turning Both  LEarning to    Marching Limitations marching forward with intermittent UE support           PT Education - 12/24/15 1523    Education provided Yes   Education Details HEP to practice dynamic gait at home ( head turns, diagonals, marching and walking  backwards).  Technique for balance strategies for transitions during mobility.   Person(s) Educated Patient;Spouse   Methods Explanation;Demonstration;Verbal cues;Handout   Comprehension Verbalized understanding;Returned demonstration;Verbal cues required;Need further instruction          PT Short Term Goals - 12/03/15 1700       PT SHORT TERM GOAL #1   Title Pt will be able to ambulate 300' around obstales with supervision (Target Date: 12/28/2015)   Time 4   Period Weeks   Status On-going     PT SHORT TERM GOAL #2   Title Patient ambulates with head turns to scan environment and is able to maintain path with supervision. (Target Date: 12/28/2015)   Time 4   Period Weeks   Status On-going     PT SHORT TERM GOAL #3   Title Patient demonstrates understanding of initial HEP. (Target Date: 12/28/2015)   Time 4   Period Weeks   Status On-going     PT SHORT TERM GOAL #4   Title Patient reaches 10" anteriorly & to floor without UE support safely. (Target Date: 12/28/2015)   Time 4   Period Weeks   Status On-going           PT Long Term Goals - 12/03/15 1700      PT LONG TERM GOAL #1   Title Pt will be independent with HEP for strengthening and balance to maximaze functoinal gains  (Target Date: 01/25/2016)   Time 8   Period Weeks   Status On-going     PT LONG TERM GOAL #2   Title Pt will be able to ambulate >1000' including grass, ramps, curbs and stairs (1 rail) without device modifed independent to indicate community level mobilty.  (Target Date: 01/25/2016)   Time 8   Period Weeks   Status On-going     PT LONG TERM GOAL #3   Title Pt will be able to improve Berg Balance Score from 22/56 to >36/56 to indicate improve balance (Target Date: 01/25/2016)   Time 8   Period Weeks   Status On-going     PT LONG TERM GOAL #4   Title Pt will be able to improve Dynamic Gait Index from 7/24 to >/= 14/30 to indicate decreased risk for falls (Target Date: 01/25/2016)   Time 8   Period Weeks   Status On-going     PT LONG TERM GOAL #5   Title Pt will be able to improve Timed Up-Go from 15.60s to < 13.5sec to indicate decreased risk for falls (Target Date: 01/25/2016)   Time 8   Period Weeks   Status On-going               Plan - 12/24/15 1526    Clinical Impression Statement  Pt progressed  with balance and safety strategies during dynamic gait and standing balance reaching outside BOS.   Rehab Potential Good   Clinical Impairments Affecting Rehab Potential Patient comorbitiies with limitations from back pain   PT Frequency 2x /  week   PT Duration 8 weeks   PT Treatment/Interventions ADLs/Self Care Home Management;Therapeutic exercise;Therapeutic activities;Functional mobility training;Stair training;Gait training;DME Instruction;Balance training;Neuromuscular re-education;Patient/family education;Orthotic Fit/Training;Passive range of motion   PT Next Visit Plan continue to work on balance, strengthening and dynamic gait toward goals.   Consulted and Agree with Plan of Care Patient;Family member/caregiver   Family Member Consulted wife      Patient will benefit from skilled therapeutic intervention in order to improve the following deficits and impairments:  Abnormal gait, Decreased activity tolerance, Cardiopulmonary status limiting activity, Decreased balance, Decreased strength, Postural dysfunction, Improper body mechanics, Decreased endurance, Decreased knowledge of use of DME, Difficulty walking  Visit Diagnosis: Other abnormalities of gait and mobility  Unsteadiness on feet  Difficulty in walking, not elsewhere classified  Midline low back pain without sciatica  History of falling     Problem List Patient Active Problem List   Diagnosis Date Noted  . CAD (coronary artery disease) 12/03/2015  . Atrial fibrillation ([place]) 11/21/2015  . STEMI (ST elevation myocardial infarction) ([place]) 11/19/2015  . Acute ST elevation myocardial infarction (STEMI) of anterolateral wall ([place])   . Pruritus 01/02/2015  . History of CVA (cerebrovascular accident) 05/31/2014  . Pulmonary embolism ([place]) 05/20/2014  . HTN (hypertension) 05/20/2014  . Unspecified late effects of cerebrovascular disease 05/20/2014  . Late effects of cerebrovascular disease   . Prostate cancer ([place])  04/05/2014  . Spinal stenosis of lumbar region 11/15/2013  . Falls 05/10/2013  . Hyperlipidemia   . Benign essential tremor   . Supraventricular premature beats   . Lumbago   . Palpitations   . Ingrown toenail without infection 09/07/2012    Bjorn Loser, PTA  12/24/15, 3:30 PM [facility] [address] [place], [place], [address] Phone: [phone]   Fax:  [phone]  Name: Henry Franklin. MRN: [id number] Date of Birth: [birth year]

## 2015-12-24 NOTE — Patient Outreach (Signed)
[facility]) Care Management  12/24/2015  Henry Franklin [birth year] [id number]   REFERRAL SOURCE: [facility] referral for health team advantage member REFERRAL REASON:  Recent discharge from hospital due to Myocardial infarction and on new medications.  Patient could benefit from ongoing health education related to new medications and new diagnosis of Myocardial infarction  CONSENT:  Self  Second telephone call to patient regarding follow up telephone assessment. Unable to reach patient.  HIPAA compliant voice message left with call back phone number.   PLAN; RNCM will attempt 3rd telephone call to patient within 2 weeks.  Quinn Plowman RN,BSN,CCM [facility] Telephonic  [phone]

## 2015-12-25 ENCOUNTER — Ambulatory Visit (HOSPITAL_COMMUNITY): Payer: PPO

## 2015-12-25 ENCOUNTER — Ambulatory Visit: Payer: Self-pay

## 2015-12-26 ENCOUNTER — Ambulatory Visit: Payer: PPO | Admitting: Physical Therapy

## 2015-12-26 ENCOUNTER — Encounter: Payer: Self-pay | Admitting: Physical Therapy

## 2015-12-26 DIAGNOSIS — R2681 Unsteadiness on feet: Secondary | ICD-10-CM

## 2015-12-26 DIAGNOSIS — R2689 Other abnormalities of gait and mobility: Secondary | ICD-10-CM

## 2015-12-26 DIAGNOSIS — R262 Difficulty in walking, not elsewhere classified: Secondary | ICD-10-CM

## 2015-12-26 DIAGNOSIS — Z9181 History of falling: Secondary | ICD-10-CM

## 2015-12-26 NOTE — Therapy (Signed)
[facility] [address] [place], [place], [address] Phone: [phone]   Fax:  [phone]  Physical Therapy Treatment  Patient Details  Name: Henry Franklin. MRN: [id number] Date of Birth: [birth year] Referring Provider: [name] MD  Encounter Date: 12/26/2015      PT End of Session - 12/26/15 2231    Visit Number 9   Number of Visits 17   Date for PT Re-Evaluation 01/25/16   Authorization Type Medicare- G codes needed   Equipment Utilized During Treatment Gait belt   Activity Tolerance Patient tolerated treatment well   Behavior During Therapy [facility] for tasks assessed/performed      Past Medical History:  Diagnosis Date  . CAD (coronary artery disease)    a. STEMI 6/17: LHC - oLAD 10, mLAD 50, dLAD 20, D2 inf subbranch 70 + sup subbranch 100, pRCA 20, oRPDA 40, EF 35-45% >> PCI:  2.25 x 12 mm resolute integrity DES to the superior subbranch of D2   . Essential and other specified forms of tremor   . External hemorrhoids without mention of complication   . History of ST elevation myocardial infarction (STEMI)    a. Ant-Lat STEMI >> DES to D2 subbranch  . History of stroke   . History of TIA (transient ischemic attack)   . HTN (hypertension)   . Hyperlipidemia   . Ischemic cardiomyopathy    a. Echo 6/17: Mid and distal anterior/inferior, septal and apical HK, mild concentric LVH, EF 30-35%, mild LAE  . Lumbago   . Malignant neoplasm of prostate ([place])   . Nontraumatic rupture of tendons of biceps (long head)   . PAF (paroxysmal atrial fibrillation) ([place])   . Paroxysmal atrial fibrillation (HCC)    during admit for STEMI 6/17 >> triple Rx high risk >> ASA + Plavix x 30 days post MI, then Eliquis + Plavix  . Sebaceous cyst   . Supraventricular premature beats   . Unspecified anomaly of tooth position     Past Surgical History:  Procedure Laterality Date  . APPENDECTOMY  1942  . BACK SURGERY     with sciatica  . CARDIAC CATHETERIZATION N/A 11/19/2015   Procedure: Left Heart Cath and Coronary Angiography;  Surgeon: Burnell Blanks, MD;  Location: [facility];  Service: Cardiovascular;  Laterality: N/A;  . CARDIAC CATHETERIZATION N/A 11/19/2015   Procedure: Coronary Stent Intervention;  Surgeon: Burnell Blanks, MD;  Location: [facility];  Service: Cardiovascular;  Laterality: N/A;  . prostate cancer    . SPINE SURGERY N/A 2000   [place]    There were no vitals filed for this visit.      Subjective Assessment - 12/26/15 1328    Subjective He feels his balance is better. He wants to transition to Cardiac Rehab starting next week.    Patient is accompained by: Family member   Pertinent History HTN, CVA, TIA, HLD, prostate CA, idiopathic PE, back sx in 2000   Limitations Standing;Walking;Lifting   Patient Stated Goals To improve balance and walking   Currently in Pain? No/denies            [facility] PT Assessment - 12/26/15 1315      Berg Balance Test   Sit to Stand Able to stand without using hands and stabilize independently   Standing Unsupported Able to stand safely 2 minutes   Sitting with Back Unsupported but Feet Supported on Floor or Stool Able to sit safely  and securely 2 minutes   Stand to Sit Sits safely with minimal use of hands   Transfers Able to transfer safely, minor use of hands   Standing Unsupported with Eyes Closed Able to stand 10 seconds with supervision   Standing Ubsupported with Feet Together Needs help to attain position but able to stand for 30 seconds with feet together   From Standing, Reach Forward with Outstretched Arm Can reach forward >5 cm safely (2")   From Standing Position, Pick up Object from Floor Able to pick up shoe, needs supervision   From Standing Position, Turn to Look Behind Over each Shoulder Turn sideways only but maintains balance   Turn 360 Degrees Needs close supervision or verbal cueing   Standing  Unsupported, Alternately Place Feet on Step/Stool Needs assistance to keep from falling or unable to try   Standing Unsupported, One Foot in Front Needs help to step but can hold 15 seconds   Standing on One Leg Unable to try or needs assist to prevent fall   Total Score 33   Berg comment: Initial was 22/56     Dynamic Gait Index   Level Surface Mild Impairment   Change in Gait Speed Moderate Impairment   Gait with Horizontal Head Turns Moderate Impairment   Gait with Vertical Head Turns Moderate Impairment   Gait and Pivot Turn Moderate Impairment   Step Over Obstacle Mild Impairment   Step Around Obstacles Mild Impairment   Steps Mild Impairment   Total Score 12     Timed Up and Go Test   Normal TUG (seconds) 13.25  initial was 15.6sec                     [facility] Adult PT Treatment/Exercise - 12/26/15 1315      Ambulation/Gait   Ambulation/Gait Yes   Ambulation/Gait Assistance 5: Supervision   Ambulation/Gait Assistance Details negotiating around and over obstacles 2-4" height.    Ambulation Distance (Feet) 300 Feet   Assistive device None   Gait Pattern Step-through pattern;Decreased stride length;Shuffle;Trunk flexed   Ambulation Surface Indoor;Level   Stairs Yes   Stairs Assistance 5: Supervision   Stair Management Technique One rail Right;Forwards;Alternating pattern   Number of Stairs 4                PT Education - 12/26/15 1400    Education provided Yes   Education Details 54 mintue conversation why he needs more PT before safe to function at Cardiac Rehab without one on one supervision, risk of hip fracture with falls. Recommendation to perform [place] daily with benefits to HEP and using PT for more challenging activities.    Person(s) Educated Patient;Spouse   Methods Explanation;Verbal cues   Comprehension Verbalized understanding          PT Short Term Goals - 12/26/15 2223      PT SHORT TERM GOAL #1   Title Pt will be able to  ambulate 300' around obstales with supervision (Target Date: 12/28/2015)   Baseline MET 12/26/2015    Time 4   Period Weeks   Status Achieved     PT SHORT TERM GOAL #2   Title Patient ambulates with head turns to scan environment and is able to maintain path with supervision. (Target Date: 12/28/2015)   Baseline MET 12/26/2015   Time 4   Period Weeks   Status Achieved     PT SHORT TERM GOAL #3   Title Patient demonstrates understanding of  initial HEP. (Target Date: 12/28/2015)   Baseline MET 12/26/2015   Time 4   Period Weeks   Status Achieved     PT SHORT TERM GOAL #4   Title Patient reaches 10" anteriorly & to floor without UE support safely. (Target Date: 12/28/2015)   Baseline MET 12/26/2015   Time 4   Period Weeks   Status Achieved           PT Long Term Goals - 12/03/15 1700      PT LONG TERM GOAL #1   Title Pt will be independent with HEP for strengthening and balance to maximaze functoinal gains  (Target Date: 01/25/2016)   Time 8   Period Weeks   Status On-going     PT LONG TERM GOAL #2   Title Pt will be able to ambulate >1000' including grass, ramps, curbs and stairs (1 rail) without device modifed independent to indicate community level mobilty.  (Target Date: 01/25/2016)   Time 8   Period Weeks   Status On-going     PT LONG TERM GOAL #3   Title Pt will be able to improve Berg Balance Score from 22/56 to >36/56 to indicate improve balance (Target Date: 01/25/2016)   Time 8   Period Weeks   Status On-going     PT LONG TERM GOAL #4   Title Pt will be able to improve Dynamic Gait Index from 7/24 to >/= 14/30 to indicate decreased risk for falls (Target Date: 01/25/2016)   Time 8   Period Weeks   Status On-going     PT LONG TERM GOAL #5   Title Pt will be able to improve Timed Up-Go from 15.60s to < 13.5sec to indicate decreased risk for falls (Target Date: 01/25/2016)   Time 8   Period Weeks   Status On-going               Plan - 12/26/15  2234    Clinical Impression Statement Patient met STGs but needs additional skilled PT to meet LTGs and safely participate in Cardiac Rehab where one on one is not provided.    Rehab Potential Good   Clinical Impairments Affecting Rehab Potential Patient comorbitiies with limitations from back pain   PT Frequency 2x / week   PT Duration 8 weeks   PT Treatment/Interventions ADLs/Self Care Home Management;Therapeutic exercise;Therapeutic activities;Functional mobility training;Stair training;Gait training;DME Instruction;Balance training;Neuromuscular re-education;Patient/family education;Orthotic Fit/Training;Passive range of motion   PT Next Visit Plan Do G-Code, continue to work on balance, strengthening and dynamic gait toward goals.   Consulted and Agree with Plan of Care Patient;Family member/caregiver   Family Member Consulted wife      Patient will benefit from skilled therapeutic intervention in order to improve the following deficits and impairments:  Abnormal gait, Decreased activity tolerance, Cardiopulmonary status limiting activity, Decreased balance, Decreased strength, Postural dysfunction, Improper body mechanics, Decreased endurance, Decreased knowledge of use of DME, Difficulty walking  Visit Diagnosis: Other abnormalities of gait and mobility  Unsteadiness on feet  Difficulty in walking, not elsewhere classified  History of falling     Problem List Patient Active Problem List   Diagnosis Date Noted  . CAD (coronary artery disease) 12/03/2015  . Atrial fibrillation ([place]) 11/21/2015  . STEMI (ST elevation myocardial infarction) ([place]) 11/19/2015  . Acute ST elevation myocardial infarction (STEMI) of anterolateral wall ([place])   . Pruritus 01/02/2015  . History of CVA (cerebrovascular accident) 05/31/2014  . Pulmonary embolism ([place]) 05/20/2014  . HTN (hypertension)  05/20/2014  . Unspecified late effects of cerebrovascular disease 05/20/2014  . Late effects of  cerebrovascular disease   . Prostate cancer ([place]) 04/05/2014  . Spinal stenosis of lumbar region 11/15/2013  . Falls 05/10/2013  . Hyperlipidemia   . Benign essential tremor   . Supraventricular premature beats   . Lumbago   . Palpitations   . Ingrown toenail without infection 09/07/2012    Jahmar Mckelvy PT, DPT 12/26/2015, 10:38 PM  [facility] [address] [place], [place], [address] Phone: [phone]   Fax:  [phone]  Name: Henry Franklin. MRN: [id number] Date of Birth: May 28, 1927

## 2015-12-28 ENCOUNTER — Ambulatory Visit: Payer: Self-pay

## 2015-12-31 ENCOUNTER — Ambulatory Visit: Payer: PPO | Admitting: Physical Therapy

## 2016-01-01 ENCOUNTER — Ambulatory Visit: Payer: Self-pay

## 2016-01-01 ENCOUNTER — Inpatient Hospital Stay (HOSPITAL_COMMUNITY): Admission: RE | Admit: 2016-01-01 | Payer: PPO | Source: Ambulatory Visit

## 2016-01-02 ENCOUNTER — Telehealth (HOSPITAL_COMMUNITY): Payer: Self-pay | Admitting: Cardiac Rehabilitation

## 2016-01-02 ENCOUNTER — Other Ambulatory Visit: Payer: Self-pay

## 2016-01-02 NOTE — Telephone Encounter (Signed)
-----   Message from Isaias Cowman, PT sent at 01/02/2016 11:54 AM EDT ----- Regarding: RE: cardiac rehab  I spoke to [place] last week. We are planning to continue BOOST program with discharge by 8/25. Verdis Frederickson was scheduling him for cardiac assessment on 8/22 I think with starting actual program the following week when he is done with PT. Jamey Reas, PT, DPT  ----- Message ----- From: Lowell Guitar, RN Sent: 01/01/2016   2:10 PM To: Isaias Cowman, PT Subject: cardiac rehab                                  Dear Shirlean Mylar,  Pt is very eager to begin cardiac rehab.  He is aware of need to complete BOOST first.  Please let me know when it is appropriate for him to transition to cardiac rehab so we can get him scheduled for orientation.  Our next available orientation date is 8/22.  Would that give him enough time to complete BOOST?  Thank you, Andi Hence, RN, BSN Cardiac Pulmonary Rehab

## 2016-01-02 NOTE — Patient Outreach (Addendum)
[name]) Care Management  [facility] Care Manager  01/02/2016   Henry Franklin 22-Mar-1977 [id number]  REFERRAL SOURCE;  Health team advantage/ silverback referral REFERRAL REASON:  Recent discharge from hospital for Myocardial infarction. On multiple medications, Currently in outpatient rehabilitation with plans to start cardiac rehab after completion of physical therapy. Patient could benefit from ongoing health education related to new medications and new diagnosis of myocardial infarction   Subjective: Telephone call to patient regarding referral. HIPAA verified with patient.  Patient states he feels like he is doing ok.  Patient reports he has an appointment scheduled with his new primary MD, Dr [name] around the 1st part of October 2017.  Patient states he has a follow up appointment with Richardson Dopp, [facility] on tomorrow.  Patient states he continues to try to adhere to his diet of low salt and low fat.  Patient states his wife is pretty strict on him when it comes to making sure he has a low salt diet. Patient states he continues to go to outpatient rehab for physical therapy 2 times per week. Patient states he is going because he has had problems with his balance over the past few years. Patient reports he has fallen 1 time within the past year. Patient denies injury  Patient states he has a cane that he uses as needed.  Patient states he feels that his chronic back problems has caused some of his issues with his balance.  Patient reports he is walking around 5 minutes per day several times per day.  Patient states he had orientation for cardiac rehab on yesterday 01/02/16. Patient states he is not sure whether he wants to go to cardiac rehab. (82)  Patient states he will talk with Richardson Dopp, PA at his appointment on tomorrow.   Patient reports he continues to take his medications as prescribed. Patient states the doctor has put him back on his Eliquis daily.  Patient  denies having any unusual symptoms.  Patient states he does not check his blood pressure at home. Patient states he has his blood pressure checked when he goes to his physical therapy appointments an at his doctor appointments. Patient states his pulse runs low between 40-60. Patient states his doctor is aware of this.  Patient states he received [facility] care management packet with consent form and EMMI education material. RNCM requested patient complete and return mail the [facility] care management consent form. Patient verbalized understanding. RNCM discussed with patient benefits of cardiac rehab and requested patient discuss this further with his doctor. RNCM discussed fall prevention with patient.  Patient reports receiving EMMI education material related to fall prevention. RNCM encouraged patient to continue his primary MD and use cane for safety/ stability as needed. RNCM advised patient to keep his doctors appointments.  RNCM advised patient to take his medications as prescribed. RNCM advised patient to take Eliquis as prescribed and advised patient to report any signs of bleeding to his doctor immediately. RNCM discussed with patient low salt food choices. Patient states he received EMMI education material on low salt diet. RNCM requested patient review EMMI education articles by next outreach Patient verbally agreed to next telephone outreach with [facility].  Objective: see Assessment  Encounter Medications:  Outpatient Encounter Prescriptions as of 01/02/2016  Medication Sig  . amiodarone (PACERONE) 200 MG tablet Take 1 tablet (200 mg total) by mouth daily.  [name] apixaban (ELIQUIS) 5 MG TABS tablet Take 1 tablet (5 mg total) by mouth  2 (two) times daily.  [name] atorvastatin (LIPITOR) 80 MG tablet Take 1 tablet (80 mg total) by mouth daily at 6 PM.  . Cholecalciferol (VITAMIN D-3) 1000 UNITS CAPS Take 1 capsule by mouth daily. Take one tablet by mouth once daily.  . clopidogrel (PLAVIX) 75 MG tablet Take 1 tablet  (75 mg total) by mouth daily with breakfast.  . lisinopril (PRINIVIL,ZESTRIL) 2.5 MG tablet Take 1 tablet (2.5 mg total) by mouth daily.  . metoprolol succinate (TOPROL-XL) 25 MG 24 hr tablet Take 0.5 tablets (12.5 mg total) by mouth daily.  . Multiple Vitamin (MULTIVITAMIN) tablet Take 1 tablet by mouth daily.  . nitroGLYCERIN (NITROSTAT) 0.4 MG SL tablet Place 1 tablet (0.4 mg total) under the tongue every 5 (five) minutes x 3 doses as needed for chest pain.   No facility-administered encounter medications on file as of 01/02/2016.     Functional Status:  In your present state of health, do you have any difficulty performing the following activities: 01/02/2016 11/19/2015  Hearing? N N  Vision? N N  Difficulty concentrating or making decisions? N N  Walking or climbing stairs? N N  Dressing or bathing? N N  Doing errands, shopping? N N  Preparing Food and eating ? N -  Using the Toilet? N -  In the past six months, have you accidently leaked urine? N -  Do you have problems with loss of bowel control? N -  Managing your Medications? Y -  Managing your Finances? N -  Housekeeping or managing your Housekeeping? Y -  Some recent data might be hidden    Fall/Depression Screening: PHQ 2/9 Scores 01/02/2016 12/18/2015 07/11/2015 05/31/2014 04/05/2014 11/10/2012 09/07/2012  PHQ - 2 Score 0 0 0 0 0 0 0   Fall Risk  01/02/2016 01/02/2016 12/18/2015 07/11/2015 01/02/2015  Falls in the past year? - Yes Yes No No  Number falls in past yr: - 1 1 - -  Injury with Fall? (No Data) No No - -  Risk for fall due to : Impaired balance/gait - - - -  Risk for fall due to (comments): patient reports he has had problems with his balance for approximately 3 years. Patient reports he is currently in outpatient physical therapy for his balance issues - - - -  Follow up Falls prevention discussed - - - -     [facility]) Care Management is working in partnership with you to provide your patient with Disease  Management, Transition of Care, Complex Care Management, and Wellness programs.           [facility] CM Care Plan Problem One   Flowsheet Row Most Recent Value  Care Plan Problem One  knowledge deficit related to low sodium diet  Role Documenting the Problem One  Care Management Telephonic Coordinator  Care Plan for Problem One  Active  [facility] CM Short Term Goal #1 (0-30 days)  Patient will verbalize 3 foods that are low in salt within 30 days.  THN CM Short Term Goal #1 Start Date  01/02/16  Interventions for Short Term Goal #1  RNCM mailed patient EMMI education material on low salt diet, RNCM discussed with patient food items that are low in sodium such as fresh meat, chicken fish (baked, fresh or frozen vegetables, if canned vegetabels rinse can vegetables well with water,  fruit  THN CM Short Term Goal #2 (0-30 days)  Patient will verbalize at least 3 foods to stay away from that are  high in salt within 30 days.  THN CM Short Term Goal #2 Start Date  01/02/16  Interventions for Short Term Goal #2  RNCM mailed patient EMMI education material on low salt diet.     [facility] CM Care Plan Problem Two   Flowsheet Row Most Recent Value  Care Plan Problem Two  fall prevention  Role Documenting the Problem Two  Care Management Telephonic Coordinator  Care Plan for Problem Two  Active  Interventions for Problem Two Long Term Goal   RNCM mailed patient EMMI education material regarding fall prevention.  RNCM discussed with patient importance of continuing physical therapy visits to assist with balance, use cane as needed for safety, and do home exercise as advised by physical therapist.   THN Long Term Goal (31-90) days  Patient will not report any falls within 60 days  THN Long Term Goal Start Date  01/02/16      Assessment: Per discharge paper from 11/22/15:  Mr. Snellen is an 82 yo male with PMH of HTN/Stroke/TIA/HLD/Prostate Ca and idiopathic PE. The estimated ejection fraction was in the range of 30% to  35%.  Acute ST elevation myocardial infarction (STEMI) of anterolateral wall (HCC)   STEMI (ST elevation myocardial infarction) ([place] Patient will benefit from ongoing health education due to recent myocardial infarction  Plan: RNCM will follow up with patient within the month of September 2017 RNCM will review low salt food choices and foods high in salt with patient at next outreach. Patient will notify RNCM if he has scheduled cardiac rehab visits at next outreach. Patient will report he has reviewed EMMI education material for low salt diet and fall prevention.  Patient will mail back signed consent form to [facility] care management office.  RNCM will send involvement letter and assessment to patients primary MD.  [facility] will send patient Advance directive packet as discussed.   Quinn Plowman RN,BSN,CCM [facility] Telephonic  [phone]

## 2016-01-02 NOTE — Progress Notes (Signed)
Cardiology Office Note:    Date:  01/03/2016   ID:  [name], DOB 1926/10/26, MRN [id number]  PCP:  [name], MD  Cardiologist:  Dr. [name]   Electrophysiologist:  n/a Pulmonology: Dr. [name]  Referring MD: [name], MD   Chief Complaint  Patient presents with  . Coronary Artery Disease    follow up    History of Present Illness:    Henry Franklin. is a 82 y.o. male with a hx of HTN, HL, prior stroke/TIA, prostate CA and idiopathic pulmonary embolism. Patient was previously treated with Eliquis. This was stopped 11/17/15 by Pulmonology.    Admitted in 6/17 with an anterolateral STEMI. LHC demonstrated an occluded superior subbranch of the second diagonal, moderate mid LAD stenosis and moderate stenosis in the inferior subbranch of the second diagonal. He underwent PCI of the superior subbranch of D2 with placement of a DES. Post MI, he developed PAF. CHADS2-VASc=7.  Patient was placed on amiodarone.  Triple Rx with ASA, Plavix, Eliquis was felt to be too risky given his advanced age.  Plan was to discontinue aspirin after 30 days. Then, start Eliquis.  Echo demonstrated reduced EF at 30-35%.   Last seen 12/03/15. Heart rate was markedly low and his beta-blocker dose was reduced as well as his amiodarone dose.  Here for FU.  Here with his wife.  He is doing well.  He is still seeing PT.  He is also set to start [facility] later this month.  Denies chest pain, syncope, dyspnea, orthopnea, PND, edema.  He denies dizziness or near syncope.  Denies any bleeding issues. He is now on Plavix and Eliquis.     Prior CV studies that were reviewed today include:    Echo 11/21/15 Mid and distal anterior/inferior, septal and apical HK, mild concentric LVH, EF 30-35%, mild LAE  LHC 11/19/15 LAD ostial 10%, mid 50%, distal 20%, D2 inferior subbranch 70 and superior subbranch 100% LCx okay RCA proximal 20%, ostial RPDA 40% EF 35-45% with apical and anterior HK PCI: 2.25  x 12 mm resolute integrity DES to the superior subbranch of D2 1. Acute anterolateral STEMI 2. Occluded superior sub-branch of the moderate caliber Diagonal branch.  3. Successful PTCA/DES x 1 Diagonal 1 4. Moderate stenosis mid LAD (appears to be non flow limiting) 5. Mild non-obstructive disease RCA 6. Mild to moderate segmental LV systolic dysfunction 7. SVT during intervention  Myoview 7/05 Normal  Past Medical History:  Diagnosis Date  . CAD (coronary artery disease)    a. STEMI 6/17: LHC - oLAD 10, mLAD 50, dLAD 20, D2 inf subbranch 70 + sup subbranch 100, pRCA 20, oRPDA 40, EF 35-45% >> PCI:  2.25 x 12 mm resolute integrity DES to the superior subbranch of D2   . Essential and other specified forms of tremor   . External hemorrhoids without mention of complication   . History of ST elevation myocardial infarction (STEMI)    a. Ant-Lat STEMI >> DES to D2 subbranch  . History of stroke   . History of TIA (transient ischemic attack)   . HTN (hypertension)   . Hyperlipidemia   . Ischemic cardiomyopathy    a. Echo 6/17: Mid and distal anterior/inferior, septal and apical HK, mild concentric LVH, EF 30-35%, mild LAE  . Lumbago   . Malignant neoplasm of prostate ([place])   . Nontraumatic rupture of tendons of biceps (long head)   . Paroxysmal atrial fibrillation (HCC)  during admit for STEMI 6/17 >> triple Rx high risk >> ASA + Plavix x 30 days post MI, then Eliquis + Plavix  . Sebaceous cyst   . Supraventricular premature beats   . Unspecified anomaly of tooth position     Past Surgical History:  Procedure Laterality Date  . APPENDECTOMY  1942  . BACK SURGERY     with sciatica  . CARDIAC CATHETERIZATION N/A 11/19/2015   Procedure: Left Heart Cath and Coronary Angiography;  Surgeon: Burnell Blanks, MD;  Location: [facility];  Service: Cardiovascular;  Laterality: N/A;  . CARDIAC CATHETERIZATION N/A 11/19/2015   Procedure: Coronary Stent Intervention;   Surgeon: Burnell Blanks, MD;  Location: [facility];  Service: Cardiovascular;  Laterality: N/A;  . prostate cancer    . SPINE SURGERY N/A 2000   [name]    Current Medications: Outpatient Medications Prior to Visit  Medication Sig Dispense Refill  . apixaban (ELIQUIS) 5 MG TABS tablet Take 1 tablet (5 mg total) by mouth 2 (two) times daily. 180 tablet 3  . atorvastatin (LIPITOR) 80 MG tablet Take 1 tablet (80 mg total) by mouth daily at 6 PM. 30 tablet 11  . Cholecalciferol (VITAMIN D-3) 1000 UNITS CAPS Take 1 capsule by mouth daily. Take one tablet by mouth once daily.    . clopidogrel (PLAVIX) 75 MG tablet Take 1 tablet (75 mg total) by mouth daily with breakfast. 30 tablet 11  . lisinopril (PRINIVIL,ZESTRIL) 2.5 MG tablet Take 1 tablet (2.5 mg total) by mouth daily. 30 tablet 11  . Multiple Vitamin (MULTIVITAMIN) tablet Take 1 tablet by mouth daily.    . nitroGLYCERIN (NITROSTAT) 0.4 MG SL tablet Place 1 tablet (0.4 mg total) under the tongue every 5 (five) minutes x 3 doses as needed for chest pain. 25 tablet 3  . amiodarone (PACERONE) 200 MG tablet Take 1 tablet (200 mg total) by mouth daily. 90 tablet 3  . metoprolol succinate (TOPROL-XL) 25 MG 24 hr tablet Take 0.5 tablets (12.5 mg total) by mouth daily. 90 tablet 3   No facility-administered medications prior to visit.       Allergies:   Review of patient's allergies indicates no known allergies.   Social History   Social History  . Marital status: Married    Spouse name: N/A  . Number of children: N/A  . Years of education: N/A   Occupational History  . retired    Social History Main Topics  . Smoking status: Never Smoker  . Smokeless tobacco: Never Used  . Alcohol use No  . Drug use: No  . Sexual activity: Not Asked   Other Topics Concern  . None   Social History Narrative  . None     Family History:  The patient's family history includes Cancer (age of onset: 15) in his father.   ROS:     Please see the history of present illness.    Review of Systems  Constitution: Positive for weight loss.   All other systems reviewed and are negative.   EKGs/Labs/Other Test Reviewed:    EKG:  EKG is  ordered today.  The ekg ordered today demonstrates sinus brady, HR 38, septal Q waves, QTc 389 ms.  Recent Labs: 11/20/2015: Magnesium 1.8 11/22/2015: Hemoglobin 13.6; Platelets 153; TSH 2.934 12/05/2015: ALT 30; BUN 20; Creat 1.30; Potassium 5.0; Sodium 141   Recent Lipid Panel    Component Value Date/Time   CHOL 91 (L) 12/05/2015 1053  CHOL 174 07/03/2014 0820   TRIG 47 12/05/2015 1053   HDL 46 12/05/2015 1053   HDL 52 07/03/2014 0820   CHOLHDL 2.0 12/05/2015 1053   VLDL 9 12/05/2015 1053   LDLCALC 36 12/05/2015 1053   LDLCALC 100 (H) 07/03/2014 0820     Physical Exam:    VS:  BP 140/80   Pulse (!) 38   Ht 5\' 10"  (1.778 m)   Wt 143 lb 12.8 oz (65.2 kg)   BMI 20.63 kg/m     Wt Readings from Last 3 Encounters:  01/03/16 143 lb 12.8 oz (65.2 kg)  01/02/16 144 lb (65.3 kg)  12/06/15 147 lb 9.6 oz (67 kg)     Physical Exam  Constitutional: He is oriented to person, place, and time. He appears well-developed and well-nourished.  HENT:  Head: Normocephalic and atraumatic.  Eyes: No scleral icterus.  Neck: Normal range of motion.  Cardiovascular: Regular rhythm, S1 normal and S2 normal.  Bradycardia present.  Exam reveals no gallop and no friction rub.   No murmur heard. Pulmonary/Chest: Effort normal and breath sounds normal. He has no wheezes. He has no rhonchi. He has no rales.  Abdominal: Soft. He exhibits no distension and no mass. There is no tenderness.  Musculoskeletal: Normal range of motion. He exhibits no edema.  Neurological: He is alert and oriented to person, place, and time.  Skin: Skin is warm and dry.  Psychiatric: He has a normal mood and affect.    ASSESSMENT:    1. Coronary artery disease involving native coronary artery of native heart  without angina pectoris   2. Bradycardia   3. Ischemic cardiomyopathy   4. Paroxysmal atrial fibrillation (HCC)   5. Essential hypertension   6. Hyperlipidemia    PLAN:    In order of problems listed above:  1. CAD - He is s/p Anterolateral STEMI treated with DES to the superior subbranch of D2. He is no longer on ASA as he is on Apixaban.  -  Continue Clopidogrel, Apixaban, Atorvastatin, Lisinopril.  -  Plan cardiac rehabilitation after outpatient physical therapy complete  2. Bradycardia - HR is quite low.  He has a hx of bradycardia.  He is not symptomatic.  However, with his hx of balance issues, I am hesitant to leave him on Amiodarone and Metoprolol.  D/w Dr. Angelena Form today.  Will DC Metoprolol Succinate and Amiodarone. Repeat ECG with RN in 1-2 weeks. (Of note, he notes a hx of HRs in the 40-50s).    3. Ischemic cardiomyopathy - No evidence of CHF.  Continue ACE inhibitor. Plan repeat echocardiogram 90 days post myocardial infarction.  4. PAF - Maintaining NSR.  As noted, will DC Amiodarone.  If he has recurrent AF that is symptomatic, will need to refer to EP for help with rhythm control (?Dofetilide).    5. HTN - Borderline controlled.  Continue to monitor.    6. Hyperlipidemia - Continue high-dose statin given recent MI. FU Lipids and LFTs pending later this month.    Medication Adjustments/Labs and Tests Ordered: Current medicines are reviewed at length with the patient today.  Concerns regarding medicines are outlined above.  Medication changes, Labs and Tests ordered today are outlined in the Patient Instructions noted below. Patient Instructions  Medication Instructions:  1. STOP AMIODARONE 2. STOP TOPROL Labwork: FASTING CHOLESTEROL PANEL TO BE DONE 01/06/13 Testing/Procedures: 1. LIMITED ECHO: THIS IS TO BE DONE ON OR AFTER 02/19/16; RE-CHECK EJECTION FRACTION Follow-Up: 1. NURSE VISIT ON  01/07/16 @ 9:15: NEED EKG TO BE DONE DUE TO STOPPED AMIODARONE AND TOPROL  01/03/16 PER Belina Mandile W. PAC 2. DR. Angelena Form IN 3 MONTHS Any Other Special Instructions Will Be Listed Below (If Applicable). If you need a refill on your cardiac medications before your next appointment, please call your pharmacy.  Signed, Richardson Dopp, PA-C  01/03/2016 [facility] [place], [place], [place]  [address] Phone: [phone]; Fax: [phone]

## 2016-01-03 ENCOUNTER — Encounter: Payer: Self-pay | Admitting: Physician Assistant

## 2016-01-03 ENCOUNTER — Ambulatory Visit: Payer: PPO | Attending: Cardiovascular Disease | Admitting: Physical Therapy

## 2016-01-03 ENCOUNTER — Ambulatory Visit (INDEPENDENT_AMBULATORY_CARE_PROVIDER_SITE_OTHER): Payer: PPO | Admitting: Physician Assistant

## 2016-01-03 VITALS — BP 140/80 | HR 38 | Ht 70.0 in | Wt 143.8 lb

## 2016-01-03 DIAGNOSIS — I2510 Atherosclerotic heart disease of native coronary artery without angina pectoris: Secondary | ICD-10-CM

## 2016-01-03 DIAGNOSIS — I10 Essential (primary) hypertension: Secondary | ICD-10-CM

## 2016-01-03 DIAGNOSIS — R2681 Unsteadiness on feet: Secondary | ICD-10-CM | POA: Insufficient documentation

## 2016-01-03 DIAGNOSIS — I480 Paroxysmal atrial fibrillation: Secondary | ICD-10-CM | POA: Diagnosis not present

## 2016-01-03 DIAGNOSIS — R2689 Other abnormalities of gait and mobility: Secondary | ICD-10-CM | POA: Insufficient documentation

## 2016-01-03 DIAGNOSIS — M545 Low back pain: Secondary | ICD-10-CM | POA: Insufficient documentation

## 2016-01-03 DIAGNOSIS — I255 Ischemic cardiomyopathy: Secondary | ICD-10-CM

## 2016-01-03 DIAGNOSIS — Z9181 History of falling: Secondary | ICD-10-CM | POA: Diagnosis not present

## 2016-01-03 DIAGNOSIS — R001 Bradycardia, unspecified: Secondary | ICD-10-CM

## 2016-01-03 DIAGNOSIS — E785 Hyperlipidemia, unspecified: Secondary | ICD-10-CM

## 2016-01-03 DIAGNOSIS — R262 Difficulty in walking, not elsewhere classified: Secondary | ICD-10-CM | POA: Insufficient documentation

## 2016-01-03 NOTE — Therapy (Signed)
[facility] [address] [place], [place], [address] Phone: [phone]   Fax:  [phone]  Physical Therapy Treatment  Patient Details  Name: Henry Franklin. MRN: [id number] Date of Birth: April 19, 1927 Referring Provider: [name] MD  Encounter Date: 01/03/2016      PT End of Session - 01/03/16 1543    Visit Number 10   Number of Visits 17   Date for PT Re-Evaluation 01/25/16   Authorization Type Medicare- G codes needed   PT Start Time 1500   PT Stop Time 1538   PT Time Calculation (min) 38 min   Equipment Utilized During Treatment Gait belt   Activity Tolerance Patient tolerated treatment well   Behavior During Therapy WFL for tasks assessed/performed      Past Medical History:  Diagnosis Date  . CAD (coronary artery disease)    a. STEMI 6/17: LHC - oLAD 10, mLAD 50, dLAD 20, D2 inf subbranch 70 + sup subbranch 100, pRCA 20, oRPDA 40, EF 35-45% >> PCI:  2.25 x 12 mm resolute integrity DES to the superior subbranch of D2   . Essential and other specified forms of tremor   . External hemorrhoids without mention of complication   . History of ST elevation myocardial infarction (STEMI)    a. Ant-Lat STEMI >> DES to D2 subbranch  . History of stroke   . History of TIA (transient ischemic attack)   . HTN (hypertension)   . Hyperlipidemia   . Ischemic cardiomyopathy    a. Echo 6/17: Mid and distal anterior/inferior, septal and apical HK, mild concentric LVH, EF 30-35%, mild LAE  . Lumbago   . Malignant neoplasm of prostate ([place])   . Nontraumatic rupture of tendons of biceps (long head)   . Paroxysmal atrial fibrillation (HCC)    during admit for STEMI 6/17 >> triple Rx high risk >> ASA + Plavix x 30 days post MI, then Eliquis + Plavix  . Sebaceous cyst   . Supraventricular premature beats   . Unspecified anomaly of tooth position     Past Surgical History:  Procedure Laterality Date  .  APPENDECTOMY  1942  . BACK SURGERY     with sciatica  . CARDIAC CATHETERIZATION N/A 11/19/2015   Procedure: Left Heart Cath and Coronary Angiography;  Surgeon: Burnell Blanks, MD;  Location: [facility];  Service: Cardiovascular;  Laterality: N/A;  . CARDIAC CATHETERIZATION N/A 11/19/2015   Procedure: Coronary Stent Intervention;  Surgeon: Burnell Blanks, MD;  Location: [facility];  Service: Cardiovascular;  Laterality: N/A;  . prostate cancer    . SPINE SURGERY N/A 2000   [place]    There were no vitals filed for this visit.      Subjective Assessment - 01/03/16 1519    Subjective Pt went to cardiac orientation today and will start in 3 weeks   Patient is accompained by: Family member   Pertinent History HTN, CVA, TIA, HLD, prostate CA, idiopathic PE, back sx in 2000   Limitations Standing;Walking;Lifting   Patient Stated Goals To improve balance and walking   Currently in Pain? No/denies                         [facility] Adult PT Treatment/Exercise - 01/03/16 0001      Knee/Hip Exercises: Aerobic   Other Aerobic Scifit x 4 extremities level 1.5 x 10 minutes with rpm between 65-75 for strengthening  and activity tolerance  +3 min "cool down"             Balance Exercises - 01/03/16 1539      Balance Exercises: Standing   Stepping Strategy Anterior;Lateral  intermittent UE support, progressed with multilevel reaching   Step Ups 4 inch;Intermittent UE support  Toe taps            PT Education - 01/03/16 1541    Education provided Yes   Education Details Stepping strategies with minimal UE support   Person(s) Educated Patient   Methods Explanation   Comprehension Verbalized understanding          PT Short Term Goals - 12/26/15 2223      PT SHORT TERM GOAL #1   Title Pt will be able to ambulate 300' around obstales with supervision (Target Date: 12/28/2015)   Baseline MET 12/26/2015    Time 4   Period Weeks    Status Achieved     PT SHORT TERM GOAL #2   Title Patient ambulates with head turns to scan environment and is able to maintain path with supervision. (Target Date: 12/28/2015)   Baseline MET 12/26/2015   Time 4   Period Weeks   Status Achieved     PT SHORT TERM GOAL #3   Title Patient demonstrates understanding of initial HEP. (Target Date: 12/28/2015)   Baseline MET 12/26/2015   Time 4   Period Weeks   Status Achieved     PT SHORT TERM GOAL #4   Title Patient reaches 10" anteriorly & to floor without UE support safely. (Target Date: 12/28/2015)   Baseline MET 12/26/2015   Time 4   Period Weeks   Status Achieved           PT Long Term Goals - 12/03/15 1700      PT LONG TERM GOAL #1   Title Pt will be independent with HEP for strengthening and balance to maximaze functoinal gains  (Target Date: 01/25/2016)   Time 8   Period Weeks   Status On-going     PT LONG TERM GOAL #2   Title Pt will be able to ambulate >1000' including grass, ramps, curbs and stairs (1 rail) without device modifed independent to indicate community level mobilty.  (Target Date: 01/25/2016)   Time 8   Period Weeks   Status On-going     PT LONG TERM GOAL #3   Title Pt will be able to improve Berg Balance Score from 22/56 to >36/56 to indicate improve balance (Target Date: 01/25/2016)   Time 8   Period Weeks   Status On-going     PT LONG TERM GOAL #4   Title Pt will be able to improve Dynamic Gait Index from 7/24 to >/= 14/30 to indicate decreased risk for falls (Target Date: 01/25/2016)   Time 8   Period Weeks   Status On-going     PT LONG TERM GOAL #5   Title Pt will be able to improve Timed Up-Go from 15.60s to < 13.5sec to indicate decreased risk for falls (Target Date: 01/25/2016)   Time 8   Period Weeks   Status On-going               Plan - 01/03/16 1544    Clinical Impression Statement Pt progressed tolerance to Sci fit activity with increase resistence and time.  Pt  continues to require intermittent UE support with standing balance  during stepping strategy task.   Rehab Potential  Good   Clinical Impairments Affecting Rehab Potential Patient comorbitiies with limitations from back pain   PT Frequency 2x / week   PT Duration 8 weeks   PT Treatment/Interventions ADLs/Self Care Home Management;Therapeutic exercise;Therapeutic activities;Functional mobility training;Stair training;Gait training;DME Instruction;Balance training;Neuromuscular re-education;Patient/family education;Orthotic Fit/Training;Passive range of motion   PT Next Visit Plan Do G-Code, continue to work on balance, strengthening and dynamic gait toward goals.   Consulted and Agree with Plan of Care Patient;Family member/caregiver   Family Member Consulted wife      Patient will benefit from skilled therapeutic intervention in order to improve the following deficits and impairments:  Abnormal gait, Decreased activity tolerance, Cardiopulmonary status limiting activity, Decreased balance, Decreased strength, Postural dysfunction, Improper body mechanics, Decreased endurance, Decreased knowledge of use of DME, Difficulty walking  Visit Diagnosis: Other abnormalities of gait and mobility  Unsteadiness on feet  Difficulty in walking, not elsewhere classified  History of falling       G-Codes - 2016-01-16 1548    Functional Assessment Tool Used Independent with static standing eyes open/closed, transfers and sitting,  Supervison with functional reach, standing feet together, limited upper trunk rotation, and increased time with turns, external support for SLS, tandem, and alt toe taps on block      Problem List Patient Active Problem List   Diagnosis Date Noted  . CAD (coronary artery disease) 12/03/2015  . Atrial fibrillation ([place]) 11/21/2015  . STEMI (ST elevation myocardial infarction) ([place]) 11/19/2015  . Acute ST elevation myocardial infarction (STEMI) of anterolateral wall ([place])    . Pruritus 01/02/2015  . History of CVA (cerebrovascular accident) 05/31/2014  . Pulmonary embolism ([place]) 05/20/2014  . HTN (hypertension) 05/20/2014  . Unspecified late effects of cerebrovascular disease 05/20/2014  . Late effects of cerebrovascular disease   . Prostate cancer ([place]) 04/05/2014  . Spinal stenosis of lumbar region 11/15/2013  . Falls 05/10/2013  . Hyperlipidemia   . Benign essential tremor   . Supraventricular premature beats   . Lumbago   . Palpitations   . Ingrown toenail without infection 09/07/2012    Bjorn Loser, PTA  01/16/2016, 3:54 PM [facility] [address] [place], [place], [address] Phone: [phone]   Fax:  [phone]  Name: Henry Franklin. MRN: [id number] Date of Birth: July 22, 1926

## 2016-01-03 NOTE — Patient Instructions (Addendum)
Medication Instructions:  1. STOP AMIODARONE 2. STOP TOPROL  Labwork: FASTING CHOLESTEROL PANEL TO BE DONE 01/06/13; LAB OPENS AT 7:30   Testing/Procedures: 1. LIMITED ECHO: THIS IS TO BE DONE ON 02/20/16; RE-CHECK EJECTION FRACTION  Follow-Up: 1. NURSE VISIT ON 01/07/16 @ 9:15: NEED EKG TO BE DONE DUE TO STOPPED AMIODARONE AND TOPROL 01/03/16 PER SCOTT W. PAC  2. DR. Angelena Form ON 04/03/16 @ 1:45 Any Other Special Instructions Will Be Listed Below (If Applicable). If you need a refill on your cardiac medications before your next appointment, please call your pharmacy.

## 2016-01-07 ENCOUNTER — Other Ambulatory Visit: Payer: PPO

## 2016-01-07 ENCOUNTER — Encounter (HOSPITAL_COMMUNITY): Payer: PPO

## 2016-01-07 ENCOUNTER — Ambulatory Visit (INDEPENDENT_AMBULATORY_CARE_PROVIDER_SITE_OTHER): Payer: PPO

## 2016-01-07 DIAGNOSIS — I480 Paroxysmal atrial fibrillation: Secondary | ICD-10-CM

## 2016-01-07 NOTE — Progress Notes (Signed)
Agree ECG reviewed Richardson Dopp, PA-C   01/07/2016 1:33 PM

## 2016-01-07 NOTE — Progress Notes (Signed)
**Note De-Identified  Obfuscation** The pt arrives in the office for an EKG.  At the pts last OV with Richardson Dopp on 8/3 the pt was advised to stop taking Toprol and Amiodarone due to Bradycardia with a HR of 38 bpm and to come to the office today for a EKG.  The pt denies weakness, fatigue, dizziness, lightheadedness or CP and states that he feels "ok".  EKG obtained and given to Richardson Dopp, PA-c for his review. Per Scott EKG ok and the pt and his wife are advised That the pt is to continue current medication-he is reminded not to take Toprol or Amiodarone.  They both verbalized understanding and are in agreement with plan.

## 2016-01-08 ENCOUNTER — Encounter: Payer: Self-pay | Admitting: Physical Therapy

## 2016-01-08 ENCOUNTER — Ambulatory Visit: Payer: PPO | Admitting: Physical Therapy

## 2016-01-08 DIAGNOSIS — M545 Low back pain, unspecified: Secondary | ICD-10-CM

## 2016-01-08 DIAGNOSIS — R2689 Other abnormalities of gait and mobility: Secondary | ICD-10-CM | POA: Diagnosis not present

## 2016-01-08 DIAGNOSIS — R262 Difficulty in walking, not elsewhere classified: Secondary | ICD-10-CM

## 2016-01-08 DIAGNOSIS — Z9181 History of falling: Secondary | ICD-10-CM

## 2016-01-08 DIAGNOSIS — R2681 Unsteadiness on feet: Secondary | ICD-10-CM

## 2016-01-08 NOTE — Therapy (Signed)
[facility] [address] [place], [place], [address] Phone: [phone]   Fax:  [phone]  Physical Therapy Treatment  Patient Details  Name: Henry Franklin. MRN: [id number] Date of Birth: Jul 29, 1926 Referring Provider: [name] MD  Encounter Date: 01/08/2016      PT End of Session - 01/08/16 1822    Visit Number 11   Number of Visits 17   Date for PT Re-Evaluation 01/25/16   Authorization Type Medicare- G codes needed   PT Start Time August 16, 1314   PT Stop Time 1400   PT Time Calculation (min) 44 min   Equipment Utilized During Treatment Gait belt   Activity Tolerance Patient tolerated treatment well   Behavior During Therapy WFL for tasks assessed/performed      Past Medical History:  Diagnosis Date  . CAD (coronary artery disease)    a. STEMI 6/17: LHC - oLAD 10, mLAD 50, dLAD 20, D2 inf subbranch 70 + sup subbranch 100, pRCA 20, oRPDA 40, EF 35-45% >> PCI:  2.25 x 12 mm resolute integrity DES to the superior subbranch of D2   . Essential and other specified forms of tremor   . External hemorrhoids without mention of complication   . History of ST elevation myocardial infarction (STEMI)    a. Ant-Lat STEMI >> DES to D2 subbranch  . History of stroke   . History of TIA (transient ischemic attack)   . HTN (hypertension)   . Hyperlipidemia   . Ischemic cardiomyopathy    a. Echo 6/17: Mid and distal anterior/inferior, septal and apical HK, mild concentric LVH, EF 30-35%, mild LAE  . Lumbago   . Malignant neoplasm of prostate ([place])   . Nontraumatic rupture of tendons of biceps (long head)   . Paroxysmal atrial fibrillation (HCC)    during admit for STEMI 6/17 >> triple Rx high risk >> ASA + Plavix x 30 days post MI, then Eliquis + Plavix  . Sebaceous cyst   . Supraventricular premature beats   . Unspecified anomaly of tooth position     Past Surgical History:  Procedure Laterality Date  .  APPENDECTOMY  1942  . BACK SURGERY     with sciatica  . CARDIAC CATHETERIZATION N/A 11/19/2015   Procedure: Left Heart Cath and Coronary Angiography;  Surgeon: Burnell Blanks, MD;  Location: [facility];  Service: Cardiovascular;  Laterality: N/A;  . CARDIAC CATHETERIZATION N/A 11/19/2015   Procedure: Coronary Stent Intervention;  Surgeon: Burnell Blanks, MD;  Location: [facility];  Service: Cardiovascular;  Laterality: N/A;  . prostate cancer    . SPINE SURGERY N/A 16-Aug-1998   [facility]    There were no vitals filed for this visit.      Subjective Assessment - 01/08/16 1320    Subjective He has stopped taking the medications that PA told him to stop. No falls or issues.    Pertinent History HTN, CVA, TIA, HLD, prostate CA, idiopathic PE, back sx in 08-16-98   Limitations Standing;Walking;Lifting   Patient Stated Goals To improve balance and walking   Currently in Pain? Yes   Pain Score 4    Pain Location Back   Pain Orientation Lower   Pain Descriptors / Indicators Sharp;Dull;Aching  sharp initially, then dull ache   Pain Type Chronic pain   Pain Onset More than a month ago   Pain Frequency Intermittent   Aggravating Factors  standing & gait   Pain Relieving  Factors sitting, rest   Multiple Pain Sites No     Neuromuscular Re-education with manual/tactile cues on balance reactions and postural response. His heart rate at rest 39bpm and progressed to 59bpm with activities.  Near counter with initial contact for LE muscle learning progressed to intermittent support & manual cues from PT: stepping over 4" theraband strip on floor and holding for 3sec: forward, backward, lateral, and rotation sidestep. Progressed to Four-Square balance activity.  Sit to stand without use of UEs 5 reps.  Obstacle course: standing from chair without armrests, stepping over 2" X 4" beams (2), figure 8 turns, over 2 beams to sit in chair. 3 reps without device with min guard.  Standing  without UE support: reciprocal & BUE red theraband rows, biceps curls & forward reach. Gait in hall for visual reference: ambulating with head turns - right/left, up/down & diagonals with manual, verbal cues to maintain path & pace. Gait faster pace with verbal cues on step length and manual/tactile cues on balance. 30' X 3.                               PT Short Term Goals - 12/26/15 2223      PT SHORT TERM GOAL #1   Title Pt will be able to ambulate 300' around obstales with supervision (Target Date: 12/28/2015)   Baseline MET 12/26/2015    Time 4   Period Weeks   Status Achieved     PT SHORT TERM GOAL #2   Title Patient ambulates with head turns to scan environment and is able to maintain path with supervision. (Target Date: 12/28/2015)   Baseline MET 12/26/2015   Time 4   Period Weeks   Status Achieved     PT SHORT TERM GOAL #3   Title Patient demonstrates understanding of initial HEP. (Target Date: 12/28/2015)   Baseline MET 12/26/2015   Time 4   Period Weeks   Status Achieved     PT SHORT TERM GOAL #4   Title Patient reaches 10" anteriorly & to floor without UE support safely. (Target Date: 12/28/2015)   Baseline MET 12/26/2015   Time 4   Period Weeks   Status Achieved           PT Long Term Goals - 12/03/15 1700      PT LONG TERM GOAL #1   Title Pt will be independent with HEP for strengthening and balance to maximaze functoinal gains  (Target Date: 01/25/2016)   Time 8   Period Weeks   Status On-going     PT LONG TERM GOAL #2   Title Pt will be able to ambulate >1000' including grass, ramps, curbs and stairs (1 rail) without device modifed independent to indicate community level mobilty.  (Target Date: 01/25/2016)   Time 8   Period Weeks   Status On-going     PT LONG TERM GOAL #3   Title Pt will be able to improve Berg Balance Score from 22/56 to >36/56 to indicate improve balance (Target Date: 01/25/2016)   Time 8   Period Weeks    Status On-going     PT LONG TERM GOAL #4   Title Pt will be able to improve Dynamic Gait Index from 7/24 to >/= 14/30 to indicate decreased risk for falls (Target Date: 01/25/2016)   Time 8   Period Weeks   Status On-going     PT LONG TERM GOAL #  5   Title Pt will be able to improve Timed Up-Go from 15.60s to < 13.5sec to indicate decreased risk for falls (Target Date: 01/25/2016)   Time 8   Period Weeks   Status On-going               Plan - 01/08/16 1823    Clinical Impression Statement Patient improved balance reactions with repetition and skilled tactile cues on reactions. Patient tolerated progressive balance activities without fatigue but required seated rest every 8-10 minutes.    Rehab Potential Good   Clinical Impairments Affecting Rehab Potential Patient comorbitiies with limitations from back pain   PT Frequency 2x / week   PT Duration 8 weeks   PT Treatment/Interventions ADLs/Self Care Home Management;Therapeutic exercise;Therapeutic activities;Functional mobility training;Stair training;Gait training;DME Instruction;Balance training;Neuromuscular re-education;Patient/family education;Orthotic Fit/Training;Passive range of motion   PT Next Visit Plan continue to work on balance, strengthening and dynamic gait toward goals.   Consulted and Agree with Plan of Care Patient;Family member/caregiver   Family Member Consulted wife      Patient will benefit from skilled therapeutic intervention in order to improve the following deficits and impairments:  Abnormal gait, Decreased activity tolerance, Cardiopulmonary status limiting activity, Decreased balance, Decreased strength, Postural dysfunction, Improper body mechanics, Decreased endurance, Decreased knowledge of use of DME, Difficulty walking  Visit Diagnosis: Other abnormalities of gait and mobility  Unsteadiness on feet  Difficulty in walking, not elsewhere classified  History of falling  Midline low back  pain without sciatica     Problem List Patient Active Problem List   Diagnosis Date Noted  . CAD (coronary artery disease) 12/03/2015  . Atrial fibrillation ([facility]) 11/21/2015  . STEMI (ST elevation myocardial infarction) ([facility]) 11/19/2015  . Acute ST elevation myocardial infarction (STEMI) of anterolateral wall ([facility])   . Pruritus 01/02/2015  . History of CVA (cerebrovascular accident) 05/31/2014  . Pulmonary embolism ([facility]) 05/20/2014  . HTN (hypertension) 05/20/2014  . Unspecified late effects of cerebrovascular disease 05/20/2014  . Late effects of cerebrovascular disease   . Prostate cancer ([facility]) 04/05/2014  . Spinal stenosis of lumbar region 11/15/2013  . Falls 05/10/2013  . Hyperlipidemia   . Benign essential tremor   . Supraventricular premature beats   . Lumbago   . Palpitations   . Ingrown toenail without infection 09/07/2012    Seldon Barrell PT, DPT 01/08/2016, 6:26 PM  [facility] [address] [place], [place], [address] Phone: [phone]   Fax:  [phone]  Name: Henry Franklin. MRN: [id number] Date of Birth: 05-Apr-1927

## 2016-01-09 ENCOUNTER — Encounter (HOSPITAL_COMMUNITY): Payer: PPO

## 2016-01-09 ENCOUNTER — Encounter: Payer: PPO | Admitting: Internal Medicine

## 2016-01-10 ENCOUNTER — Ambulatory Visit: Payer: PPO | Admitting: Physical Therapy

## 2016-01-10 ENCOUNTER — Encounter: Payer: Self-pay | Admitting: Physical Therapy

## 2016-01-10 DIAGNOSIS — R2689 Other abnormalities of gait and mobility: Secondary | ICD-10-CM | POA: Diagnosis not present

## 2016-01-10 DIAGNOSIS — R2681 Unsteadiness on feet: Secondary | ICD-10-CM

## 2016-01-10 DIAGNOSIS — M545 Low back pain, unspecified: Secondary | ICD-10-CM

## 2016-01-10 DIAGNOSIS — Z9181 History of falling: Secondary | ICD-10-CM

## 2016-01-10 DIAGNOSIS — R262 Difficulty in walking, not elsewhere classified: Secondary | ICD-10-CM

## 2016-01-11 ENCOUNTER — Encounter (HOSPITAL_COMMUNITY): Payer: PPO

## 2016-01-14 ENCOUNTER — Encounter (HOSPITAL_COMMUNITY): Payer: PPO

## 2016-01-14 ENCOUNTER — Other Ambulatory Visit: Payer: PPO | Admitting: *Deleted

## 2016-01-14 ENCOUNTER — Other Ambulatory Visit: Payer: PPO

## 2016-01-14 DIAGNOSIS — E785 Hyperlipidemia, unspecified: Secondary | ICD-10-CM | POA: Diagnosis not present

## 2016-01-14 LAB — LIPID PANEL
Cholesterol: 108 mg/dL — ABNORMAL LOW (ref 125–200)
HDL: 42 mg/dL (ref >=40)
LDL Cholesterol: 51 mg/dL (ref <130)
Total CHOL/HDL Ratio: 2.6 Ratio (ref <=5.0)
Triglycerides: 73 mg/dL (ref <150)
VLDL: 15 mg/dL (ref <30)

## 2016-01-14 LAB — HEPATIC FUNCTION PANEL
ALT: 20 U/L (ref 9–46)
AST: 19 U/L (ref 10–35)
Albumin: 3.4 g/dL — ABNORMAL LOW (ref 3.6–5.1)
Alkaline Phosphatase: 66 U/L (ref 40–115)
Bilirubin, Direct: 0.2 mg/dL (ref <=0.2)
Indirect Bilirubin: 0.6 mg/dL (ref 0.2–1.2)
TOTAL PROTEIN: 5.6 g/dL — AB (ref 6.1–8.1)
Total Bilirubin: 0.8 mg/dL (ref 0.2–1.2)

## 2016-01-14 NOTE — Therapy (Signed)
[facility] [address] [place], [place], [address] Phone: [phone]   Fax:  [phone]  Physical Therapy Treatment  Patient Details  Name: Henry Franklin. MRN: [id number] Date of Birth: 07-24-1926 Referring Provider: [name] MD  Encounter Date: 01/10/2016    Past Medical History:  Diagnosis Date  . CAD (coronary artery disease)    a. STEMI 6/17: LHC - oLAD 10, mLAD 50, dLAD 20, D2 inf subbranch 70 + sup subbranch 100, pRCA 20, oRPDA 40, EF 35-45% >> PCI:  2.25 x 12 mm resolute integrity DES to the superior subbranch of D2   . Essential and other specified forms of tremor   . External hemorrhoids without mention of complication   . History of ST elevation myocardial infarction (STEMI)    a. Ant-Lat STEMI >> DES to D2 subbranch  . History of stroke   . History of TIA (transient ischemic attack)   . HTN (hypertension)   . Hyperlipidemia   . Ischemic cardiomyopathy    a. Echo 6/17: Mid and distal anterior/inferior, septal and apical HK, mild concentric LVH, EF 30-35%, mild LAE  . Lumbago   . Malignant neoplasm of prostate ([facility])   . Nontraumatic rupture of tendons of biceps (long head)   . Paroxysmal atrial fibrillation (HCC)    during admit for STEMI 6/17 >> triple Rx high risk >> ASA + Plavix x 30 days post MI, then Eliquis + Plavix  . Sebaceous cyst   . Supraventricular premature beats   . Unspecified anomaly of tooth position     Past Surgical History:  Procedure Laterality Date  . APPENDECTOMY  1942  . BACK SURGERY     with sciatica  . CARDIAC CATHETERIZATION N/A 11/19/2015   Procedure: Left Heart Cath and Coronary Angiography;  Surgeon: Burnell Blanks, MD;  Location: [facility];  Service: Cardiovascular;  Laterality: N/A;  . CARDIAC CATHETERIZATION N/A 11/19/2015   Procedure: Coronary Stent Intervention;  Surgeon: Burnell Blanks, MD;  Location: [facility];   Service: Cardiovascular;  Laterality: N/A;  . prostate cancer    . SPINE SURGERY N/A 2000   [facility]    There were no vitals filed for this visit.     01/10/16 1400  Symptoms/Limitations  Subjective No falls or issues. He did stop medications but not sure if his heart rate is any higher.  Pertinent History HTN, CVA, TIA, HLD, prostate CA, idiopathic PE, back sx in 2000  Limitations Standing;Walking;Lifting  Patient Stated Goals To improve balance and walking  Pain Assessment  Currently in Pain? Yes  Pain Score 2  Pain Location Back  Pain Orientation Lower  Pain Descriptors / Indicators Sharp;Aching  Pain Type Chronic pain  Pain Onset More than a month ago  Pain Frequency Intermittent  Aggravating Factors  standing & gait  Pain Relieving Factors sitting, rest  Effect of Pain on Daily Activities limits standing  Multiple Pain Sites No   Heart Rate rest 41, with activity 56-61bpm with no SOB, SpO2 98-99% Neuromuscular Re-education: At counter for intermittent UE support: Side stepping over theraband (4") on floor. Progressed to side step with overhead reach elongating trunk.  4-square stepping with verbal cues for direction of movement & tactile/ occasional cues for balance reaction.  Alternate stepping forward over 4" theraband & holding position for 3-5 seconds. Progressed to stepping backwards over band & hold. Balance losses when stepping back. Alternate stepping right & left with rotation &  weight shift. Progressed to stepping to side for 90* direction change with cues on weight shift & foot position / movement. Progressed to turning 180* with cues & intermittent UE support. Stretches for trunk extension & rotation. Forward flexion to chair bottom to upright with scapular retraction.   Gait Training: Obstacle course stepping over obstacles 2" - 6" with tactile cues. Negotiating around obstacles with cues on foot position & step length.  Gait with head turns to scan with  tactile cues.  Increasing pace with longer, quicker steps 30' X 4.                             01/10/16 1600  PT Visits / Re-Eval  Visit Number 12  Number of Visits 17  Date for PT Re-Evaluation 01/25/16  Authorization  Authorization Type Medicare- G codes needed  PT Time Calculation  PT Start Time 1403  PT Stop Time 1445  PT Time Calculation (min) 42 min  PT - End of Session  Equipment Utilized During Treatment Gait belt  Activity Tolerance Patient tolerated treatment well  Behavior During Therapy [facility] for tasks assessed/performed      01/10/16 1600  Plan  Clinical Impression Statement Patient improved balance reactions with advancing difficulty of activities. He appears will be on target to meet LTGs and go to Cardiac Rehab at end of plan of care.   Pt will benefit from skilled therapeutic intervention in order to improve on the following deficits Abnormal gait;Decreased activity tolerance;Cardiopulmonary status limiting activity;Decreased balance;Decreased strength;Postural dysfunction;Improper body mechanics;Decreased endurance;Decreased knowledge of use of DME;Difficulty walking  Rehab Potential Good  Clinical Impairments Affecting Rehab Potential Patient comorbitiies with limitations from back pain  PT Frequency 2x / week  PT Duration 8 weeks  PT Treatment/Interventions ADLs/Self Care Home Management;Therapeutic exercise;Therapeutic activities;Functional mobility training;Stair training;Gait training;DME Instruction;Balance training;Neuromuscular re-education;Patient/family education;Orthotic Fit/Training;Passive range of motion  PT Next Visit Plan continue to work on balance, strengthening and dynamic gait toward goals.  Consulted and Agree with Plan of Care Patient         PT Short Term Goals - 12/26/15 2223      PT SHORT TERM GOAL #1   Title Pt will be able to ambulate 300' around obstales with supervision (Target Date: 12/28/2015)   Baseline  MET 12/26/2015    Time 4   Period Weeks   Status Achieved     PT SHORT TERM GOAL #2   Title Patient ambulates with head turns to scan environment and is able to maintain path with supervision. (Target Date: 12/28/2015)   Baseline MET 12/26/2015   Time 4   Period Weeks   Status Achieved     PT SHORT TERM GOAL #3   Title Patient demonstrates understanding of initial HEP. (Target Date: 12/28/2015)   Baseline MET 12/26/2015   Time 4   Period Weeks   Status Achieved     PT SHORT TERM GOAL #4   Title Patient reaches 10" anteriorly & to floor without UE support safely. (Target Date: 12/28/2015)   Baseline MET 12/26/2015   Time 4   Period Weeks   Status Achieved           PT Long Term Goals - 12/03/15 1700      PT LONG TERM GOAL #1   Title Pt will be independent with HEP for strengthening and balance to maximaze functoinal gains  (Target Date: 01/25/2016)   Time 8   Period Weeks  Status On-going     PT LONG TERM GOAL #2   Title Pt will be able to ambulate >1000' including grass, ramps, curbs and stairs (1 rail) without device modifed independent to indicate community level mobilty.  (Target Date: 01/25/2016)   Time 8   Period Weeks   Status On-going     PT LONG TERM GOAL #3   Title Pt will be able to improve Berg Balance Score from 22/56 to >36/56 to indicate improve balance (Target Date: 01/25/2016)   Time 8   Period Weeks   Status On-going     PT LONG TERM GOAL #4   Title Pt will be able to improve Dynamic Gait Index from 7/24 to >/= 14/30 to indicate decreased risk for falls (Target Date: 01/25/2016)   Time 8   Period Weeks   Status On-going     PT LONG TERM GOAL #5   Title Pt will be able to improve Timed Up-Go from 15.60s to < 13.5sec to indicate decreased risk for falls (Target Date: 01/25/2016)   Time 8   Period Weeks   Status On-going             Patient will benefit from skilled therapeutic intervention in order to improve the following deficits  and impairments:  Abnormal gait, Decreased activity tolerance, Cardiopulmonary status limiting activity, Decreased balance, Decreased strength, Postural dysfunction, Improper body mechanics, Decreased endurance, Decreased knowledge of use of DME, Difficulty walking  Visit Diagnosis: Other abnormalities of gait and mobility  Unsteadiness on feet  Difficulty in walking, not elsewhere classified  History of falling  Midline low back pain without sciatica     Problem List Patient Active Problem List   Diagnosis Date Noted  . CAD (coronary artery disease) 12/03/2015  . Atrial fibrillation ([facility]) 11/21/2015  . STEMI (ST elevation myocardial infarction) ([facility]) 11/19/2015  . Acute ST elevation myocardial infarction (STEMI) of anterolateral wall ([facility])   . Pruritus 01/02/2015  . History of CVA (cerebrovascular accident) 05/31/2014  . Pulmonary embolism ([place]) 05/20/2014  . HTN (hypertension) 05/20/2014  . Unspecified late effects of cerebrovascular disease 05/20/2014  . Late effects of cerebrovascular disease   . Prostate cancer ([place]) 04/05/2014  . Spinal stenosis of lumbar region 11/15/2013  . Falls 05/10/2013  . Hyperlipidemia   . Benign essential tremor   . Supraventricular premature beats   . Lumbago   . Palpitations   . Ingrown toenail without infection 09/07/2012    Susumu Hackler PT, DPT 01/14/2016, 12:20 PM  [facility] [address] [place], [place], [address] Phone: [phone]   Fax:  [phone]  Name: Henry Franklin. MRN: [id number] Date of Birth: 1927-05-27

## 2016-01-14 NOTE — Addendum Note (Signed)
Addended by: Eulis Foster on: 01/14/2016 07:36 AM   Modules accepted: Orders

## 2016-01-15 ENCOUNTER — Ambulatory Visit: Payer: PPO | Admitting: Physical Therapy

## 2016-01-15 ENCOUNTER — Telehealth: Payer: Self-pay | Admitting: *Deleted

## 2016-01-15 ENCOUNTER — Telehealth: Payer: Self-pay | Admitting: Cardiovascular Disease

## 2016-01-15 NOTE — Telephone Encounter (Signed)
Patient informed. 

## 2016-01-15 NOTE — Telephone Encounter (Signed)
New message        Pts wife would like the nurse to call her for the results of the pts tests. Please call.

## 2016-01-15 NOTE — Telephone Encounter (Signed)
-----   Message from Burnell Blanks, MD sent at 01/15/2016  1:11 PM EDT ----- Lipids and LFTs are ok. Can we let him know? Thanks, chris

## 2016-01-15 NOTE — Telephone Encounter (Signed)
Wife called back see if patient's medications were changed based off lab work results. Nurse advised wife that no changes were made and that patient needed to fill out paperwork giving [place] permission to speak with her about his health records when they are at the office again. Wife verbalized understanding.

## 2016-01-16 ENCOUNTER — Encounter (HOSPITAL_COMMUNITY): Payer: PPO

## 2016-01-17 ENCOUNTER — Telehealth: Payer: Self-pay | Admitting: Cardiovascular Disease

## 2016-01-17 ENCOUNTER — Ambulatory Visit: Payer: PPO | Admitting: Physical Therapy

## 2016-01-17 NOTE — Telephone Encounter (Signed)
Patient's wife called concerned because she "thought there was an oversight on his Lipitor dose." She explained that she is only on 10 mg daily and the patient is on 80 mg daily. Explained to her that there are many reasons why people are on different doses of the same medication, and detailed to her how statins are dosed and followed with lab work. Reiterated to her that it is listed in [facility] notes he is taking Lipitor 80 mg daily and that his lab work was just tested a couple days ago and everything was fine. She has no further questions and was grateful for assistance.

## 2016-01-17 NOTE — Telephone Encounter (Signed)
New Message  Pt c/o medication issue:  1. Name of Medication: Atorvastatin   2. How are you currently taking this medication (dosage and times per day)? 80mg , daily  3. Are you having a reaction (difficulty breathing--STAT)? No  4. What is your medication issue? Pts wife voiced she's concern of high dosage and she feels it needs to be lowered. Pt would like to know if it needs to be renewed.  Please follow up with pts wife. Thanks!

## 2016-01-18 ENCOUNTER — Encounter (HOSPITAL_COMMUNITY): Payer: PPO

## 2016-01-21 ENCOUNTER — Encounter (HOSPITAL_COMMUNITY): Payer: PPO

## 2016-01-22 ENCOUNTER — Encounter (HOSPITAL_COMMUNITY)
Admission: RE | Admit: 2016-01-22 | Discharge: 2016-01-22 | Disposition: A | Discharge status: Home / Self Care | Payer: PPO | Source: Ambulatory Visit | Attending: Cardiovascular Disease | Admitting: Cardiovascular Disease

## 2016-01-22 ENCOUNTER — Telehealth: Payer: Self-pay | Admitting: Cardiology

## 2016-01-22 ENCOUNTER — Ambulatory Visit: Payer: PPO | Admitting: Physical Therapy

## 2016-01-22 ENCOUNTER — Encounter: Payer: Self-pay | Admitting: Physical Therapy

## 2016-01-22 DIAGNOSIS — I213 ST elevation (STEMI) myocardial infarction of unspecified site: Secondary | ICD-10-CM

## 2016-01-22 DIAGNOSIS — R2689 Other abnormalities of gait and mobility: Secondary | ICD-10-CM | POA: Diagnosis not present

## 2016-01-22 DIAGNOSIS — Z955 Presence of coronary angioplasty implant and graft: Secondary | ICD-10-CM | POA: Diagnosis not present

## 2016-01-22 DIAGNOSIS — I480 Paroxysmal atrial fibrillation: Secondary | ICD-10-CM | POA: Diagnosis not present

## 2016-01-22 DIAGNOSIS — R2681 Unsteadiness on feet: Secondary | ICD-10-CM

## 2016-01-22 DIAGNOSIS — E785 Hyperlipidemia, unspecified: Secondary | ICD-10-CM | POA: Diagnosis not present

## 2016-01-22 DIAGNOSIS — I255 Ischemic cardiomyopathy: Secondary | ICD-10-CM | POA: Insufficient documentation

## 2016-01-22 DIAGNOSIS — I252 Old myocardial infarction: Secondary | ICD-10-CM | POA: Diagnosis not present

## 2016-01-22 DIAGNOSIS — I10 Essential (primary) hypertension: Secondary | ICD-10-CM | POA: Insufficient documentation

## 2016-01-22 DIAGNOSIS — I2510 Atherosclerotic heart disease of native coronary artery without angina pectoris: Secondary | ICD-10-CM | POA: Insufficient documentation

## 2016-01-22 DIAGNOSIS — R262 Difficulty in walking, not elsewhere classified: Secondary | ICD-10-CM

## 2016-01-22 NOTE — Telephone Encounter (Signed)
    Was notified by cardiac rehab that patient had slow pulse rate in the 30s but with normal blood pressure. RN instructed to get EKG which showed sinus bradycardia with PVCs and rate of 47 bpm. No blocks noted. BP 128/80. Patient completely asymptomatic. His chart was reviewed. It appears his metoprolol and amiodarone was recently discontinued 2 weeks ago for bradycardia. Amiodarone has a long half life so suspect brachycardia will continue to improve with time. Suspect lower pulse reading may have been secondary to non conducted PVCs. Ok to proceed with rehab. Monitor closely. Keep f/u with Dr. Angelena Form.   Brittainy Rosita Fire

## 2016-01-22 NOTE — Progress Notes (Signed)
Cardiac Rehab Medication Review by a Pharmacist  Does the patient  feel that his/her medications are working for him/her? yes  Has the patient been experiencing any side effects to the medications prescribed? Yes  Does the patient measure his/her own blood pressure or blood glucose at home?  no   Does the patient have any problems obtaining medications due to transportation or finances?   yes  Understanding of regimen: good Understanding of indications: good Potential of compliance: good    Pharmacist comments: Patient presents today with his wife. Wife manages his medications for him or else he wouldn't remember to take them. She lays them out for him every day, denies missing any doses. Patient complains of nerve pain. Recommended to address this with PCP.   Gwenlyn Perking, PharmD PGY1 Pharmacy Resident Pager: [phone] 01/22/2016 3:14 PM

## 2016-01-22 NOTE — Progress Notes (Signed)
Mr Danielle Dess is here for orientation for cardiac rehab. Resting heart noted at 40-41. Pt is asymptomatic other then being slightly unsteady on his feet. Blood pressure 133/76.  Oxygen saturation 97% on room air . Mr Colee is finishing up the boost program for fall risk improvement and is scheduled to complete his therapy this week. Ellen Henri PA called and notified. 12 lead ECG ordered and obtained. Mr Gotte says he has not been taking his amiodarone or metoprolol since it was discontinued. [place] reviewed the 12 lead ECG and came down to talk with Mr Guldner. No new orders received. Mr Waibel left cardiac rehab and went to his physical therapy session. [place] said Mr Younkin is okay to proceed with his walk test and exercise next week. Patient and wife states understanding.Barnet Pall, RN,BSN 01/22/2016 4:31 PM

## 2016-01-22 NOTE — Therapy (Signed)
[facility] [address] [place], [place], [address] Phone: [phone]   Fax:  [phone]  Physical Therapy Treatment  Patient Details  Name: Henry Franklin. MRN: [id number] Date of Birth: [birth year] Referring Provider: [name] MD  Encounter Date: 01/22/2016      PT End of Session - 01/22/16 2204    Visit Number 13   Number of Visits 17   Date for PT Re-Evaluation 01/25/16   Authorization Type Medicare- G codes needed   PT Start Time 1623   PT Stop Time 1701   PT Time Calculation (min) 38 min   Equipment Utilized During Treatment Gait belt   Activity Tolerance Patient tolerated treatment well   Behavior During Therapy WFL for tasks assessed/performed      Past Medical History:  Diagnosis Date  . CAD (coronary artery disease)    a. STEMI 6/17: LHC - oLAD 10, mLAD 50, dLAD 20, D2 inf subbranch 70 + sup subbranch 100, pRCA 20, oRPDA 40, EF 35-45% >> PCI:  2.25 x 12 mm resolute integrity DES to the superior subbranch of D2   . Essential and other specified forms of tremor   . External hemorrhoids without mention of complication   . History of ST elevation myocardial infarction (STEMI)    a. Ant-Lat STEMI >> DES to D2 subbranch  . History of stroke   . History of TIA (transient ischemic attack)   . HTN (hypertension)   . Hyperlipidemia   . Ischemic cardiomyopathy    a. Echo 6/17: Mid and distal anterior/inferior, septal and apical HK, mild concentric LVH, EF 30-35%, mild LAE  . Lumbago   . Malignant neoplasm of prostate ([facility])   . Nontraumatic rupture of tendons of biceps (long head)   . Paroxysmal atrial fibrillation (HCC)    during admit for STEMI 6/17 >> triple Rx high risk >> ASA + Plavix x 30 days post MI, then Eliquis + Plavix  . Sebaceous cyst   . Supraventricular premature beats   . Unspecified anomaly of tooth position     Past Surgical History:  Procedure Laterality Date  .  APPENDECTOMY  1942  . BACK SURGERY     with sciatica  . CARDIAC CATHETERIZATION N/A 11/19/2015   Procedure: Left Heart Cath and Coronary Angiography;  Surgeon: Burnell Blanks, MD;  Location: [facility];  Service: Cardiovascular;  Laterality: N/A;  . CARDIAC CATHETERIZATION N/A 11/19/2015   Procedure: Coronary Stent Intervention;  Surgeon: Burnell Blanks, MD;  Location: [facility];  Service: Cardiovascular;  Laterality: N/A;  . prostate cancer    . SPINE SURGERY N/A 2000   [facility]    There were no vitals filed for this visit.      Subjective Assessment - 01/22/16 1623    Subjective He went to cardiac rehab for pre-admission testing today with plan to start program next week.    Pertinent History HTN, CVA, TIA, HLD, prostate CA, idiopathic PE, back sx in 2000   Limitations Standing;Walking;Lifting   Patient Stated Goals To improve balance and walking   Currently in Pain? No/denies            [facility] PT Assessment - 01/22/16 1615      Transfers   Sit to Stand 6: Modified independent (Device/Increase time);Without upper extremity assist;From chair/3-in-1   Stand to Sit 6: Modified independent (Device/Increase time)     Ambulation/Gait   Ambulation/Gait Yes   Ambulation/Gait Assistance 6:  Modified independent (Device/Increase time)   Ambulation Distance (Feet) 1200 Feet   Assistive device None   Gait Pattern Step-through pattern;Trunk flexed   Ambulation Surface Indoor;Level;Outdoor;Gravel;Grass   Gait velocity 2.89 ft/sec comfortable, 3.01 ft/sec fast   Stairs Yes   Stairs Assistance 6: Modified independent (Device/Increase time)   Stair Management Technique One rail Right;Alternating pattern;Forwards   Number of Stairs 4  3 reps   Ramp 6: Modified independent (Device)  no device   Curb 6: Modified independent (Device/increase time)  no device     6 Minute Walk- Baseline   6 Minute Walk- Baseline yes   HR (bpm) (!)  48   02 Sat (%RA) 99 %    Modified Borg Scale for Dyspnea 0.5- Very, very slight shortness of breath     6 Minute walk- Post Test   6 Minute Walk Post Test yes   HR (bpm) 68   02 Sat (%RA) 98 %   Modified Borg Scale for Dyspnea 2- Mild shortness of breath     6 minute walk test results    Aerobic Endurance Distance Walked 1117     Berg Balance Test   Sit to Stand Able to stand without using hands and stabilize independently   Standing Unsupported Able to stand safely 2 minutes   Sitting with Back Unsupported but Feet Supported on Floor or Stool Able to sit safely and securely 2 minutes   Stand to Sit Sits safely with minimal use of hands   Transfers Able to transfer safely, minor use of hands   Standing Unsupported with Eyes Closed Able to stand 10 seconds with supervision   Standing Ubsupported with Feet Together Needs help to attain position but able to stand for 30 seconds with feet together   From Standing, Reach Forward with Outstretched Arm Can reach forward >5 cm safely (2")   From Standing Position, Pick up Object from Floor Able to pick up shoe, needs supervision   From Standing Position, Turn to Look Behind Over each Shoulder Turn sideways only but maintains balance   Turn 360 Degrees Needs close supervision or verbal cueing   Standing Unsupported, Alternately Place Feet on Step/Stool Needs assistance to keep from falling or unable to try   Standing Unsupported, One Foot in Front Needs help to step but can hold 15 seconds   Standing on One Leg Unable to try or needs assist to prevent fall   Total Score 33     Dynamic Gait Index   Level Surface Mild Impairment   Change in Gait Speed Mild Impairment   Gait with Horizontal Head Turns Moderate Impairment   Gait with Vertical Head Turns Moderate Impairment   Gait and Pivot Turn Mild Impairment   Step Over Obstacle Mild Impairment   Step Around Obstacles Mild Impairment   Steps Mild Impairment   Total Score 14     Timed Up and Go Test   Normal  TUG (seconds) 13.44  no device     High Level Balance   High Level Balance Activites Backward walking;Side stepping;Turns;Sudden stops   High Level Balance Comments modified independent without device                             PT Education - 01/22/16 2213    Education provided Yes   Education Details postue standing near [employer] for support   Person(s) Educated Patient;Spouse   Methods Explanation;Verbal cues;Demonstration   Comprehension  Verbalized understanding          PT Short Term Goals - 12/26/15 2223      PT SHORT TERM GOAL #1   Title Pt will be able to ambulate 300' around obstales with supervision (Target Date: 12/28/2015)   Baseline MET 12/26/2015    Time 4   Period Weeks   Status Achieved     PT SHORT TERM GOAL #2   Title Patient ambulates with head turns to scan environment and is able to maintain path with supervision. (Target Date: 12/28/2015)   Baseline MET 12/26/2015   Time 4   Period Weeks   Status Achieved     PT SHORT TERM GOAL #3   Title Patient demonstrates understanding of initial HEP. (Target Date: 12/28/2015)   Baseline MET 12/26/2015   Time 4   Period Weeks   Status Achieved     PT SHORT TERM GOAL #4   Title Patient reaches 10" anteriorly & to floor without UE support safely. (Target Date: 12/28/2015)   Baseline MET 12/26/2015   Time 4   Period Weeks   Status Achieved           PT Long Term Goals - 01/22/16 1624      PT LONG TERM GOAL #1   Title Pt will be independent with HEP for strengthening and balance to maximaze functoinal gains  (Target Date: 01/25/2016)   Baseline MET 8/22/017   Time 8   Period Weeks   Status Achieved     PT LONG TERM GOAL #2   Title Pt will be able to ambulate >1000' including grass, ramps, curbs and stairs (1 rail) without device modifed independent to indicate community level mobilty.  (Target Date: 01/25/2016)   Baseline MET 01/22/2016   Time 8   Period Weeks   Status Achieved      PT LONG TERM GOAL #3   Title Pt will be able to improve Berg Balance Score from 22/56 to >36/56 to indicate improve balance (Target Date: 01/25/2016)   Baseline partial MET 01/22/2016 Berg Balance improved to 33/56   Time 8   Period Weeks   Status Partially Met     PT LONG TERM GOAL #4   Title Pt will be able to improve Dynamic Gait Index from 7/24 to >/= 14/24 to indicate decreased risk for falls (Target Date: 01/25/2016)   Baseline MET 01/22/2016    DGI 14/24   Time 8   Period Weeks   Status Achieved     PT LONG TERM GOAL #5   Title Pt will be able to improve Timed Up-Go from 15.60s to < 13.5sec to indicate decreased risk for falls (Target Date: 01/25/2016)   Baseline MET 01/22/2016   TUG 13.44sec without device   Time 8   Period Weeks   Status Achieved               Plan - 01/22/16 2214    Clinical Impression Statement Patient met or partially met all LTGs. He appears safe to function at cardiac rehab program with lower fall risk   Rehab Potential Good   Clinical Impairments Affecting Rehab Potential Patient comorbitiies with limitations from back pain   PT Frequency 2x / week   PT Duration 8 weeks   PT Treatment/Interventions ADLs/Self Care Home Management;Therapeutic exercise;Therapeutic activities;Functional mobility training;Stair training;Gait training;DME Instruction;Balance training;Neuromuscular re-education;Patient/family education;Orthotic Fit/Training;Passive range of motion   PT Next Visit Plan discharge PT   Consulted and Agree with Plan of Care  Patient      Patient will benefit from skilled therapeutic intervention in order to improve the following deficits and impairments:  Abnormal gait, Decreased activity tolerance, Cardiopulmonary status limiting activity, Decreased balance, Decreased strength, Postural dysfunction, Improper body mechanics, Decreased endurance, Decreased knowledge of use of DME, Difficulty walking  Visit Diagnosis: Other  abnormalities of gait and mobility  Unsteadiness on feet  Difficulty in walking, not elsewhere classified       G-Codes - 18-Feb-2016 08/25/15    Functional Assessment Tool Used Merrilee Jansky Balance 33/56   Functional Limitation Mobility: Walking and moving around   Mobility: Walking and Moving Around Goal Status [phone]) At least 20 percent but less than 40 percent impaired, limited or restricted   Mobility: Walking and Moving Around Discharge Status [phone]) At least 20 percent but less than 40 percent impaired, limited or restricted      Problem List Patient Active Problem List   Diagnosis Date Noted  . CAD (coronary artery disease) 12/03/2015  . Atrial fibrillation ([facility]) 11/21/2015  . STEMI (ST elevation myocardial infarction) ([facility]) 11/19/2015  . Acute ST elevation myocardial infarction (STEMI) of anterolateral wall ([facility])   . Pruritus 01/02/2015  . History of CVA (cerebrovascular accident) 05/31/2014  . Pulmonary embolism ([facility]) 05/20/2014  . HTN (hypertension) 05/20/2014  . Unspecified late effects of cerebrovascular disease 05/20/2014  . Late effects of cerebrovascular disease   . Prostate cancer ([place]) 04/05/2014  . Spinal stenosis of lumbar region 11/15/2013  . Falls 05/10/2013  . Hyperlipidemia   . Benign essential tremor   . Supraventricular premature beats   . Lumbago   . Palpitations   . Ingrown toenail without infection 09/07/2012   PHYSICAL THERAPY DISCHARGE SUMMARY  Visits from Start of Care: 13  Current functional level related to goals / functional outcomes: See above   Remaining deficits: See above   Education / Equipment: HEP & fall prevention strategies  Plan: Patient agrees to discharge.  Patient goals were met. Patient is being discharged due to meeting the stated rehab goals.  ?????        Rei Medlen PT, DPT 02-18-16, 10:18 PM  [facility] [address] [place], [place],  [address] Phone: [phone]   Fax:  [phone]  Name: Jonel Weldon. MRN: [id number] Date of Birth: [birth year]

## 2016-01-23 ENCOUNTER — Encounter (HOSPITAL_COMMUNITY): Payer: PPO

## 2016-01-23 ENCOUNTER — Ambulatory Visit: Payer: PPO | Admitting: Physical Therapy

## 2016-01-25 ENCOUNTER — Encounter (HOSPITAL_COMMUNITY): Payer: PPO

## 2016-01-28 ENCOUNTER — Encounter (HOSPITAL_COMMUNITY)
Admission: RE | Admit: 2016-01-28 | Discharge: 2016-01-28 | Disposition: A | Discharge status: Home / Self Care | Payer: PPO | Source: Ambulatory Visit | Attending: Cardiovascular Disease | Admitting: Cardiovascular Disease

## 2016-01-28 ENCOUNTER — Encounter (HOSPITAL_COMMUNITY): Payer: PPO

## 2016-01-28 DIAGNOSIS — I252 Old myocardial infarction: Secondary | ICD-10-CM | POA: Diagnosis not present

## 2016-01-28 DIAGNOSIS — Z955 Presence of coronary angioplasty implant and graft: Secondary | ICD-10-CM

## 2016-01-28 DIAGNOSIS — I213 ST elevation (STEMI) myocardial infarction of unspecified site: Secondary | ICD-10-CM

## 2016-01-29 NOTE — Progress Notes (Signed)
Cardiac Individual Treatment Plan  Patient Details  Name: Henry Franklin. MRN: [id number] Date of Birth: 1927-03-19 Referring Provider:   Flowsheet Row CARDIAC REHAB PHASE II EXERCISE from 01/28/2016 in [place]  Referring Provider  [place]      Initial Encounter Date:  [place] PHASE II EXERCISE from 01/28/2016 in [place]  Date  01/29/16  Referring Provider  [place]      Visit Diagnosis: ST elevation myocardial infarction (STEMI), unspecified artery ([place])  Stented coronary artery  Patient's Home Medications on Admission:  Current Outpatient Prescriptions:  .  apixaban (ELIQUIS) 5 MG TABS tablet, Take 1 tablet (5 mg total) by mouth 2 (two) times daily., Disp: 180 tablet, Rfl: 3 .  atorvastatin (LIPITOR) 80 MG tablet, Take 1 tablet (80 mg total) by mouth daily at 6 PM., Disp: 30 tablet, Rfl: 11 .  Cholecalciferol (VITAMIN D-3) 1000 UNITS CAPS, Take 1 capsule by mouth daily. Take one tablet by mouth once daily., Disp: , Rfl:  .  clopidogrel (PLAVIX) 75 MG tablet, Take 1 tablet (75 mg total) by mouth daily with breakfast., Disp: 30 tablet, Rfl: 11 .  lisinopril (PRINIVIL,ZESTRIL) 2.5 MG tablet, Take 1 tablet (2.5 mg total) by mouth daily., Disp: 30 tablet, Rfl: 11 .  Multiple Vitamin (MULTIVITAMIN) tablet, Take 1 tablet by mouth daily., Disp: , Rfl:  .  nitroGLYCERIN (NITROSTAT) 0.4 MG SL tablet, Place 1 tablet (0.4 mg total) under the tongue every 5 (five) minutes x 3 doses as needed for chest pain., Disp: 25 tablet, Rfl: 3  Past Medical History: Past Medical History:  Diagnosis Date  . CAD (coronary artery disease)    a. STEMI 6/17: LHC - oLAD 10, mLAD 50, dLAD 20, D2 inf subbranch 70 + sup subbranch 100, pRCA 20, oRPDA 40, EF 35-45% >> PCI:  2.25 x 12 mm resolute integrity DES to the superior subbranch of D2   . Essential and other specified forms of tremor   . External hemorrhoids without  mention of complication   . History of ST elevation myocardial infarction (STEMI)    a. Ant-Lat STEMI >> DES to D2 subbranch  . History of stroke   . History of TIA (transient ischemic attack)   . HTN (hypertension)   . Hyperlipidemia   . Ischemic cardiomyopathy    a. Echo 6/17: Mid and distal anterior/inferior, septal and apical HK, mild concentric LVH, EF 30-35%, mild LAE  . Lumbago   . Malignant neoplasm of prostate ([place])   . Nontraumatic rupture of tendons of biceps (long head)   . Paroxysmal atrial fibrillation ([facility])    during admit for STEMI 6/17 >> triple Rx high risk >> ASA + Plavix x 30 days post MI, then Eliquis + Plavix  . Sebaceous cyst   . Supraventricular premature beats   . Unspecified anomaly of tooth position     Tobacco Use: History  Smoking Status  . Never Smoker  Smokeless Tobacco  . Never Used    Labs: Recent Review Flowsheet Data    Labs for ITP Cardiac and Pulmonary Rehab Latest Ref Rng & Units 07/03/2014 11/19/2015 11/20/2015 12/05/2015 01/14/2016   Cholestrol 125 - 200 mg/dL 174 152 113 91(L) 108(L)   LDLCALC <130 mg/dL 100(H) 76 56 36 51   HDL >=40 mg/dL 52 48 36(L) 46 42   Trlycerides <150 mg/dL 111 139 107 47 73   TCO2 0 - 100 mmol/L - 31 - - -  Capillary Blood Glucose: No results found for: GLUCAP   Exercise Target Goals: Date: 01/29/16  Exercise Program Goal: Individual exercise prescription set with THRR, safety & activity barriers. Participant demonstrates ability to understand and report RPE using BORG scale, to self-measure pulse accurately, and to acknowledge the importance of the exercise prescription.  Exercise Prescription Goal: Starting with aerobic activity 30 plus minutes a day, 3 days per week for initial exercise prescription. Provide home exercise prescription and guidelines that participant acknowledges understanding prior to discharge.  Activity Barriers & Risk Stratification:     Activity Barriers & Cardiac Risk  Stratification - 01/22/16 1203      Activity Barriers & Cardiac Risk Stratification   Cardiac Risk Stratification High      6 Minute Walk:     6 Minute Walk    Row Name 01/28/16 1400 01/29/16 0838       6 Minute Walk   Phase Initial  -    Distance 1281 feet -    Walk Time 6 minutes -    MPH 2.42 -    METS 2.2 -    RPE 11 -    VO2 Peak 7.72  -    Resting HR 48 bpm -    Resting BP 108/70 -    Max Ex. HR 82 bpm -    Max Ex. BP 138/76 -    2 Minute Post BP 124/60 -       Initial Exercise Prescription:     Initial Exercise Prescription - 01/29/16 0800      Date of Initial Exercise RX and Referring Provider   Date 01/29/16   Referring Provider [place]     NuStep   Level 1   Minutes 10   METs 2     Arm Ergometer   Level 1   Watts 23   Minutes 10     Track   Laps 7   Minutes 10     Prescription Details   Frequency (times per week) 3   Duration Progress to 30 minutes of continuous aerobic without signs/symptoms of physical distress     Intensity   THRR 40-80% of Max Heartrate 53-106   Ratings of Perceived Exertion 11-13     Progression   Progression Continue to progress workloads to maintain intensity without signs/symptoms of physical distress.     Resistance Training   Training Prescription Yes   Weight 2   Reps 10-12      Perform Capillary Blood Glucose checks as needed.  Exercise Prescription Changes:   Exercise Comments:   Discharge Exercise Prescription (Final Exercise Prescription Changes):   Nutrition:  Target Goals: Understanding of nutrition guidelines, daily intake of sodium 1500mg , cholesterol 200mg , calories 30% from fat and 7% or less from saturated fats, daily to have 5 or more servings of fruits and vegetables.  Biometrics:    Nutrition Therapy Plan and Nutrition Goals:   Nutrition Discharge: Nutrition Scores:   Nutrition Goals Re-Evaluation:   Psychosocial: Target Goals: Acknowledge presence or absence of  depression, maximize coping skills, provide positive support system. Participant is able to verbalize types and ability to use techniques and skills needed for reducing stress and depression.  Initial Review & Psychosocial Screening:     Initial Psych Review & Screening - 01/29/16 [facility]? Yes     Barriers   Psychosocial barriers to participate in program There are no identifiable barriers or psychosocial  needs.     Screening Interventions   Interventions Encouraged to exercise      Quality of Life Scores:   PHQ-9: Recent Review Flowsheet Data    Depression screen [facility] 2/9 01/02/2016 12/18/2015 07/11/2015 05/31/2014 04/05/2014   Decreased Interest 0 0 0 0 0   Down, Depressed, Hopeless 0 0 0 0 0   PHQ - 2 Score 0 0 0 0 0      Psychosocial Evaluation and Intervention:   Psychosocial Re-Evaluation:   Vocational Rehabilitation: Provide vocational rehab assistance to qualifying candidates.   Vocational Rehab Evaluation & Intervention:     Vocational Rehab - 01/29/16 0850      Initial Vocational Rehab Evaluation & Intervention   Assessment shows need for Vocational Rehabilitation No      Education: Education Goals: Education classes will be provided on a weekly basis, covering required topics. Participant will state understanding/return demonstration of topics presented.  Learning Barriers/Preferences:     Learning Barriers/Preferences - 01/22/16 1203      Learning Barriers/Preferences   Learning Barriers Sight   Learning Preferences Group Instruction;Individual Instruction;Verbal Instruction;Written Material      Education Topics: Count Your Pulse:  -Group instruction provided by verbal instruction, demonstration, patient participation and written materials to support subject.  Instructors address importance of being able to find your pulse and how to count your pulse when at home without a heart monitor.  Patients get hands  on experience counting their pulse with staff help and individually.   Heart Attack, Angina, and Risk Factor Modification:  -Group instruction provided by verbal instruction, video, and written materials to support subject.  Instructors address signs and symptoms of angina and heart attacks.    Also discuss risk factors for heart disease and how to make changes to improve heart health risk factors.   Functional Fitness:  -Group instruction provided by verbal instruction, demonstration, patient participation, and written materials to support subject.  Instructors address safety measures for doing things around the house.  Discuss how to get up and down off the floor, how to pick things up properly, how to safely get out of a chair without assistance, and balance training.   Meditation and Mindfulness:  -Group instruction provided by verbal instruction, patient participation, and written materials to support subject.  Instructor addresses importance of mindfulness and meditation practice to help reduce stress and improve awareness.  Instructor also leads participants through a meditation exercise.    Stretching for Flexibility and Mobility:  -Group instruction provided by verbal instruction, patient participation, and written materials to support subject.  Instructors lead participants through series of stretches that are designed to increase flexibility thus improving mobility.  These stretches are additional exercise for major muscle groups that are typically performed during regular warm up and cool down.   Hands Only CPR Anytime:  -Group instruction provided by verbal instruction, video, patient participation and written materials to support subject.  Instructors co-teach with AHA video for hands only CPR.  Participants get hands on experience with mannequins.   Nutrition I class: Heart Healthy Eating:  -Group instruction provided by PowerPoint slides, verbal discussion, and written  materials to support subject matter. The instructor gives an explanation and review of the Therapeutic Lifestyle Changes diet recommendations, which includes a discussion on lipid goals, dietary fat, sodium, fiber, plant stanol/sterol esters, sugar, and the components of a well-balanced, healthy diet.   Nutrition II class: Lifestyle Skills:  -Group instruction provided by PowerPoint slides, verbal discussion, and written materials  to support subject matter. The instructor gives an explanation and review of label reading, grocery shopping for heart health, heart healthy recipe modifications, and ways to make healthier choices when eating out.   Diabetes Question & Answer:  -Group instruction provided by PowerPoint slides, verbal discussion, and written materials to support subject matter. The instructor gives an explanation and review of diabetes co-morbidities, pre- and post-prandial blood glucose goals, pre-exercise blood glucose goals, signs, symptoms, and treatment of hypoglycemia and hyperglycemia, and foot care basics.   Diabetes Blitz:  -Group instruction provided by PowerPoint slides, verbal discussion, and written materials to support subject matter. The instructor gives an explanation and review of the physiology behind type 1 and type 2 diabetes, diabetes medications and rational behind using different medications, pre- and post-prandial blood glucose recommendations and Hemoglobin A1c goals, diabetes diet, and exercise including blood glucose guidelines for exercising safely.    Portion Distortion:  -Group instruction provided by PowerPoint slides, verbal discussion, written materials, and food models to support subject matter. The instructor gives an explanation of serving size versus portion size, changes in portions sizes over the last 20 years, and what consists of a serving from each food group.   Stress Management:  -Group instruction provided by verbal instruction, video, and  written materials to support subject matter.  Instructors review role of stress in heart disease and how to cope with stress positively.     Exercising on Your Own:  -Group instruction provided by verbal instruction, power point, and written materials to support subject.  Instructors discuss benefits of exercise, components of exercise, frequency and intensity of exercise, and end points for exercise.  Also discuss use of nitroglycerin and activating EMS.  Review options of places to exercise outside of rehab.  Review guidelines for sex with heart disease.   Cardiac Drugs I:  -Group instruction provided by verbal instruction and written materials to support subject.  Instructor reviews cardiac drug classes: antiplatelets, anticoagulants, beta blockers, and statins.  Instructor discusses reasons, side effects, and lifestyle considerations for each drug class.   Cardiac Drugs II:  -Group instruction provided by verbal instruction and written materials to support subject.  Instructor reviews cardiac drug classes: angiotensin converting enzyme inhibitors (ACE-I), angiotensin II receptor blockers (ARBs), nitrates, and calcium channel blockers.  Instructor discusses reasons, side effects, and lifestyle considerations for each drug class.   Anatomy and Physiology of the Circulatory System:  -Group instruction provided by verbal instruction, video, and written materials to support subject.  Reviews functional anatomy of heart, how it relates to various diagnoses, and what role the heart plays in the overall system.   Knowledge Questionnaire Score:   Core Components/Risk Factors/Patient Goals at Admission:     Personal Goals and Risk Factors at Admission - 01/22/16 1204      Core Components/Risk Factors/Patient Goals on Admission   Sedentary Yes   Intervention Provide advice, education, support and counseling about physical activity/exercise needs.;Develop an individualized exercise prescription  for aerobic and resistive training based on initial evaluation findings, risk stratification, comorbidities and participant's personal goals.   Expected Outcomes Achievement of increased cardiorespiratory fitness and enhanced flexibility, muscular endurance and strength shown through measurements of functional capacity and personal statement of participant.   Increase Strength and Stamina Yes   Intervention Provide advice, education, support and counseling about physical activity/exercise needs.;Develop an individualized exercise prescription for aerobic and resistive training based on initial evaluation findings, risk stratification, comorbidities and participant's personal goals.   Expected Outcomes Achievement  of increased cardiorespiratory fitness and enhanced flexibility, muscular endurance and strength shown through measurements of functional capacity and personal statement of participant.   Hypertension Yes   Intervention Provide education on lifestyle modifcations including regular physical activity/exercise, weight management, moderate sodium restriction and increased consumption of fresh fruit, vegetables, and low fat dairy, alcohol moderation, and smoking cessation.;Monitor prescription use compliance.   Expected Outcomes Short Term: Continued assessment and intervention until BP is < 140/9mm HG in hypertensive participants. < 130/5mm HG in hypertensive participants with diabetes, heart failure or chronic kidney disease.;Long Term: Maintenance of blood pressure at goal levels.   Lipids Yes   Intervention Provide education and support for participant on nutrition & aerobic/resistive exercise along with prescribed medications to achieve LDL 70mg , HDL >40mg .   Expected Outcomes Short Term: Participant states understanding of desired cholesterol values and is compliant with medications prescribed. Participant is following exercise prescription and nutrition guidelines.;Long Term: Cholesterol  controlled with medications as prescribed, with individualized exercise RX and with personalized nutrition plan. Value goals: LDL < 70mg , HDL > 40 mg.      Core Components/Risk Factors/Patient Goals Review:    Core Components/Risk Factors/Patient Goals at Discharge (Final Review):    ITP Comments:     ITP Comments    Row Name 01/22/16 1151           ITP Comments Dr. Fransico Him, Medical Director          Comments: Patient attended orientation from 1330to 1430 to review rules and guidelines for program. Completed 6 minute walk test, Intitial ITP, and exercise prescription.  VSS. Telemetry-sinus bradycardia, resting HR-48.  Asymptomatic.

## 2016-01-30 ENCOUNTER — Encounter (HOSPITAL_COMMUNITY): Payer: PPO

## 2016-01-30 ENCOUNTER — Encounter (HOSPITAL_COMMUNITY)
Admission: RE | Admit: 2016-01-30 | Discharge: 2016-01-30 | Disposition: A | Discharge status: Home / Self Care | Payer: PPO | Source: Ambulatory Visit | Attending: Cardiovascular Disease | Admitting: Cardiovascular Disease

## 2016-01-30 VITALS — BP 108/70 | HR 48 | Ht 68.75 in | Wt 144.0 lb

## 2016-01-30 DIAGNOSIS — I213 ST elevation (STEMI) myocardial infarction of unspecified site: Secondary | ICD-10-CM

## 2016-01-30 DIAGNOSIS — I252 Old myocardial infarction: Secondary | ICD-10-CM | POA: Diagnosis not present

## 2016-02-01 ENCOUNTER — Encounter (HOSPITAL_COMMUNITY): Payer: PPO

## 2016-02-01 ENCOUNTER — Encounter (HOSPITAL_COMMUNITY)
Admission: RE | Admit: 2016-02-01 | Discharge: 2016-02-01 | Disposition: A | Discharge status: Home / Self Care | Payer: PPO | Source: Ambulatory Visit | Attending: Cardiovascular Disease | Admitting: Cardiovascular Disease

## 2016-02-01 DIAGNOSIS — E785 Hyperlipidemia, unspecified: Secondary | ICD-10-CM | POA: Insufficient documentation

## 2016-02-01 DIAGNOSIS — I10 Essential (primary) hypertension: Secondary | ICD-10-CM | POA: Insufficient documentation

## 2016-02-01 DIAGNOSIS — I480 Paroxysmal atrial fibrillation: Secondary | ICD-10-CM | POA: Insufficient documentation

## 2016-02-01 DIAGNOSIS — I252 Old myocardial infarction: Secondary | ICD-10-CM | POA: Diagnosis not present

## 2016-02-01 DIAGNOSIS — I2510 Atherosclerotic heart disease of native coronary artery without angina pectoris: Secondary | ICD-10-CM | POA: Insufficient documentation

## 2016-02-01 DIAGNOSIS — I213 ST elevation (STEMI) myocardial infarction of unspecified site: Secondary | ICD-10-CM

## 2016-02-01 DIAGNOSIS — I255 Ischemic cardiomyopathy: Secondary | ICD-10-CM | POA: Insufficient documentation

## 2016-02-01 DIAGNOSIS — Z955 Presence of coronary angioplasty implant and graft: Secondary | ICD-10-CM | POA: Diagnosis not present

## 2016-02-06 ENCOUNTER — Other Ambulatory Visit: Payer: Self-pay

## 2016-02-06 ENCOUNTER — Encounter (HOSPITAL_COMMUNITY): Payer: PPO

## 2016-02-06 ENCOUNTER — Encounter (HOSPITAL_COMMUNITY)
Admission: RE | Admit: 2016-02-06 | Discharge: 2016-02-06 | Disposition: A | Discharge status: Home / Self Care | Payer: PPO | Source: Ambulatory Visit | Attending: Cardiovascular Disease | Admitting: Cardiovascular Disease

## 2016-02-06 DIAGNOSIS — Z955 Presence of coronary angioplasty implant and graft: Secondary | ICD-10-CM

## 2016-02-06 DIAGNOSIS — I252 Old myocardial infarction: Secondary | ICD-10-CM | POA: Diagnosis not present

## 2016-02-06 DIAGNOSIS — I213 ST elevation (STEMI) myocardial infarction of unspecified site: Secondary | ICD-10-CM

## 2016-02-06 NOTE — Patient Outreach (Signed)
[place] [facility]) Care Management  02/06/2016  Henry Franklin. September 26, 1926 [id number]   REFERRAL SOURCE;  [employer] team advantage/ silverback referral REFERRAL REASON:  Recent discharge from hospital for Myocardial infarction. On multiple medications, Currently in outpatient rehabilitation with plans to start cardiac rehab after completion of physical therapy. Patient could benefit from ongoing health education related to new medications and new diagnosis of myocardial infarction  SUBJECTIVE; Telephone call to patient for follow up.  Person answering phone states patient is not at home and is out for treatment.  HIPAA compliant message left with call back phone number.   PLAN; RNCM will attempt to contact patient within 1 week.  Quinn Plowman RN,BSN,CCM [facility] Telephonic  [phone]

## 2016-02-07 ENCOUNTER — Other Ambulatory Visit: Payer: Self-pay

## 2016-02-07 NOTE — Patient Outreach (Signed)
[place] [facility]) Care Management  02/07/2016  Henry Franklin. 31-Oct-1926 [id number]  REFERRAL SOURCE;  Health team advantage/ silverback referral REFERRAL REASON:  Recent discharge from hospital for Myocardial infarction. On multiple medications, Currently in outpatient rehabilitation with plans to start cardiac rehab after completion of physical therapy. Patient could benefit from ongoing health education related to new medications and new diagnosis of myocardial infarction  SUBJECTIVE; Telephone call to patient for follow up.  HIPAA verified with patient. Patient states he is doing well.  Patient reports he has been going to cardiac rehab 3 times per week.  States he has had 5 visits so far.  Patient states he can tell he is getting stronger and feels better.  Patient states he is glad he choose to go to rehab.  Patient states he continues to adhere to a low salt diet.  RNCM discussed with patient low salt diet choices and foods to avoid.  RNCM offered suggestion of using Mrs. Dash on foods for added seasoning.  Patient denies having any unusual symptoms.  RNCM discussed with patient signs/ symptoms of heart attach.  Patient states he continues to take his medications as prescribed. Patient states he followed up with Richardson Dopp, PA with his cardiologist as scheduled. Patient denies any changes in his treatment.  RNCM discussed with patient symptoms of bleeding due to being on plavix/ eliquis. EMMI education material sent to patient on Plavix for his review.  Patient denies any recent falls.  RNCM advised patient to use his walker as instructed by rehab for stability and safety. Patient verbalized understanding.  Patient verbally agreed to next telephone follow up with RNCM.    ASSESSMENT:  Patient attending cardiac rehab. Patient needs ongoing review of heart attack disease process/ symptoms, bleeding symptoms due to blood thinners, low sodium diet  PLAN:  RNCM will follow up with  patient within the month of October 2017. RNCM will mail patient EMMI  Education material on heart attack and plavix. Patient will report at least 3 symptoms of heart attack and salty foods to avoid.   Quinn Plowman RN,BSN,CCM [facility] Telephonic  [phone]

## 2016-02-08 ENCOUNTER — Encounter (HOSPITAL_COMMUNITY)
Admission: RE | Admit: 2016-02-08 | Discharge: 2016-02-08 | Disposition: A | Discharge status: Home / Self Care | Payer: PPO | Source: Ambulatory Visit | Attending: Cardiovascular Disease | Admitting: Cardiovascular Disease

## 2016-02-08 ENCOUNTER — Encounter (HOSPITAL_COMMUNITY): Payer: PPO

## 2016-02-08 DIAGNOSIS — I252 Old myocardial infarction: Secondary | ICD-10-CM | POA: Diagnosis not present

## 2016-02-08 DIAGNOSIS — I213 ST elevation (STEMI) myocardial infarction of unspecified site: Secondary | ICD-10-CM

## 2016-02-08 DIAGNOSIS — Z955 Presence of coronary angioplasty implant and graft: Secondary | ICD-10-CM

## 2016-02-08 NOTE — Progress Notes (Signed)
Reviewed home exercise with pt today.  Pt plans to short walking bout 10-15 minutes, 2-3x/day for exercise. In addition, pt will do hip strengthening exercises to build on core function and balance  Reviewed THR, pulse, RPE, sign and symptoms, NTG use, and when to call 911 or MD.  Also discussed weather considerations and indoor options.  Pt voiced understanding.     Henry Franklin [employer]

## 2016-02-11 ENCOUNTER — Encounter (HOSPITAL_COMMUNITY): Payer: PPO

## 2016-02-11 ENCOUNTER — Encounter (HOSPITAL_COMMUNITY)
Admission: RE | Admit: 2016-02-11 | Discharge: 2016-02-11 | Disposition: A | Discharge status: Home / Self Care | Payer: PPO | Source: Ambulatory Visit | Attending: Cardiovascular Disease | Admitting: Cardiovascular Disease

## 2016-02-11 DIAGNOSIS — I252 Old myocardial infarction: Secondary | ICD-10-CM | POA: Diagnosis not present

## 2016-02-11 DIAGNOSIS — Z955 Presence of coronary angioplasty implant and graft: Secondary | ICD-10-CM

## 2016-02-11 DIAGNOSIS — I213 ST elevation (STEMI) myocardial infarction of unspecified site: Secondary | ICD-10-CM

## 2016-02-13 ENCOUNTER — Encounter (HOSPITAL_COMMUNITY)
Admission: RE | Admit: 2016-02-13 | Discharge: 2016-02-13 | Disposition: A | Discharge status: Home / Self Care | Payer: PPO | Source: Ambulatory Visit | Attending: Cardiovascular Disease | Admitting: Cardiovascular Disease

## 2016-02-13 ENCOUNTER — Encounter (HOSPITAL_COMMUNITY): Payer: PPO

## 2016-02-13 DIAGNOSIS — Z955 Presence of coronary angioplasty implant and graft: Secondary | ICD-10-CM

## 2016-02-13 DIAGNOSIS — I213 ST elevation (STEMI) myocardial infarction of unspecified site: Secondary | ICD-10-CM

## 2016-02-13 DIAGNOSIS — I252 Old myocardial infarction: Secondary | ICD-10-CM | POA: Diagnosis not present

## 2016-02-15 ENCOUNTER — Ambulatory Visit: Payer: PPO | Admitting: Cardiovascular Disease

## 2016-02-15 ENCOUNTER — Encounter (HOSPITAL_COMMUNITY): Payer: PPO

## 2016-02-15 ENCOUNTER — Encounter (HOSPITAL_COMMUNITY)
Admission: RE | Admit: 2016-02-15 | Discharge: 2016-02-15 | Disposition: A | Discharge status: Home / Self Care | Payer: PPO | Source: Ambulatory Visit | Attending: Cardiovascular Disease | Admitting: Cardiovascular Disease

## 2016-02-15 ENCOUNTER — Encounter (HOSPITAL_COMMUNITY): Payer: Self-pay

## 2016-02-15 DIAGNOSIS — I252 Old myocardial infarction: Secondary | ICD-10-CM | POA: Diagnosis not present

## 2016-02-15 DIAGNOSIS — I213 ST elevation (STEMI) myocardial infarction of unspecified site: Secondary | ICD-10-CM

## 2016-02-15 DIAGNOSIS — Z955 Presence of coronary angioplasty implant and graft: Secondary | ICD-10-CM

## 2016-02-15 NOTE — Progress Notes (Signed)
Cardiac Individual Treatment Plan  Patient Details  Name: Ein Budz. MRN: [id number] Date of Birth: 08-Aug-1926 Referring Provider:   Flowsheet Row CARDIAC REHAB PHASE II EXERCISE from 01/28/2016 in [place]  Referring Provider  [place]      Initial Encounter Date:  [place] PHASE II EXERCISE from 01/28/2016 in [place]  Date  01/29/16  Referring Provider  [place]      Visit Diagnosis: ST elevation myocardial infarction (STEMI), unspecified artery ([place])  Stented coronary artery  Patient's Home Medications on Admission:  Current Outpatient Prescriptions:  .  apixaban (ELIQUIS) 5 MG TABS tablet, Take 1 tablet (5 mg total) by mouth 2 (two) times daily., Disp: 180 tablet, Rfl: 3 .  atorvastatin (LIPITOR) 80 MG tablet, Take 1 tablet (80 mg total) by mouth daily at 6 PM., Disp: 30 tablet, Rfl: 11 .  Cholecalciferol (VITAMIN D-3) 1000 UNITS CAPS, Take 1 capsule by mouth daily. Take one tablet by mouth once daily., Disp: , Rfl:  .  clopidogrel (PLAVIX) 75 MG tablet, Take 1 tablet (75 mg total) by mouth daily with breakfast., Disp: 30 tablet, Rfl: 11 .  lisinopril (PRINIVIL,ZESTRIL) 2.5 MG tablet, Take 1 tablet (2.5 mg total) by mouth daily., Disp: 30 tablet, Rfl: 11 .  Multiple Vitamin (MULTIVITAMIN) tablet, Take 1 tablet by mouth daily., Disp: , Rfl:  .  nitroGLYCERIN (NITROSTAT) 0.4 MG SL tablet, Place 1 tablet (0.4 mg total) under the tongue every 5 (five) minutes x 3 doses as needed for chest pain., Disp: 25 tablet, Rfl: 3  Past Medical History: Past Medical History:  Diagnosis Date  . CAD (coronary artery disease)    a. STEMI 6/17: LHC - oLAD 10, mLAD 50, dLAD 20, D2 inf subbranch 70 + sup subbranch 100, pRCA 20, oRPDA 40, EF 35-45% >> PCI:  2.25 x 12 mm resolute integrity DES to the superior subbranch of D2   . Essential and other specified forms of tremor   . External hemorrhoids without  mention of complication   . History of ST elevation myocardial infarction (STEMI)    a. Ant-Lat STEMI >> DES to D2 subbranch  . History of stroke   . History of TIA (transient ischemic attack)   . HTN (hypertension)   . Hyperlipidemia   . Ischemic cardiomyopathy    a. Echo 6/17: Mid and distal anterior/inferior, septal and apical HK, mild concentric LVH, EF 30-35%, mild LAE  . Lumbago   . Malignant neoplasm of prostate ([place])   . Nontraumatic rupture of tendons of biceps (long head)   . Paroxysmal atrial fibrillation (H[facility])    during admit for STEMI 6/17 >> triple Rx high risk >> ASA + Plavix x 30 days post MI, then Eliquis + Plavix  . Sebaceous cyst   . Supraventricular premature beats   . Unspecified anomaly of tooth position     Tobacco Use: History  Smoking Status  . Never Smoker  Smokeless Tobacco  . Never Used    Labs: Recent Review Flowsheet Data    Labs for ITP Cardiac and Pulmonary Rehab Latest Ref Rng & Units 07/03/2014 11/19/2015 11/20/2015 12/05/2015 01/14/2016   Cholestrol 125 - 200 mg/dL 174 152 113 91(L) 108(L)   LDLCALC <130 mg/dL 100(H) 76 56 36 51   HDL >=40 mg/dL 52 48 36(L) 46 42   Trlycerides <150 mg/dL 111 139 107 47 73   TCO2 0 - 100 mmol/L - 31 - - -  Capillary Blood Glucose: No results found for: GLUCAP   Exercise Target Goals:    Exercise Program Goal: Individual exercise prescription set with THRR, safety & activity barriers. Participant demonstrates ability to understand and report RPE using BORG scale, to self-measure pulse accurately, and to acknowledge the importance of the exercise prescription.  Exercise Prescription Goal: Starting with aerobic activity 30 plus minutes a day, 3 days per week for initial exercise prescription. Provide home exercise prescription and guidelines that participant acknowledges understanding prior to discharge.  Activity Barriers & Risk Stratification:     Activity Barriers & Cardiac Risk Stratification -  01/22/16 1203      Activity Barriers & Cardiac Risk Stratification   Cardiac Risk Stratification High      6 Minute Walk:     6 Minute Walk    Row Name 01/28/16 1400 01/29/16 0838       6 Minute Walk   Phase Initial  -    Distance 1281 feet -    Walk Time 6 minutes -    MPH 2.42 -    METS 2.2 -    RPE 11 -    VO2 Peak 7.72  -    Resting HR 48 bpm -    Resting BP 108/70 -    Max Ex. HR 82 bpm -    Max Ex. BP 138/76 -    2 Minute Post BP 124/60 -       Initial Exercise Prescription:     Initial Exercise Prescription - 01/29/16 0800      Date of Initial Exercise RX and Referring Provider   Date 01/29/16   Referring Provider [place]     NuStep   Level 1   Minutes 10   METs 2     Arm Ergometer   Level 1   Watts 23   Minutes 10     Track   Laps 7   Minutes 10     Prescription Details   Frequency (times per week) 3   Duration Progress to 30 minutes of continuous aerobic without signs/symptoms of physical distress     Intensity   THRR 40-80% of Max Heartrate 53-106   Ratings of Perceived Exertion 11-13     Progression   Progression Continue to progress workloads to maintain intensity without signs/symptoms of physical distress.     Resistance Training   Training Prescription Yes   Weight 2   Reps 10-12      Perform Capillary Blood Glucose checks as needed.  Exercise Prescription Changes:     Exercise Prescription Changes    Row Name 02/11/16 1600             Exercise Review   Progression Yes         Response to Exercise   Blood Pressure (Admit) 130/70       Blood Pressure (Exercise) 120/70       Blood Pressure (Exit) 104/60       Heart Rate (Admit) 59 bpm       Heart Rate (Exercise) 86 bpm       Heart Rate (Exit) 68 bpm       Rating of Perceived Exertion (Exercise) 12       Comments Reviewed HEP on 02/08/16       Duration Progress to 30 minutes of continuous aerobic without signs/symptoms of physical distress       Intensity THRR  unchanged         Progression  Average METs 2.3         Resistance Training   Training Prescription Yes       Weight 2       Reps 10-12         NuStep   Level 3       Minutes 10       METs 2.3         Arm Ergometer   Level 1       Watts 23       Minutes 10         Track   Laps 7       Minutes 10         Home Exercise Plan   Plans to continue exercise at Home  Reviewed on 02/08/16. See progress note       Frequency Add 2 additional days to program exercise sessions.          Exercise Comments:     Exercise Comments    Row Name 02/11/16 1659           Exercise Comments Reviewed METs and goals. Pt is tolerating exercise fairly well; will continue to monitor exercise progression.          Discharge Exercise Prescription (Final Exercise Prescription Changes):     Exercise Prescription Changes - 02/11/16 1600      Exercise Review   Progression Yes     Response to Exercise   Blood Pressure (Admit) 130/70   Blood Pressure (Exercise) 120/70   Blood Pressure (Exit) 104/60   Heart Rate (Admit) 59 bpm   Heart Rate (Exercise) 86 bpm   Heart Rate (Exit) 68 bpm   Rating of Perceived Exertion (Exercise) 12   Comments Reviewed HEP on 02/08/16   Duration Progress to 30 minutes of continuous aerobic without signs/symptoms of physical distress   Intensity THRR unchanged     Progression   Average METs 2.3     Resistance Training   Training Prescription Yes   Weight 2   Reps 10-12     NuStep   Level 3   Minutes 10   METs 2.3     Arm Ergometer   Level 1   Watts 23   Minutes 10     Track   Laps 7   Minutes 10     Home Exercise Plan   Plans to continue exercise at [place] on 02/08/16. See progress note   Frequency Add 2 additional days to program exercise sessions.      Nutrition:  Target Goals: Understanding of nutrition guidelines, daily intake of sodium 1500mg , cholesterol 200mg , calories 30% from fat and 7% or less from saturated fats,  daily to have 5 or more servings of fruits and vegetables.  Biometrics:     Pre Biometrics - 01/30/16 1630      Pre Biometrics   Height 5' 8.75" (1.746 m)   Weight 143 lb 15.4 oz (65.3 kg)   Waist Circumference 32.5 inches   Hip Circumference 37.5 inches   Waist to Hip Ratio 0.87 %   BMI (Calculated) 21.5   Triceps Skinfold 8 mm   % Body Fat 19.8 %   Grip Strength 26 kg   Flexibility 0 in   Single Leg Stand 0 seconds       Nutrition Therapy Plan and Nutrition Goals:     Nutrition Therapy & Goals - 01/30/16 0936      Nutrition Therapy  Diet High Calorie, High Protein     Personal Nutrition Goals   Personal Goal #1 1-2 lb wt gain to wt gain goal of 6-12 lb at graduation from [facility], educate and counsel regarding individualized specific dietary modifications aiming towards targeted core components such as weight, hypertension, lipid management, diabetes, heart failure and other comorbidities.   Expected Outcomes Short Term Goal: Understand basic principles of dietary content, such as calories, fat, sodium, cholesterol and nutrients.;Long Term Goal: Adherence to prescribed nutrition plan.      Nutrition Discharge: Nutrition Scores:     Nutrition Assessments - 01/30/16 0936      MEDFICTS Scores   Pre Score 24      Nutrition Goals Re-Evaluation:   Psychosocial: Target Goals: Acknowledge presence or absence of depression, maximize coping skills, provide positive support system. Participant is able to verbalize types and ability to use techniques and skills needed for reducing stress and depression.  Initial Review & Psychosocial Screening:     Initial Psych Review & Screening - 01/29/16 [place]? Yes     Barriers   Psychosocial barriers to participate in program There are no identifiable barriers or psychosocial needs.     Screening Interventions   Interventions  Encouraged to exercise      Quality of Life Scores:     Quality of Life - 01/30/16 1638      Quality of Life Scores   Health/Function Pre 21.61 %   Socioeconomic Pre 21.75 %   Psych/Spiritual Pre 24 %   Family Pre 20.5 %   GLOBAL Pre 21.78 %      PHQ-9: Recent Review Flowsheet Data    Depression screen [facility] 2/9 01/02/2016 12/18/2015 07/11/2015 05/31/2014 04/05/2014   Decreased Interest 0 0 0 0 0   Down, Depressed, Hopeless 0 0 0 0 0   PHQ - 2 Score 0 0 0 0 0      Psychosocial Evaluation and Intervention:   Psychosocial Re-Evaluation:     Psychosocial Re-Evaluation    Row Name 02/15/16 1421             Psychosocial Re-Evaluation   Interventions Encouraged to attend Cardiac Rehabilitation for the exercise       Comments no psychosocial needs identified, no psychosocial interventions necessary.  back pain limits activity.  pt is eager to return to his previous level of exertion.        Continued Psychosocial Services Needed No          Vocational Rehabilitation: Provide vocational rehab assistance to qualifying candidates.   Vocational Rehab Evaluation & Intervention:     Vocational Rehab - 01/29/16 0850      Initial Vocational Rehab Evaluation & Intervention   Assessment shows need for Vocational Rehabilitation No      Education: Education Goals: Education classes will be provided on a weekly basis, covering required topics. Participant will state understanding/return demonstration of topics presented.  Learning Barriers/Preferences:     Learning Barriers/Preferences - 01/22/16 1203      Learning Barriers/Preferences   Learning Barriers Sight   Learning Preferences Group Instruction;Individual Instruction;Verbal Instruction;Written Material      Education Topics: Count Your Pulse:  -Group instruction provided by verbal instruction, demonstration, patient participation and written materials to support subject.  Instructors address importance of being  able to find your pulse and how to count your pulse  when at home without a heart monitor.  Patients get hands on experience counting their pulse with staff help and individually. Flowsheet Row CARDIAC REHAB PHASE II EXERCISE from 02/11/2016 in [place]  Date  02/01/16  Educator  Barnet Pall, RN  Instruction Review Code  2- meets goals/outcomes      Heart Attack, Angina, and Risk Factor Modification:  -Group instruction provided by verbal instruction, video, and written materials to support subject.  Instructors address signs and symptoms of angina and heart attacks.    Also discuss risk factors for heart disease and how to make changes to improve heart health risk factors.   Functional Fitness:  -Group instruction provided by verbal instruction, demonstration, patient participation, and written materials to support subject.  Instructors address safety measures for doing things around the house.  Discuss how to get up and down off the floor, how to pick things up properly, how to safely get out of a chair without assistance, and balance training.   Meditation and Mindfulness:  -Group instruction provided by verbal instruction, patient participation, and written materials to support subject.  Instructor addresses importance of mindfulness and meditation practice to help reduce stress and improve awareness.  Instructor also leads participants through a meditation exercise.    Stretching for Flexibility and Mobility:  -Group instruction provided by verbal instruction, patient participation, and written materials to support subject.  Instructors lead participants through series of stretches that are designed to increase flexibility thus improving mobility.  These stretches are additional exercise for major muscle groups that are typically performed during regular warm up and cool down.   Hands Only CPR Anytime:  -Group instruction provided by verbal  instruction, video, patient participation and written materials to support subject.  Instructors co-teach with AHA video for hands only CPR.  Participants get hands on experience with mannequins.   Nutrition I class: Heart Healthy Eating:  -Group instruction provided by PowerPoint slides, verbal discussion, and written materials to support subject matter. The instructor gives an explanation and review of the Therapeutic Lifestyle Changes diet recommendations, which includes a discussion on lipid goals, dietary fat, sodium, fiber, plant stanol/sterol esters, sugar, and the components of a well-balanced, healthy diet. Flowsheet Row CARDIAC REHAB PHASE II EXERCISE from 02/11/2016 in [place]  Date  02/05/16  Educator  RD  Instruction Review Code  2- meets goals/outcomes      Nutrition II class: Lifestyle Skills:  -Group instruction provided by PowerPoint slides, verbal discussion, and written materials to support subject matter. The instructor gives an explanation and review of label reading, grocery shopping for heart health, heart healthy recipe modifications, and ways to make healthier choices when eating out. 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 The instructor gives an explanation and review of diabetes co-morbidities, pre- and post-prandial blood glucose goals, pre-exercise blood glucose goals, signs, symptoms, and treatment of hypoglycemia and hyperglycemia, and foot care basics.   Diabetes Blitz:  -Group instruction provided by PowerPoint slides, verbal discussion, and written materials to  support subject matter. The instructor gives an explanation and review of the physiology behind type 1 and  type 2 diabetes, diabetes medications and rational behind using different medications, pre- and post-prandial blood glucose recommendations and Hemoglobin A1c goals, diabetes diet, and exercise including blood glucose guidelines for exercising safely.    Portion Distortion:  -Group instruction provided by PowerPoint slides, verbal discussion, written materials, and food models to support subject matter. The instructor gives an explanation of serving size versus portion size, changes in portions sizes over the last 20 years, and what consists of a serving from each food group.   Stress Management:  -Group instruction provided by verbal instruction, video, and written materials to support subject matter.  Instructors review role of stress in heart disease and how to cope with stress positively.     Exercising on Your Own:  -Group instruction provided by verbal instruction, power point, and written materials to support subject.  Instructors discuss benefits of exercise, components of exercise, frequency and intensity of exercise, and end points for exercise.  Also discuss use of nitroglycerin and activating EMS.  Review options of places to exercise outside of rehab.  Review guidelines for sex with heart disease.   Cardiac Drugs I:  -Group instruction provided by verbal instruction and written materials to support subject.  Instructor reviews cardiac drug classes: antiplatelets, anticoagulants, beta blockers, and statins.  Instructor discusses reasons, side effects, and lifestyle considerations for each drug class.   Cardiac Drugs II:  -Group instruction provided by verbal instruction and written materials to support subject.  Instructor reviews cardiac drug classes: angiotensin converting enzyme inhibitors (ACE-I), angiotensin II receptor blockers (ARBs), nitrates, and calcium channel  blockers.  Instructor discusses reasons, side effects, and lifestyle considerations for each drug class.   Anatomy and Physiology of the Circulatory System:  -Group instruction provided by verbal instruction, video, and written materials to support subject.  Reviews functional anatomy of heart, how it relates to various diagnoses, and what role the heart plays in the overall system. Flowsheet Row CARDIAC REHAB PHASE II EXERCISE from 02/11/2016 in [place]  Date  02/06/16  Instruction Review Code  2- meets goals/outcomes      Knowledge Questionnaire Score:     Knowledge Questionnaire Score - 01/30/16 1638      Knowledge Questionnaire Score   Pre Score 21/24      Core Components/Risk Factors/Patient Goals at Admission:     Personal Goals and Risk Factors at Admission - 01/22/16 1204      Core Components/Risk Factors/Patient Goals on Admission   Sedentary Yes   Intervention Provide advice, education, support and counseling about physical activity/exercise needs.;Develop an individualized exercise prescription for aerobic and resistive training based on initial evaluation findings, risk stratification, comorbidities and participant's personal goals.   Expected Outcomes Achievement of increased cardiorespiratory fitness and enhanced flexibility, muscular endurance and strength shown through measurements of functional capacity and personal statement of participant.   Increase Strength and Stamina Yes   Intervention Provide advice, education, support and counseling about physical activity/exercise needs.;Develop an individualized exercise prescription for aerobic and resistive training based on initial evaluation findings, risk stratification, comorbidities and participant's personal goals.   Expected Outcomes Achievement of increased cardiorespiratory fitness and enhanced flexibility, muscular endurance and strength shown through measurements of functional  capacity and personal statement of participant.   Hypertension Yes   Intervention Provide education on lifestyle modifcations including regular physical activity/exercise, weight management, moderate sodium restriction and increased consumption of fresh fruit, vegetables, and low fat dairy, alcohol moderation, and smoking cessation.;Monitor prescription use compliance.  Expected Outcomes Short Term: Continued assessment and intervention until BP is < 140/26mm HG in hypertensive participants. < 130/48mm HG in hypertensive participants with diabetes, heart failure or chronic kidney disease.;Long Term: Maintenance of blood pressure at goal levels.   Lipids Yes   Intervention Provide education and support for participant on nutrition & aerobic/resistive exercise along with prescribed medications to achieve LDL 70mg , HDL >40mg .   Expected Outcomes Short Term: Participant states understanding of desired cholesterol values and is compliant with medications prescribed. Participant is following exercise prescription and nutrition guidelines.;Long Term: Cholesterol controlled with medications as prescribed, with individualized exercise RX and with personalized nutrition plan. Value goals: LDL < 70mg , HDL > 40 mg.      Core Components/Risk Factors/Patient Goals Review:      Goals and Risk Factor Review    Row Name 02/11/16 1702             Core Components/Risk Factors/Patient Goals Review   Personal Goals Review Other;Increase Strength and Stamina       Review Reviewed HEP in which would improve cardiovascular endurance and strength       Expected Outcomes Pt will maintain or increase muscle mass and continue to improve in balance/stability          Core Components/Risk Factors/Patient Goals at Discharge (Final Review):      Goals and Risk Factor Review - 02/11/16 1702      Core Components/Risk Factors/Patient Goals Review   Personal Goals Review Other;Increase Strength and Stamina   Review  Reviewed HEP in which would improve cardiovascular endurance and strength   Expected Outcomes Pt will maintain or increase muscle mass and continue to improve in balance/stability      ITP Comments:     ITP Comments    Row Name 01/22/16 1151           ITP Comments Dr. Fransico Him, Medical Director          Comments: Pt is making expected progress toward personal goals after completing 9 sessions. Recommend continued exercise and life style modification education including  stress management and relaxation techniques to decrease cardiac risk profile.

## 2016-02-18 ENCOUNTER — Encounter (HOSPITAL_COMMUNITY): Payer: PPO

## 2016-02-18 ENCOUNTER — Encounter (HOSPITAL_COMMUNITY)
Admission: RE | Admit: 2016-02-18 | Discharge: 2016-02-18 | Disposition: A | Discharge status: Home / Self Care | Payer: PPO | Source: Ambulatory Visit | Attending: Cardiovascular Disease | Admitting: Cardiovascular Disease

## 2016-02-18 DIAGNOSIS — I213 ST elevation (STEMI) myocardial infarction of unspecified site: Secondary | ICD-10-CM

## 2016-02-18 DIAGNOSIS — I252 Old myocardial infarction: Secondary | ICD-10-CM | POA: Diagnosis not present

## 2016-02-18 DIAGNOSIS — Z955 Presence of coronary angioplasty implant and graft: Secondary | ICD-10-CM

## 2016-02-20 ENCOUNTER — Other Ambulatory Visit: Payer: Self-pay | Admitting: Physician Assistant

## 2016-02-20 ENCOUNTER — Encounter (HOSPITAL_COMMUNITY): Payer: PPO

## 2016-02-20 ENCOUNTER — Ambulatory Visit (HOSPITAL_COMMUNITY): Payer: PPO | Attending: Cardiology

## 2016-02-20 ENCOUNTER — Other Ambulatory Visit: Payer: Self-pay

## 2016-02-20 ENCOUNTER — Encounter (HOSPITAL_COMMUNITY)
Admission: RE | Admit: 2016-02-20 | Discharge: 2016-02-20 | Disposition: A | Discharge status: Home / Self Care | Payer: PPO | Source: Ambulatory Visit | Attending: Cardiovascular Disease | Admitting: Cardiovascular Disease

## 2016-02-20 DIAGNOSIS — I071 Rheumatic tricuspid insufficiency: Secondary | ICD-10-CM | POA: Insufficient documentation

## 2016-02-20 DIAGNOSIS — I2109 ST elevation (STEMI) myocardial infarction involving other coronary artery of anterior wall: Secondary | ICD-10-CM

## 2016-02-20 DIAGNOSIS — I213 ST elevation (STEMI) myocardial infarction of unspecified site: Secondary | ICD-10-CM

## 2016-02-20 DIAGNOSIS — E785 Hyperlipidemia, unspecified: Secondary | ICD-10-CM | POA: Diagnosis not present

## 2016-02-20 DIAGNOSIS — I255 Ischemic cardiomyopathy: Secondary | ICD-10-CM | POA: Insufficient documentation

## 2016-02-20 DIAGNOSIS — I10 Essential (primary) hypertension: Secondary | ICD-10-CM | POA: Diagnosis not present

## 2016-02-20 DIAGNOSIS — I2510 Atherosclerotic heart disease of native coronary artery without angina pectoris: Secondary | ICD-10-CM | POA: Insufficient documentation

## 2016-02-20 DIAGNOSIS — I480 Paroxysmal atrial fibrillation: Secondary | ICD-10-CM | POA: Insufficient documentation

## 2016-02-20 DIAGNOSIS — Z955 Presence of coronary angioplasty implant and graft: Secondary | ICD-10-CM

## 2016-02-20 DIAGNOSIS — I252 Old myocardial infarction: Secondary | ICD-10-CM | POA: Diagnosis not present

## 2016-02-22 ENCOUNTER — Encounter (HOSPITAL_COMMUNITY): Payer: PPO

## 2016-02-22 ENCOUNTER — Telehealth (HOSPITAL_COMMUNITY): Payer: Self-pay | Admitting: *Deleted

## 2016-02-22 ENCOUNTER — Telehealth: Payer: Self-pay | Admitting: Physician Assistant

## 2016-02-22 ENCOUNTER — Encounter (HOSPITAL_COMMUNITY): Admission: RE | Admit: 2016-02-22 | Payer: PPO | Source: Ambulatory Visit

## 2016-02-22 NOTE — Telephone Encounter (Signed)
Pt notified echo results have not yet been read yet. Once read I will cb with the results. Pt thanked me for my call to let him know test not read yet.

## 2016-02-22 NOTE — Telephone Encounter (Signed)
New message   Pt verbalized that he is calling for the results of his ECHO 02-20-16

## 2016-02-25 ENCOUNTER — Telehealth: Payer: Self-pay | Admitting: *Deleted

## 2016-02-25 ENCOUNTER — Encounter (HOSPITAL_COMMUNITY): Payer: PPO

## 2016-02-25 ENCOUNTER — Encounter: Payer: Self-pay | Admitting: Physician Assistant

## 2016-02-25 ENCOUNTER — Encounter (HOSPITAL_COMMUNITY)
Admission: RE | Admit: 2016-02-25 | Discharge: 2016-02-25 | Disposition: A | Discharge status: Home / Self Care | Payer: PPO | Source: Ambulatory Visit | Attending: Cardiovascular Disease | Admitting: Cardiovascular Disease

## 2016-02-25 DIAGNOSIS — I213 ST elevation (STEMI) myocardial infarction of unspecified site: Secondary | ICD-10-CM

## 2016-02-25 DIAGNOSIS — I252 Old myocardial infarction: Secondary | ICD-10-CM | POA: Diagnosis not present

## 2016-02-25 DIAGNOSIS — Z955 Presence of coronary angioplasty implant and graft: Secondary | ICD-10-CM

## 2016-02-25 NOTE — Telephone Encounter (Signed)
Lmtcb to go over echo results.  

## 2016-02-25 NOTE — Telephone Encounter (Signed)
DPR for pt's wife Arbie Cookey. Wife has been notified of pt's echo results and findings by phone with verbal understanding. Will fax to PCP.

## 2016-02-26 ENCOUNTER — Other Ambulatory Visit: Payer: Self-pay

## 2016-02-26 NOTE — Patient Outreach (Signed)
[facility]) Care Management  02/26/2016  Henry Franklin. 1927/02/09 [id number]   silverback referral REFERRAL REASON: Recent discharge from hospital for Myocardial infarction. On multiple medications, Currently in outpatient rehabilitation with plans to start cardiac rehab after completion of physical therapy. Patient could benefit from ongoing health education related to new medications and new diagnosis of myocardial infarction  SUBJECTIVE: RNCM returned patients call.  HIPAA verified with patient.  Patient state he had a question concerning a letter he received from his health team advantage insurance. Patient states the letter was an approval letter for a "doppler" study. Patient states he was unsure what the test was for.  Patient state she did not know anything about it. Patient reports he did not have the letter present at time of call with RNCM. RNCM advised patient to obtain letter and contact his primary MD office regarding the test.  RNCM also advised patient that he could contact the phone number listed on the letter to seek additional information.  Patient inquired about the silver/ fit program he has with his health team advantage insurance. RNCM informed patient that per the health team advantage benefit brochure it appears that he has the silver/fit benefit with his insurance plan. RNCM advised patient to contact his insurance company to confirm.  RNCM gave patient contact phone number for health team advantage questions. Also advised patient that he could contact the phone number listed on his insurance card.  Patient verbalized understanding and appreciation.   ASSESSMENT: Patient continues to need case management follow up.  PLAN; RNCM will follow up with patient at next scheduled outreach.    Quinn Plowman RN,BSN,CCM [facility] Telephonic  [phone]

## 2016-02-27 ENCOUNTER — Encounter (HOSPITAL_COMMUNITY)
Admission: RE | Admit: 2016-02-27 | Discharge: 2016-02-27 | Disposition: A | Discharge status: Home / Self Care | Payer: PPO | Source: Ambulatory Visit | Attending: Cardiovascular Disease | Admitting: Cardiovascular Disease

## 2016-02-27 ENCOUNTER — Encounter (HOSPITAL_COMMUNITY): Payer: PPO

## 2016-02-27 DIAGNOSIS — Z955 Presence of coronary angioplasty implant and graft: Secondary | ICD-10-CM

## 2016-02-27 DIAGNOSIS — I213 ST elevation (STEMI) myocardial infarction of unspecified site: Secondary | ICD-10-CM

## 2016-02-27 DIAGNOSIS — I252 Old myocardial infarction: Secondary | ICD-10-CM | POA: Diagnosis not present

## 2016-02-29 ENCOUNTER — Encounter (HOSPITAL_COMMUNITY): Payer: PPO

## 2016-02-29 ENCOUNTER — Encounter (HOSPITAL_COMMUNITY)
Admission: RE | Admit: 2016-02-29 | Discharge: 2016-02-29 | Disposition: A | Discharge status: Home / Self Care | Payer: PPO | Source: Ambulatory Visit | Attending: Cardiovascular Disease | Admitting: Cardiovascular Disease

## 2016-02-29 DIAGNOSIS — I252 Old myocardial infarction: Secondary | ICD-10-CM | POA: Diagnosis not present

## 2016-02-29 DIAGNOSIS — Z955 Presence of coronary angioplasty implant and graft: Secondary | ICD-10-CM

## 2016-02-29 DIAGNOSIS — I213 ST elevation (STEMI) myocardial infarction of unspecified site: Secondary | ICD-10-CM

## 2016-03-03 ENCOUNTER — Encounter (HOSPITAL_COMMUNITY)
Admission: RE | Admit: 2016-03-03 | Discharge: 2016-03-03 | Disposition: A | Discharge status: Home / Self Care | Payer: PPO | Source: Ambulatory Visit | Attending: Cardiovascular Disease | Admitting: Cardiovascular Disease

## 2016-03-03 ENCOUNTER — Encounter (HOSPITAL_COMMUNITY): Payer: PPO

## 2016-03-03 DIAGNOSIS — I10 Essential (primary) hypertension: Secondary | ICD-10-CM | POA: Insufficient documentation

## 2016-03-03 DIAGNOSIS — Z955 Presence of coronary angioplasty implant and graft: Secondary | ICD-10-CM | POA: Diagnosis not present

## 2016-03-03 DIAGNOSIS — I255 Ischemic cardiomyopathy: Secondary | ICD-10-CM | POA: Diagnosis not present

## 2016-03-03 DIAGNOSIS — I480 Paroxysmal atrial fibrillation: Secondary | ICD-10-CM | POA: Insufficient documentation

## 2016-03-03 DIAGNOSIS — I2510 Atherosclerotic heart disease of native coronary artery without angina pectoris: Secondary | ICD-10-CM | POA: Insufficient documentation

## 2016-03-03 DIAGNOSIS — I252 Old myocardial infarction: Secondary | ICD-10-CM | POA: Diagnosis not present

## 2016-03-03 DIAGNOSIS — E785 Hyperlipidemia, unspecified: Secondary | ICD-10-CM | POA: Diagnosis not present

## 2016-03-03 DIAGNOSIS — I213 ST elevation (STEMI) myocardial infarction of unspecified site: Secondary | ICD-10-CM

## 2016-03-03 NOTE — Progress Notes (Addendum)
Henry Franklin 82 y.o. male Nutrition Note Spoke with pt. Pt is at a normal weight but is under his UBW of 153-158 lb. Nutrition Survey reviewed with pt. Pt is following Step 2 of the Therapeutic Lifestyle Changes diet. Liberalizing pt diet to promote wt gain discussed. Age-appropriate nutrition recommendations reviewed. Pt expressed understanding of the information reviewed. Pt aware of nutrition education classes offered and has attended 2 nutrition classes. No results found for: HGBA1C Wt Readings from Last 3 Encounters:  01/30/16 143 lb 15.4 oz (65.3 kg)  01/03/16 143 lb 12.8 oz (65.2 kg)  01/02/16 144 lb (65.3 kg)   Nutrition Diagnosis Food-and nutrition-related knowledge deficit related to lack of exposure to information as related to diagnosis of: ? CVD  Nutrition Intervention ? Benefits of adopting Age-appropriate Therapeutic Lifestyle Changes discussed when Medficts reviewed. ? Pt to attend the Portion Distortion class ? Pt to attend the  ? Nutrition I class - met 02/05/16                     ? Nutrition II class - met 02/12/16 ? Continue client-centered nutrition education by RD, as part of interdisciplinary care.  Goal(s) Encourage 1-2 lb wt gain/week to a wt gain goal of 6-12 lb at graduation Monitor and Evaluate progress toward nutrition goal with team.  Derek Mound, M.Ed, RD, LDN, CDE 03/03/2016 4:02 PM

## 2016-03-05 ENCOUNTER — Encounter: Payer: Self-pay | Admitting: Internal Medicine

## 2016-03-05 ENCOUNTER — Encounter (HOSPITAL_COMMUNITY): Payer: PPO

## 2016-03-05 ENCOUNTER — Ambulatory Visit (INDEPENDENT_AMBULATORY_CARE_PROVIDER_SITE_OTHER): Payer: PPO | Admitting: Internal Medicine

## 2016-03-05 ENCOUNTER — Encounter (HOSPITAL_COMMUNITY)
Admission: RE | Admit: 2016-03-05 | Discharge: 2016-03-05 | Disposition: A | Discharge status: Home / Self Care | Payer: PPO | Source: Ambulatory Visit | Attending: Cardiovascular Disease | Admitting: Cardiovascular Disease

## 2016-03-05 VITALS — BP 120/70 | HR 52 | Temp 98.1°F | Resp 14 | Ht 69.0 in | Wt 144.0 lb

## 2016-03-05 DIAGNOSIS — M48061 Spinal stenosis, lumbar region without neurogenic claudication: Secondary | ICD-10-CM

## 2016-03-05 DIAGNOSIS — H6122 Impacted cerumen, left ear: Secondary | ICD-10-CM | POA: Diagnosis not present

## 2016-03-05 DIAGNOSIS — Z955 Presence of coronary angioplasty implant and graft: Secondary | ICD-10-CM

## 2016-03-05 DIAGNOSIS — I2699 Other pulmonary embolism without acute cor pulmonale: Secondary | ICD-10-CM | POA: Diagnosis not present

## 2016-03-05 DIAGNOSIS — I252 Old myocardial infarction: Secondary | ICD-10-CM | POA: Diagnosis not present

## 2016-03-05 DIAGNOSIS — I480 Paroxysmal atrial fibrillation: Secondary | ICD-10-CM

## 2016-03-05 DIAGNOSIS — I2109 ST elevation (STEMI) myocardial infarction involving other coronary artery of anterior wall: Secondary | ICD-10-CM

## 2016-03-05 DIAGNOSIS — I10 Essential (primary) hypertension: Secondary | ICD-10-CM | POA: Diagnosis not present

## 2016-03-05 DIAGNOSIS — Z23 Encounter for immunization: Secondary | ICD-10-CM

## 2016-03-05 DIAGNOSIS — Z8673 Personal history of transient ischemic attack (TIA), and cerebral infarction without residual deficits: Secondary | ICD-10-CM

## 2016-03-05 DIAGNOSIS — E7800 Pure hypercholesterolemia, unspecified: Secondary | ICD-10-CM

## 2016-03-05 DIAGNOSIS — I2129 ST elevation (STEMI) myocardial infarction involving other sites: Secondary | ICD-10-CM

## 2016-03-05 DIAGNOSIS — I2510 Atherosclerotic heart disease of native coronary artery without angina pectoris: Secondary | ICD-10-CM

## 2016-03-05 DIAGNOSIS — I213 ST elevation (STEMI) myocardial infarction of unspecified site: Secondary | ICD-10-CM

## 2016-03-05 NOTE — Assessment & Plan Note (Signed)
On eliquis Rate controlled In sinus rhythm

## 2016-03-05 NOTE — Progress Notes (Signed)
Pre visit review using our clinic review tool, if applicable. No additional management support is needed unless otherwise documented below in the visit note. 

## 2016-03-05 NOTE — Patient Instructions (Addendum)
  All other Health Maintenance issues reviewed.   All recommended immunizations and age-appropriate screenings are up-to-date or discussed.  Flu vaccine administered today.   Medications reviewed and updated.  No changes recommended at this time.   Your left ear was cleaned out.  Please followup in one year

## 2016-03-05 NOTE — Assessment & Plan Note (Signed)
He did have surgery, but has chronic residual pain. Standing or walking for prolonged periods of time causes pain and he is decreased ambulation due to this He describes the pain as more of an ache Wondered about further surgery-discouraged him having further surgery since the pain is something he can tolerate-not currently taking any over-the-counter pain medication

## 2016-03-05 NOTE — Progress Notes (Signed)
Subjective:    Patient ID: Henry Cotta., male    DOB: 1926/11/14, 82 y.o.   MRN: [id number]  HPI He is here to establish with a new pcp.     Spinal stenosis:  He had surgery.  He has pain after standing for a half hour or with activity. When sitting he has no pain.  He does not take anything for it.  He describes the pain as more of an ache.  He wonders about surgery.   CAD, STEMI, hypertension, hyperlipidemia: He is following with cardiology. He is taking all his medication daily as prescribed.  He had an ST elevation MI over the summer. He currently denies chest pain, palpitations, shortness of breath, leg swelling, headaches and lightheadedness. He is doing cardiac rehabilitation.  Decreased hearing: His wife is concerned about his hearing. She is not overly concerned.  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 He was on eliquis for that and this was discontinued a few days prior to his ST elevation MI.  He was following with pulmonary, but can see them as and on needed basis.  History of stroke: He has a stroke 2 years ago. He denies any residual effects.    Medications and allergies reviewed with patient and updated if appropriate.  Patient Active Problem List   Diagnosis Date Noted  . CAD (coronary artery disease) 12/03/2015  . Atrial fibrillation ([place]) 11/21/2015  . STEMI (ST elevation myocardial infarction) ([place]) 11/19/2015  . Acute ST elevation myocardial infarction (STEMI) of anterolateral wall ([place])   . Pruritus 01/02/2015  . History of CVA (cerebrovascular accident) 05/31/2014  . Pulmonary embolism ([place]) 05/20/2014  . HTN (hypertension) 05/20/2014  . Unspecified late effects of cerebrovascular disease 05/20/2014  . Late effects of cerebrovascular disease   . Prostate cancer ([place]) 04/05/2014  . Spinal stenosis of lumbar region 11/15/2013  . Falls 05/10/2013  . Hyperlipidemia   . Benign essential tremor   .  Supraventricular premature beats   . Lumbago   . Palpitations   . Ingrown toenail without infection 09/07/2012    Current Outpatient Prescriptions on File Prior to Visit  Medication Sig Dispense Refill  . apixaban (ELIQUIS) 5 MG TABS tablet Take 1 tablet (5 mg total) by mouth 2 (two) times daily. 180 tablet 3  . atorvastatin (LIPITOR) 80 MG tablet Take 1 tablet (80 mg total) by mouth daily at 6 PM. 30 tablet 11  . Cholecalciferol (VITAMIN D-3) 1000 UNITS CAPS Take 1 capsule by mouth daily. Take one tablet by mouth once daily.    . clopidogrel (PLAVIX) 75 MG tablet Take 1 tablet (75 mg total) by mouth daily with breakfast. 30 tablet 11  . lisinopril (PRINIVIL,ZESTRIL) 2.5 MG tablet Take 1 tablet (2.5 mg total) by mouth daily. 30 tablet 11  . Multiple Vitamin (MULTIVITAMIN) tablet Take 1 tablet by mouth daily.    . nitroGLYCERIN (NITROSTAT) 0.4 MG SL tablet Place 1 tablet (0.4 mg total) under the tongue every 5 (five) minutes x 3 doses as needed for chest pain. 25 tablet 3   No current facility-administered medications on file prior to visit.     Past Medical History:  Diagnosis Date  . CAD (coronary artery disease)    a. STEMI 6/17: LHC - oLAD 10, mLAD 50, dLAD 20, D2 inf subbranch 70 + sup subbranch 100, pRCA 20, oRPDA 40, EF 35-45% >> PCI:  2.25 x 12 mm resolute integrity DES to the  superior subbranch of D2   . Essential and other specified forms of tremor   . External hemorrhoids without mention of complication   . History of ST elevation myocardial infarction (STEMI)    a. Ant-Lat STEMI >> DES to D2 subbranch  . History of stroke   . History of TIA (transient ischemic attack)   . HTN (hypertension)   . Hyperlipidemia   . Ischemic cardiomyopathy    a. Echo 6/17: Mid and distal anterior/inferior, septal and apical HK, mild concentric LVH, EF 30-35%, mild LAE  //  b. Echo 9/17 mild focal basal septal hypertrophy, EF 55-60%, normal wall motion, grade 1 diastolic dysfunction, mildly  dilated aortic root (37 mm), normal RVSF, PASP 29 mmHg  . Lumbago   . Malignant neoplasm of prostate ([place])   . Nontraumatic rupture of tendons of biceps (long head)   . Paroxysmal atrial fibrillation ([facility])    during admit for STEMI 6/17 >> triple Rx high risk >> ASA + Plavix x 30 days post MI, then Eliquis + Plavix  . Sebaceous cyst   . Supraventricular premature beats   . Unspecified anomaly of tooth position     Past Surgical History:  Procedure Laterality Date  . APPENDECTOMY  1942  . BACK SURGERY     with sciatica  . CARDIAC CATHETERIZATION N/A 11/19/2015   Procedure: Left Heart Cath and Coronary Angiography;  Surgeon: Burnell Blanks, MD;  Location: [facility];  Service: Cardiovascular;  Laterality: N/A;  . CARDIAC CATHETERIZATION N/A 11/19/2015   Procedure: Coronary Stent Intervention;  Surgeon: Burnell Blanks, MD;  Location: [facility];  Service: Cardiovascular;  Laterality: N/A;  . prostate cancer    . SPINE SURGERY N/A 2000   [name]    Social History   Social History  . Marital status: Married    Spouse name: N/A  . Number of children: N/A  . Years of education: N/A   Occupational History  . retired    Social History Main Topics  . Smoking status: Never Smoker  . Smokeless tobacco: Never Used  . Alcohol use No  . Drug use: No  . Sexual activity: Not on file   Other Topics Concern  . Not on file   Social History Narrative  . No narrative on file    Family History  Problem Relation Age of Onset  . Cancer Father 65    brain tumor    Review of Systems  Constitutional: Negative for appetite change, chills, fatigue and fever.  HENT: Positive for postnasal drip. Negative for nosebleeds.   Respiratory: Negative for shortness of breath and wheezing.   Cardiovascular: Negative for chest pain, palpitations and leg swelling.  Gastrointestinal: Negative for abdominal pain, blood in stool and nausea.       No gerd  Genitourinary:  Negative for hematuria.  Musculoskeletal: Positive for back pain.  Neurological: Negative for dizziness, weakness, light-headedness, numbness and headaches.  Hematological: Does not bruise/bleed easily.  Psychiatric/Behavioral: Negative for dysphoric mood. The patient is not nervous/anxious.        Objective:   Vitals:   03/05/16 0907  BP: 120/70  Pulse: (!) 52  Resp: 14  Temp: 98.1 F (36.7 C)   Filed Weights   03/05/16 0907  Weight: 144 lb (65.3 kg)   Body mass index is 21.27 kg/m.   Physical Exam  Constitutional: He is oriented to person, place, and time. He appears well-developed and well-nourished. No distress.  HENT:  Head: Normocephalic and atraumatic.  Right Ear: External ear normal.  Left Ear: External ear normal.  Mouth/Throat: Oropharynx is clear and moist.  Right ear canal with minimal cerumen, left ear canal with impacted cerumen  Eyes: Conjunctivae are normal.  Neck: Normal range of motion. Neck supple. No tracheal deviation present. No thyromegaly present.  Pulmonary/Chest: Effort normal and breath sounds normal. No respiratory distress. He has no wheezes. He has no rales.  Abdominal: Soft. He exhibits no distension. There is no tenderness. There is no rebound and no guarding.  Musculoskeletal: He exhibits no edema.  Lymphadenopathy:    He has no cervical adenopathy.  Neurological: He is alert and oriented to person, place, and time.  Skin: Skin is warm and dry. He is not diaphoretic.  Psychiatric: He has a normal mood and affect. His behavior is normal.          Assessment & Plan:   Flu shot today  See Problem List for Assessment and Plan of chronic medical problems.

## 2016-03-05 NOTE — Assessment & Plan Note (Signed)
Ear lavage attempted in some of the cerumen was removed. He wanted to discontinue the procedure before complete removal of cerumen

## 2016-03-05 NOTE — Assessment & Plan Note (Signed)
Following with cardiology Currently asymptomatic Medication management cardio

## 2016-03-05 NOTE — Assessment & Plan Note (Signed)
BP well controlled Current regimen effective and well tolerated Continue current medications at current doses  

## 2016-03-05 NOTE — Assessment & Plan Note (Signed)
Continue atorvastatin 80 mg daily. 

## 2016-03-05 NOTE — Assessment & Plan Note (Signed)
On anticoagulation Blood pressure controlled On statin Continue current medications

## 2016-03-06 ENCOUNTER — Ambulatory Visit: Payer: Self-pay

## 2016-03-07 ENCOUNTER — Encounter (HOSPITAL_COMMUNITY): Payer: PPO

## 2016-03-07 ENCOUNTER — Encounter (HOSPITAL_COMMUNITY)
Admission: RE | Admit: 2016-03-07 | Discharge: 2016-03-07 | Disposition: A | Discharge status: Home / Self Care | Payer: PPO | Source: Ambulatory Visit | Attending: Cardiovascular Disease | Admitting: Cardiovascular Disease

## 2016-03-07 DIAGNOSIS — Z955 Presence of coronary angioplasty implant and graft: Secondary | ICD-10-CM

## 2016-03-07 DIAGNOSIS — I252 Old myocardial infarction: Secondary | ICD-10-CM | POA: Diagnosis not present

## 2016-03-07 DIAGNOSIS — I213 ST elevation (STEMI) myocardial infarction of unspecified site: Secondary | ICD-10-CM

## 2016-03-10 ENCOUNTER — Encounter (HOSPITAL_COMMUNITY)
Admission: RE | Admit: 2016-03-10 | Discharge: 2016-03-10 | Disposition: A | Discharge status: Home / Self Care | Payer: PPO | Source: Ambulatory Visit | Attending: Cardiovascular Disease | Admitting: Cardiovascular Disease

## 2016-03-10 ENCOUNTER — Encounter (HOSPITAL_COMMUNITY): Payer: PPO

## 2016-03-10 DIAGNOSIS — I252 Old myocardial infarction: Secondary | ICD-10-CM | POA: Diagnosis not present

## 2016-03-10 DIAGNOSIS — I213 ST elevation (STEMI) myocardial infarction of unspecified site: Secondary | ICD-10-CM

## 2016-03-10 DIAGNOSIS — Z955 Presence of coronary angioplasty implant and graft: Secondary | ICD-10-CM

## 2016-03-11 ENCOUNTER — Other Ambulatory Visit: Payer: Self-pay

## 2016-03-11 NOTE — Patient Outreach (Addendum)
[facility]) Care Management  03/11/2016  Henry Franklin. 20-May-1927 [id number]  REFERRAL SOURCE; Health team advantage/ silverback referral REFERRAL REASON: Status post Myocardial infarction. ,   In cardiac rehab .Ongoing health education related to new medications and new diagnosis of myocardial infarction  SUBJECTIVE: Telephone call to patient for follow up.  HIPAA verified with patient. Patient states he is doing well.  Patient states he continues to have cardiac rehab 3 times per week. Patient states he continues to feel stronger.  Patient states he had a follow up appointment with his primary MD on 03/05/16.  Patient reports no changes to his current treatment plan.  Patient states he has follow up with his cardiologist in November 2017.  Patient reports he has lost at least 5 lbs.  Patient states he has been eating healthy and doing the exercises in cardiac rehab.  Patient inquired about silver fit program. Patient states he has not followed up regarding this.  RNCM advised patient to contact customer service on his insurance card regarding additional information about the silver fit program.  Patient reports no changes in his health status since last outreach from [facility]. Patient report he received EMMI education material from [facility] on heart attack and plavix. RNCM discussed home care due to status post hospitalization. RNCM advised patient to continue to take his medications as prescribed. RNCM advised patient to continue to take his blood thinners as prescribed and report any signs of bleeding to his doctor as soon as possible. Patient discussed knowledge of signs of bleeding from gums, increased skin bruising, and bleeding in the urine. Patient states he is aware of reporting these signs to his doctor.  RNCM reviewed signs/ symptoms of heart attack with patient.  RNCM advised patient to keep follow up appointments with doctor.  Patient states he is unable to complete  call due at this time due to having to follow up with a phone call. Patient verbally agrees to next outreach with RNCM.    ASSESSMENT:   Ongoing education status post heart attack  PLAN:  RNCM will contact patient in month of November 2017.   Patient will verbalized signs of heart attack Patient will discuss low salt food item choices.  Quinn Plowman RN,BSN,CCM [facility] Telephonic  [phone]

## 2016-03-12 ENCOUNTER — Encounter (HOSPITAL_COMMUNITY): Payer: PPO

## 2016-03-12 ENCOUNTER — Encounter (HOSPITAL_COMMUNITY)
Admission: RE | Admit: 2016-03-12 | Discharge: 2016-03-12 | Disposition: A | Discharge status: Home / Self Care | Payer: PPO | Source: Ambulatory Visit | Attending: Cardiovascular Disease | Admitting: Cardiovascular Disease

## 2016-03-12 DIAGNOSIS — I213 ST elevation (STEMI) myocardial infarction of unspecified site: Secondary | ICD-10-CM

## 2016-03-12 DIAGNOSIS — I252 Old myocardial infarction: Secondary | ICD-10-CM | POA: Diagnosis not present

## 2016-03-12 DIAGNOSIS — Z955 Presence of coronary angioplasty implant and graft: Secondary | ICD-10-CM

## 2016-03-12 NOTE — Progress Notes (Signed)
Cardiac Individual Treatment Plan  Patient Details  Name: Henry Franklin. MRN: [id number] Date of Birth: 08/02/1926 Referring Provider:   Flowsheet Row CARDIAC REHAB PHASE II EXERCISE from 01/28/2016 in [place]  Referring Provider  [place]      Initial Encounter Date:  [place] PHASE II EXERCISE from 01/28/2016 in [place]  Date  01/29/16  Referring Provider  Angelena Form      Visit Diagnosis: Stented coronary artery  ST elevation myocardial infarction (STEMI), unspecified artery ([place])  Patient's Home Medications on Admission:  Current Outpatient Prescriptions:  .  apixaban (ELIQUIS) 5 MG TABS tablet, Take 1 tablet (5 mg total) by mouth 2 (two) times daily., Disp: 180 tablet, Rfl: 3 .  atorvastatin (LIPITOR) 80 MG tablet, Take 1 tablet (80 mg total) by mouth daily at 6 PM., Disp: 30 tablet, Rfl: 11 .  Cholecalciferol (VITAMIN D-3) 1000 UNITS CAPS, Take 1 capsule by mouth daily. Take one tablet by mouth once daily., Disp: , Rfl:  .  clopidogrel (PLAVIX) 75 MG tablet, Take 1 tablet (75 mg total) by mouth daily with breakfast., Disp: 30 tablet, Rfl: 11 .  lisinopril (PRINIVIL,ZESTRIL) 2.5 MG tablet, Take 1 tablet (2.5 mg total) by mouth daily., Disp: 30 tablet, Rfl: 11 .  Multiple Vitamin (MULTIVITAMIN) tablet, Take 1 tablet by mouth daily., Disp: , Rfl:  .  nitroGLYCERIN (NITROSTAT) 0.4 MG SL tablet, Place 1 tablet (0.4 mg total) under the tongue every 5 (five) minutes x 3 doses as needed for chest pain., Disp: 25 tablet, Rfl: 3  Past Medical History: Past Medical History:  Diagnosis Date  . CAD (coronary artery disease)    a. STEMI 6/17: LHC - oLAD 10, mLAD 50, dLAD 20, D2 inf subbranch 70 + sup subbranch 100, pRCA 20, oRPDA 40, EF 35-45% >> PCI:  2.25 x 12 mm resolute integrity DES to the superior subbranch of D2   . Essential and other specified forms of tremor   . External hemorrhoids without  mention of complication   . History of ST elevation myocardial infarction (STEMI)    a. Ant-Lat STEMI >> DES to D2 subbranch  . History of stroke   . History of TIA (transient ischemic attack)   . HTN (hypertension)   . Hyperlipidemia   . Ischemic cardiomyopathy    a. Echo 6/17: Mid and distal anterior/inferior, septal and apical HK, mild concentric LVH, EF 30-35%, mild LAE  //  b. Echo 9/17 mild focal basal septal hypertrophy, EF 55-60%, normal wall motion, grade 1 diastolic dysfunction, mildly dilated aortic root (37 mm), normal RVSF, PASP 29 mmHg  . Lumbago   . Malignant neoplasm of prostate ([place])   . Nontraumatic rupture of tendons of biceps (long head)   . Paroxysmal atrial fibrillation (HCC)    during admit for STEMI 6/17 >> triple Rx high risk >> ASA + Plavix x 30 days post MI, then Eliquis + Plavix  . Sebaceous cyst   . Supraventricular premature beats   . Unspecified anomaly of tooth position     Tobacco Use: History  Smoking Status  . Never Smoker  Smokeless Tobacco  . Never Used    Labs: Recent Review Flowsheet Data    Labs for ITP Cardiac and Pulmonary Rehab Latest Ref Rng & Units 07/03/2014 11/19/2015 11/20/2015 12/05/2015 01/14/2016   Cholestrol 125 - 200 mg/dL 174 152 113 91(L) 108(L)   LDLCALC <130 mg/dL 100(H) 76 56 36  51   HDL >=40 mg/dL 52 48 36(L) 46 42   Trlycerides <150 mg/dL 111 139 107 47 73   TCO2 0 - 100 mmol/L - 31 - - -      Capillary Blood Glucose: No results found for: GLUCAP   Exercise Target Goals:    Exercise Program Goal: Individual exercise prescription set with THRR, safety & activity barriers. Participant demonstrates ability to understand and report RPE using BORG scale, to self-measure pulse accurately, and to acknowledge the importance of the exercise prescription.  Exercise Prescription Goal: Starting with aerobic activity 30 plus minutes a day, 3 days per week for initial exercise prescription. Provide home exercise prescription and  guidelines that participant acknowledges understanding prior to discharge.  Activity Barriers & Risk Stratification:     Activity Barriers & Cardiac Risk Stratification - 01/22/16 1203      Activity Barriers & Cardiac Risk Stratification   Cardiac Risk Stratification High      6 Minute Walk:     6 Minute Walk    Row Name 01/28/16 1400 01/29/16 0838       6 Minute Walk   Phase Initial  -    Distance 1281 feet -    Walk Time 6 minutes -    MPH 2.42 -    METS 2.2 -    RPE 11 -    VO2 Peak 7.72  -    Resting HR 48 bpm -    Resting BP 108/70 -    Max Ex. HR 82 bpm -    Max Ex. BP 138/76 -    2 Minute Post BP 124/60 -       Initial Exercise Prescription:     Initial Exercise Prescription - 01/29/16 0800      Date of Initial Exercise RX and Referring Provider   Date 01/29/16   Referring Provider [place]     NuStep   Level 1   Minutes 10   METs 2     Arm Ergometer   Level 1   Watts 23   Minutes 10     Track   Laps 7   Minutes 10     Prescription Details   Frequency (times per week) 3   Duration Progress to 30 minutes of continuous aerobic without signs/symptoms of physical distress     Intensity   THRR 40-80% of Max Heartrate 53-106   Ratings of Perceived Exertion 11-13     Progression   Progression Continue to progress workloads to maintain intensity without signs/symptoms of physical distress.     Resistance Training   Training Prescription Yes   Weight 2   Reps 10-12      Perform Capillary Blood Glucose checks as needed.  Exercise Prescription Changes:      Exercise Prescription Changes    Row Name 02/11/16 1600 03/10/16 1600           Exercise Review   Progression Yes Yes        Response to Exercise   Blood Pressure (Admit) 130/70 128/60      Blood Pressure (Exercise) 120/70 130/64      Blood Pressure (Exit) 104/60 116/80      Heart Rate (Admit) 59 bpm 66 bpm      Heart Rate (Exercise) 86 bpm 85 bpm      Heart Rate (Exit)  68 bpm 66 bpm      Rating of Perceived Exertion (Exercise) 12 13  Comments Reviewed HEP on 02/08/16 Reviewed HEP on 02/08/16      Duration Progress to 30 minutes of continuous aerobic without signs/symptoms of physical distress Progress to 30 minutes of continuous aerobic without signs/symptoms of physical distress      Intensity THRR unchanged THRR unchanged        Progression   Average METs 2.3 2.8        Resistance Training   Training Prescription Yes Yes      Weight 2 2      Reps 10-12 10-12        NuStep   Level 3 5      Minutes 10 10      METs 2.3 3        Arm Ergometer   Level 1 2      Watts 23 23      Minutes 10 10        Track   Laps 7 8      Minutes 10 10        Home Exercise Plan   Plans to continue exercise at Home  Reviewed on 02/08/16. See progress note Home  Reviewed on 02/08/16. See progress note      Frequency Add 2 additional days to program exercise sessions. Add 2 additional days to program exercise sessions.         Exercise Comments:      Exercise Comments    Row Name 02/11/16 1659 03/10/16 1637         Exercise Comments Reviewed METs and goals. Pt is tolerating exercise fairly well; will continue to monitor exercise progression. Reviewed METs and goals. Pt is tolerating exercise fairly well; will continue to monitor exercise progression.         Discharge Exercise Prescription (Final Exercise Prescription Changes):     Exercise Prescription Changes - 03/10/16 1600      Exercise Review   Progression Yes     Response to Exercise   Blood Pressure (Admit) 128/60   Blood Pressure (Exercise) 130/64   Blood Pressure (Exit) 116/80   Heart Rate (Admit) 66 bpm   Heart Rate (Exercise) 85 bpm   Heart Rate (Exit) 66 bpm   Rating of Perceived Exertion (Exercise) 13   Comments Reviewed HEP on 02/08/16   Duration Progress to 30 minutes of continuous aerobic without signs/symptoms of physical distress   Intensity THRR unchanged     Progression    Average METs 2.8     Resistance Training   Training Prescription Yes   Weight 2   Reps 10-12     NuStep   Level 5   Minutes 10   METs 3     Arm Ergometer   Level 2   Watts 23   Minutes 10     Track   Laps 8   Minutes 10     Home Exercise Plan   Plans to continue exercise at Home  Reviewed on 02/08/16. See progress note   Frequency Add 2 additional days to program exercise sessions.      Nutrition:  Target Goals: Understanding of nutrition guidelines, daily intake of sodium 1500mg , cholesterol 200mg , calories 30% from fat and 7% or less from saturated fats, daily to have 5 or more servings of fruits and vegetables.  Biometrics:     Pre Biometrics - 01/30/16 1630      Pre Biometrics   Height 5' 8.75" (1.746 m)   Weight 143 lb 15.4 oz (65.3  kg)   Waist Circumference 32.5 inches   Hip Circumference 37.5 inches   Waist to Hip Ratio 0.87 %   BMI (Calculated) 21.5   Triceps Skinfold 8 mm   % Body Fat 19.8 %   Grip Strength 26 kg   Flexibility 0 in   Single Leg Stand 0 seconds       Nutrition Therapy Plan and Nutrition Goals:     Nutrition Therapy & Goals - 01/30/16 0936      Nutrition Therapy   Diet High Calorie, High Protein     Personal Nutrition Goals   Personal Goal #1 1-2 lb wt gain to wt gain goal of 6-12 lb at graduation from [place], educate and counsel regarding individualized specific dietary modifications aiming towards targeted core components such as weight, hypertension, lipid management, diabetes, heart failure and other comorbidities.   Expected Outcomes Short Term Goal: Understand basic principles of dietary content, such as calories, fat, sodium, cholesterol and nutrients.;Long Term Goal: Adherence to prescribed nutrition plan.      Nutrition Discharge: Nutrition Scores:     Nutrition Assessments - 01/30/16 0936      MEDFICTS Scores   Pre Score 24      Nutrition Goals  Re-Evaluation:   Psychosocial: Target Goals: Acknowledge presence or absence of depression, maximize coping skills, provide positive support system. Participant is able to verbalize types and ability to use techniques and skills needed for reducing stress and depression.  Initial Review & Psychosocial Screening:     Initial Psych Review & Screening - 01/29/16 [place]? Yes     Barriers   Psychosocial barriers to participate in program There are no identifiable barriers or psychosocial needs.     Screening Interventions   Interventions Encouraged to exercise      Quality of Life Scores:     Quality of Life - 02/27/16 1703      Quality of Life Scores   GLOBAL Pre --  pt quality of life somewhat reduced due to health related anxiety. pt has overall hopeful, positive outlook.  pt is most concerned about the decline in his physical condition, balance and stamina.       PHQ-9: Recent Review Flowsheet Data    Depression screen [facility] 2/9 01/02/2016 12/18/2015 07/11/2015 05/31/2014 04/05/2014   Decreased Interest 0 0 0 0 0   Down, Depressed, Hopeless 0 0 0 0 0   PHQ - 2 Score 0 0 0 0 0      Psychosocial Evaluation and Intervention:   Psychosocial Re-Evaluation:     Psychosocial Re-Evaluation    Row Name 02/15/16 1421 03/12/16 1105           Psychosocial Re-Evaluation   Interventions Encouraged to attend Cardiac Rehabilitation for the exercise Encouraged to attend Cardiac Rehabilitation for the exercise      Comments no psychosocial needs identified, no psychosocial interventions necessary.  back pain limits activity.  pt is eager to return to his previous level of exertion.  no psychsocial needs identified, no psychosocial interventions necessary.  Back pain limits activity.  pt feels his strength is building.  pt often overdoes activity, pt cautioned to take rest breaks when needed and avoid overexertion.        Continued Psychosocial  Services Needed No No         Vocational Rehabilitation: Provide vocational rehab assistance to  qualifying candidates.   Vocational Rehab Evaluation & Intervention:     Vocational Rehab - 01/29/16 0850      Initial Vocational Rehab Evaluation & Intervention   Assessment shows need for Vocational Rehabilitation No      Education: Education Goals: Education classes will be provided on a weekly basis, covering required topics. Participant will state understanding/return demonstration of topics presented.  Learning Barriers/Preferences:     Learning Barriers/Preferences - 01/22/16 1203      Learning Barriers/Preferences   Learning Barriers Sight   Learning Preferences Group Instruction;Individual Instruction;Verbal Instruction;Written Material      Education Topics: Count Your Pulse:  -Group instruction provided by verbal instruction, demonstration, patient participation and written materials to support subject.  Instructors address importance of being able to find your pulse and how to count your pulse when at home without a heart monitor.  Patients get hands on experience counting their pulse with staff help and individually. Flowsheet Row CARDIAC REHAB PHASE II EXERCISE from 02/15/2016 in [place]  Date  02/01/16  Educator  Barnet Pall, RN  Instruction Review Code  2- meets goals/outcomes      Heart Attack, Angina, and Risk Factor Modification:  -Group instruction provided by verbal instruction, video, and written materials to support subject.  Instructors address signs and symptoms of angina and heart attacks.    Also discuss risk factors for heart disease and how to make changes to improve heart health risk factors.   Functional Fitness:  -Group instruction provided by verbal instruction, demonstration, patient participation, and written materials to support subject.  Instructors address safety measures for doing things around the  house.  Discuss how to get up and down off the floor, how to pick things up properly, how to safely get out of a chair without assistance, and balance training. Flowsheet Row CARDIAC REHAB PHASE II EXERCISE from 02/15/2016 in [place]  Date  02/15/16  Instruction Review Code  2- meets goals/outcomes      Meditation and Mindfulness:  -Group instruction provided by verbal instruction, patient participation, and written materials to support subject.  Instructor addresses importance of mindfulness and meditation practice to help reduce stress and improve awareness.  Instructor also leads participants through a meditation exercise.    Stretching for Flexibility and Mobility:  -Group instruction provided by verbal instruction, patient participation, and written materials to support subject.  Instructors lead participants through series of stretches that are designed to increase flexibility thus improving mobility.  These stretches are additional exercise for major muscle groups that are typically performed during regular warm up and cool down.   Hands Only CPR Anytime:  -Group instruction provided by verbal instruction, video, patient participation and written materials to support subject.  Instructors co-teach with AHA video for hands only CPR.  Participants get hands on experience with mannequins.   Nutrition I class: Heart Healthy Eating:  -Group instruction provided by PowerPoint slides, verbal discussion, and written materials to support subject matter. The instructor gives an explanation and review of the Therapeutic Lifestyle Changes diet recommendations, which includes a discussion on lipid goals, dietary fat, sodium, fiber, plant stanol/sterol esters, sugar, and the components of a well-balanced, healthy diet. Flowsheet Row CARDIAC REHAB PHASE II EXERCISE from 02/15/2016 in [place]  Date  02/05/16  Educator  RD  Instruction  Review Code  2- meets goals/outcomes      Nutrition II class: Lifestyle Skills:  -Group  instruction provided by PowerPoint slides, verbal discussion, and written materials to support subject matter. The instructor gives an explanation and review of label reading, grocery shopping for heart health, heart healthy recipe modifications, and ways to make healthier choices when eating out. 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 The instructor gives an explanation and review of diabetes co-morbidities, pre- and post-prandial blood glucose goals, pre-exercise blood glucose goals, signs, symptoms, and treatment of hypoglycemia and hyperglycemia, and foot care basics.   Diabetes Blitz:  -Group instruction provided by PowerPoint slides, verbal discussion, and written materials to support subject matter. The instructor gives an explanation and review of the physiology behind type 1 and type 2 diabetes, diabetes medications and rational behind using different medications, pre- and post-prandial blood glucose recommendations and Hemoglobin A1c goals, diabetes diet, and exercise including blood glucose guidelines for exercising safely.    Portion Distortion:  -Group instruction provided by PowerPoint slides, verbal discussion, written materials, and food models to support subject matter. The instructor gives an explanation of serving size versus portion size, changes in portions sizes over the last 20 years, and what consists of a serving from each food group.   Stress Management:  -Group instruction provided by verbal instruction, video, and  written materials to support subject matter.  Instructors review role of stress in heart disease and how to cope with stress positively.     Exercising on Your Own:  -Group instruction provided by verbal instruction, power point, and written materials to support subject.  Instructors discuss benefits of exercise, components of exercise, frequency and intensity of exercise, and end points for exercise.  Also discuss use of nitroglycerin and activating EMS.  Review options of places to exercise outside of rehab.  Review guidelines for sex with heart disease.   Cardiac Drugs I:  -Group instruction provided by verbal instruction and written materials to support subject.  Instructor reviews cardiac drug classes: antiplatelets, anticoagulants, beta blockers, and statins.  Instructor discusses reasons, side effects, and lifestyle considerations for each drug class.   Cardiac Drugs II:  -Group instruction provided by verbal instruction and written materials to support subject.  Instructor reviews cardiac drug classes: angiotensin converting enzyme inhibitors (ACE-I), angiotensin II receptor blockers (ARBs), nitrates, and calcium channel blockers.  Instructor discusses reasons, side effects, and lifestyle considerations for each drug class.   Anatomy and Physiology of the Circulatory System:  -Group instruction provided by verbal instruction, video, and written materials to support subject.  Reviews functional anatomy of heart, how it relates to various diagnoses, and what role the heart plays in the overall system. Flowsheet Row CARDIAC REHAB PHASE II EXERCISE from 02/15/2016 in [place]  Date  02/06/16  Instruction Review Code  2- meets goals/outcomes      Knowledge Questionnaire Score:     Knowledge Questionnaire Score - 01/30/16 1638      Knowledge Questionnaire Score   Pre Score 21/24      Core Components/Risk Factors/Patient Goals at Admission:      Personal Goals and Risk Factors at Admission - 01/22/16 1204      Core Components/Risk Factors/Patient Goals on Admission  Sedentary Yes   Intervention Provide advice, education, support and counseling about physical activity/exercise needs.;Develop an individualized exercise prescription for aerobic and resistive training based on initial evaluation findings, risk stratification, comorbidities and participant's personal goals.   Expected Outcomes Achievement of increased cardiorespiratory fitness and enhanced flexibility, muscular endurance and strength shown through measurements of functional capacity and personal statement of participant.   Increase Strength and Stamina Yes   Intervention Provide advice, education, support and counseling about physical activity/exercise needs.;Develop an individualized exercise prescription for aerobic and resistive training based on initial evaluation findings, risk stratification, comorbidities and participant's personal goals.   Expected Outcomes Achievement of increased cardiorespiratory fitness and enhanced flexibility, muscular endurance and strength shown through measurements of functional capacity and personal statement of participant.   Hypertension Yes   Intervention Provide education on lifestyle modifcations including regular physical activity/exercise, weight management, moderate sodium restriction and increased consumption of fresh fruit, vegetables, and low fat dairy, alcohol moderation, and smoking cessation.;Monitor prescription use compliance.   Expected Outcomes Short Term: Continued assessment and intervention until BP is < 140/74mm HG in hypertensive participants. < 130/59mm HG in hypertensive participants with diabetes, heart failure or chronic kidney disease.;Long Term: Maintenance of blood pressure at goal levels.   Lipids Yes   Intervention Provide education and support for participant on nutrition & aerobic/resistive exercise along with  prescribed medications to achieve LDL 70mg , HDL >40mg .   Expected Outcomes Short Term: Participant states understanding of desired cholesterol values and is compliant with medications prescribed. Participant is following exercise prescription and nutrition guidelines.;Long Term: Cholesterol controlled with medications as prescribed, with individualized exercise RX and with personalized nutrition plan. Value goals: LDL < 70mg , HDL > 40 mg.      Core Components/Risk Factors/Patient Goals Review:      Goals and Risk Factor Review    Row Name 02/11/16 1702 03/12/16 1639           Core Components/Risk Factors/Patient Goals Review   Personal Goals Review Other;Increase Strength and Stamina Other;Increase Strength and Stamina      Review Reviewed HEP in which would improve cardiovascular endurance and strength Pt states "getting stronger" and doing some walking at home.      Expected Outcomes Pt will maintain or increase muscle mass and continue to improve in balance/stability Pt will continue to walk at home and increase musculoskeletal strength         Core Components/Risk Factors/Patient Goals at Discharge (Final Review):      Goals and Risk Factor Review - 03/12/16 1639      Core Components/Risk Factors/Patient Goals Review   Personal Goals Review Other;Increase Strength and Stamina   Review Pt states "getting stronger" and doing some walking at home.   Expected Outcomes Pt will continue to walk at home and increase musculoskeletal strength      ITP Comments:     ITP Comments    Row Name 01/22/16 1151           ITP Comments Dr. Fransico Him, Medical Director          Comments: Pt is making expected progress toward personal goals after completing 15 sessions. Recommend continued exercise and life style modification education including  stress management and relaxation techniques to decrease cardiac risk profile.

## 2016-03-14 ENCOUNTER — Encounter (HOSPITAL_COMMUNITY): Payer: PPO

## 2016-03-14 ENCOUNTER — Encounter (HOSPITAL_COMMUNITY)
Admission: RE | Admit: 2016-03-14 | Discharge: 2016-03-14 | Disposition: A | Discharge status: Home / Self Care | Payer: PPO | Source: Ambulatory Visit | Attending: Cardiovascular Disease | Admitting: Cardiovascular Disease

## 2016-03-14 DIAGNOSIS — I252 Old myocardial infarction: Secondary | ICD-10-CM | POA: Diagnosis not present

## 2016-03-14 DIAGNOSIS — Z955 Presence of coronary angioplasty implant and graft: Secondary | ICD-10-CM

## 2016-03-14 DIAGNOSIS — I213 ST elevation (STEMI) myocardial infarction of unspecified site: Secondary | ICD-10-CM

## 2016-03-17 ENCOUNTER — Encounter (HOSPITAL_COMMUNITY)
Admission: RE | Admit: 2016-03-17 | Discharge: 2016-03-17 | Disposition: A | Discharge status: Home / Self Care | Payer: PPO | Source: Ambulatory Visit | Attending: Cardiovascular Disease | Admitting: Cardiovascular Disease

## 2016-03-17 ENCOUNTER — Encounter (HOSPITAL_COMMUNITY): Payer: PPO

## 2016-03-17 DIAGNOSIS — I213 ST elevation (STEMI) myocardial infarction of unspecified site: Secondary | ICD-10-CM

## 2016-03-17 DIAGNOSIS — I252 Old myocardial infarction: Secondary | ICD-10-CM | POA: Diagnosis not present

## 2016-03-17 DIAGNOSIS — Z955 Presence of coronary angioplasty implant and graft: Secondary | ICD-10-CM

## 2016-03-19 ENCOUNTER — Encounter (HOSPITAL_COMMUNITY)
Admission: RE | Admit: 2016-03-19 | Discharge: 2016-03-19 | Disposition: A | Discharge status: Home / Self Care | Payer: PPO | Source: Ambulatory Visit | Attending: Cardiovascular Disease | Admitting: Cardiovascular Disease

## 2016-03-19 ENCOUNTER — Encounter (HOSPITAL_COMMUNITY): Payer: PPO

## 2016-03-19 DIAGNOSIS — I252 Old myocardial infarction: Secondary | ICD-10-CM | POA: Diagnosis not present

## 2016-03-19 DIAGNOSIS — Z955 Presence of coronary angioplasty implant and graft: Secondary | ICD-10-CM

## 2016-03-19 DIAGNOSIS — I213 ST elevation (STEMI) myocardial infarction of unspecified site: Secondary | ICD-10-CM

## 2016-03-21 ENCOUNTER — Encounter (HOSPITAL_COMMUNITY): Admission: RE | Admit: 2016-03-21 | Payer: PPO | Source: Ambulatory Visit

## 2016-03-21 ENCOUNTER — Encounter (HOSPITAL_COMMUNITY): Payer: PPO

## 2016-03-24 ENCOUNTER — Encounter (HOSPITAL_COMMUNITY)
Admission: RE | Admit: 2016-03-24 | Discharge: 2016-03-24 | Disposition: A | Discharge status: Home / Self Care | Payer: PPO | Source: Ambulatory Visit | Attending: Cardiovascular Disease | Admitting: Cardiovascular Disease

## 2016-03-24 ENCOUNTER — Encounter (HOSPITAL_COMMUNITY): Payer: PPO

## 2016-03-24 DIAGNOSIS — I213 ST elevation (STEMI) myocardial infarction of unspecified site: Secondary | ICD-10-CM

## 2016-03-24 DIAGNOSIS — I252 Old myocardial infarction: Secondary | ICD-10-CM | POA: Diagnosis not present

## 2016-03-24 DIAGNOSIS — Z955 Presence of coronary angioplasty implant and graft: Secondary | ICD-10-CM

## 2016-03-26 ENCOUNTER — Encounter (HOSPITAL_COMMUNITY)
Admission: RE | Admit: 2016-03-26 | Discharge: 2016-03-26 | Disposition: A | Discharge status: Home / Self Care | Payer: PPO | Source: Ambulatory Visit | Attending: Cardiovascular Disease | Admitting: Cardiovascular Disease

## 2016-03-26 ENCOUNTER — Encounter (HOSPITAL_COMMUNITY): Payer: PPO

## 2016-03-26 DIAGNOSIS — I252 Old myocardial infarction: Secondary | ICD-10-CM | POA: Diagnosis not present

## 2016-03-26 DIAGNOSIS — Z955 Presence of coronary angioplasty implant and graft: Secondary | ICD-10-CM

## 2016-03-26 DIAGNOSIS — I213 ST elevation (STEMI) myocardial infarction of unspecified site: Secondary | ICD-10-CM

## 2016-03-28 ENCOUNTER — Encounter (HOSPITAL_COMMUNITY): Payer: PPO

## 2016-03-28 ENCOUNTER — Encounter (HOSPITAL_COMMUNITY)
Admission: RE | Admit: 2016-03-28 | Discharge: 2016-03-28 | Disposition: A | Discharge status: Home / Self Care | Payer: PPO | Source: Ambulatory Visit | Attending: Cardiovascular Disease | Admitting: Cardiovascular Disease

## 2016-03-28 DIAGNOSIS — Z955 Presence of coronary angioplasty implant and graft: Secondary | ICD-10-CM

## 2016-03-28 DIAGNOSIS — I213 ST elevation (STEMI) myocardial infarction of unspecified site: Secondary | ICD-10-CM

## 2016-03-28 DIAGNOSIS — I252 Old myocardial infarction: Secondary | ICD-10-CM | POA: Diagnosis not present

## 2016-03-31 ENCOUNTER — Encounter (HOSPITAL_COMMUNITY)
Admission: RE | Admit: 2016-03-31 | Discharge: 2016-03-31 | Disposition: A | Discharge status: Home / Self Care | Payer: PPO | Source: Ambulatory Visit | Attending: Cardiovascular Disease | Admitting: Cardiovascular Disease

## 2016-03-31 ENCOUNTER — Encounter (HOSPITAL_COMMUNITY): Payer: PPO

## 2016-03-31 DIAGNOSIS — I252 Old myocardial infarction: Secondary | ICD-10-CM | POA: Diagnosis not present

## 2016-03-31 DIAGNOSIS — Z955 Presence of coronary angioplasty implant and graft: Secondary | ICD-10-CM

## 2016-03-31 DIAGNOSIS — I213 ST elevation (STEMI) myocardial infarction of unspecified site: Secondary | ICD-10-CM

## 2016-04-02 ENCOUNTER — Encounter (HOSPITAL_COMMUNITY): Payer: PPO

## 2016-04-02 ENCOUNTER — Encounter (HOSPITAL_COMMUNITY)
Admission: RE | Admit: 2016-04-02 | Discharge: 2016-04-02 | Disposition: A | Discharge status: Home / Self Care | Payer: PPO | Source: Ambulatory Visit | Attending: Cardiovascular Disease | Admitting: Cardiovascular Disease

## 2016-04-02 DIAGNOSIS — I10 Essential (primary) hypertension: Secondary | ICD-10-CM | POA: Insufficient documentation

## 2016-04-02 DIAGNOSIS — I480 Paroxysmal atrial fibrillation: Secondary | ICD-10-CM | POA: Insufficient documentation

## 2016-04-02 DIAGNOSIS — I252 Old myocardial infarction: Secondary | ICD-10-CM | POA: Diagnosis not present

## 2016-04-02 DIAGNOSIS — I255 Ischemic cardiomyopathy: Secondary | ICD-10-CM | POA: Insufficient documentation

## 2016-04-02 DIAGNOSIS — Z955 Presence of coronary angioplasty implant and graft: Secondary | ICD-10-CM | POA: Diagnosis not present

## 2016-04-02 DIAGNOSIS — E785 Hyperlipidemia, unspecified: Secondary | ICD-10-CM | POA: Insufficient documentation

## 2016-04-02 DIAGNOSIS — I2510 Atherosclerotic heart disease of native coronary artery without angina pectoris: Secondary | ICD-10-CM | POA: Diagnosis not present

## 2016-04-02 DIAGNOSIS — I213 ST elevation (STEMI) myocardial infarction of unspecified site: Secondary | ICD-10-CM

## 2016-04-03 ENCOUNTER — Ambulatory Visit: Payer: PPO | Admitting: Cardiovascular Disease

## 2016-04-04 ENCOUNTER — Encounter (HOSPITAL_COMMUNITY)
Admission: RE | Admit: 2016-04-04 | Discharge: 2016-04-04 | Disposition: A | Discharge status: Home / Self Care | Payer: PPO | Source: Ambulatory Visit | Attending: Cardiovascular Disease | Admitting: Cardiovascular Disease

## 2016-04-04 ENCOUNTER — Encounter (HOSPITAL_COMMUNITY): Payer: PPO

## 2016-04-04 DIAGNOSIS — I252 Old myocardial infarction: Secondary | ICD-10-CM | POA: Diagnosis not present

## 2016-04-04 DIAGNOSIS — Z955 Presence of coronary angioplasty implant and graft: Secondary | ICD-10-CM

## 2016-04-04 DIAGNOSIS — I213 ST elevation (STEMI) myocardial infarction of unspecified site: Secondary | ICD-10-CM

## 2016-04-07 ENCOUNTER — Encounter (HOSPITAL_COMMUNITY)
Admission: RE | Admit: 2016-04-07 | Discharge: 2016-04-07 | Disposition: A | Discharge status: Home / Self Care | Payer: PPO | Source: Ambulatory Visit | Attending: Cardiovascular Disease | Admitting: Cardiovascular Disease

## 2016-04-07 ENCOUNTER — Ambulatory Visit (INDEPENDENT_AMBULATORY_CARE_PROVIDER_SITE_OTHER): Payer: PPO | Admitting: Physician Assistant

## 2016-04-07 ENCOUNTER — Encounter: Payer: Self-pay | Admitting: Physician Assistant

## 2016-04-07 ENCOUNTER — Encounter (HOSPITAL_COMMUNITY): Payer: PPO

## 2016-04-07 VITALS — BP 130/50 | HR 52 | Ht 70.0 in | Wt 145.0 lb

## 2016-04-07 DIAGNOSIS — I252 Old myocardial infarction: Secondary | ICD-10-CM | POA: Diagnosis not present

## 2016-04-07 DIAGNOSIS — I480 Paroxysmal atrial fibrillation: Secondary | ICD-10-CM

## 2016-04-07 DIAGNOSIS — I2510 Atherosclerotic heart disease of native coronary artery without angina pectoris: Secondary | ICD-10-CM | POA: Diagnosis not present

## 2016-04-07 DIAGNOSIS — Z955 Presence of coronary angioplasty implant and graft: Secondary | ICD-10-CM

## 2016-04-07 DIAGNOSIS — I10 Essential (primary) hypertension: Secondary | ICD-10-CM

## 2016-04-07 DIAGNOSIS — E7800 Pure hypercholesterolemia, unspecified: Secondary | ICD-10-CM | POA: Diagnosis not present

## 2016-04-07 DIAGNOSIS — I213 ST elevation (STEMI) myocardial infarction of unspecified site: Secondary | ICD-10-CM

## 2016-04-07 LAB — CBC
HEMATOCRIT: 46.3 % (ref 38.5–50.0)
HEMOGLOBIN: 15.7 g/dL (ref 13.2–17.1)
MCH: 31.4 pg (ref 27.0–33.0)
MCHC: 33.9 g/dL (ref 32.0–36.0)
MCV: 92.6 fL (ref 80.0–100.0)
MPV: 10.2 fL (ref 7.5–12.5)
Platelets: 209 10*3/uL (ref 140–400)
RBC: 5 MIL/uL (ref 4.20–5.80)
RDW: 13.8 % (ref 11.0–15.0)
WBC: 7.6 10*3/uL (ref 3.8–10.8)

## 2016-04-07 NOTE — Patient Instructions (Addendum)
Medication Instructions:  Your physician recommends that you continue on your current medications as directed. Please refer to the Current Medication list given to you today.  Labwork: TODAY BMET, CBC   Testing/Procedures: NONE  Follow-Up: Your physician wants you to follow-up in: [place] [address]. Angelena Form You will receive a reminder letter in the mail two months in advance. If you don't receive a letter, please call our office to schedule the follow-up appointment.  Any Other Special Instructions Will Be Listed Below (If Applicable).  If you need a refill on your cardiac medications before your next appointment, please call your pharmacy.

## 2016-04-07 NOTE — Progress Notes (Signed)
Cardiology Office Note:    Date:  04/07/2016   ID:  [name], DOB 05-25-1927, MRN [id number]  PCP:  [name], MD  Cardiologist:  Dr. [name]   Electrophysiologist:  N/a Pulmonologist: Dr. [name]  Referring MD: [name], MD   Chief Complaint  Patient presents with  . Follow-up    CAD, AFib    History of Present Illness:    Henry Franklin. is a 82 y.o. male with a hx of HTN, HL, prior stroke/TIA, prostate CA and idiopathic pulmonary embolism. Patient was previously treated with Eliquis. This was stopped 11/17/15 by Pulmonology.   Admitted in 6/17 with an anterolateral STEMI. LHC demonstrated an occluded superior subbranch of the second diagonal, moderate mid LAD stenosis and moderate stenosis in the inferior subbranch of the second diagonal. He underwent PCI of the superior subbranch of D2 with placement of a DES. Post MI, he developed PAF. CHADS2-VASc=7. Patient was placed on amiodarone.  Echo demonstrated EF 30-35.  ASA was DC'd after 30 days and then Eliquis was started.  We eventually stopped his Amiodarone and beta-blocker due to bradycardia.  Last seen 8/17.  He returns for FU.  He is here with his wife. He is doing well. He is going to cardiac rehabilitation. He denies chest discomfort. He denies significant dyspnea. He denies orthopnea, PND or edema. Denies syncope. He does note difficulty with balance and has fallen in the past. He denies recent falls.  Prior CV studies that were reviewed today include:    Echo 02/20/16 Mild focal basal septal hypertrophy, EF 123456, grade 1 diastolic dysfunction, mildly dilated aortic root (37 mm), PASP 29  Echo 11/21/15 Mid and distal anterior/inferior, septal and apical HK, mild concentric LVH, EF 30-35%, mild LAE  LHC 11/19/15 LAD ostial 10%, mid 50%, distal 20%, D2 inferior subbranch 70 and superior subbranch 100% LCx okay RCA proximal 20%, ostial RPDA 40% EF 35-45% with apical and anterior HK PCI:  2.25 x 12 mm resolute integrity DES to the superior subbranch of D2 1. Acute anterolateral STEMI 2. Occluded superior sub-branch of the moderate caliber Diagonal branch.  3. Successful PTCA/DES x 1 Diagonal 1 4. Moderate stenosis mid LAD (appears to be non flow limiting) 5. Mild non-obstructive disease RCA 6. Mild to moderate segmental LV systolic dysfunction 7. SVT during intervention  Myoview 7/05 Normal  Past Medical History:  Diagnosis Date  . CAD (coronary artery disease)    a. STEMI 6/17: LHC - oLAD 10, mLAD 50, dLAD 20, D2 inf subbranch 70 + sup subbranch 100, pRCA 20, oRPDA 40, EF 35-45% >> PCI:  2.25 x 12 mm resolute integrity DES to the superior subbranch of D2   . Essential and other specified forms of tremor   . External hemorrhoids without mention of complication   . History of ST elevation myocardial infarction (STEMI)    a. Ant-Lat STEMI >> DES to D2 subbranch  . History of stroke   . History of TIA (transient ischemic attack)   . HTN (hypertension)   . Hyperlipidemia   . Ischemic cardiomyopathy    a. Echo 6/17: Mid and distal anterior/inferior, septal and apical HK, mild concentric LVH, EF 30-35%, mild LAE  //  b. Echo 9/17 mild focal basal septal hypertrophy, EF 55-60%, normal wall motion, grade 1 diastolic dysfunction, mildly dilated aortic root (37 mm), normal RVSF, PASP 29 mmHg  . Lumbago   . Malignant neoplasm of prostate ([name])   . Nontraumatic  rupture of tendons of biceps (long head)   . Paroxysmal atrial fibrillation (HCC)    during admit for STEMI 6/17 >> triple Rx high risk >> ASA + Plavix x 30 days post MI, then Eliquis + Plavix  . Sebaceous cyst   . Supraventricular premature beats   . Unspecified anomaly of tooth position     Past Surgical History:  Procedure Laterality Date  . APPENDECTOMY  1942  . BACK SURGERY     with sciatica  . CARDIAC CATHETERIZATION N/A 11/19/2015   Procedure: Left Heart Cath and Coronary Angiography;  Surgeon: Burnell Blanks, MD;  Location: [facility];  Service: Cardiovascular;  Laterality: N/A;  . CARDIAC CATHETERIZATION N/A 11/19/2015   Procedure: Coronary Stent Intervention;  Surgeon: Burnell Blanks, MD;  Location: [facility];  Service: Cardiovascular;  Laterality: N/A;  . prostate cancer    . SPINE SURGERY N/A 2000   [name]    Current Medications: Current Meds  Medication Sig  . apixaban (ELIQUIS) 5 MG TABS tablet Take 1 tablet (5 mg total) by mouth 2 (two) times daily.  [name] atorvastatin (LIPITOR) 80 MG tablet Take 1 tablet (80 mg total) by mouth daily at 6 PM.  . Cholecalciferol (VITAMIN D-3) 1000 UNITS CAPS Take 1 capsule by mouth daily. Take one tablet by mouth once daily.  . clopidogrel (PLAVIX) 75 MG tablet Take 1 tablet (75 mg total) by mouth daily with breakfast.  . lisinopril (PRINIVIL,ZESTRIL) 2.5 MG tablet Take 1 tablet (2.5 mg total) by mouth daily.  . Multiple Vitamin (MULTIVITAMIN) tablet Take 1 tablet by mouth daily.  . nitroGLYCERIN (NITROSTAT) 0.4 MG SL tablet Place 1 tablet (0.4 mg total) under the tongue every 5 (five) minutes x 3 doses as needed for chest pain.     Allergies:   Patient has no known allergies.   Social History   Social History  . Marital status: Married    Spouse name: N/A  . Number of children: N/A  . Years of education: N/A   Occupational History  . retired    Social History Main Topics  . Smoking status: Never Smoker  . Smokeless tobacco: Never Used  . Alcohol use No  . Drug use: No  . Sexual activity: Not Asked   Other Topics Concern  . None   Social History Narrative  . None     Family History:  The patient's family history includes Cancer (age of onset: 36) in his father.   ROS:   Please see the history of present illness.    Review of Systems  Musculoskeletal: Positive for back pain.  Neurological: Positive for loss of balance.   All other systems reviewed and are negative.   EKGs/Labs/Other Test  Reviewed:    EKG:  EKG is  ordered today.  The ekg ordered today demonstrates Sinus bradycardia, HR 52, normal axis, septal Q waves, IVCD, QTc 403 ms  Recent Labs: 11/20/2015: Magnesium 1.8 11/22/2015: Hemoglobin 13.6; Platelets 153; TSH 2.934 12/05/2015: BUN 20; Creat 1.30; Potassium 5.0; Sodium 141 01/14/2016: ALT 20   Recent Lipid Panel    Component Value Date/Time   CHOL 108 (L) 01/14/2016 0736   CHOL 174 07/03/2014 0820   TRIG 73 01/14/2016 0736   HDL 42 01/14/2016 0736   HDL 52 07/03/2014 0820   CHOLHDL 2.6 01/14/2016 0736   VLDL 15 01/14/2016 0736   LDLCALC 51 01/14/2016 0736   LDLCALC 100 (H) 07/03/2014 0820  Physical Exam:    VS:  BP (!) 130/50   Pulse (!) 52   Ht 5\' 10"  (1.778 m)   Wt 145 lb (65.8 kg)   SpO2 96%   BMI 20.81 kg/m     Wt Readings from Last 3 Encounters:  04/07/16 145 lb (65.8 kg)  03/11/16 144 lb (65.3 kg)  03/05/16 144 lb (65.3 kg)     Physical Exam  Constitutional: He is oriented to person, place, and time. He appears well-developed and well-nourished. No distress.  HENT:  Head: Normocephalic and atraumatic.  Eyes: No scleral icterus.  Neck: No JVD present.  Cardiovascular: Normal rate, regular rhythm and normal heart sounds.   No murmur heard. Pulmonary/Chest: Effort normal. He has no wheezes. He has no rales.  Abdominal: Soft. There is no tenderness.  Musculoskeletal: He exhibits no edema.  Neurological: He is alert and oriented to person, place, and time.  Skin: Skin is warm and dry.  Psychiatric: He has a normal mood and affect.    ASSESSMENT:    1. Coronary artery disease involving native coronary artery of native heart without angina pectoris   2. Paroxysmal atrial fibrillation (HCC)   3. Essential hypertension   4. Pure hypercholesterolemia    PLAN:    In order of problems listed above:  1. CAD - He is s/p Anterolateral STEMI in 6/17 treated with DES to the superior subbranch of D2. His EF was down post MI at 30-35.   Recent Echo with EF 55-60.  He is not on beta-blocker due to bradycardia.  He is doing well without angina.  Continue Clopidogrel, Apixaban, Atorvastatin, Lisinopril.  2. PAF - Maintaining NSR off Amiodarone.  If he has recurrent AF that is symptomatic, will need to refer to EP for help with rhythm control (?Dofetilide).  Continue Eliquis.  Check BMET, CBC today.  3. HTN - BP controlled.  BP readings from cardiac rehabilitation reviewed.    4. Hyperlipidemia -  LDL in 8/17 was 51. Continue statin.   Medication Adjustments/Labs and Tests Ordered: Current medicines are reviewed at length with the patient today.  Concerns regarding medicines are outlined above.  Medication changes, Labs and Tests ordered today are outlined in the Patient Instructions noted below. Patient Instructions  Medication Instructions:  Your physician recommends that you continue on your current medications as directed. Please refer to the Current Medication list given to you today.  Labwork: TODAY BMET, CBC   Testing/Procedures: NONE  Follow-Up: Your physician wants you to follow-up in: [place] DR. Angelena Form You will receive a reminder letter in the mail two months in advance. If you don't receive a letter, please call our office to schedule the follow-up appointment.  Any Other Special Instructions Will Be Listed Below (If Applicable).  If you need a refill on your cardiac medications before your next appointment, please call your pharmacy.  Signed, Richardson Dopp, PA-C  04/07/2016 4:41 PM    [facility] [address], [place], [place]  [address] Phone: [phone]; Fax: [phone]

## 2016-04-08 LAB — BASIC METABOLIC PANEL
BUN: 18 mg/dL (ref 7–25)
CALCIUM: 9 mg/dL (ref 8.6–10.3)
CHLORIDE: 106 mmol/L (ref 98–110)
CO2: 29 mmol/L (ref 20–31)
Creat: 1.01 mg/dL (ref 0.70–1.11)
GLUCOSE: 87 mg/dL (ref 65–99)
Potassium: 4.2 mmol/L (ref 3.5–5.3)
SODIUM: 142 mmol/L (ref 135–146)

## 2016-04-09 ENCOUNTER — Telehealth: Payer: Self-pay | Admitting: *Deleted

## 2016-04-09 ENCOUNTER — Encounter (HOSPITAL_COMMUNITY)
Admission: RE | Admit: 2016-04-09 | Discharge: 2016-04-09 | Disposition: A | Discharge status: Home / Self Care | Payer: PPO | Source: Ambulatory Visit | Attending: Cardiovascular Disease | Admitting: Cardiovascular Disease

## 2016-04-09 ENCOUNTER — Encounter (HOSPITAL_COMMUNITY): Payer: PPO

## 2016-04-09 DIAGNOSIS — I252 Old myocardial infarction: Secondary | ICD-10-CM | POA: Diagnosis not present

## 2016-04-09 DIAGNOSIS — Z955 Presence of coronary angioplasty implant and graft: Secondary | ICD-10-CM

## 2016-04-09 DIAGNOSIS — I213 ST elevation (STEMI) myocardial infarction of unspecified site: Secondary | ICD-10-CM

## 2016-04-09 NOTE — Progress Notes (Signed)
Cardiac Individual Treatment Plan  Patient Details  Name: Henry Franklin. MRN: [id number] Date of Birth: [birth year] Referring Provider:   Flowsheet Row CARDIAC REHAB PHASE II EXERCISE from 01/28/2016 in [place]  Referring Provider  [place]      Initial Encounter Date:  [place] PHASE II EXERCISE from 01/28/2016 in [facility]  Date  01/29/16  Referring Provider  [place]      Visit Diagnosis: ST elevation myocardial infarction (STEMI), unspecified artery ([place])  Stented coronary artery  Patient's Home Medications on Admission:  Current Outpatient Prescriptions:  .  apixaban (ELIQUIS) 5 MG TABS tablet, Take 1 tablet (5 mg total) by mouth 2 (two) times daily., Disp: 180 tablet, Rfl: 3 .  atorvastatin (LIPITOR) 80 MG tablet, Take 1 tablet (80 mg total) by mouth daily at 6 PM., Disp: 30 tablet, Rfl: 11 .  Cholecalciferol (VITAMIN D-3) 1000 UNITS CAPS, Take 1 capsule by mouth daily. Take one tablet by mouth once daily., Disp: , Rfl:  .  clopidogrel (PLAVIX) 75 MG tablet, Take 1 tablet (75 mg total) by mouth daily with breakfast., Disp: 30 tablet, Rfl: 11 .  lisinopril (PRINIVIL,ZESTRIL) 2.5 MG tablet, Take 1 tablet (2.5 mg total) by mouth daily., Disp: 30 tablet, Rfl: 11 .  Multiple Vitamin (MULTIVITAMIN) tablet, Take 1 tablet by mouth daily., Disp: , Rfl:  .  nitroGLYCERIN (NITROSTAT) 0.4 MG SL tablet, Place 1 tablet (0.4 mg total) under the tongue every 5 (five) minutes x 3 doses as needed for chest pain., Disp: 25 tablet, Rfl: 3  Past Medical History: Past Medical History:  Diagnosis Date  . CAD (coronary artery disease)    a. STEMI 6/17: LHC - oLAD 10, mLAD 50, dLAD 20, D2 inf subbranch 70 + sup subbranch 100, pRCA 20, oRPDA 40, EF 35-45% >> PCI:  2.25 x 12 mm resolute integrity DES to the superior subbranch of D2   . Essential and other specified forms of tremor   . External hemorrhoids without  mention of complication   . History of ST elevation myocardial infarction (STEMI)    a. Ant-Lat STEMI >> DES to D2 subbranch  . History of stroke   . History of TIA (transient ischemic attack)   . HTN (hypertension)   . Hyperlipidemia   . Ischemic cardiomyopathy    a. Echo 6/17: Mid and distal anterior/inferior, septal and apical HK, mild concentric LVH, EF 30-35%, mild LAE  //  b. Echo 9/17 mild focal basal septal hypertrophy, EF 55-60%, normal wall motion, grade 1 diastolic dysfunction, mildly dilated aortic root (37 mm), normal RVSF, PASP 29 mmHg  . Lumbago   . Malignant neoplasm of prostate ([place])   . Nontraumatic rupture of tendons of biceps (long head)   . Paroxysmal atrial fibrillation (HCC)    during admit for STEMI 6/17 >> triple Rx high risk >> ASA + Plavix x 30 days post MI, then Eliquis + Plavix  . Sebaceous cyst   . Supraventricular premature beats   . Unspecified anomaly of tooth position     Tobacco Use: History  Smoking Status  . Never Smoker  Smokeless Tobacco  . Never Used    Labs: Recent Review Flowsheet Data    Labs for ITP Cardiac and Pulmonary Rehab Latest Ref Rng & Units 07/03/2014 11/19/2015 11/20/2015 12/05/2015 01/14/2016   Cholestrol 125 - 200 mg/dL 174 152 113 91(L) 108(L)   LDLCALC <130 mg/dL 100(H) 76 56 36  51   HDL >=40 mg/dL 52 48 36(L) 46 42   Trlycerides <150 mg/dL 111 139 107 47 73   TCO2 0 - 100 mmol/L - 31 - - -      Capillary Blood Glucose: No results found for: GLUCAP   Exercise Target Goals:    Exercise Program Goal: Individual exercise prescription set with THRR, safety & activity barriers. Participant demonstrates ability to understand and report RPE using BORG scale, to self-measure pulse accurately, and to acknowledge the importance of the exercise prescription.  Exercise Prescription Goal: Starting with aerobic activity 30 plus minutes a day, 3 days per week for initial exercise prescription. Provide home exercise prescription and  guidelines that participant acknowledges understanding prior to discharge.  Activity Barriers & Risk Stratification:     Activity Barriers & Cardiac Risk Stratification - 01/22/16 1203      Activity Barriers & Cardiac Risk Stratification   Cardiac Risk Stratification High      6 Minute Walk:     6 Minute Walk    Row Name 01/28/16 1400 01/29/16 0838       6 Minute Walk   Phase Initial  -    Distance 1281 feet -    Walk Time 6 minutes -    MPH 2.42 -    METS 2.2 -    RPE 11 -    VO2 Peak 7.72  -    Resting HR 48 bpm -    Resting BP 108/70 -    Max Ex. HR 82 bpm -    Max Ex. BP 138/76 -    2 Minute Post BP 124/60 -       Initial Exercise Prescription:     Initial Exercise Prescription - 01/29/16 0800      Date of Initial Exercise RX and Referring Provider   Date 01/29/16   Referring Provider [place]     NuStep   Level 1   Minutes 10   METs 2     Arm Ergometer   Level 1   Watts 23   Minutes 10     Track   Laps 7   Minutes 10     Prescription Details   Frequency (times per week) 3   Duration Progress to 30 minutes of continuous aerobic without signs/symptoms of physical distress     Intensity   THRR 40-80% of Max Heartrate 53-106   Ratings of Perceived Exertion 11-13     Progression   Progression Continue to progress workloads to maintain intensity without signs/symptoms of physical distress.     Resistance Training   Training Prescription Yes   Weight 2   Reps 10-12      Perform Capillary Blood Glucose checks as needed.  Exercise Prescription Changes:     Exercise Prescription Changes    Row Name 02/11/16 1600 03/10/16 1600 04/08/16 1100         Exercise Review   Progression Yes Yes Yes       Response to Exercise   Blood Pressure (Admit) 130/70 128/60 120/70     Blood Pressure (Exercise) 120/70 130/64 128/70     Blood Pressure (Exit) 104/60 116/80 102/70     Heart Rate (Admit) 59 bpm 66 bpm 60 bpm     Heart Rate (Exercise) 86  bpm 85 bpm 80 bpm     Heart Rate (Exit) 68 bpm 66 bpm 65 bpm     Rating of Perceived Exertion (Exercise) 12 13 13  Comments Reviewed HEP on 02/08/16 Reviewed HEP on 02/08/16 Reviewed HEP on 02/08/16     Duration Progress to 30 minutes of continuous aerobic without signs/symptoms of physical distress Progress to 30 minutes of continuous aerobic without signs/symptoms of physical distress Progress to 30 minutes of continuous aerobic without signs/symptoms of physical distress     Intensity THRR unchanged THRR unchanged THRR unchanged       Progression   Average METs 2.3 2.8 2.8       Resistance Training   Training Prescription Yes Yes Yes     Weight 2 2 2      Reps 10-12 10-12 10-12       NuStep   Level 3 5 5      Minutes 10 10 15      METs 2.3 3 3.3       Arm Ergometer   Level 1 2 -     Watts 23 23 -     Minutes 10 10 -       Rower   Level  -  - 1     Watts  -  - 21     Minutes  -  - 10     METs  -  - 4.7       Track   Laps 7 8 6      Minutes 10 10 6        Home Exercise Plan   Plans to continue exercise at Home  Reviewed on 02/08/16. See progress note Home  Reviewed on 02/08/16. See progress note Home  Reviewed on 02/08/16. See progress note     Frequency Add 2 additional days to program exercise sessions. Add 2 additional days to program exercise sessions. Add 2 additional days to program exercise sessions.        Exercise Comments:     Exercise Comments    Row Name 02/11/16 1659 03/10/16 1637 04/08/16 1152       Exercise Comments Reviewed METs and goals. Pt is tolerating exercise fairly well; will continue to monitor exercise progression. Reviewed METs and goals. Pt is tolerating exercise fairly well; will continue to monitor exercise progression. Reviewed METs and goals. Pt is tolerating exercise fairly well; will continue to monitor exercise progression.        Discharge Exercise Prescription (Final Exercise Prescription Changes):     Exercise Prescription Changes -  04/08/16 1100      Exercise Review   Progression Yes     Response to Exercise   Blood Pressure (Admit) 120/70   Blood Pressure (Exercise) 128/70   Blood Pressure (Exit) 102/70   Heart Rate (Admit) 60 bpm   Heart Rate (Exercise) 80 bpm   Heart Rate (Exit) 65 bpm   Rating of Perceived Exertion (Exercise) 13   Comments Reviewed HEP on 02/08/16   Duration Progress to 30 minutes of continuous aerobic without signs/symptoms of physical distress   Intensity THRR unchanged     Progression   Average METs 2.8     Resistance Training   Training Prescription Yes   Weight 2   Reps 10-12     NuStep   Level 5   Minutes 15   METs 3.3     Arm Ergometer   Level --   Watts --   Minutes --     Rower   Level 1   Watts 21   Minutes 10   METs 4.7     Track   Laps 6   Minutes  6     Home Exercise Plan   Plans to continue exercise at Home  Reviewed on 02/08/16. See progress note   Frequency Add 2 additional days to program exercise sessions.      Nutrition:  Target Goals: Understanding of nutrition guidelines, daily intake of sodium 1500mg , cholesterol 200mg , calories 30% from fat and 7% or less from saturated fats, daily to have 5 or more servings of fruits and vegetables.  Biometrics:     Pre Biometrics - 01/30/16 1630      Pre Biometrics   Height 5' 8.75" (1.746 m)   Weight 143 lb 15.4 oz (65.3 kg)   Waist Circumference 32.5 inches   Hip Circumference 37.5 inches   Waist to Hip Ratio 0.87 %   BMI (Calculated) 21.5   Triceps Skinfold 8 mm   % Body Fat 19.8 %   Grip Strength 26 kg   Flexibility 0 in   Single Leg Stand 0 seconds       Nutrition Therapy Plan and Nutrition Goals:     Nutrition Therapy & Goals - 01/30/16 0936      Nutrition Therapy   Diet High Calorie, High Protein     Personal Nutrition Goals   Personal Goal #1 1-2 lb wt gain to wt gain goal of 6-12 lb at graduation from [place], educate  and counsel regarding individualized specific dietary modifications aiming towards targeted core components such as weight, hypertension, lipid management, diabetes, heart failure and other comorbidities.   Expected Outcomes Short Term Goal: Understand basic principles of dietary content, such as calories, fat, sodium, cholesterol and nutrients.;Long Term Goal: Adherence to prescribed nutrition plan.      Nutrition Discharge: Nutrition Scores:     Nutrition Assessments - 01/30/16 0936      MEDFICTS Scores   Pre Score 24      Nutrition Goals Re-Evaluation:   Psychosocial: Target Goals: Acknowledge presence or absence of depression, maximize coping skills, provide positive support system. Participant is able to verbalize types and ability to use techniques and skills needed for reducing stress and depression.  Initial Review & Psychosocial Screening:     Initial Psych Review & Screening - 01/29/16 [place]? Yes     Barriers   Psychosocial barriers to participate in program There are no identifiable barriers or psychosocial needs.     Screening Interventions   Interventions Encouraged to exercise      Quality of Life Scores:     Quality of Life - 02/27/16 1703      Quality of Life Scores   GLOBAL Pre --  pt quality of life somewhat reduced due to health related anxiety. pt has overall hopeful, positive outlook.  pt is most concerned about the decline in his physical condition, balance and stamina.       PHQ-9: Recent Review Flowsheet Data    Depression screen [facility] 2/9 01/02/2016 12/18/2015 07/11/2015 05/31/2014 04/05/2014   Decreased Interest 0 0 0 0 0   Down, Depressed, Hopeless 0 0 0 0 0   PHQ - 2 Score 0 0 0 0 0      Psychosocial Evaluation and Intervention:   Psychosocial Re-Evaluation:     Psychosocial Re-Evaluation    Row Name 02/15/16 1421 03/12/16 1105 04/08/16 0951         Psychosocial Re-Evaluation    Interventions Encouraged to attend  Cardiac Rehabilitation for the exercise Encouraged to attend Cardiac Rehabilitation for the exercise Encouraged to attend Cardiac Rehabilitation for the exercise     Comments no psychosocial needs identified, no psychosocial interventions necessary.  back pain limits activity.  pt is eager to return to his previous level of exertion.  no psychsocial needs identified, no psychosocial interventions necessary.  Back pain limits activity.  pt feels his strength is building.  pt often overdoes activity, pt cautioned to take rest breaks when needed and avoid overexertion.   no psychosocial needs identified, no psychosocial interventions necessary.  pt is building increased strength and stamina. pt stability is incresed with use of assistive device while walking at cardiac rehab.     Continued Psychosocial Services Needed No No No        Vocational Rehabilitation: Provide vocational rehab assistance to qualifying candidates.   Vocational Rehab Evaluation & Intervention:     Vocational Rehab - 01/29/16 0850      Initial Vocational Rehab Evaluation & Intervention   Assessment shows need for Vocational Rehabilitation No      Education: Education Goals: Education classes will be provided on a weekly basis, covering required topics. Participant will state understanding/return demonstration of topics presented.  Learning Barriers/Preferences:     Learning Barriers/Preferences - 01/22/16 1203      Learning Barriers/Preferences   Learning Barriers Sight   Learning Preferences Group Instruction;Individual Instruction;Verbal Instruction;Written Material      Education Topics: Count Your Pulse:  -Group instruction provided by verbal instruction, demonstration, patient participation and written materials to support subject.  Instructors address importance of being able to find your pulse and how to count your pulse when at home without a heart monitor.  Patients get  hands on experience counting their pulse with staff help and individually. Flowsheet Row CARDIAC REHAB PHASE II EXERCISE from 04/09/2016 in [facility]  Date  02/01/16  Educator  Barnet Pall, RN  Instruction Review Code  2- meets goals/outcomes      Heart Attack, Angina, and Risk Factor Modification:  -Group instruction provided by verbal instruction, video, and written materials to support subject.  Instructors address signs and symptoms of angina and heart attacks.    Also discuss risk factors for heart disease and how to make changes to improve heart health risk factors. Flowsheet Row CARDIAC REHAB PHASE II EXERCISE from 04/09/2016 in [facility]  Date  03/19/16  Instruction Review Code  2- meets goals/outcomes      Functional Fitness:  -Group instruction provided by verbal instruction, demonstration, patient participation, and written materials to support subject.  Instructors address safety measures for doing things around the house.  Discuss how to get up and down off the floor, how to pick things up properly, how to safely get out of a chair without assistance, and balance training. Flowsheet Row CARDIAC REHAB PHASE II EXERCISE from 04/09/2016 in [facility]  Date  02/15/16  Instruction Review Code  2- meets goals/outcomes      Meditation and Mindfulness:  -Group instruction provided by verbal instruction, patient participation, and written materials to support subject.  Instructor addresses importance of mindfulness and meditation practice to help reduce stress and improve awareness.  Instructor also leads participants through a meditation exercise.    Stretching for Flexibility and Mobility:  -Group instruction provided by verbal instruction, patient participation, and written materials to support subject.  Instructors lead participants through series of stretches  that are designed to  increase flexibility thus improving mobility.  These stretches are additional exercise for major muscle groups that are typically performed during regular warm up and cool down.   Hands Only CPR Anytime:  -Group instruction provided by verbal instruction, video, patient participation and written materials to support subject.  Instructors co-teach with AHA video for hands only CPR.  Participants get hands on experience with mannequins.   Nutrition I class: Heart Healthy Eating:  -Group instruction provided by PowerPoint slides, verbal discussion, and written materials to support subject matter. The instructor gives an explanation and review of the Therapeutic Lifestyle Changes diet recommendations, which includes a discussion on lipid goals, dietary fat, sodium, fiber, plant stanol/sterol esters, sugar, and the components of a well-balanced, healthy diet. Flowsheet Row CARDIAC REHAB PHASE II EXERCISE from 04/09/2016 in [place]  Date  02/05/16  Educator  RD  Instruction Review Code  2- meets goals/outcomes      Nutrition II class: Lifestyle Skills:  -Group instruction provided by PowerPoint slides, verbal discussion, and written materials to support subject matter. The instructor gives an explanation and review of label reading, grocery shopping for heart health, heart healthy recipe modifications, and ways to make healthier choices when eating out. 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 The instructor gives an explanation and review of diabetes co-morbidities, pre- and post-prandial blood glucose  goals, pre-exercise blood glucose goals, signs, symptoms, and treatment of hypoglycemia and hyperglycemia, and foot care basics.   Diabetes Blitz:  -Group instruction provided by PowerPoint slides, verbal discussion, and written materials to support subject matter. The instructor gives an explanation and review of the physiology behind type 1 and type 2 diabetes, diabetes medications and rational behind using different medications, pre- and post-prandial blood glucose recommendations and Hemoglobin A1c goals, diabetes diet, and exercise including blood glucose guidelines for exercising safely.    Portion Distortion:  -Group instruction provided by PowerPoint slides, verbal discussion, written materials, and food models to support subject matter. The instructor gives an explanation of serving size versus portion size, changes in portions sizes over the last 20 years, and what consists of a serving from each food group.   Stress Management:  -Group instruction provided by verbal instruction, video, and written materials to support subject matter.  Instructors review role of stress in heart disease and how to cope with stress positively.     Exercising on Your Own:  -Group instruction provided by verbal instruction, power point, and written materials to support subject.  Instructors discuss benefits of exercise, components of exercise, frequency and intensity of exercise, and end points for exercise.  Also discuss use of nitroglycerin and activating EMS.  Review options of places to exercise outside of rehab.  Review guidelines for sex with heart disease.   Cardiac Drugs I:  -Group instruction provided by verbal instruction and written materials to support subject.  Instructor reviews cardiac drug classes: antiplatelets, anticoagulants, beta blockers, and statins.  Instructor discusses reasons, side effects, and lifestyle considerations for each drug class.   Cardiac Drugs II:  -Group  instruction provided by verbal instruction and written materials to support  subject.  Instructor reviews cardiac drug classes: angiotensin converting enzyme inhibitors (ACE-I), angiotensin II receptor blockers (ARBs), nitrates, and calcium channel blockers.  Instructor discusses reasons, side effects, and lifestyle considerations for each drug class. Flowsheet Row CARDIAC REHAB PHASE II EXERCISE from 04/09/2016 in [place]  Date  04/09/16  Educator  pharmacist  Instruction Review Code  2- meets goals/outcomes      Anatomy and Physiology of the Circulatory System:  -Group instruction provided by verbal instruction, video, and written materials to support subject.  Reviews functional anatomy of heart, how it relates to various diagnoses, and what role the heart plays in the overall system. Flowsheet Row CARDIAC REHAB PHASE II EXERCISE from 04/09/2016 in [place]  Date  02/06/16  Instruction Review Code  2- meets goals/outcomes      Knowledge Questionnaire Score:     Knowledge Questionnaire Score - 01/30/16 1638      Knowledge Questionnaire Score   Pre Score 21/24      Core Components/Risk Factors/Patient Goals at Admission:     Personal Goals and Risk Factors at Admission - 01/22/16 1204      Core Components/Risk Factors/Patient Goals on Admission   Sedentary Yes   Intervention Provide advice, education, support and counseling about physical activity/exercise needs.;Develop an individualized exercise prescription for aerobic and resistive training based on initial evaluation findings, risk stratification, comorbidities and participant's personal goals.   Expected Outcomes Achievement of increased cardiorespiratory fitness and enhanced flexibility, muscular endurance and strength shown through measurements of functional capacity and personal statement of participant.   Increase Strength and Stamina Yes   Intervention  Provide advice, education, support and counseling about physical activity/exercise needs.;Develop an individualized exercise prescription for aerobic and resistive training based on initial evaluation findings, risk stratification, comorbidities and participant's personal goals.   Expected Outcomes Achievement of increased cardiorespiratory fitness and enhanced flexibility, muscular endurance and strength shown through measurements of functional capacity and personal statement of participant.   Hypertension Yes   Intervention Provide education on lifestyle modifcations including regular physical activity/exercise, weight management, moderate sodium restriction and increased consumption of fresh fruit, vegetables, and low fat dairy, alcohol moderation, and smoking cessation.;Monitor prescription use compliance.   Expected Outcomes Short Term: Continued assessment and intervention until BP is < 140/56mm HG in hypertensive participants. < 130/56mm HG in hypertensive participants with diabetes, heart failure or chronic kidney disease.;Long Term: Maintenance of blood pressure at goal levels.   Lipids Yes   Intervention Provide education and support for participant on nutrition & aerobic/resistive exercise along with prescribed medications to achieve LDL 70mg , HDL >40mg .   Expected Outcomes Short Term: Participant states understanding of desired cholesterol values and is compliant with medications prescribed. Participant is following exercise prescription and nutrition guidelines.;Long Term: Cholesterol controlled with medications as prescribed, with individualized exercise RX and with personalized nutrition plan. Value goals: LDL < 70mg , HDL > 40 mg.      Core Components/Risk Factors/Patient Goals Review:      Goals and Risk Factor Review    Row Name 02/11/16 1702 03/12/16 1639 04/08/16 1152         Core Components/Risk Factors/Patient Goals Review   Personal Goals Review Other;Increase Strength and  Stamina Other;Increase Strength and Stamina  -     Review Reviewed HEP in which would improve cardiovascular endurance and strength Pt states "getting stronger" and doing some walking at home. Pt was introduced to the rower machine and pt is  tolerating it well     Expected Outcomes Pt will maintain or increase muscle mass and continue to improve in balance/stability Pt will continue to walk at home and increase musculoskeletal strength Pt will continue to increase in cardiovascular fitness and functional capacity        Core Components/Risk Factors/Patient Goals at Discharge (Final Review):      Goals and Risk Factor Review - 04/08/16 1152      Core Components/Risk Factors/Patient Goals Review   Review Pt was introduced to the rower machine and pt is tolerating it well   Expected Outcomes Pt will continue to increase in cardiovascular fitness and functional capacity      ITP Comments:     ITP Comments    Row Name 01/22/16 1151           ITP Comments Dr. Fransico Him, Medical Director          Comments: Pt is making expected progress toward personal goals after completing 27 sessions. Recommend continued exercise and life style modification education including  stress management and relaxation techniques to decrease cardiac risk profile.

## 2016-04-09 NOTE — Telephone Encounter (Signed)
DPR ok to s/w wife and lmom. Lmom lab work ok and to continue on current Tx plan. Any questions feel free to call back [phone].

## 2016-04-10 ENCOUNTER — Other Ambulatory Visit: Payer: Self-pay

## 2016-04-10 NOTE — Patient Outreach (Signed)
[place] [facility]) Care Management  [place]  04/10/2016   Hodges Treiber. [birth year] [id number]   CASE CLOSURE:  Subjective: Telephone call to patient for follow up.  HIPAA verified with patient. Patient states he is doing well. Patient reports he has another week to 2 with cardiac rehab.  Patient states he had a follow up visit with cardiology this week and his blood work was good. Patient states he saw his primary MD, Dr. Quay Burow last month. Patient states he continues to adhere to a low sodium, heart healthy diet.  Patient states he and his wife eat out 1 time per week. Patient states he found out he was eligible for silver fit and states he started going to the [employer] with his wife. Patient states his current weight has been fairly stable.  Patient denies any falls.  Patient denies any new symptoms.  RNCM reviewed with patient signs/ symptoms of heart attack.  Advised to call 911 for heart attack symptoms.  RNCM discussed with patient keeping nitroglycerin tablets current and to keep them with him. Patient read on nitroglycerin bottle that he has plenty of refills and that tablets are within date.  RNCM reviewed with patient signs of abnormal bleeding. Advised to contact doctor immediately.  Patient verbally agreed to closing out [facility] care management program/ follow up with RNCM. Patient states he feels he is managing his care well. RNCM verified with patient that he has contact phone number for [facility] care management if needed.  Objective:   Encounter Medications:  Outpatient Encounter Prescriptions as of 04/10/2016  Medication Sig  . apixaban (ELIQUIS) 5 MG TABS tablet Take 1 tablet (5 mg total) by mouth 2 (two) times daily.  [name] atorvastatin (LIPITOR) 80 MG tablet Take 1 tablet (80 mg total) by mouth daily at 6 PM.  . Cholecalciferol (VITAMIN D-3) 1000 UNITS CAPS Take 1 capsule by mouth daily. Take one tablet by mouth once daily.  . clopidogrel (PLAVIX) 75 MG tablet Take 1  tablet (75 mg total) by mouth daily with breakfast.  . lisinopril (PRINIVIL,ZESTRIL) 2.5 MG tablet Take 1 tablet (2.5 mg total) by mouth daily.  . Multiple Vitamin (MULTIVITAMIN) tablet Take 1 tablet by mouth daily.  . nitroGLYCERIN (NITROSTAT) 0.4 MG SL tablet Place 1 tablet (0.4 mg total) under the tongue every 5 (five) minutes x 3 doses as needed for chest pain.   No facility-administered encounter medications on file as of 04/10/2016.     Functional Status:  In your present state of health, do you have any difficulty performing the following activities: 01/02/2016  Hearing? N  Vision? N  Difficulty concentrating or making decisions? N  Walking or climbing stairs? N  Dressing or bathing? N  Doing errands, shopping? N  Preparing Food and eating ? N  Using the Toilet? N  In the past six months, have you accidently leaked urine? N  Do you have problems with loss of bowel control? N  Managing your Medications? Y  Managing your Finances? N  Housekeeping or managing your Housekeeping? Y  Some recent data might be hidden   Fall Risk  04/10/2016 01/02/2016 01/02/2016 12/18/2015 07/11/2015  Falls in the past year? (No Data) - Yes Yes No  Number falls in past yr: 1 - 1 1 -  Injury with Fall? - (No Data) No No -  Risk for fall due to : Impaired balance/gait Impaired balance/gait - - -  Risk for fall due to (comments): - patient reports  he has had problems with his balance for approximately 3 years. Patient reports he is currently in outpatient physical therapy for his balance issues - - -  Follow up Falls prevention discussed Falls prevention discussed - - -    Fall/Depression Screening: PHQ 2/9 Scores 01/02/2016 12/18/2015 07/11/2015 05/31/2014 04/05/2014 11/10/2012 09/07/2012  PHQ - 2 Score 0 0 0 0 0 0 0    Assessment: Patient is self managing care and has met care plan goals.  Plan: RNCM will refer patient to case management assistant to close due to goals being met. RNCM will notify patients  primary MD of closure.   Quinn Plowman RN,BSN,CCM [facility] Telephonic  [phone]

## 2016-04-11 ENCOUNTER — Encounter (HOSPITAL_COMMUNITY): Payer: PPO

## 2016-04-11 ENCOUNTER — Encounter (HOSPITAL_COMMUNITY)
Admission: RE | Admit: 2016-04-11 | Discharge: 2016-04-11 | Disposition: A | Discharge status: Home / Self Care | Payer: PPO | Source: Ambulatory Visit | Attending: Cardiovascular Disease | Admitting: Cardiovascular Disease

## 2016-04-11 DIAGNOSIS — I252 Old myocardial infarction: Secondary | ICD-10-CM | POA: Diagnosis not present

## 2016-04-11 DIAGNOSIS — Z955 Presence of coronary angioplasty implant and graft: Secondary | ICD-10-CM

## 2016-04-11 DIAGNOSIS — I213 ST elevation (STEMI) myocardial infarction of unspecified site: Secondary | ICD-10-CM

## 2016-04-14 ENCOUNTER — Encounter (HOSPITAL_COMMUNITY)
Admission: RE | Admit: 2016-04-14 | Discharge: 2016-04-14 | Disposition: A | Discharge status: Home / Self Care | Payer: PPO | Source: Ambulatory Visit | Attending: Cardiovascular Disease | Admitting: Cardiovascular Disease

## 2016-04-14 ENCOUNTER — Encounter (HOSPITAL_COMMUNITY): Payer: PPO

## 2016-04-14 DIAGNOSIS — I252 Old myocardial infarction: Secondary | ICD-10-CM | POA: Diagnosis not present

## 2016-04-14 DIAGNOSIS — Z955 Presence of coronary angioplasty implant and graft: Secondary | ICD-10-CM

## 2016-04-14 DIAGNOSIS — I213 ST elevation (STEMI) myocardial infarction of unspecified site: Secondary | ICD-10-CM

## 2016-04-16 ENCOUNTER — Encounter (HOSPITAL_COMMUNITY): Payer: PPO

## 2016-04-16 ENCOUNTER — Encounter (HOSPITAL_COMMUNITY)
Admission: RE | Admit: 2016-04-16 | Discharge: 2016-04-16 | Disposition: A | Discharge status: Home / Self Care | Payer: PPO | Source: Ambulatory Visit | Attending: Cardiovascular Disease | Admitting: Cardiovascular Disease

## 2016-04-16 DIAGNOSIS — I213 ST elevation (STEMI) myocardial infarction of unspecified site: Secondary | ICD-10-CM

## 2016-04-16 DIAGNOSIS — Z955 Presence of coronary angioplasty implant and graft: Secondary | ICD-10-CM

## 2016-04-16 DIAGNOSIS — I252 Old myocardial infarction: Secondary | ICD-10-CM | POA: Diagnosis not present

## 2016-04-18 ENCOUNTER — Encounter (HOSPITAL_COMMUNITY)
Admission: RE | Admit: 2016-04-18 | Discharge: 2016-04-18 | Disposition: A | Discharge status: Home / Self Care | Payer: PPO | Source: Ambulatory Visit | Attending: Cardiovascular Disease | Admitting: Cardiovascular Disease

## 2016-04-18 ENCOUNTER — Encounter (HOSPITAL_COMMUNITY): Payer: PPO

## 2016-04-18 DIAGNOSIS — I213 ST elevation (STEMI) myocardial infarction of unspecified site: Secondary | ICD-10-CM

## 2016-04-18 DIAGNOSIS — I252 Old myocardial infarction: Secondary | ICD-10-CM | POA: Diagnosis not present

## 2016-04-18 DIAGNOSIS — Z955 Presence of coronary angioplasty implant and graft: Secondary | ICD-10-CM

## 2016-04-21 ENCOUNTER — Encounter (HOSPITAL_COMMUNITY)
Admission: RE | Admit: 2016-04-21 | Discharge: 2016-04-21 | Disposition: A | Discharge status: Home / Self Care | Payer: PPO | Source: Ambulatory Visit | Attending: Cardiovascular Disease | Admitting: Cardiovascular Disease

## 2016-04-21 ENCOUNTER — Encounter (HOSPITAL_COMMUNITY): Payer: PPO

## 2016-04-21 DIAGNOSIS — I213 ST elevation (STEMI) myocardial infarction of unspecified site: Secondary | ICD-10-CM

## 2016-04-21 DIAGNOSIS — I252 Old myocardial infarction: Secondary | ICD-10-CM | POA: Diagnosis not present

## 2016-04-21 DIAGNOSIS — Z955 Presence of coronary angioplasty implant and graft: Secondary | ICD-10-CM

## 2016-04-23 ENCOUNTER — Encounter (HOSPITAL_COMMUNITY): Payer: PPO

## 2016-04-23 ENCOUNTER — Encounter (HOSPITAL_COMMUNITY)
Admission: RE | Admit: 2016-04-23 | Discharge: 2016-04-23 | Disposition: A | Discharge status: Home / Self Care | Payer: PPO | Source: Ambulatory Visit | Attending: Cardiovascular Disease | Admitting: Cardiovascular Disease

## 2016-04-23 DIAGNOSIS — Z955 Presence of coronary angioplasty implant and graft: Secondary | ICD-10-CM

## 2016-04-23 DIAGNOSIS — I213 ST elevation (STEMI) myocardial infarction of unspecified site: Secondary | ICD-10-CM

## 2016-04-23 DIAGNOSIS — I252 Old myocardial infarction: Secondary | ICD-10-CM | POA: Diagnosis not present

## 2016-04-25 ENCOUNTER — Encounter (HOSPITAL_COMMUNITY): Payer: PPO

## 2016-04-28 ENCOUNTER — Encounter (HOSPITAL_COMMUNITY): Payer: PPO

## 2016-04-28 ENCOUNTER — Telehealth: Payer: Self-pay | Admitting: Cardiovascular Disease

## 2016-04-28 ENCOUNTER — Encounter (HOSPITAL_COMMUNITY)
Admission: RE | Admit: 2016-04-28 | Discharge: 2016-04-28 | Disposition: A | Discharge status: Home / Self Care | Payer: PPO | Source: Ambulatory Visit | Attending: Cardiovascular Disease | Admitting: Cardiovascular Disease

## 2016-04-28 ENCOUNTER — Encounter (HOSPITAL_COMMUNITY): Payer: Self-pay

## 2016-04-28 VITALS — Wt 147.9 lb

## 2016-04-28 DIAGNOSIS — I213 ST elevation (STEMI) myocardial infarction of unspecified site: Secondary | ICD-10-CM

## 2016-04-28 DIAGNOSIS — I252 Old myocardial infarction: Secondary | ICD-10-CM | POA: Diagnosis not present

## 2016-04-28 DIAGNOSIS — Z955 Presence of coronary angioplasty implant and graft: Secondary | ICD-10-CM

## 2016-04-28 NOTE — Progress Notes (Signed)
Cardiac Individual Treatment Plan  Patient Details  Name: Henry Franklin. MRN: [id number] Date of Birth: [birth year] Referring Provider:   Flowsheet Row CARDIAC REHAB PHASE II EXERCISE from 01/28/2016 in [place]  Referring Provider  [place]      Initial Encounter Date:  [place] PHASE II EXERCISE from 01/28/2016 in [place]  Date  01/29/16  Referring Provider  [place]      Visit Diagnosis: ST elevation myocardial infarction (STEMI), unspecified artery ([place])  Stented coronary artery  Patient's Home Medications on Admission:  Current Outpatient Prescriptions:  .  apixaban (ELIQUIS) 5 MG TABS tablet, Take 1 tablet (5 mg total) by mouth 2 (two) times daily., Disp: 180 tablet, Rfl: 3 .  atorvastatin (LIPITOR) 80 MG tablet, Take 1 tablet (80 mg total) by mouth daily at 6 PM., Disp: 30 tablet, Rfl: 11 .  Cholecalciferol (VITAMIN D-3) 1000 UNITS CAPS, Take 1 capsule by mouth daily. Take one tablet by mouth once daily., Disp: , Rfl:  .  clopidogrel (PLAVIX) 75 MG tablet, Take 1 tablet (75 mg total) by mouth daily with breakfast., Disp: 30 tablet, Rfl: 11 .  lisinopril (PRINIVIL,ZESTRIL) 2.5 MG tablet, Take 1 tablet (2.5 mg total) by mouth daily., Disp: 30 tablet, Rfl: 11 .  Multiple Vitamin (MULTIVITAMIN) tablet, Take 1 tablet by mouth daily., Disp: , Rfl:  .  nitroGLYCERIN (NITROSTAT) 0.4 MG SL tablet, Place 1 tablet (0.4 mg total) under the tongue every 5 (five) minutes x 3 doses as needed for chest pain., Disp: 25 tablet, Rfl: 3  Past Medical History: Past Medical History:  Diagnosis Date  . CAD (coronary artery disease)    a. STEMI 6/17: LHC - oLAD 10, mLAD 50, dLAD 20, D2 inf subbranch 70 + sup subbranch 100, pRCA 20, oRPDA 40, EF 35-45% >> PCI:  2.25 x 12 mm resolute integrity DES to the superior subbranch of D2   . Essential and other specified forms of tremor   . External hemorrhoids without  mention of complication   . History of ST elevation myocardial infarction (STEMI)    a. Ant-Lat STEMI >> DES to D2 subbranch  . History of stroke   . History of TIA (transient ischemic attack)   . HTN (hypertension)   . Hyperlipidemia   . Ischemic cardiomyopathy    a. Echo 6/17: Mid and distal anterior/inferior, septal and apical HK, mild concentric LVH, EF 30-35%, mild LAE  //  b. Echo 9/17 mild focal basal septal hypertrophy, EF 55-60%, normal wall motion, grade 1 diastolic dysfunction, mildly dilated aortic root (37 mm), normal RVSF, PASP 29 mmHg  . Lumbago   . Malignant neoplasm of prostate ([place])   . Nontraumatic rupture of tendons of biceps (long head)   . Paroxysmal atrial fibrillation (HCC)    during admit for STEMI 6/17 >> triple Rx high risk >> ASA + Plavix x 30 days post MI, then Eliquis + Plavix  . Sebaceous cyst   . Supraventricular premature beats   . Unspecified anomaly of tooth position     Tobacco Use: History  Smoking Status  . Never Smoker  Smokeless Tobacco  . Never Used    Labs: Recent Review Flowsheet Data    Labs for ITP Cardiac and Pulmonary Rehab Latest Ref Rng & Units 07/03/2014 11/19/2015 11/20/2015 12/05/2015 01/14/2016   Cholestrol 125 - 200 mg/dL 174 152 113 91(L) 108(L)   LDLCALC <130 mg/dL 100(H) 76 56 36  51   HDL >=40 mg/dL 52 48 36(L) 46 42   Trlycerides <150 mg/dL 111 139 107 47 73   TCO2 0 - 100 mmol/L - 31 - - -      Capillary Blood Glucose: No results found for: GLUCAP   Exercise Target Goals:    Exercise Program Goal: Individual exercise prescription set with THRR, safety & activity barriers. Participant demonstrates ability to understand and report RPE using BORG scale, to self-measure pulse accurately, and to acknowledge the importance of the exercise prescription.  Exercise Prescription Goal: Starting with aerobic activity 30 plus minutes a day, 3 days per week for initial exercise prescription. Provide home exercise prescription and  guidelines that participant acknowledges understanding prior to discharge.  Activity Barriers & Risk Stratification:     Activity Barriers & Cardiac Risk Stratification - 01/22/16 1203      Activity Barriers & Cardiac Risk Stratification   Cardiac Risk Stratification High      6 Minute Walk:     6 Minute Walk    Row Name 01/28/16 1400 01/29/16 0838 04/29/16 1110     6 Minute Walk   Phase Initial  - Discharge   Distance 1281 feet - 1326 feet   Distance % Change  -  - 3.51 %   Walk Time 6 minutes - 6 minutes   # of Rest Breaks  -  - 0   MPH 2.42 - 2.5   METS 2.2 - 2.2   RPE 11 - 12   VO2 Peak 7.72  - 7.59   Symptoms  -  - No   Resting HR 48 bpm - 67 bpm   Resting BP 108/70 - 126/72   Max Ex. HR 82 bpm - 84 bpm   Max Ex. BP 138/76 - 116/70   2 Minute Post BP 124/60 - 108/62      Initial Exercise Prescription:     Initial Exercise Prescription - 01/29/16 0800      Date of Initial Exercise RX and Referring Provider   Date 01/29/16   Referring Provider [place]     NuStep   Level 1   Minutes 10   METs 2     Arm Ergometer   Level 1   Watts 23   Minutes 10     Track   Laps 7   Minutes 10     Prescription Details   Frequency (times per week) 3   Duration Progress to 30 minutes of continuous aerobic without signs/symptoms of physical distress     Intensity   THRR 40-80% of Max Heartrate 53-106   Ratings of Perceived Exertion 11-13     Progression   Progression Continue to progress workloads to maintain intensity without signs/symptoms of physical distress.     Resistance Training   Training Prescription Yes   Weight 2   Reps 10-12      Perform Capillary Blood Glucose checks as needed.  Exercise Prescription Changes:      Exercise Prescription Changes    Row Name 02/11/16 1600 03/10/16 1600 04/08/16 1100 04/29/16 1100       Exercise Review   Progression Yes Yes Yes Yes      Response to Exercise   Blood Pressure (Admit) 130/70 128/60  120/70 116/60    Blood Pressure (Exercise) 120/70 130/64 128/70 118/80    Blood Pressure (Exit) 104/60 116/80 102/70 116/80    Heart Rate (Admit) 59 bpm 66 bpm 60 bpm 80 bpm  Heart Rate (Exercise) 86 bpm 85 bpm 80 bpm 91 bpm    Heart Rate (Exit) 68 bpm 66 bpm 65 bpm 71 bpm    Rating of Perceived Exertion (Exercise) _0 Comments Reviewed HEP on 02/08/16 Reviewed HEP on 02/08/16 Reviewed HEP on 02/08/16 Reviewed HEP on 02/08/16    Duration Progress to 30 minutes of continuous aerobic without signs/symptoms of physical distress Progress to 30 minutes of continuous aerobic without signs/symptoms of physical distress Progress to 30 minutes of continuous aerobic without signs/symptoms of physical distress Progress to 30 minutes of continuous aerobic without signs/symptoms of physical distress    Intensity THRR unchanged THRR unchanged THRR unchanged THRR unchanged      Progression   Average METs 2.3 2.8 2.8 3.7      Resistance Training   Training Prescription Yes Yes Yes Yes    Weight _1 5lbs    Reps 10-12 10-12 10-12 10-12      Recumbant Bike   Level  -  -  - 4    Minutes  -  -  - 15    METs  -  -  - 2.9      NuStep   Level _2 -    Minutes _3 -    METs 2.3 3 3.3  -      Arm Ergometer   Level 1 2 -  -    Watts 23 23 -  -    Minutes 10 10 -  -      Rower   Level  -  - 1 5    Watts  -  - 21 22    Minutes  -  - 10 15    METs  -  - 4.7 4.7      Track   Laps _4 Minutes _5 Home Exercise Plan   Plans to continue exercise at Home  Reviewed on 02/08/16. See progress note Home  Reviewed on 02/08/16. See progress note Home  Reviewed on 02/08/16. See progress note Home  Reviewed on 02/08/16. See progress note    Frequency Add 2 additional days to program exercise sessions. Add 2 additional days to program exercise sessions. Add 2 additional days to program exercise sessions. Add 2 additional days to program exercise sessions.       Exercise  Comments:      Exercise Comments    Row Name 02/11/16 1659 03/10/16 1637 04/08/16 1152 04/29/16 1109     Exercise Comments Reviewed METs and goals. Pt is tolerating exercise fairly well; will continue to monitor exercise progression. Reviewed METs and goals. Pt is tolerating exercise fairly well; will continue to monitor exercise progression. Reviewed METs and goals. Pt is tolerating exercise fairly well; will continue to monitor exercise progression. Pt completed 36 sessions of cardiac rehab. Pt plans [facility] 4-5x/week.       Discharge Exercise Prescription (Final Exercise Prescription Changes):     Exercise Prescription Changes - 04/29/16 1100      Exercise Review   Progression Yes     Response to Exercise   Blood Pressure (Admit) 116/60   Blood Pressure (Exercise) 118/80   Blood Pressure (Exit) 116/80   Heart Rate (Admit) 80 bpm   Heart Rate (Exercise) 91 bpm   Heart Rate (Exit)  71 bpm   Rating of Perceived Exertion (Exercise) 11   Comments Reviewed HEP on 02/08/16   Duration Progress to 30 minutes of continuous aerobic without signs/symptoms of physical distress   Intensity THRR unchanged     Progression   Average METs 3.7     Resistance Training   Training Prescription Yes   Weight 5lbs   Reps 10-12     Recumbant Bike   Level 4   Minutes 15   METs 2.9     Rower   Level 5   Watts 22   Minutes 15   METs 4.7     Track   Laps 6   Minutes 6     Home Exercise Plan   Plans to continue exercise at [facility] on 02/08/16. See progress note   Frequency Add 2 additional days to program exercise sessions.      Nutrition:  Target Goals: Understanding of nutrition guidelines, daily intake of sodium <15104m, cholesterol <2037m calories 30% from fat and 7% or less from saturated fats, daily to have 5 or more servings of fruits and vegetables.  Biometrics:     Pre Biometrics - 01/30/16 1630      Pre  Biometrics   Height 5' 8.75" (1.746 m)   Weight 143 lb 15.4 oz (65.3 kg)   Waist Circumference 32.5 inches   Hip Circumference 37.5 inches   Waist to Hip Ratio 0.87 %   BMI (Calculated) 21.5   Triceps Skinfold 8 mm   % Body Fat 19.8 %   Grip Strength 26 kg   Flexibility 0 in   Single Leg Stand 0 seconds         Post Biometrics - 04/29/16 1105       Post  Biometrics   Weight 147 lb 14.9 oz (67.1 kg)   Waist Circumference 32 inches   Hip Circumference 36 inches   Waist to Hip Ratio 0.89 %   Triceps Skinfold 7 mm   % Body Fat 19.9 %   Grip Strength 30 kg   Flexibility 0 in   Single Leg Stand 0 seconds      Nutrition Therapy Plan and Nutrition Goals:     Nutrition Therapy & Goals - 01/30/16 0936      Nutrition Therapy   Diet High Calorie, High Protein     Personal Nutrition Goals   Personal Goal #1 1-2 lb wt gain to wt gain goal of 6-12 lb [facility] individualized specific dietary modifications aiming towards targeted core components such as weight, hypertension, lipid management, diabetes, heart failure and other comorbidities.   Expected Outcomes Short Term Goal: Understand basic principles of dietary content, such as calories, fat, sodium, cholesterol and nutrients.;Long Term Goal: Adherence to prescribed nutrition plan.      Nutrition Discharge: Nutrition Scores:     Nutrition Assessments - 05/07/16 0851      MEDFICTS Scores   Pre Score 24   Post Score 22   Score Difference -2      Nutrition Goals Re-Evaluation:     Nutrition Goals Re-Evaluation    Row Name 05/07/16 0851             Personal Goal #1 Re-Evaluation   Personal Goal #1 1-2 lb wt gain to wt gain goal of 6-12 lb at graduation from Cardiac Rehab       Goal Progress  Seen Yes       Comments Pt has gained 4 lb         Weight   Current Weight 147 lb 9.9 oz (67 kg)           Psychosocial: Target Goals: Acknowledge presence or absence of depression, maximize coping skills, provide positive support system. Participant is able to verbalize types and ability to use techniques and skills needed for reducing stress and depression.  Initial Review & Psychosocial Screening:     Initial Psych Review & Screening - 01/29/16 [place]? Yes     Barriers   Psychosocial barriers to participate in program There are no identifiable barriers or psychosocial needs.     Screening Interventions   Interventions Encouraged to exercise      Quality of Life Scores:     Quality of Life - 04/23/16 1641      Quality of Life Scores   Health/Function Pre 21.61 %   Health/Function Post 19.7 %   Health/Function % Change -8.84 %   Socioeconomic Pre 21.75 %   Socioeconomic Post 20.29 %   Socioeconomic % Change  -6.71 %   Psych/Spiritual Pre 24 %   Psych/Spiritual Post 21.36 %   Psych/Spiritual % Change -11 %   Family Pre 20.5 %   Family Post 23.9 %   Family % Change 16.59 %   GLOBAL Post 20.78 %      PHQ-9: Recent Review Flowsheet Data    Depression screen [facility] 2/9 04/28/2016 01/02/2016 12/18/2015 07/11/2015 05/31/2014   Decreased Interest 0 0 0 0 0   Down, Depressed, Hopeless 0 0 0 0 0   PHQ - 2 Score 0 0 0 0 0      Psychosocial Evaluation and Intervention:     Psychosocial Evaluation - 04/28/16 1436      Psychosocial Evaluation & Interventions   Comments no psychosocial needs identified, no interventions necessary    Continued Psychosocial Services Needed No     Discharge Psychosocial Assessment & Intervention   Discharge Continue support measures as needed      Psychosocial Re-Evaluation:     Psychosocial Re-Evaluation    Row Name 02/15/16 1421 03/12/16 1105 04/08/16 0951         Psychosocial Re-Evaluation   Interventions Encouraged to attend Cardiac Rehabilitation for the exercise Encouraged to attend  Cardiac Rehabilitation for the exercise Encouraged to attend Cardiac Rehabilitation for the exercise     Comments no psychosocial needs identified, no psychosocial interventions necessary.  back pain limits activity.  pt is eager to return to his previous level of exertion.  no psychsocial needs identified, no psychosocial interventions necessary.  Back pain limits activity.  pt feels his strength is building.  pt often overdoes activity, pt cautioned to take rest breaks when needed and avoid overexertion.   no psychosocial needs identified, no psychosocial interventions necessary.  pt is building increased strength and stamina. pt stability is incresed with use of assistive device while walking at cardiac rehab.     Continued Psychosocial Services Needed No No No        Vocational Rehabilitation: Provide vocational rehab assistance to qualifying candidates.   Vocational Rehab Evaluation & Intervention:     Vocational Rehab - 01/29/16 0850      Initial Vocational Rehab Evaluation & Intervention   Assessment shows need for Vocational Rehabilitation No      Education: Education Goals: Education  classes will be provided on a weekly basis, covering required topics. Participant will state understanding/return demonstration of topics presented.  Learning Barriers/Preferences:     Learning Barriers/Preferences - 01/22/16 1203      Learning Barriers/Preferences   Learning Barriers Sight   Learning Preferences Group Instruction;Individual Instruction;Verbal Instruction;Written Material      Education Topics: Count Your Pulse:  -Group instruction provided by verbal instruction, demonstration, patient participation and written materials to support subject.  Instructors address importance of being able to find your pulse and how to count your pulse when at home without a heart monitor.  Patients get hands on experience counting their pulse with staff help and individually. Flowsheet Row  CARDIAC REHAB PHASE II EXERCISE from 04/16/2016 in [place]  Date  02/01/16  Educator  Barnet Pall, RN  Instruction Review Code  2- meets goals/outcomes      Heart Attack, Angina, and Risk Factor Modification:  -Group instruction provided by verbal instruction, video, and written materials to support subject.  Instructors address signs and symptoms of angina and heart attacks.    Also discuss risk factors for heart disease and how to make changes to improve heart health risk factors. Flowsheet Row CARDIAC REHAB PHASE II EXERCISE from 04/16/2016 in [place]  Date  03/19/16  Instruction Review Code  2- meets goals/outcomes      Functional Fitness:  -Group instruction provided by verbal instruction, demonstration, patient participation, and written materials to support subject.  Instructors address safety measures for doing things around the house.  Discuss how to get up and down off the floor, how to pick things up properly, how to safely get out of a chair without assistance, and balance training. Flowsheet Row CARDIAC REHAB PHASE II EXERCISE from 04/16/2016 in [place]  Date  02/15/16  Instruction Review Code  2- meets goals/outcomes      Meditation and Mindfulness:  -Group instruction provided by verbal instruction, patient participation, and written materials to support subject.  Instructor addresses importance of mindfulness and meditation practice to help reduce stress and improve awareness.  Instructor also leads participants through a meditation exercise.    Stretching for Flexibility and Mobility:  -Group instruction provided by verbal instruction, patient participation, and written materials to support subject.  Instructors lead participants through series of stretches that are designed to increase flexibility thus improving mobility.  These stretches are additional exercise  for major muscle groups that are typically performed during regular warm up and cool down.   Hands Only CPR Anytime:  -Group instruction provided by verbal instruction, video, patient participation and written materials to support subject.  Instructors co-teach with AHA video for hands only CPR.  Participants get hands on experience with mannequins.   Nutrition I class: Heart Healthy Eating:  -Group instruction provided by PowerPoint slides, verbal discussion, and written materials to support subject matter. The instructor gives an explanation and review of the Therapeutic Lifestyle Changes diet recommendations, which includes a discussion on lipid goals, dietary fat, sodium, fiber, plant stanol/sterol esters, sugar, and the components of a well-balanced, healthy diet. Flowsheet Row CARDIAC REHAB PHASE II EXERCISE from 04/16/2016 in [place]  Date  02/05/16  Educator  RD  Instruction Review Code  2- meets goals/outcomes      Nutrition II class: Lifestyle Skills:  -Group instruction provided by PowerPoint slides, verbal discussion, and written materials to support subject matter. The instructor  gives an explanation and review of label reading, grocery shopping for heart health, heart healthy recipe modifications, and ways to make healthier choices when eating out. 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 The instructor gives an explanation and review of diabetes co-morbidities, pre- and post-prandial blood glucose goals, pre-exercise blood glucose goals, signs, symptoms, and treatment of hypoglycemia  and hyperglycemia, and foot care basics.   Diabetes Blitz:  -Group instruction provided by PowerPoint slides, verbal discussion, and written materials to support subject matter. The instructor gives an explanation and review of the physiology behind type 1 and type 2 diabetes, diabetes medications and rational behind using different medications, pre- and post-prandial blood glucose recommendations and Hemoglobin A1c goals, diabetes diet, and exercise including blood glucose guidelines for exercising safely.    Portion Distortion:  -Group instruction provided by PowerPoint slides, verbal discussion, written materials, and food models to support subject matter. The instructor gives an explanation of serving size versus portion size, changes in portions sizes over the last 20 years, and what consists of a serving from each food group.   Stress Management:  -Group instruction provided by verbal instruction, video, and written materials to support subject matter.  Instructors review role of stress in heart disease and how to cope with stress positively.   Flowsheet Row CARDIAC REHAB PHASE II EXERCISE from 04/16/2016 in [place]  Date  04/16/16  Instruction Review Code  2- meets goals/outcomes      Exercising on Your Own:  -Group instruction provided by verbal instruction, power point, and written materials to support subject.  Instructors discuss benefits of exercise, components of exercise, frequency and intensity of exercise, and end points for exercise.  Also discuss use of nitroglycerin and activating EMS.  Review options of places to exercise outside of rehab.  Review guidelines for sex with heart disease.   Cardiac Drugs I:  -Group instruction provided by verbal instruction and written materials to support subject.  Instructor reviews cardiac drug classes: antiplatelets, anticoagulants, beta blockers, and statins.  Instructor discusses reasons, side effects,  and lifestyle considerations for each drug class.   Cardiac Drugs II:  -Group instruction provided by verbal instruction and written materials to support subject.  Instructor reviews cardiac drug classes: angiotensin converting enzyme inhibitors (ACE-I), angiotensin II receptor blockers (ARBs), nitrates, and calcium channel blockers.  Instructor discusses reasons, side effects, and lifestyle considerations for each drug class. Flowsheet Row CARDIAC REHAB PHASE II EXERCISE from 04/16/2016 in [place]  Date  04/09/16  Educator  pharmacist  Instruction Review Code  2- meets goals/outcomes      Anatomy and Physiology of the Circulatory System:  -Group instruction provided by verbal instruction, video, and written materials to support subject.  Reviews functional anatomy of heart, how it relates to various diagnoses, and what role the heart plays in the overall system. Flowsheet Row CARDIAC REHAB PHASE II EXERCISE from 04/16/2016 in [place]  Date  02/06/16  Instruction Review Code  2- meets goals/outcomes      Knowledge Questionnaire Score:     Knowledge Questionnaire Score - 04/23/16 1637  Knowledge Questionnaire Score   Post Score 21/24      Core Components/Risk Factors/Patient Goals at Admission:     Personal Goals and Risk Factors at Admission - 01/22/16 1204      Core Components/Risk Factors/Patient Goals on Admission   Sedentary Yes   Intervention Provide advice, education, support and counseling about physical activity/exercise needs.;Develop an individualized exercise prescription for aerobic and resistive training based on initial evaluation findings, risk stratification, comorbidities and participant's personal goals.   Expected Outcomes Achievement of increased cardiorespiratory fitness and enhanced flexibility, muscular endurance and strength shown through measurements of functional capacity and  personal statement of participant.   Increase Strength and Stamina Yes   Intervention Provide advice, education, support and counseling about physical activity/exercise needs.;Develop an individualized exercise prescription for aerobic and resistive training based on initial evaluation findings, risk stratification, comorbidities and participant's personal goals.   Expected Outcomes Achievement of increased cardiorespiratory fitness and enhanced flexibility, muscular endurance and strength shown through measurements of functional capacity and personal statement of participant.   Hypertension Yes   Intervention Provide education on lifestyle modifcations including regular physical activity/exercise, weight management, moderate sodium restriction and increased consumption of fresh fruit, vegetables, and low fat dairy, alcohol moderation, and smoking cessation.;Monitor prescription use compliance.   Expected Outcomes Short Term: Continued assessment and intervention until BP is < 140/71m HG in hypertensive participants. < 130/876mHG in hypertensive participants with diabetes, heart failure or chronic kidney disease.;Long Term: Maintenance of blood pressure at goal levels.   Lipids Yes   Intervention Provide education and support for participant on nutrition & aerobic/resistive exercise along with prescribed medications to achieve LDL <7028mHDL >34m19m Expected Outcomes Short Term: Participant states understanding of desired cholesterol values and is compliant with medications prescribed. Participant is following exercise prescription and nutrition guidelines.;Long Term: Cholesterol controlled with medications as prescribed, with individualized exercise RX and with personalized nutrition plan. Value goals: LDL < 70mg30mL > 40 mg.      Core Components/Risk Factors/Patient Goals Review:      Goals and Risk Factor Review    Row Name 02/11/16 1702 03/12/16 1639 04/08/16 1152         Core  Components/Risk Factors/Patient Goals Review   Personal Goals Review Other;Increase Strength and Stamina Other;Increase Strength and Stamina  -     Review Reviewed HEP in which would improve cardiovascular endurance and strength Pt states "getting stronger" and doing some walking at home. Pt was introduced to the rower machine and pt is tolerating it well     Expected Outcomes Pt will maintain or increase muscle mass and continue to improve in balance/stability Pt will continue to walk at home and increase musculoskeletal strength Pt will continue to increase in cardiovascular fitness and functional capacity        Core Components/Risk Factors/Patient Goals at Discharge (Final Review):      Goals and Risk Factor Review - 04/08/16 1152      Core Components/Risk Factors/Patient Goals Review   Review Pt was introduced to the rower machine and pt is tolerating it well   Expected Outcomes Pt will continue to increase in cardiovascular fitness and functional capacity      ITP Comments:     ITP Comments    Row Name 01/22/16 1151           ITP Comments Dr. TraciFransico Himical Director          Comments: Pt graduated from cardiac rehab program  today with completion of 36 exercise sessions in Phase II. Pt maintained good attendance and progressed nicely during his participation in rehab as evidenced by increased MET level.   Medication list reconciled. Repeat  PHQ score- 0 .  Pt has made significant lifestyle changes and should be commended for his success. Pt feels he has achieved his goals during cardiac rehab, which include improved heart health with increased strength and endurance.  Pt has also learned safe limitations.    Pt plans to continue exercising on his own.

## 2016-04-28 NOTE — Telephone Encounter (Signed)
Reviewed with Richardson Dopp, PA and OK to proceed with cleaning today.  I spoke with Joy at Dr. Purvis Sheffield office and told them pt could proceed with cleaning and did not need antibiotics prior to cleaning.

## 2016-04-28 NOTE — Telephone Encounter (Signed)
Received call transferred from operator and spoke with Collie Siad at Dr. Purvis Sheffield office.  Pt is currently at dental office and they are asking if OK to proceed with cleaning today.  I told Collie Siad I would review chart and call her back.  Phone is [phone].  Collie Siad reports pt is feeling fine today.

## 2016-04-30 ENCOUNTER — Encounter (HOSPITAL_COMMUNITY): Admission: RE | Admit: 2016-04-30 | Payer: PPO | Source: Ambulatory Visit

## 2016-04-30 ENCOUNTER — Encounter (HOSPITAL_COMMUNITY): Payer: PPO

## 2016-05-02 ENCOUNTER — Encounter (HOSPITAL_COMMUNITY): Payer: PPO

## 2016-05-05 ENCOUNTER — Encounter (HOSPITAL_COMMUNITY): Payer: PPO

## 2016-05-07 ENCOUNTER — Encounter (HOSPITAL_COMMUNITY): Payer: PPO

## 2016-05-09 ENCOUNTER — Encounter (HOSPITAL_COMMUNITY): Payer: PPO

## 2016-05-12 ENCOUNTER — Encounter (HOSPITAL_COMMUNITY): Payer: PPO

## 2016-05-14 ENCOUNTER — Encounter (HOSPITAL_COMMUNITY): Payer: PPO

## 2016-05-16 ENCOUNTER — Encounter (HOSPITAL_COMMUNITY): Payer: PPO

## 2016-05-19 ENCOUNTER — Encounter (HOSPITAL_COMMUNITY): Payer: PPO

## 2016-05-21 ENCOUNTER — Encounter (HOSPITAL_COMMUNITY): Payer: PPO

## 2016-05-23 ENCOUNTER — Encounter (HOSPITAL_COMMUNITY): Payer: PPO

## 2016-05-26 ENCOUNTER — Encounter (HOSPITAL_COMMUNITY): Payer: PPO

## 2016-06-13 ENCOUNTER — Telehealth: Payer: Self-pay | Admitting: Physician Assistant

## 2016-06-13 NOTE — Telephone Encounter (Signed)
Doubt rash is secondary to drug reaction. Drug reactions are typically more widespread. I would recommend he try OTC hydrocortisone as mentioned in phone note. But, would also recommend he follow up with his PCP to further evaluate. Richardson Dopp, PA-C   06/13/2016 5:39 PM

## 2016-06-13 NOTE — Telephone Encounter (Signed)
I s/w pt in regards to his call about rash . Pt said he has a rash on his right shoulder blade for about 6 weeks now. Pt thinks rash may be coming from Eliquis or Plavix. Pt said he feels fine, just shoulder blade gets itchy and sometimes a little tingly feeling. I asked pt if he has ever had Shingles. Pt said no and that he got the vaccine against Shingles. Asked pt has he tried any cortisone cream. Pt answered his put some kind of cream on shoulder blade but did not know what exactly just that it helped some.   Pt denies any fevers, chills, sob, chest pain. I advised pt to keep putting the cream on and if rash gets worse or he feels worse he is to go to the ED or Urgent Care. I did also d/w Dr. Marlou Porch (DOD) who felt call was handled appropriately and great advice was given to the pt. I will also let Brynda Rim. PA know when he is back in the office on Tuesday, pt aware.

## 2016-06-13 NOTE — Telephone Encounter (Signed)
New message    Patient calling s/p stent placement about 3 month by Dr. Angelena Form.   Pt c/o medication issue:  1. Name of Medication: unsure of which medication causing / apixaban (ELIQUIS) 5 MG TABS tablet  Or clopidogrel (PLAVIX) 75 MG tablet  2. How are you currently taking this medication (dosage and times per day)? 5mg  or 75 mg   3. Are you having a reaction (difficulty breathing--STAT)? Rash  On back   4. What is your medication issue? Wants to discuss with nurse not getting any better.

## 2016-06-17 NOTE — Telephone Encounter (Signed)
DPR for wife. I was calling the pt to check on his rash. I lmtcb.

## 2016-06-19 ENCOUNTER — Emergency Department (HOSPITAL_COMMUNITY): Payer: PPO

## 2016-06-19 ENCOUNTER — Encounter (HOSPITAL_COMMUNITY): Payer: Self-pay | Admitting: Emergency Medicine

## 2016-06-19 ENCOUNTER — Emergency Department (HOSPITAL_COMMUNITY)
Admission: EM | Admit: 2016-06-19 | Discharge: 2016-06-19 | Disposition: A | Discharge status: Home / Self Care | Payer: PPO | Attending: Emergency Medicine | Admitting: Emergency Medicine

## 2016-06-19 DIAGNOSIS — I252 Old myocardial infarction: Secondary | ICD-10-CM | POA: Diagnosis not present

## 2016-06-19 DIAGNOSIS — Z79899 Other long term (current) drug therapy: Secondary | ICD-10-CM | POA: Insufficient documentation

## 2016-06-19 DIAGNOSIS — J111 Influenza due to unidentified influenza virus with other respiratory manifestations: Secondary | ICD-10-CM

## 2016-06-19 DIAGNOSIS — R69 Illness, unspecified: Secondary | ICD-10-CM

## 2016-06-19 DIAGNOSIS — Z8673 Personal history of transient ischemic attack (TIA), and cerebral infarction without residual deficits: Secondary | ICD-10-CM | POA: Insufficient documentation

## 2016-06-19 DIAGNOSIS — Z8546 Personal history of malignant neoplasm of prostate: Secondary | ICD-10-CM | POA: Insufficient documentation

## 2016-06-19 DIAGNOSIS — R531 Weakness: Secondary | ICD-10-CM | POA: Diagnosis not present

## 2016-06-19 DIAGNOSIS — I2510 Atherosclerotic heart disease of native coronary artery without angina pectoris: Secondary | ICD-10-CM | POA: Diagnosis not present

## 2016-06-19 DIAGNOSIS — R05 Cough: Secondary | ICD-10-CM | POA: Diagnosis not present

## 2016-06-19 LAB — CBC WITH DIFFERENTIAL/PLATELET
Basophils Absolute: 0 10*3/uL (ref 0.0–0.1)
Basophils Relative: 0 %
EOS ABS: 0 10*3/uL (ref 0.0–0.7)
EOS PCT: 0 %
HCT: 48 % (ref 39.0–52.0)
HEMOGLOBIN: 15.8 g/dL (ref 13.0–17.0)
LYMPHS ABS: 1.1 10*3/uL (ref 0.7–4.0)
Lymphocytes Relative: 16 %
MCH: 31.2 pg (ref 26.0–34.0)
MCHC: 32.9 g/dL (ref 30.0–36.0)
MCV: 94.7 fL (ref 78.0–100.0)
MONO ABS: 0.5 10*3/uL (ref 0.1–1.0)
MONOS PCT: 8 %
Neutro Abs: 5.2 10*3/uL (ref 1.7–7.7)
Neutrophils Relative %: 76 %
PLATELETS: 162 10*3/uL (ref 150–400)
RBC: 5.07 MIL/uL (ref 4.22–5.81)
RDW: 14 % (ref 11.5–15.5)
WBC: 6.8 10*3/uL (ref 4.0–10.5)

## 2016-06-19 LAB — COMPREHENSIVE METABOLIC PANEL
ALK PHOS: 59 U/L (ref 38–126)
ALT: 28 U/L (ref 17–63)
ANION GAP: 7 (ref 5–15)
AST: 36 U/L (ref 15–41)
Albumin: 3.4 g/dL — ABNORMAL LOW (ref 3.5–5.0)
BUN: 14 mg/dL (ref 6–20)
CALCIUM: 9 mg/dL (ref 8.9–10.3)
CO2: 27 mmol/L (ref 22–32)
Chloride: 101 mmol/L (ref 101–111)
Creatinine, Ser: 1.18 mg/dL (ref 0.61–1.24)
GFR calc non Af Amer: 54 mL/min — ABNORMAL LOW (ref >60)
Glucose, Bld: 107 mg/dL — ABNORMAL HIGH (ref 65–99)
Potassium: 4.3 mmol/L (ref 3.5–5.1)
SODIUM: 135 mmol/L (ref 135–145)
TOTAL PROTEIN: 6 g/dL — AB (ref 6.5–8.1)
Total Bilirubin: 0.7 mg/dL (ref 0.3–1.2)

## 2016-06-19 LAB — I-STAT CG4 LACTIC ACID, ED: Lactic Acid, Venous: 1.29 mmol/L (ref 0.5–1.9)

## 2016-06-19 MED ORDER — OSELTAMIVIR PHOSPHATE 75 MG PO CAPS: 75.0000 mg | ORAL_CAPSULE | Freq: Two times a day (BID) | ORAL | 0 refills | Status: DC

## 2016-06-19 MED ORDER — FLUTICASONE PROPIONATE 50 MCG/ACT NA SUSP: 2.0000 | Freq: Every day | NASAL | 0 refills | Status: DC

## 2016-06-19 MED ORDER — BENZONATATE 100 MG PO CAPS: 100.0000 mg | ORAL_CAPSULE | Freq: Three times a day (TID) | ORAL | 0 refills | Status: DC | PRN

## 2016-06-19 MED ORDER — ACETAMINOPHEN 325 MG PO TABS
ORAL_TABLET | ORAL | Status: AC
  Administered 2016-06-19: 650 mg via ORAL
  Filled 2016-06-19: qty 2

## 2016-06-19 MED ORDER — ACETAMINOPHEN 325 MG PO TABS
650.0000 mg | ORAL_TABLET | Freq: Once | ORAL | Status: AC | PRN
  Administered 2016-06-19: 650 mg via ORAL

## 2016-06-19 MED ORDER — ACETAMINOPHEN 325 MG PO TABS: 650.0000 mg | ORAL_TABLET | Freq: Four times a day (QID) | ORAL | 0 refills | Status: AC | PRN

## 2016-06-19 NOTE — ED Provider Notes (Signed)
[facility] DEPT Provider Note   CSN: [id number] Arrival date & time: 06/19/16  1502     History   Chief Complaint Chief Complaint  Patient presents with  . URI  . Weakness    HPI Henry Franklin. is a 83 y.o. male.  Henry Franklin. Is a 83 y.o. Male who presents to the emergency department with his wife who reports the patient had flulike illness for the past 3 days. He reports this morning he woke up and was generally weak all over. He reports having trouble walking and felt like his strength was "zapped." He reports associated symptoms of cough, nasal congestion, postnasal drip, sneezing and runny nose. He reports chills and body aches. No treatments prior to arrival today. On arrival triage nurse provided the patient with Tylenol. Patient reports while waiting in the waiting room he's begun to feel much better. He is now ambulating on his own and feels much better. Patient denies chest pain, shortness of breath, sore throat, trouble swallowing, neck pain, abdominal pain, nausea, vomiting, diarrhea, focal weakness or urinary symptoms.   The history is provided by the patient and the spouse. No language interpreter was used.  URI   Associated symptoms include congestion, rhinorrhea, sneezing and cough. Pertinent negatives include no chest pain, no abdominal pain, no diarrhea, no nausea, no vomiting, no dysuria, no headaches, no sore throat, no neck pain, no rash and no wheezing.  Weakness  Pertinent negatives include no shortness of breath, no chest pain, no vomiting and no headaches.    Past Medical History:  Diagnosis Date  . CAD (coronary artery disease)    a. STEMI 6/17: LHC - oLAD 10, mLAD 50, dLAD 20, D2 inf subbranch 70 + sup subbranch 100, pRCA 20, oRPDA 40, EF 35-45% >> PCI:  2.25 x 12 mm resolute integrity DES to the superior subbranch of D2   . Essential and other specified forms of tremor   . External hemorrhoids without mention of complication   . History of ST  elevation myocardial infarction (STEMI)    a. Ant-Lat STEMI >> DES to D2 subbranch  . History of stroke   . History of TIA (transient ischemic attack)   . HTN (hypertension)   . Hyperlipidemia   . Ischemic cardiomyopathy    a. Echo 6/17: Mid and distal anterior/inferior, septal and apical HK, mild concentric LVH, EF 30-35%, mild LAE  //  b. Echo 9/17 mild focal basal septal hypertrophy, EF 55-60%, normal wall motion, grade 1 diastolic dysfunction, mildly dilated aortic root (37 mm), normal RVSF, PASP 29 mmHg  . Lumbago   . Malignant neoplasm of prostate ([place])   . Nontraumatic rupture of tendons of biceps (long head)   . Paroxysmal atrial fibrillation (HCC)    during admit for STEMI 6/17 >> triple Rx high risk >> ASA + Plavix x 30 days post MI, then Eliquis + Plavix  . Sebaceous cyst   . Supraventricular premature beats   . Unspecified anomaly of tooth position     Patient Active Problem List   Diagnosis Date Noted  . Impacted cerumen of left ear 03/05/2016  . CAD (coronary artery disease) 12/03/2015  . Atrial fibrillation ([place]) 11/21/2015  . STEMI (ST elevation myocardial infarction) ([place]) 11/19/2015  . History of CVA (cerebrovascular accident) 05/31/2014  . Pulmonary embolism ([place]) 05/20/2014  . HTN (hypertension) 05/20/2014  . Prostate cancer ([place]) 04/05/2014  . Spinal stenosis of lumbar region 11/15/2013  . Falls 05/10/2013  .  Hyperlipidemia   . Benign essential tremor   . Lumbago   . Palpitations   . Ingrown toenail without infection 09/07/2012    Past Surgical History:  Procedure Laterality Date  . APPENDECTOMY  1942  . BACK SURGERY     with sciatica  . CARDIAC CATHETERIZATION N/A 11/19/2015   Procedure: Left Heart Cath and Coronary Angiography;  Surgeon: Burnell Blanks, MD;  Location: [facility];  Service: Cardiovascular;  Laterality: N/A;  . CARDIAC CATHETERIZATION N/A 11/19/2015   Procedure: Coronary Stent Intervention;  Surgeon: Burnell Blanks, MD;  Location: [facility];  Service: Cardiovascular;  Laterality: N/A;  . prostate cancer    . [place] Medications    Prior to Admission medications   Medication Sig Start Date End Date Taking? Authorizing Provider  acetaminophen (TYLENOL) 325 MG tablet Take 2 tablets (650 mg total) by mouth every 6 (six) hours as needed for mild pain, moderate pain or fever. 06/19/16   Waynetta Pean, PA-C  apixaban (ELIQUIS) 5 MG TABS tablet Take 1 tablet (5 mg total) by mouth 2 (two) times daily. 12/20/15   Liliane Shi, PA-C  atorvastatin (LIPITOR) 80 MG tablet Take 1 tablet (80 mg total) by mouth daily at 6 PM. 11/22/15   Cheryln Manly, NP  benzonatate (TESSALON) 100 MG capsule Take 1 capsule (100 mg total) by mouth 3 (three) times daily as needed for cough. 06/19/16   Waynetta Pean, PA-C  Cholecalciferol (VITAMIN D-3) 1000 UNITS CAPS Take 1 capsule by mouth daily. Take one tablet by mouth once daily.    Historical Provider, MD  clopidogrel (PLAVIX) 75 MG tablet Take 1 tablet (75 mg total) by mouth daily with breakfast. 11/22/15   Cheryln Manly, NP  fluticasone (FLONASE) 50 MCG/ACT nasal spray Place 2 sprays into both nostrils daily. 06/19/16   Waynetta Pean, PA-C  lisinopril (PRINIVIL,ZESTRIL) 2.5 MG tablet Take 1 tablet (2.5 mg total) by mouth daily. 11/22/15   Cheryln Manly, NP  Multiple Vitamin (MULTIVITAMIN) tablet Take 1 tablet by mouth daily.    Historical Provider, MD  nitroGLYCERIN (NITROSTAT) 0.4 MG SL tablet Place 1 tablet (0.4 mg total) under the tongue every 5 (five) minutes x 3 doses as needed for chest pain. 11/22/15   Cheryln Manly, NP  oseltamivir (TAMIFLU) 75 MG capsule Take 1 capsule (75 mg total) by mouth every 12 (twelve) hours. 06/19/16   Waynetta Pean, PA-C    Family History Family History  Problem Relation Age of Onset  . Cancer Father 70    brain tumor    Social History Social History  Substance Use Topics   . Smoking status: Never Smoker  . Smokeless tobacco: Never Used  . Alcohol use No     Allergies   Patient has no known allergies.   Review of Systems Review of Systems  Constitutional: Positive for chills, fatigue and fever.  HENT: Positive for congestion, postnasal drip, rhinorrhea and sneezing. Negative for sore throat and trouble swallowing.   Eyes: Negative for visual disturbance.  Respiratory: Positive for cough. Negative for shortness of breath and wheezing.   Cardiovascular: Negative for chest pain and palpitations.  Gastrointestinal: Negative for abdominal pain, diarrhea, nausea and vomiting.  Genitourinary: Negative for dysuria, frequency and urgency.  Musculoskeletal: Positive for myalgias. Negative for back pain and neck pain.  Skin: Negative for rash.  Neurological: Positive for weakness (generalized weakness).  Negative for headaches.     Physical Exam Updated Vital Signs BP 133/78   Pulse 72   Temp 99.3 F (37.4 C) (Oral)   Resp 20   SpO2 98%   Physical Exam  Constitutional: He is oriented to person, place, and time. He appears well-developed and well-nourished. No distress.  Nontoxic appearing.  HENT:  Head: Normocephalic and atraumatic.  Mouth/Throat: Oropharynx is clear and moist.  Bilateral tympanic membranes are pearly-gray without erythema or loss of landmarks.  Boggy nasal turbinates bilaterally. No tonsillar hypertrophy or exudates. Evidence of postnasal drip.  Eyes: Conjunctivae are normal. Pupils are equal, round, and reactive to light. Right eye exhibits no discharge. Left eye exhibits no discharge.  Neck: Normal range of motion. Neck supple. No JVD present. No tracheal deviation present.  Cardiovascular: Normal rate, regular rhythm, normal heart sounds and intact distal pulses.   Pulmonary/Chest: Effort normal and breath sounds normal. No stridor. No respiratory distress. He has no wheezes. He has no rales. He exhibits no tenderness.  Lungs  clear to auscultation bilaterally. No increased worker breathing. No rales or rhonchi.  Abdominal: Soft. There is no tenderness.  Musculoskeletal: He exhibits no edema or tenderness.  No lower extremity edema or tenderness. Good strength in his bilateral upper and lower extremities. Normal gait.  Lymphadenopathy:    He has no cervical adenopathy.  Neurological: He is alert and oriented to person, place, and time. No sensory deficit. He exhibits normal muscle tone. Coordination normal.  Patient is able to ambulate in the room with normal gait. Good strength to his bilateral upper and lower extremities.  Skin: Skin is warm and dry. Capillary refill takes less than 2 seconds. No rash noted. He is not diaphoretic. No erythema. No pallor.  Psychiatric: He has a normal mood and affect. His behavior is normal.  Nursing note and vitals reviewed.    ED Treatments / Results  Labs (all labs ordered are listed, but only abnormal results are displayed) Labs Reviewed  COMPREHENSIVE METABOLIC PANEL - Abnormal; Notable for the following:       Result Value   Glucose, Bld 107 (*)    Total Protein 6.0 (*)    Albumin 3.4 (*)    GFR calc non Af Amer 54 (*)    All other components within normal limits  CBC WITH DIFFERENTIAL/PLATELET  I-STAT CG4 LACTIC ACID, ED    EKG  EKG Interpretation None       Radiology Dg Chest 2 View  Result Date: 06/19/2016 CLINICAL DATA:  Fever, cough and congestion. EXAM: CHEST  2 VIEW COMPARISON:  Chest x-ray dated 09/16/2003. FINDINGS: Heart size and mediastinal contours are within normal limits. Atherosclerotic changes noted at the aortic arch. Lungs are clear. No pleural effusion or pneumothorax seen. Osseous and soft tissue structures about the chest are unremarkable. IMPRESSION: No active cardiopulmonary disease.  No evidence of pneumonia. Aortic atherosclerosis. Electronically Signed   By: Franki Cabot M.D.   On: 06/19/2016 16:27    Procedures Procedures  (including critical care time)  Medications Ordered in ED Medications  acetaminophen (TYLENOL) tablet 650 mg (650 mg Oral Given 06/19/16 1534)     Initial Impression / Assessment and Plan / ED Course  I have reviewed the triage vital signs and the nursing notes.  Pertinent labs & imaging results that were available during my care of the patient were reviewed by me and considered in my medical decision making (see chart for details).    This Is a  83 y.o. Male who presents to the emergency department with his wife who reports the patient had flulike illness for the past 3 days. He reports this morning he woke up and was generally weak all over. He reports having trouble walking and felt like his strength was "zapped." He reports associated symptoms of cough, nasal congestion, postnasal drip, sneezing and runny nose. He reports chills and body aches. No treatments prior to arrival today. On arrival triage nurse provided the patient with Tylenol. Patient reports while waiting in the waiting room he's begun to feel much better. He is now ambulating on his own and feels much better. Patient denies chest pain, or shortness of breath. Arrival to the emergency department the patient has a temperature of 102.8. On my exam patient is afebrile and nontoxic appearing. Temperature is down to 99.3. Lungs clear to auscultation bilaterally. He is tolerating by mouth without difficulty. He appears to be well-hydrated. His abdomen is soft and nontender to palpation. Patient is able to ambulate in the room without difficulty. He reports feeling much better after receiving Tylenol. Chest x-ray shows no evidence of pneumonia. Lactic acid is within normal limits. CBC is within normal limits. CMP is around the patient's baseline. Patient with influenza-like illness. I discussed the use of Tamiflu and its side effects. Patient elected to go ahead and start Tamiflu. Will also treat his fevers with Tylenol and prescriptions for  Flonase and Tessalon Perles. I encouraged to push oral fluids. I discussed strict and specific return precautions. I advised the patient to follow-up with their primary care provider this week. I advised the patient to return to the emergency department with new or worsening symptoms or new concerns. The patient and his wife verbalized understanding and agreement with plan.     This patient was discussed with and evaluated by Dr. Gilford Raid who agrees with assessment and plan.   Final Clinical Impressions(s) / ED Diagnoses   Final diagnoses:  Influenza-like illness    New Prescriptions New Prescriptions   ACETAMINOPHEN (TYLENOL) 325 MG TABLET    Take 2 tablets (650 mg total) by mouth every 6 (six) hours as needed for mild pain, moderate pain or fever.   BENZONATATE (TESSALON) 100 MG CAPSULE    Take 1 capsule (100 mg total) by mouth 3 (three) times daily as needed for cough.   FLUTICASONE (FLONASE) 50 MCG/ACT NASAL SPRAY    Place 2 sprays into both nostrils daily.   OSELTAMIVIR (TAMIFLU) 75 MG CAPSULE    Take 1 capsule (75 mg total) by mouth every 12 (twelve) hours.     Waynetta Pean, PA-C 06/19/16 2009    Isla Pence, MD 06/19/16 2116

## 2016-06-19 NOTE — ED Triage Notes (Signed)
Pt here with wife for congestion and cough with generalized weakness x 3 days; pt unable to stand

## 2016-06-21 ENCOUNTER — Telehealth: Payer: Self-pay | Admitting: Cardiology

## 2016-06-21 NOTE — Telephone Encounter (Signed)
Wife called asking about medications he was given in the ED for flu. Reports he was given Tamiflu and wanted to check this was ok to take with other cardiac medications. I instructed this was fine and to continue with his home medications as instructed by primary cardiologist. Wife voiced understanding and thanked me for the call back.    Reino Bellis NP-c

## 2016-08-27 DIAGNOSIS — X32XXXD Exposure to sunlight, subsequent encounter: Secondary | ICD-10-CM | POA: Diagnosis not present

## 2016-08-27 DIAGNOSIS — D225 Melanocytic nevi of trunk: Secondary | ICD-10-CM | POA: Diagnosis not present

## 2016-08-27 DIAGNOSIS — L570 Actinic keratosis: Secondary | ICD-10-CM | POA: Diagnosis not present

## 2016-09-09 ENCOUNTER — Telehealth: Payer: Self-pay | Admitting: Internal Medicine

## 2016-09-09 NOTE — Telephone Encounter (Signed)
1st call to mobile # was wrong number.  I called the home # and left a message asking pt to confirm if Dr. Quay Burow is PCP. Pt saw Dr. Nyoka Cowden in Feb. 2017, but Dr. Quay Burow is listed as PCP. Henry Franklin ([place])

## 2016-11-03 ENCOUNTER — Telehealth: Payer: Self-pay | Admitting: Cardiovascular Disease

## 2016-11-03 NOTE — Telephone Encounter (Signed)
Reviewed with Dr. Angelena Form who recommends pt go to ED at [facility] for evaluation.  I spoke with pt and gave him this information.  I told him if he is having discomfort he should call EMS.

## 2016-11-03 NOTE — Telephone Encounter (Signed)
I spoke with pt who reports chest discomfort for the last 7-10 days.  Is off and on.  Worse when he swallows. He is note very active but states discomfort is the same at rest and when he is up moving around.  Did go for a walk over the weekend when it was hot and had this discomfort. Discomfort eases off on it's own.  Has NTG but has not used. Each episode lasts less than a minute.  This is new for him and does not feel like previous heart pain.  Pain is in a certain spot in his upper chest.  He is not having discomfort at this time. Will review with Dr. Angelena Form

## 2016-11-03 NOTE — Telephone Encounter (Signed)
New message     Pt c/o of Chest Pain: STAT if CP now or developed within 24 hours  1. Are you having CP right now? No A discomfort that comes and goes   2. Are you experiencing any other symptoms (ex. SOB, nausea, vomiting, sweating)? no  3. How long have you been experiencing CP? 7-10 day  4. Is your CP continuous or coming and going? Comes and goes   5. Have you taken Nitroglycerin?  no ?

## 2016-11-03 NOTE — Telephone Encounter (Signed)
Agree that he should go to the ED for evaluation. cdm

## 2016-11-05 ENCOUNTER — Telehealth: Payer: Self-pay | Admitting: Internal Medicine

## 2016-11-05 NOTE — Telephone Encounter (Signed)
Just FYI patient will be receiving a back brace from [facility] for his back pain

## 2016-11-05 NOTE — Telephone Encounter (Signed)
noted 

## 2016-11-08 ENCOUNTER — Other Ambulatory Visit: Payer: Self-pay | Admitting: Cardiology

## 2016-11-10 IMAGING — CT CT ANGIO CHEST
1 of 9 series · 14 of 36 positions shown · IV contrast (Iohexol (Omnipaque 350))
Comparison: None.

CLINICAL DATA: Right-sided chest pain with shortness of breath that
began yesterday. Difficulty taking deep breath. History of prostate
cancer.

EXAM:
CT ANGIOGRAPHY CHEST WITH CONTRAST
TECHNIQUE: Multidetector CT imaging of the chest was performed using the
standard protocol during bolus administration of intravenous
contrast. Multiplanar CT image reconstructions and MIPs were
obtained to evaluate the vascular anatomy.
CONTRAST:  75mL OMNIPAQUE IOHEXOL 350 MG/ML SOLN

[Series 407: thins pacs · axial · 0.72mm/px · z∈[+107,+371]mm · 14 of 306 slices shown]
[im 21/306  lung]
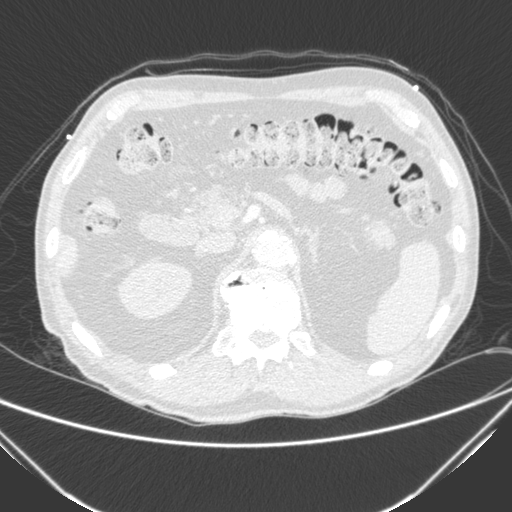
[im 41/306  mediastinal]
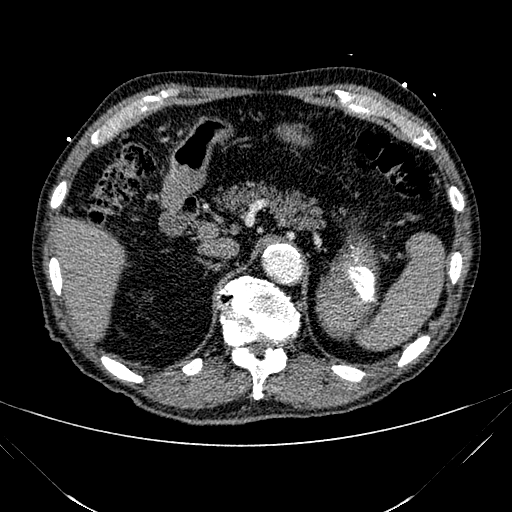
[im 62/306  lung]
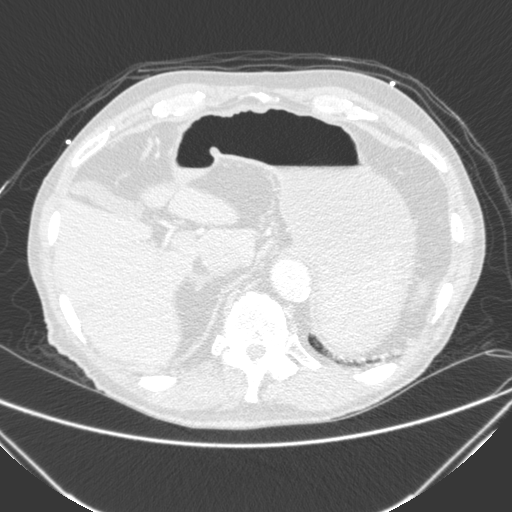
[im 82/306  mediastinal]
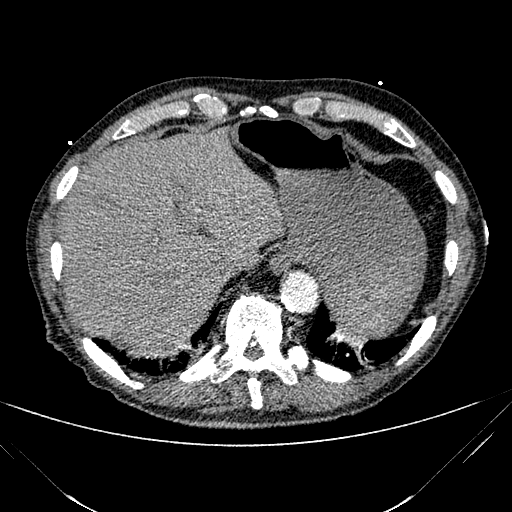
[im 102/306  lung]
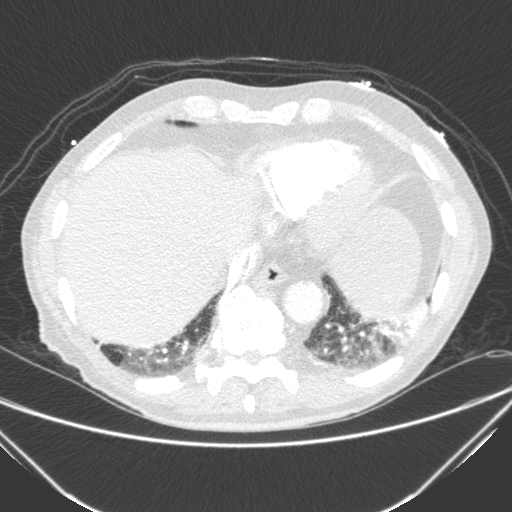
[im 123/306  mediastinal]
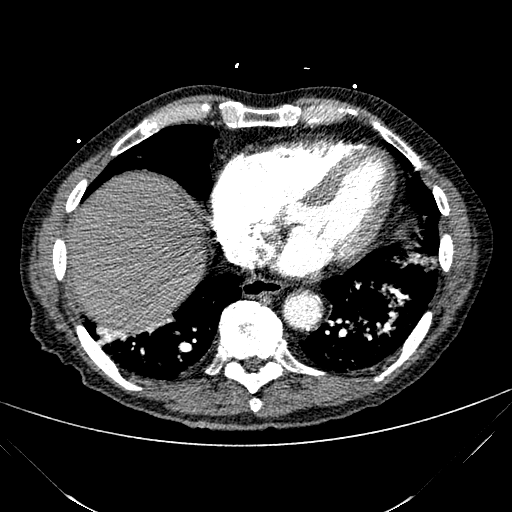
[im 143/306  lung]
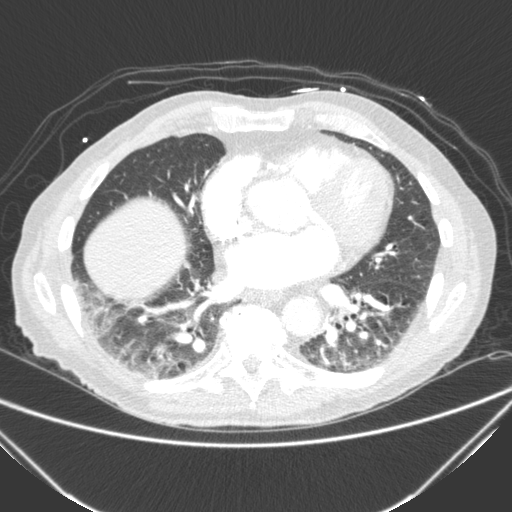
[im 163/306  mediastinal]
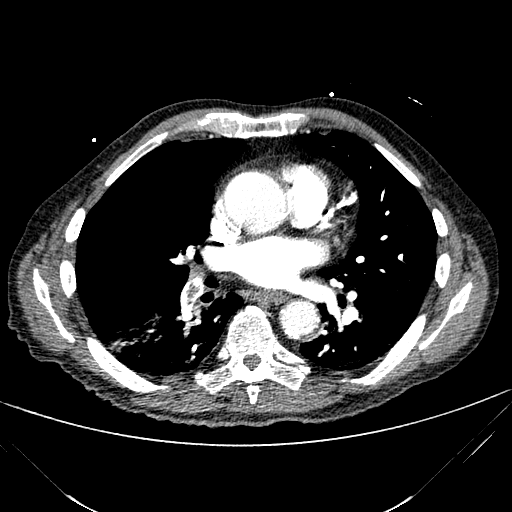
[im 184/306  lung]
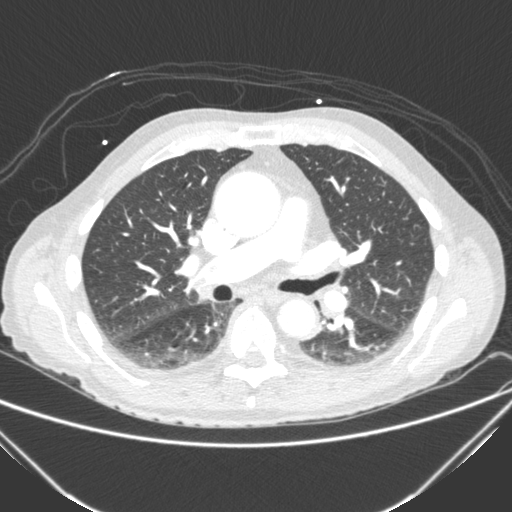
[im 204/306  mediastinal]
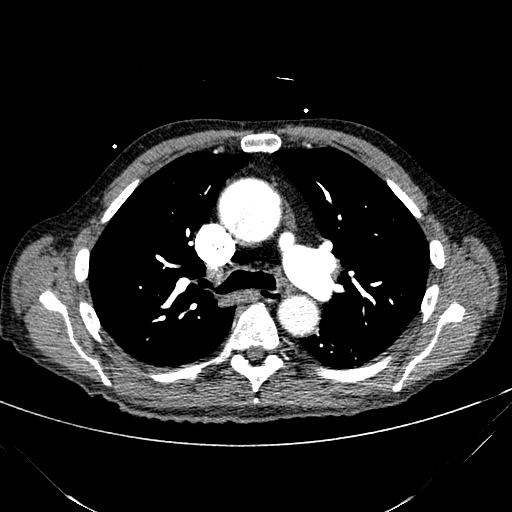
[im 224/306  lung]
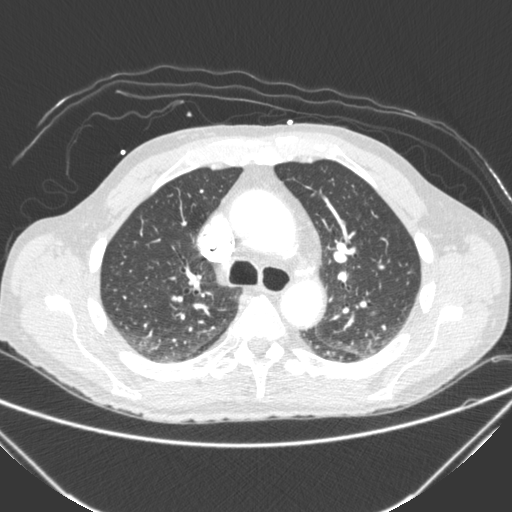
[im 245/306  mediastinal]
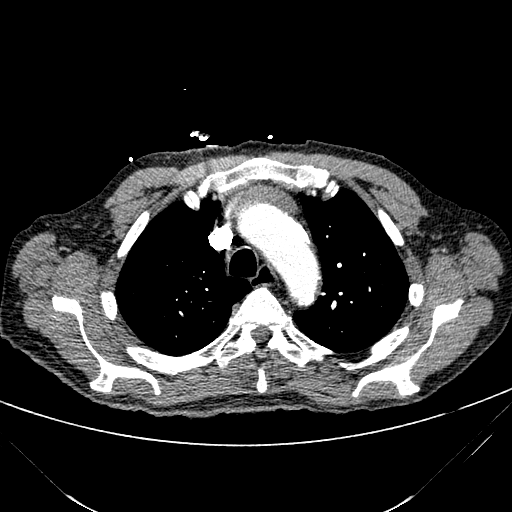
[im 265/306  lung]
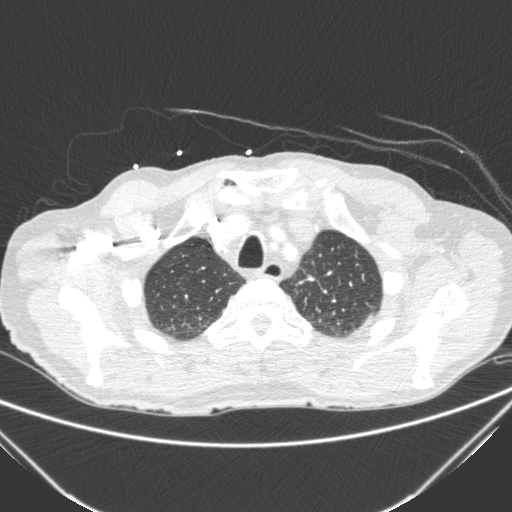
[im 285/306  mediastinal]
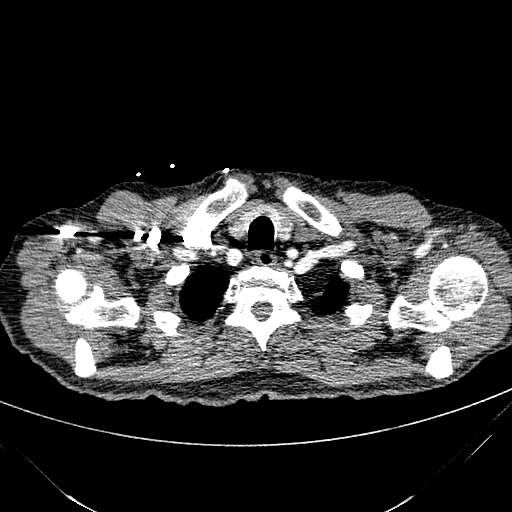

[14 of 36 positions shown; findings below may reference images not displayed]

FINDINGS: Mediastinum: Multiple filling defects are noted within the RIGHT
pulmonary arterial tree, including the RIGHT main pulmonary artery,
RIGHT upper lobe vessels, and predominantly RIGHT lower lobe vessels
consistent with acute pulmonary emboli. No similar left-sided
emboli.

Findings suggesting RIGHT heart strain with abnormally dilated RIGHT
ventricle. RV/LV ratio of 1.39 based on widest inner wall
measurements of 32 mm RV and 23 mm LV.

No pericardial fluid, thickening or calcification. No acute
abnormality of the thoracic aorta or other great vessels of the
mediastinum. No pathologically enlarged mediastinal or hilar lymph
nodes. The esophagus is normal in appearance.

Lungs/Pleura: Patchy BILATERAL subsegmental atelectasis,
ground-glass opacities, and peripheral consolidation. Particularly
on the LEFT, a small wedge-shaped configuration could represent a
pulmonary infarct, although no definite LEFT lower lobe emboli are
seen. No pleural effusions. No suspicious appearing pulmonary
nodules or masses.

Upper Abdomen: Visualized portions of the upper abdomen are
unremarkable.

Musculoskeletal: No aggressive appearing lytic or blastic lesions
are noted in the visualized portions of the skeleton.

Review of the MIP images confirms the above findings.
IMPRESSION: Moderate clot burden in the RIGHT pulmonary artery and its major
branches consistent with acute pulmonary emboli. No similar emboli
on the LEFT.

CT evidence of right heart strain (RV/LV Ratio = 1.39) consistent
with at least submassive (intermediate risk)PE. The presence of
right heart strain has been associated with an increased risk of
morbidity and mortality. Consultation with Pulmonary and [REDACTED] is recommended.

Nonspecific areas of lower lobe bibasilar subsegmental atelectasis,
ground-glass opacities, and peripheral consolidation. See discussion
above.

Critical Value/emergent results were called by telephone at the time
of interpretation on 05/20/2014 at [DATE] to Dr. KIT KEUNG MATSU ,
who verbally acknowledged these results.

## 2016-12-22 ENCOUNTER — Other Ambulatory Visit: Payer: Self-pay

## 2016-12-22 NOTE — Patient Outreach (Signed)
[facility]) Care Management  12/22/2016  Henry Franklin 1927/05/20 [id number]  Nurse call line Nurse call line date; 12/22/16 REFERRAL REASON; neck stiffness/ pain Nurse call line recommendation: See primary MD within 3 dyas [facility] care management referral date: 12/22/16  Telephone call to patient regarding nurse call line referral. Unable to reach patient. HIPAA compliant voice message left with call back phone number.   PLAN:RNCM will attempt #2 call within 3 business days.   Quinn Plowman RN,BSN,CCM [facility] Telephonic  [phone]

## 2016-12-23 ENCOUNTER — Other Ambulatory Visit: Payer: Self-pay

## 2016-12-23 ENCOUNTER — Encounter: Payer: Self-pay | Admitting: Family

## 2016-12-23 ENCOUNTER — Ambulatory Visit (INDEPENDENT_AMBULATORY_CARE_PROVIDER_SITE_OTHER): Payer: PPO | Admitting: Family

## 2016-12-23 VITALS — BP 128/82 | HR 53 | Temp 98.7°F | Resp 16 | Ht 70.0 in | Wt 139.0 lb

## 2016-12-23 DIAGNOSIS — M62838 Other muscle spasm: Secondary | ICD-10-CM | POA: Insufficient documentation

## 2016-12-23 NOTE — Patient Instructions (Signed)
Thank you for choosing [facility].  SUMMARY AND INSTRUCTIONS:  It appears you have muscle spasm in your neck.  Ice / Moist heat x 20 minutes every other hour as needed.  Stretches multiple times throughout the day.  Tylenol 500-650 mg up to 3x per day as needed.  Over the counter creams such as: [place], [place], [place], or [place].  Follow up if your symptoms worsen or do not improve.   Follow up:  If your symptoms worsen or fail to improve, please contact our office for further instruction, or in case of emergency go directly to the emergency room at the closest medical facility.    Cervical Strain and Sprain Rehab Ask your health care provider which exercises are safe for you. Do exercises exactly as told by your health care provider and adjust them as directed. It is normal to feel mild stretching, pulling, tightness, or discomfort as you do these exercises, but you should stop right away if you feel sudden pain or your pain gets worse.Do not begin these exercises until told by your health care provider. Stretching and range of motion exercises These exercises warm up your muscles and joints and improve the movement and flexibility of your neck. These exercises also help to relieve pain, numbness, and tingling. Exercise A: Cervical side bend  1. Using good posture, sit on a stable chair or stand up. 2. Without moving your shoulders, slowly tilt your left / right ear to your shoulder until you feel a stretch in your neck muscles. You should be looking straight ahead. 3. Hold for __________ seconds. 4. Repeat with the other side of your neck. Repeat __________ times. Complete this exercise __________ times a day. Exercise B: Cervical rotation  1. Using good posture, sit on a stable chair or stand up. 2. Slowly turn your head to the side as if you are looking over your left / right shoulder. ? Keep your eyes level with the ground. ? Stop when you feel a stretch  along the side and the back of your neck. 3. Hold for __________ seconds. 4. Repeat this by turning to your other side. Repeat __________ times. Complete this exercise __________ times a day. Exercise C: Thoracic extension and pectoral stretch 1. Roll a towel or a small blanket so it is about 4 inches (10 cm) in diameter. 2. Lie down on your back on a firm surface. 3. Put the towel lengthwise, under your spine in the middle of your back. It should not be not under your shoulder blades. The towel should line up with your spine from your middle back to your lower back. 4. Put your hands behind your head and let your elbows fall out to your sides. 5. Hold for __________ seconds. Repeat __________ times. Complete this exercise __________ times a day. Strengthening exercises These exercises build strength and endurance in your neck. Endurance is the ability to use your muscles for a long time, even after your muscles get tired. Exercise D: Upper cervical flexion, isometric 1. Lie on your back with a thin pillow behind your head and a small rolled-up towel under your neck. 2. Gently tuck your chin toward your chest and nod your head down to look toward your feet. Do not lift your head off the pillow. 3. Hold for __________ seconds. 4. Release the tension slowly. Relax your neck muscles completely before you repeat this exercise. Repeat __________ times. Complete this exercise __________ times a day. Exercise E: Cervical extension, isometric  1. Stand about 6 inches (15 cm) away from a wall, with your back facing the wall. 2. Place a soft object, about 6-8 inches (15-20 cm) in diameter, between the back of your head and the wall. A soft object could be a small pillow, a ball, or a folded towel. 3. Gently tilt your head back and press into the soft object. Keep your jaw and forehead relaxed. 4. Hold for __________ seconds. 5. Release the tension slowly. Relax your neck muscles completely before you  repeat this exercise. Repeat __________ times. Complete this exercise __________ times a day. Posture and body mechanics  Body mechanics refers to the movements and positions of your body while you do your daily activities. Posture is part of body mechanics. Good posture and healthy body mechanics can help to relieve stress in your body's tissues and joints. Good posture means that your spine is in its natural S-curve position (your spine is neutral), your shoulders are pulled back slightly, and your head is not tipped forward. The following are general guidelines for applying improved posture and body mechanics to your everyday activities. Standing  When standing, keep your spine neutral and keep your feet about hip-width apart. Keep a slight bend in your knees. Your ears, shoulders, and hips should line up.  When you do a task in which you stand in one place for a long time, place one foot up on a stable object that is 2-4 inches (5-10 cm) high, such as a footstool. This helps keep your spine neutral. Sitting   When sitting, keep your spine neutral and your keep feet flat on the floor. Use a footrest, if necessary, and keep your thighs parallel to the floor. Avoid rounding your shoulders, and avoid tilting your head forward.  When working at a desk or a computer, keep your desk at a height where your hands are slightly lower than your elbows. Slide your chair under your desk so you are close enough to maintain good posture.  When working at a computer, place your monitor at a height where you are looking straight ahead and you do not have to tilt your head forward or downward to look at the screen. Resting When lying down and resting, avoid positions that are most painful for you. Try to support your neck in a neutral position. You can use a contour pillow or a small rolled-up towel. Your pillow should support your neck but not push on it. This information is not intended to replace advice  given to you by your health care provider. Make sure you discuss any questions you have with your health care provider. Document Released: 05/19/2005 Document Revised: 01/24/2016 Document Reviewed: 04/25/2015 Elsevier Interactive Patient Education  [employer].

## 2016-12-23 NOTE — Progress Notes (Signed)
Subjective:    Patient ID: Henry Franklin., male    DOB: 04-08-1927, 83 y.o.   MRN: [id number]  Chief Complaint  Patient presents with  . Neck Pain    states he has been having sharp pains in his neck, x1 month     HPI:  Ruhaan Franklin. is a 83 y.o. male who  has a past medical history of CAD (coronary artery disease); Essential and other specified forms of tremor; External hemorrhoids without mention of complication; History of ST elevation myocardial infarction (STEMI); History of stroke; History of TIA (transient ischemic attack); HTN (hypertension); Hyperlipidemia; Ischemic cardiomyopathy; Lumbago; Malignant neoplasm of prostate ([place]); Nontraumatic rupture of tendons of biceps (long head); Paroxysmal atrial fibrillation ([place]); Sebaceous cyst; Supraventricular premature beats; and Unspecified anomaly of tooth position. and presents today for an acute office visit.   This is a new problem. Associated symptom of pain located in his neck has been going on for about 2 weeks. Describes stiffness and occasional sharp pain depending upon the way that he turns his neck. Modifying factors including stopping exercising and trying to keep his head steady. No numbness or tingling in the upper extremities.   No Known Allergies    Outpatient Medications Prior to Visit  Medication Sig Dispense Refill  . acetaminophen (TYLENOL) 325 MG tablet Take 2 tablets (650 mg total) by mouth every 6 (six) hours as needed for mild pain, moderate pain or fever. 60 tablet 0  . apixaban (ELIQUIS) 5 MG TABS tablet Take 1 tablet (5 mg total) by mouth 2 (two) times daily. 180 tablet 3  . atorvastatin (LIPITOR) 80 MG tablet TAKE 1 TABLET (80 MG TOTAL) BY MOUTH DAILY AT 6 PM. 30 tablet 4  . Cholecalciferol (VITAMIN D-3) 1000 UNITS CAPS Take 1 capsule by mouth daily. Take one tablet by mouth once daily.    . clopidogrel (PLAVIX) 75 MG tablet TAKE 1 TABLET (75 MG TOTAL) BY MOUTH DAILY WITH BREAKFAST. 30 tablet 4  .  lisinopril (PRINIVIL,ZESTRIL) 2.5 MG tablet TAKE 1 TABLET (2.5 MG TOTAL) BY MOUTH DAILY. 30 tablet 4  . Multiple Vitamin (MULTIVITAMIN) tablet Take 1 tablet by mouth daily.    . nitroGLYCERIN (NITROSTAT) 0.4 MG SL tablet Place 1 tablet (0.4 mg total) under the tongue every 5 (five) minutes x 3 doses as needed for chest pain. 25 tablet 3  . benzonatate (TESSALON) 100 MG capsule Take 1 capsule (100 mg total) by mouth 3 (three) times daily as needed for cough. 21 capsule 0  . fluticasone (FLONASE) 50 MCG/ACT nasal spray Place 2 sprays into both nostrils daily. 16 g 0  . oseltamivir (TAMIFLU) 75 MG capsule Take 1 capsule (75 mg total) by mouth every 12 (twelve) hours. 10 capsule 0   No facility-administered medications prior to visit.       Past Surgical History:  Procedure Laterality Date  . APPENDECTOMY  1942  . BACK SURGERY     with sciatica  . CARDIAC CATHETERIZATION N/A 11/19/2015   Procedure: Left Heart Cath and Coronary Angiography;  Surgeon: Burnell Blanks, MD;  Location: [place] [facility];  Service: Cardiovascular;  Laterality: N/A;  . CARDIAC CATHETERIZATION N/A 11/19/2015   Procedure: Coronary Stent Intervention;  Surgeon: Burnell Blanks, MD;  Location: [facility] [facility];  Service: Cardiovascular;  Laterality: N/A;  . prostate cancer    . SPINE SURGERY N/A 2000   [name]      Past Medical History:  Diagnosis Date  .  CAD (coronary artery disease)    a. STEMI 6/17: LHC - oLAD 10, mLAD 50, dLAD 20, D2 inf subbranch 70 + sup subbranch 100, pRCA 20, oRPDA 40, EF 35-45% >> PCI:  2.25 x 12 mm resolute integrity DES to the superior subbranch of D2   . Essential and other specified forms of tremor   . External hemorrhoids without mention of complication   . History of ST elevation myocardial infarction (STEMI)    a. Ant-Lat STEMI >> DES to D2 subbranch  . History of stroke   . History of TIA (transient ischemic attack)   . HTN (hypertension)   . Hyperlipidemia   .  Ischemic cardiomyopathy    a. Echo 6/17: Mid and distal anterior/inferior, septal and apical HK, mild concentric LVH, EF 30-35%, mild LAE  //  b. Echo 9/17 mild focal basal septal hypertrophy, EF 55-60%, normal wall motion, grade 1 diastolic dysfunction, mildly dilated aortic root (37 mm), normal RVSF, PASP 29 mmHg  . Lumbago   . Malignant neoplasm of prostate ([place])   . Nontraumatic rupture of tendons of biceps (long head)   . Paroxysmal atrial fibrillation ([facility])    during admit for STEMI 6/17 >> triple Rx high risk >> ASA + Plavix x 30 days post MI, then Eliquis + Plavix  . Sebaceous cyst   . Supraventricular premature beats   . Unspecified anomaly of tooth position      Review of Systems  Constitutional: Negative for chills, diaphoresis, fever and unexpected weight change.  Respiratory: Negative for chest tightness, shortness of breath and wheezing.   Musculoskeletal: Positive for neck pain and neck stiffness.      Objective:    BP 128/82 (BP Location: Left Arm, Patient Position: Sitting, Cuff Size: Normal)   Pulse (!) 53   Temp 98.7 F (37.1 C) (Oral)   Resp 16   Ht 5\' 10"  (1.778 m)   Wt 139 lb (63 kg)   SpO2 98%   BMI 19.94 kg/m  Nursing note and vital signs reviewed.  Physical Exam  Constitutional: He is oriented to person, place, and time. He appears well-developed and well-nourished. No distress.  Neck:  No obvious deformity, discoloration, or edema. Couple of tenderness and muscle spasm of the left upper trapezius noted. Range of motion significantly reduced and rotation and lateral bending. Distal pulses and sensation are intact and appropriate.  Cardiovascular: Normal rate, regular rhythm, normal heart sounds and intact distal pulses.   Pulmonary/Chest: Effort normal and breath sounds normal.  Neurological: He is alert and oriented to person, place, and time.  Skin: Skin is warm and dry.  Psychiatric: He has a normal mood and affect. His behavior is normal. Judgment  and thought content normal.       Assessment & Plan:   Problem List Items Addressed This Visit      Other   Cervical paraspinal muscle spasm - Primary    Symptoms and exam consistent with cervical paraspinal muscle spasms unrelated to trauma.Treat conservatively with ice, moist heat, and Tylenol as needed for discomfort. Home stretches provided in AVS with return demonstration provided. Follow up if symptoms worsen or do not improve.           I have discontinued Mr. Kost oseltamivir, benzonatate, and fluticasone. I am also having him maintain his Vitamin D-3, multivitamin, nitroGLYCERIN, apixaban, acetaminophen, lisinopril, atorvastatin, and clopidogrel.   Follow-up: Return if symptoms worsen or fail to improve.  Mauricio Po, FNP

## 2016-12-23 NOTE — Assessment & Plan Note (Signed)
Symptoms and exam consistent with cervical paraspinal muscle spasms unrelated to trauma.Treat conservatively with ice, moist heat, and Tylenol as needed for discomfort. Home stretches provided in AVS with return demonstration provided. Follow up if symptoms worsen or do not improve.

## 2016-12-23 NOTE — Patient Outreach (Addendum)
[facility]) Care Management  12/23/2016  Henry Franklin 1926-10-15 [id number]  Nurse call line Nurse call line date; 12/22/16 REFERRAL REASON; neck stiffiness Nurse call line recommendation: follow up with doctor within 3 days [facility] care management referral date: 12/22/16   SUBJECTIVE: Telephone call to patient. Contact answering phone states patient is not available. Contact states patient was able to get an appointment with the sports medicine doctor for today. Contact states patients primary MD was out on vacation.   Received return call from patient. HIPAA verified. Patient states he has been having stiffness in his neck but denies pain unless he moves his neck to far.  Patient described pain as sharp when neck is moved to far. Patient reports he has an appointment scheduled with the sports medicine doctor.  Patient states he has not taken any medication or used an alternative to assist with the neck pain or stiffness. Patient states he has tried to keep his neck from moving much.   Patient denies any further needs at this time.   Patient given 24 hour nurse advise line number. Patient instructed to call 911 for severe/ emergent symptoms.  Patient advised to follow up with doctor for other symptoms/ concerns.  PLAN: RNCM will refer patient to care management assistant due to patient being assessed and having no further needs.  RNCM will notify patients primary MD of closure.   Quinn Plowman RN,BSN,CCM [facility] Telephonic  [phone]

## 2016-12-24 ENCOUNTER — Ambulatory Visit: Payer: Self-pay

## 2017-01-08 DIAGNOSIS — M542 Cervicalgia: Secondary | ICD-10-CM | POA: Diagnosis not present

## 2017-02-10 ENCOUNTER — Telehealth: Payer: Self-pay | Admitting: Internal Medicine

## 2017-02-10 NOTE — Telephone Encounter (Signed)
Spoke with patient regarding AWV. Pt stated that he will need to give office a call back to schedule wellness appt. Pt has never had AWV before, appt can be scheduled at anytime.

## 2017-02-23 NOTE — Progress Notes (Signed)
Subjective:   Henry Franklin. is a 83 y.o. male who presents for an Initial Medicare Annual Wellness Visit.  Review of Systems  No ROS.  Medicare Wellness Visit. Additional risk factors are reflected in the social history.  Cardiac Risk Factors include: advanced age (>66men, >65 women);dyslipidemia;hypertension;male gender Sleep patterns: has frequent nighttime awakenings, does not get up to void, gets up 2-3 times nightly to void and sleeps 6-7 hours nightly.    Home Safety/Smoke Alarms: Feels safe in home. Smoke alarms in place.  Living environment; residence and Firearm Safety: 1-story house/ trailer, equipment: Hydrologist, Type: Tub Surveyor, quantity, no firearms.Lives with wife, no needs for DME, good support system Seat Belt Safety/Bike Helmet: Wears seat belt.     Objective:    Today's Vitals   02/24/17 1522 02/24/17 1526  BP: 136/85   Pulse: (!) 55   Temp: 97.7 F (36.5 C)   Weight: 136 lb (61.7 kg)   Height: 5\' 10"  (1.778 m)   PainSc:  1    Body mass index is 19.51 kg/m.  Current Medications (verified) Outpatient Encounter Prescriptions as of 02/24/2017  Medication Sig  . acetaminophen (TYLENOL) 325 MG tablet Take 2 tablets (650 mg total) by mouth every 6 (six) hours as needed for mild pain, moderate pain or fever.  [name] apixaban (ELIQUIS) 5 MG TABS tablet Take 1 tablet (5 mg total) by mouth 2 (two) times daily.  [name] atorvastatin (LIPITOR) 80 MG tablet TAKE 1 TABLET (80 MG TOTAL) BY MOUTH DAILY AT 6 PM.  . Cholecalciferol (VITAMIN D-3) 1000 UNITS CAPS Take 1 capsule by mouth daily. Take one tablet by mouth once daily.  . clopidogrel (PLAVIX) 75 MG tablet TAKE 1 TABLET (75 MG TOTAL) BY MOUTH DAILY WITH BREAKFAST.  [name] lisinopril (PRINIVIL,ZESTRIL) 2.5 MG tablet TAKE 1 TABLET (2.5 MG TOTAL) BY MOUTH DAILY.  . Multiple Vitamin (MULTIVITAMIN) tablet Take 1 tablet by mouth daily.  . nitroGLYCERIN (NITROSTAT) 0.4 MG SL tablet Place 1 tablet (0.4 mg total) under the tongue every 5  (five) minutes x 3 doses as needed for chest pain.   No facility-administered encounter medications on file as of 02/24/2017.     Allergies (verified) Patient has no known allergies.   History: Past Medical History:  Diagnosis Date  . CAD (coronary artery disease)    a. STEMI 6/17: LHC - oLAD 10, mLAD 50, dLAD 20, D2 inf subbranch 70 + sup subbranch 100, pRCA 20, oRPDA 40, EF 35-45% >> PCI:  2.25 x 12 mm resolute integrity DES to the superior subbranch of D2   . Essential and other specified forms of tremor   . External hemorrhoids without mention of complication   . History of ST elevation myocardial infarction (STEMI)    a. Ant-Lat STEMI >> DES to D2 subbranch  . History of stroke   . History of TIA (transient ischemic attack)   . HTN (hypertension)   . Hyperlipidemia   . Ischemic cardiomyopathy    a. Echo 6/17: Mid and distal anterior/inferior, septal and apical HK, mild concentric LVH, EF 30-35%, mild LAE  //  b. Echo 9/17 mild focal basal septal hypertrophy, EF 55-60%, normal wall motion, grade 1 diastolic dysfunction, mildly dilated aortic root (37 mm), normal RVSF, PASP 29 mmHg  . Lumbago   . Malignant neoplasm of prostate ([place])   . Nontraumatic rupture of tendons of biceps (long head)   . Paroxysmal atrial fibrillation ([place])    during admit for STEMI 6/17 >>  triple Rx high risk >> ASA + Plavix x 30 days post MI, then Eliquis + Plavix  . Sebaceous cyst   . Supraventricular premature beats   . Unspecified anomaly of tooth position    Past Surgical History:  Procedure Laterality Date  . APPENDECTOMY  1942  . BACK SURGERY     with sciatica  . CARDIAC CATHETERIZATION N/A 11/19/2015   Procedure: Left Heart Cath and Coronary Angiography;  Surgeon: Burnell Blanks, MD;  Location: [facility];  Service: Cardiovascular;  Laterality: N/A;  . CARDIAC CATHETERIZATION N/A 11/19/2015   Procedure: Coronary Stent Intervention;  Surgeon: Burnell Blanks, MD;  Location:  [facility];  Service: Cardiovascular;  Laterality: N/A;  . prostate cancer    . SPINE SURGERY N/A 2000   [place]   Family History  Problem Relation Age of Onset  . Cancer Father 17       brain tumor   Social History   Occupational History  . retired    Social History Main Topics  . Smoking status: Never Smoker  . Smokeless tobacco: Never Used  . Alcohol use No  . Drug use: No  . Sexual activity: Not on file   Tobacco Counseling Counseling given: Not Answered   Activities of Daily Living In your present state of health, do you have any difficulty performing the following activities: 02/24/2017  Hearing? N  Vision? N  Difficulty concentrating or making decisions? Y  Walking or climbing stairs? N  Dressing or bathing? N  Doing errands, shopping? N  Preparing Food and eating ? N  Using the Toilet? N  In the past six months, have you accidently leaked urine? N  Do you have problems with loss of bowel control? N  Managing your Medications? N  Managing your Finances? N  Housekeeping or managing your Housekeeping? N  Some recent data might be hidden    Immunizations and Health Maintenance Immunization History  Administered Date(s) Administered  . Influenza, High Dose Seasonal PF 03/05/2016  . Influenza-Unspecified 02/07/2009, 03/02/2013, 03/24/2014, 03/03/2015  . Pneumococcal Conjugate-13 05/31/2014  . Pneumococcal Polysaccharide-23 06/02/1998  . Tdap 06/24/2011  . Zoster 06/25/2006   Health Maintenance Due  Topic Date Due  . INFLUENZA VACCINE  12/31/2016    Patient Care Team: [name], MD as PCP - General (Internal Medicine) Earnie Larsson, MD as Consulting Physician (Neurosurgery) Darlin Coco, MD as Consulting Physician (Cardiology) Irene Shipper, MD as Consulting Physician (Gastroenterology) Lynne Logan, MD as Referring Physician (Urology) Rutherford Guys, MD as Consulting Physician (Ophthalmology) Allyn Kenner, MD (Dermatology)  Indicate  any recent Medical Services you may have received from other than [facility] providers in the past year (date may be approximate).    Assessment:   This is a routine wellness examination for [place]. Physical assessment deferred to PCP.  Hearing/Vision screen Hearing Screening Comments: Able to hear conversational tones w/o difficulty. No issues reported.  Passed whisper test Vision Screening Comments: Dr. Gershon Crane, appointment yearly   Dietary issues and exercise activities discussed: Current Exercise Habits: Structured exercise class, Type of exercise: strength training/weights;calisthenics;walking, Time (Minutes): 45, Frequency (Times/Week): 3, Weekly Exercise (Minutes/Week): 135, Intensity: Mild, Exercise limited by: Other - see comments (limited by neck and back pain)  Diet (meal preparation, eat out, water intake, caffeinated beverages, dairy products, fruits and vegetables): in general, a "healthy" diet  , well balanced,  Discussed supplementing with high protein ensure or boost, encouraged patient to increase daily water intake.  Goals    . I want to increase my physical activity          Go to the [facility] with wife 3 times weekly, increase the amount of fluid and water I drink to 6-8 cups per day.       Depression Screen PHQ 2/9 Scores 02/24/2017 04/28/2016 01/02/2016 12/18/2015  PHQ - 2 Score 0 0 0 0  PHQ- 9 Score 1 - - -    Fall Risk Fall Risk  02/24/2017 04/10/2016 01/02/2016 01/02/2016 12/18/2015  Falls in the past year? Yes (No Data) - Yes Yes  Comment - patient states he has not had any falls within the last few months.  - - -  Number falls in past yr: 1 1 - 1 1  Comment - - - - patient states he slipped in the bathroom  Injury with Fall? No - (No Data) No No  Comment - - patient reports he fell in the bathroom without injury - -  Risk for fall due to : Impaired mobility Impaired balance/gait Impaired balance/gait - -  Risk for fall due to: Comment - - patient reports he has had  problems with his balance for approximately 3 years. Patient reports he is currently in outpatient physical therapy for his balance issues - -  Follow up Falls prevention discussed Falls prevention discussed Falls prevention discussed - -    Cognitive Function: MMSE - Mini Mental State Exam 02/24/2017 04/05/2014  Orientation to time 5 4  Orientation to Place 5 5  Registration 3 3  Attention/ Calculation 3 5  Recall 2 1  Language- name 2 objects 2 2  Language- repeat 1 1  Language- follow 3 step command 3 3  Language- read & follow direction 1 1  Write a sentence 1 1  Copy design 1 1  Total score 27 27        Screening Tests Health Maintenance  Topic Date Due  . INFLUENZA VACCINE  12/31/2016  . TETANUS/TDAP  06/23/2021  . PNA vac Low Risk Adult  Completed        Plan:     I have personally reviewed and noted the following in the patient's chart:   . Medical and social history . Use of alcohol, tobacco or illicit drugs  . Current medications and supplements . Functional ability and status . Nutritional status . Physical activity . Advanced directives . List of other physicians . Vitals . Screenings to include cognitive, depression, and falls . Referrals and appointments  In addition, I have reviewed and discussed with patient certain preventive protocols, quality metrics, and best practice recommendations. A written personalized care plan for preventive services as well as general preventive health recommendations were provided to patient.     Michiel Cowboy, RN   02/24/2017     Medical screening examination/treatment/procedure(s) were performed by non-physician practitioner and as supervising physician I was immediately available for consultation/collaboration. I agree with above. [name], MD

## 2017-02-23 NOTE — Progress Notes (Signed)
Pre visit review using our clinic review tool, if applicable. No additional management support is needed unless otherwise documented below in the visit note. 

## 2017-02-24 ENCOUNTER — Ambulatory Visit (INDEPENDENT_AMBULATORY_CARE_PROVIDER_SITE_OTHER): Payer: PPO | Admitting: Internal Medicine

## 2017-02-24 ENCOUNTER — Encounter: Payer: Self-pay | Admitting: Internal Medicine

## 2017-02-24 VITALS — BP 136/85 | HR 55 | Temp 97.7°F | Ht 70.0 in | Wt 136.0 lb

## 2017-02-24 DIAGNOSIS — Z23 Encounter for immunization: Secondary | ICD-10-CM | POA: Diagnosis not present

## 2017-02-24 DIAGNOSIS — M542 Cervicalgia: Secondary | ICD-10-CM

## 2017-02-24 DIAGNOSIS — Z0000 Encounter for general adult medical examination without abnormal findings: Secondary | ICD-10-CM

## 2017-02-24 NOTE — Assessment & Plan Note (Signed)
Muscular in nature Mild, improving Deferred PT Tylenol, heat, stretching, hold off on using machines at gym until better Call if no improvement

## 2017-02-24 NOTE — Patient Instructions (Addendum)
Test(s) ordered today. Your results will be released to [place] (or called to you) after review, usually within 72hours after test completion. If any changes need to be made, you will be notified at that same time.  All other Health Maintenance issues reviewed.   All recommended immunizations and age-appropriate screenings are up-to-date or discussed.  No immunizations administered today.   Medications reviewed and updated.  Changes include  /  No changes recommended at this time.  Your prescription(s) have been submitted to your pharmacy. Please take as directed and contact our office if you believe you are having problem(s) with the medication(s).  A referral was ordered for   Please followup in   Continue doing brain stimulating activities (puzzles, reading, adult coloring books, staying active) to keep memory sharp.   Continue to eat heart healthy diet (full of fruits, vegetables, whole grains, lean protein, water--limit salt, fat, and sugar intake) and increase physical activity as tolerated.   Henry Franklin , Thank you for taking time to come for your Medicare Wellness Visit. I appreciate your ongoing commitment to your health goals. Please review the following plan we discussed and let me know if I can assist you in the future.   These are the goals we discussed: Goals    . I want to increase my physical activity          Go to the [facility] with wife 3 times weekly, increase the amount of fluid and water I drink to 6-8 cups per day.        This is a list of the screening recommended for you and due dates:  Health Maintenance  Topic Date Due  . Flu Shot  12/31/2016  . Tetanus Vaccine  06/23/2021  . Pneumonia vaccines  Completed   Influenza Virus Vaccine injection What is this medicine? INFLUENZA VIRUS VACCINE (in floo EN zuh VAHY ruhs vak SEEN) helps to reduce the risk of getting influenza also known as the flu. The vaccine only helps protect you against some strains of the  flu. This medicine may be used for other purposes; ask your health care provider or pharmacist if you have questions. COMMON BRAND NAME(S): Afluria, Agriflu, Alfuria, FLUAD, Fluarix, Fluarix Quadrivalent, Flublok, Flublok Quadrivalent, FLUCELVAX, Flulaval, Fluvirin, Fluzone, Fluzone High-Dose, Fluzone Intradermal What should I tell my health care provider before I take this medicine? They need to know if you have any of these conditions: -bleeding disorder like hemophilia -fever or infection -Guillain-Barre syndrome or other neurological problems -immune system problems -infection with the human immunodeficiency virus (HIV) or AIDS -low blood platelet counts -multiple sclerosis -an unusual or allergic reaction to influenza virus vaccine, latex, other medicines, foods, dyes, or preservatives. Different brands of vaccines contain different allergens. Some may contain latex or eggs. Talk to your doctor about your allergies to make sure that you get the right vaccine. -pregnant or trying to get pregnant -breast-feeding How should I use this medicine? This vaccine is for injection into a muscle or under the skin. It is given by a health care professional. A copy of Vaccine Information Statements will be given before each vaccination. Read this sheet carefully each time. The sheet may change frequently. Talk to your healthcare provider to see which vaccines are right for you. Some vaccines should not be used in all age groups. Overdosage: If you think you have taken too much of this medicine contact a poison control center or emergency room at once. NOTE: This medicine is only for you.  Do not share this medicine with others. What if I miss a dose? This does not apply. What may interact with this medicine? -chemotherapy or radiation therapy -medicines that lower your immune system like etanercept, anakinra, infliximab, and adalimumab -medicines that treat or prevent blood clots like  warfarin -phenytoin -steroid medicines like prednisone or cortisone -theophylline -vaccines This list may not describe all possible interactions. Give your health care provider a list of all the medicines, herbs, non-prescription drugs, or dietary supplements you use. Also tell them if you smoke, drink alcohol, or use illegal drugs. Some items may interact with your medicine. What should I watch for while using this medicine? Report any side effects that do not go away within 3 days to your doctor or health care professional. Call your health care provider if any unusual symptoms occur within 6 weeks of receiving this vaccine. You may still catch the flu, but the illness is not usually as bad. You cannot get the flu from the vaccine. The vaccine will not protect against colds or other illnesses that may cause fever. The vaccine is needed every year. What side effects may I notice from receiving this medicine? Side effects that you should report to your doctor or health care professional as soon as possible: -allergic reactions like skin rash, itching or hives, swelling of the face, lips, or tongue Side effects that usually do not require medical attention (report to your doctor or health care professional if they continue or are bothersome): -fever -headache -muscle aches and pains -pain, tenderness, redness, or swelling at the injection site -tiredness This list may not describe all possible side effects. Call your doctor for medical advice about side effects. You may report side effects to FDA at 1-800-FDA-1088. Where should I keep my medicine? The vaccine will be given by a health care professional in a clinic, pharmacy, doctor's office, or other health care setting. You will not be given vaccine doses to store at home. NOTE: This sheet is a summary. It may not cover all possible information. If you have questions about this medicine, talk to your doctor, pharmacist, or health care  provider.  2018 Elsevier/Gold Standard (2014-12-08 10:07:28)

## 2017-02-24 NOTE — Progress Notes (Signed)
Subjective:    Patient ID: Henry Franklin., male    DOB: [birth year], 83 y.o.   MRN: [id number]  HPI He is here for an acute visit.   Last week he had hiccups and had some posterior neck pain.  The pain is a soreness that was not severe in the posterior neck only.  With certain movements he has had pain in his neck.  He denies pain into his arms, numbness/tingling in his arms.  He has not had headache, lightheadedness.    The hiccups have resolved.  The neck pain is better.  He was working out at [employer] and wonders if he strained something doing the machines.  His father had a pituitary tumor and was worried about it.    Medications and allergies reviewed with patient and updated if appropriate.  Patient Active Problem List   Diagnosis Date Noted  . Cervical paraspinal muscle spasm 12/23/2016  . CAD (coronary artery disease) 12/03/2015  . Atrial fibrillation ([place]) 11/21/2015  . STEMI (ST elevation myocardial infarction) ([place]) 11/19/2015  . History of CVA (cerebrovascular accident) 05/31/2014  . Pulmonary embolism ([place]) 05/20/2014  . HTN (hypertension) 05/20/2014  . Prostate cancer ([place]) 04/05/2014  . Spinal stenosis of lumbar region 11/15/2013  . Falls 05/10/2013  . Hyperlipidemia   . Benign essential tremor   . Lumbago   . Palpitations   . Ingrown toenail without infection 09/07/2012    Current Outpatient Prescriptions on File Prior to Visit  Medication Sig Dispense Refill  . acetaminophen (TYLENOL) 325 MG tablet Take 2 tablets (650 mg total) by mouth every 6 (six) hours as needed for mild pain, moderate pain or fever. 60 tablet 0  . apixaban (ELIQUIS) 5 MG TABS tablet Take 1 tablet (5 mg total) by mouth 2 (two) times daily. 180 tablet 3  . atorvastatin (LIPITOR) 80 MG tablet TAKE 1 TABLET (80 MG TOTAL) BY MOUTH DAILY AT 6 PM. 30 tablet 4  . Cholecalciferol (VITAMIN D-3) 1000 UNITS CAPS Take 1 capsule by mouth daily. Take one tablet by mouth once daily.    . clopidogrel  (PLAVIX) 75 MG tablet TAKE 1 TABLET (75 MG TOTAL) BY MOUTH DAILY WITH BREAKFAST. 30 tablet 4  . lisinopril (PRINIVIL,ZESTRIL) 2.5 MG tablet TAKE 1 TABLET (2.5 MG TOTAL) BY MOUTH DAILY. 30 tablet 4  . Multiple Vitamin (MULTIVITAMIN) tablet Take 1 tablet by mouth daily.    . nitroGLYCERIN (NITROSTAT) 0.4 MG SL tablet Place 1 tablet (0.4 mg total) under the tongue every 5 (five) minutes x 3 doses as needed for chest pain. 25 tablet 3   No current facility-administered medications on file prior to visit.     Past Medical History:  Diagnosis Date  . CAD (coronary artery disease)    a. STEMI 6/17: LHC - oLAD 10, mLAD 50, dLAD 20, D2 inf subbranch 70 + sup subbranch 100, pRCA 20, oRPDA 40, EF 35-45% >> PCI:  2.25 x 12 mm resolute integrity DES to the superior subbranch of D2   . Essential and other specified forms of tremor   . External hemorrhoids without mention of complication   . History of ST elevation myocardial infarction (STEMI)    a. Ant-Lat STEMI >> DES to D2 subbranch  . History of stroke   . History of TIA (transient ischemic attack)   . HTN (hypertension)   . Hyperlipidemia   . Ischemic cardiomyopathy    a. Echo 6/17: Mid and distal anterior/inferior, septal and apical  HK, mild concentric LVH, EF 30-35%, mild LAE  //  b. Echo 9/17 mild focal basal septal hypertrophy, EF 55-60%, normal wall motion, grade 1 diastolic dysfunction, mildly dilated aortic root (37 mm), normal RVSF, PASP 29 mmHg  . Lumbago   . Malignant neoplasm of prostate ([place])   . Nontraumatic rupture of tendons of biceps (long head)   . Paroxysmal atrial fibrillation ([facility])    during admit for STEMI 6/17 >> triple Rx high risk >> ASA + Plavix x 30 days post MI, then Eliquis + Plavix  . Sebaceous cyst   . Supraventricular premature beats   . Unspecified anomaly of tooth position     Past Surgical History:  Procedure Laterality Date  . APPENDECTOMY  1942  . BACK SURGERY     with sciatica  . CARDIAC  CATHETERIZATION N/A 11/19/2015   Procedure: Left Heart Cath and Coronary Angiography;  Surgeon: Burnell Blanks, MD;  Location: [facility];  Service: Cardiovascular;  Laterality: N/A;  . CARDIAC CATHETERIZATION N/A 11/19/2015   Procedure: Coronary Stent Intervention;  Surgeon: Burnell Blanks, MD;  Location: [facility];  Service: Cardiovascular;  Laterality: N/A;  . prostate cancer    . SPINE SURGERY N/A 2000   [name]    Social History   Social History  . Marital status: Married    Spouse name: N/A  . Number of children: N/A  . Years of education: N/A   Occupational History  . retired    Social History Main Topics  . Smoking status: Never Smoker  . Smokeless tobacco: Never Used  . Alcohol use No  . Drug use: No  . Sexual activity: Not Asked   Other Topics Concern  . None   Social History Narrative  . None    Family History  Problem Relation Age of Onset  . Cancer Father 79       brain tumor    Review of Systems  Musculoskeletal: Positive for myalgias, neck pain and neck stiffness.  Neurological: Negative for dizziness, weakness, light-headedness, numbness and headaches.       Objective:   Vitals:   02/24/17 1522  BP: 136/85  Pulse: (!) 55  Temp: 97.7 F (36.5 C)   Filed Weights   02/24/17 1522  Weight: 136 lb (61.7 kg)   Body mass index is 19.51 kg/m.  Wt Readings from Last 3 Encounters:  02/24/17 136 lb (61.7 kg)  12/23/16 139 lb (63 kg)  04/29/16 147 lb 14.9 oz (67.1 kg)     Physical Exam  Constitutional: He appears well-developed and well-nourished. No distress.  Musculoskeletal: He exhibits no edema.  Mild tenderness in posterior neck muscles, slightly worse with movement - pain is mild  Neurological:  Normal sensation in b/l UE, normal strength in b/l UE  Skin: Skin is warm and dry. He is not diaphoretic.          Assessment & Plan:   See Problem List for Assessment and Plan of chronic medical  problems.

## 2017-03-09 ENCOUNTER — Ambulatory Visit: Payer: PPO | Admitting: Internal Medicine

## 2017-04-12 ENCOUNTER — Other Ambulatory Visit: Payer: Self-pay | Admitting: Physician Assistant

## 2017-05-19 ENCOUNTER — Other Ambulatory Visit: Payer: Self-pay | Admitting: Cardiology

## 2017-05-20 ENCOUNTER — Telehealth: Payer: Self-pay | Admitting: Cardiovascular Disease

## 2017-05-20 MED ORDER — NITROGLYCERIN 0.4 MG SL SUBL: 0.4000 mg | SUBLINGUAL_TABLET | SUBLINGUAL | 1 refills | Status: DC | PRN

## 2017-05-20 NOTE — Telephone Encounter (Signed)
Pt's medication was sent to pt's pharmacy as requested. Confirmation received.  °

## 2017-05-20 NOTE — Telephone Encounter (Signed)
Per pt's wife please fill his Nitrogliserin pt now has an appointment for Jan.

## 2017-06-08 ENCOUNTER — Telehealth: Payer: Self-pay | Admitting: Internal Medicine

## 2017-06-08 ENCOUNTER — Other Ambulatory Visit: Payer: Self-pay | Admitting: Physician Assistant

## 2017-06-08 NOTE — Telephone Encounter (Signed)
Ok to refill x 3 months. Richardson Dopp, PA-C    06/08/2017 9:59 PM

## 2017-06-08 NOTE — Telephone Encounter (Signed)
Pt requesting refills on Plavix, Lipitor, and Lisinopril. NOV is 07/03/17. LOV was 09/25. Please advise.

## 2017-06-08 NOTE — Telephone Encounter (Signed)
Copied from [place]. Topic: Quick Communication - See Telephone Encounter >> Jun 08, 2017  3:23 PM Bea Graff, NT wrote: CRM for notification. See Telephone encounter for: Pt needing a refill of clopidogrel (PLAVIX), Lipitor, and Lisinopril. Uses [facility] [address].  06/08/17.

## 2017-06-08 NOTE — Telephone Encounter (Signed)
These two medications are rx by pt cardiologist " Dr. Kathlen Mody". Will forward to cardiology for renewals...Johny Chess

## 2017-06-09 ENCOUNTER — Other Ambulatory Visit: Payer: Self-pay | Admitting: *Deleted

## 2017-06-09 MED ORDER — CLOPIDOGREL BISULFATE 75 MG PO TABS: ORAL_TABLET | ORAL | 3 refills | Status: DC

## 2017-06-09 MED ORDER — LISINOPRIL 2.5 MG PO TABS: ORAL_TABLET | ORAL | 3 refills | Status: DC

## 2017-06-09 MED ORDER — ATORVASTATIN CALCIUM 80 MG PO TABS: ORAL_TABLET | ORAL | 3 refills | Status: DC

## 2017-06-09 NOTE — Telephone Encounter (Signed)
Refills have been sent in. 

## 2017-06-09 NOTE — Telephone Encounter (Signed)
Refills sent in per Richardson Dopp, PA ok x 3 for Plavix, Lisinopril, Lipitor.

## 2017-06-24 ENCOUNTER — Ambulatory Visit: Payer: PPO | Admitting: Physician Assistant

## 2017-06-29 ENCOUNTER — Encounter: Payer: Self-pay | Admitting: Cardiology

## 2017-06-29 ENCOUNTER — Ambulatory Visit (INDEPENDENT_AMBULATORY_CARE_PROVIDER_SITE_OTHER): Payer: PPO | Admitting: Cardiology

## 2017-06-29 ENCOUNTER — Telehealth: Payer: Self-pay | Admitting: Physician Assistant

## 2017-06-29 VITALS — BP 148/66 | HR 57 | Resp 16 | Ht 70.0 in | Wt 141.0 lb

## 2017-06-29 DIAGNOSIS — I10 Essential (primary) hypertension: Secondary | ICD-10-CM

## 2017-06-29 DIAGNOSIS — E782 Mixed hyperlipidemia: Secondary | ICD-10-CM | POA: Diagnosis not present

## 2017-06-29 DIAGNOSIS — I480 Paroxysmal atrial fibrillation: Secondary | ICD-10-CM | POA: Diagnosis not present

## 2017-06-29 DIAGNOSIS — I2510 Atherosclerotic heart disease of native coronary artery without angina pectoris: Secondary | ICD-10-CM

## 2017-06-29 DIAGNOSIS — R0789 Other chest pain: Secondary | ICD-10-CM | POA: Diagnosis not present

## 2017-06-29 NOTE — Telephone Encounter (Signed)
Pt c/o of Chest Pain: STAT if CP now or developed within 24 hours  1. Are you having CP right now? yes  2. Are you experiencing any other symptoms (ex. SOB, nausea, vomiting, sweating)? no 3. How long have you been experiencing CP? A few days  4. Is your CP continuous or coming and going? Continuous, on the left side of his chest   5. Have you taken Nitroglycerin? no?

## 2017-06-29 NOTE — Patient Instructions (Signed)
Medication Instructions:  Your physician recommends that you continue on your current medications as directed. Please refer to the Current Medication list given to you today.   Labwork: NONE ORDERED TODAY  Testing/Procedures: Your physician has requested that you have en exercise stress myoview. For further information please visit [email or web address]. Please follow instruction sheet, as given.    Follow-Up: DR. Angelena Form IN 6-8 WEEKS   Any Other Special Instructions Will Be Listed Below (If Applicable).     If you need a refill on your cardiac medications before your next appointment, please call your pharmacy.

## 2017-06-29 NOTE — Telephone Encounter (Addendum)
Pt states that he has been having a constant dull pain on left side of his chest for the last 2 days. The pain does not changed with rest nor activity. Pt denies SOB, nauseas, vomiting nor sweating. Pain score is 2 to 3. Pt states its something new, and has not taken NTG SL. Pt has an appointment with [facility] Franklin on February 6th he would like to know if it can he be seen sooner.  An appointment was made with Henry Jakes NP for today at 2:00 PM. Left pt a message to verify that he knows about the appointment. Spoke with pt he is aware of 2:00 PM appointment.

## 2017-06-29 NOTE — Progress Notes (Signed)
Cardiology Office Note   Date:  06/29/2017   ID:  Henry Cotta., DOB [birth year], MRN [id number]  PCP:  [name], MD  Cardiologist: Dr. Angelena Form     Chief Complaint  Patient presents with  . Chest Pain    called today with chest pressure.       History of Present Illness: Henry Shouse. is a 84 y.o. male who presents for chest pressure.    He has a hx of HTN, HL, prior stroke/TIA, prostate CA and idiopathic pulmonary embolism. Patient was previously treated with Eliquis. This was stopped 11/17/15 by Pulmonology.   6/17 with an anterolateral STEMI. LHCdemonstrated an occluded superior subbranch of the second diagonal, moderate mid LAD stenosis and moderate stenosis in the inferior subbranch of the second diagonal. He underwent PCI of the superior subbranch of D2 with placement of a DES. Post MI, he developed PAF.CHADS2-VASc=7. Patient was placed on amiodarone.  Echo demonstrated EF 30-35.  ASA was DC'd after 30 days and then Eliquis was started.  We eventually stopped his Amiodarone and beta-blocker due to bradycardia.  Last seen 8/17.   No BB due to bradycardia   Plan for a fib if recurrent refer to EP possible dofetilide.      Today he stated he has had a more consistent chest pressure on Lt lat chest for a week.  Described as dull ache.  He did fall last week but does not remember hitting his chest.  No associated SOB or nausea.  He does have some balance issues.  He also has hip pain at times.  No orthopnea.    Past Medical History:  Diagnosis Date  . CAD (coronary artery disease)    a. STEMI 6/17: LHC - oLAD 10, mLAD 50, dLAD 20, D2 inf subbranch 70 + sup subbranch 100, pRCA 20, oRPDA 40, EF 35-45% >> PCI:  2.25 x 12 mm resolute integrity DES to the superior subbranch of D2   . Essential and other specified forms of tremor   . External hemorrhoids without mention of complication   . History of ST elevation myocardial infarction (STEMI)    a. Ant-Lat STEMI >>  DES to D2 subbranch  . History of stroke   . History of TIA (transient ischemic attack)   . HTN (hypertension)   . Hyperlipidemia   . Ischemic cardiomyopathy    a. Echo 6/17: Mid and distal anterior/inferior, septal and apical HK, mild concentric LVH, EF 30-35%, mild LAE  //  b. Echo 9/17 mild focal basal septal hypertrophy, EF 55-60%, normal wall motion, grade 1 diastolic dysfunction, mildly dilated aortic root (37 mm), normal RVSF, PASP 29 mmHg  . Lumbago   . Malignant neoplasm of prostate ([facility])   . Nontraumatic rupture of tendons of biceps (long head)   . Paroxysmal atrial fibrillation (HCC)    during admit for STEMI 6/17 >> triple Rx high risk >> ASA + Plavix x 30 days post MI, then Eliquis + Plavix  . Sebaceous cyst   . Supraventricular premature beats   . Unspecified anomaly of tooth position     Past Surgical History:  Procedure Laterality Date  . APPENDECTOMY  1942  . BACK SURGERY     with sciatica  . CARDIAC CATHETERIZATION N/A 11/19/2015   Procedure: Left Heart Cath and Coronary Angiography;  Surgeon: Burnell Blanks, MD;  Location: [facility];  Service: Cardiovascular;  Laterality: N/A;  . CARDIAC CATHETERIZATION N/A 11/19/2015  Procedure: Coronary Stent Intervention;  Surgeon: Burnell Blanks, MD;  Location: [facility];  Service: Cardiovascular;  Laterality: N/A;  . prostate cancer    . SPINE SURGERY N/A 2000   [name]     Current Outpatient Medications  Medication Sig Dispense Refill  . acetaminophen (TYLENOL) 325 MG tablet Take 2 tablets (650 mg total) by mouth every 6 (six) hours as needed for mild pain, moderate pain or fever. 60 tablet 0  . apixaban (ELIQUIS) 5 MG TABS tablet Take 1 tablet (5 mg total) by mouth 2 (two) times daily. 180 tablet 3  . atorvastatin (LIPITOR) 80 MG tablet Take one (1) tablet (80 mg) by mouth daily. 90 tablet 3  . Cholecalciferol (VITAMIN D-3) 1000 UNITS CAPS Take 1 capsule by mouth daily. Take one tablet by  mouth once daily.    . clopidogrel (PLAVIX) 75 MG tablet Take one (1) tablet (75 mg) by mouth daily. 90 tablet 3  . lisinopril (PRINIVIL,ZESTRIL) 2.5 MG tablet Take one (1) tablet (2.5 mg) by mouth daily. 90 tablet 3  . Multiple Vitamin (MULTIVITAMIN) tablet Take 1 tablet by mouth daily.    . nitroGLYCERIN (NITROSTAT) 0.4 MG SL tablet Place 1 tablet (0.4 mg total) under the tongue every 5 (five) minutes x 3 doses as needed for chest pain. Please keep upcoming appt. Thanks 25 tablet 1   No current facility-administered medications for this visit.     Allergies:   Patient has no known allergies.    Social History:  The patient  reports that  has never smoked. he has never used smokeless tobacco. He reports that he does not drink alcohol or use drugs.   Family History:  The patient's family history includes Cancer (age of onset: 12) in his father.    ROS:  General:no colds or fevers, no weight changes Skin:no rashes or ulcers HEENT:no blurred vision, no congestion CV:see HPI PUL:see HPI GI:no diarrhea constipation or melena, no indigestion GU:no hematuria, no dysuria MS:no joint pain, no claudication Neuro:no syncope, no lightheadedness Endo:no diabetes, no thyroid disease  Wt Readings from Last 3 Encounters:  06/29/17 141 lb (64 kg)  02/24/17 136 lb (61.7 kg)  12/23/16 139 lb (63 kg)     PHYSICAL EXAM: VS:  BP (!) 148/66   Pulse (!) 57   Resp 16   Ht 5\' 10"  (1.778 m)   Wt 141 lb (64 kg)   SpO2 98%   BMI 20.23 kg/m  , BMI Body mass index is 20.23 kg/m. General:Pleasant affect, NAD Skin:Warm and dry, brisk capillary refill HEENT:normocephalic, sclera clear, mucus membranes moist Neck:supple, no JVD, no bruits  Heart:S1S2 RRR without murmur, gallup, rub or click Lungs:clear without rales, rhonchi, or wheezes NGE:XBMW, non tender, + BS, do not palpate liver spleen or masses Ext:no lower ext edema, 2+ pedal pulses, 2+ radial pulses Neuro:alert and oriented X 3, MAE,  follows commands, + facial symmetry    EKG:  EKG is ordered today. The ekg ordered today demonstrates SB at 26.incomplete LBBB.    Recent Labs: No results found for requested labs within last 8760 hours.    Lipid Panel    Component Value Date/Time   CHOL 108 (L) 01/14/2016 0736   CHOL 174 07/03/2014 0820   TRIG 73 01/14/2016 0736   HDL 42 01/14/2016 0736   HDL 52 07/03/2014 0820   CHOLHDL 2.6 01/14/2016 0736   VLDL 15 01/14/2016 0736   LDLCALC 51 01/14/2016 0736   LDLCALC 100 (H)  07/03/2014 0820       Other studies Reviewed: Additional studies/ records that were reviewed today include: . Echo 02/20/16 Mild focal basal septal hypertrophy, EF 68-03, grade 1 diastolic dysfunction, mildly dilated aortic root (37 mm), PASP 29  Echo 11/21/15 Mid and distal anterior/inferior, septal and apical HK, mild concentric LVH, EF 30-35%, mild LAE  LHC 11/19/15 LAD ostial 10%, mid 50%, distal 20%, D2 inferior subbranch 70 and superior subbranch 100% LCx okay RCA proximal 20%, ostial RPDA 40% EF 35-45% with apical and anterior HK PCI: 2.25 x 12 mm resolute integrity DES to the superior subbranch of D2 1. Acute anterolateral STEMI 2. Occluded superior sub-branch of the moderate caliber Diagonal branch.  3. Successful PTCA/DES x 1 Diagonal 1 4. Moderate stenosis mid LAD (appears to be non flow limiting) 5. Mild non-obstructive disease RCA 6. Mild to moderate segmental LV systolic dysfunction 7. SVT during intervention  Myoview 7/05 Normal  ASSESSMENT AND PLAN:  1.  Chest pressure last stent was 2017, seems atypical but will proceed with exercise myoview.  Not sure he can walk but he would like to try.  If not can convert to [place].  Plan for follow up with Dr. Angelena Form.   2.  CAD on native coronary artery with chest pain possible angina  3.  PAF now awareness of a fib and SB today.   4.  HTN BP controlled  5.  HLD on lipitor, needs follow up statins on next visit.     Current medicines are reviewed with the patient today.  The patient Has no concerns regarding medicines.  The following changes have been made:  See above Labs/ tests ordered today include:see above  Disposition:   FU:  see above  Signed, Cecilie Kicks, NP  06/29/2017 2:05 PM    [facility] [place], [place], [place], [place] Phone: [phone]; Fax: [phone]

## 2017-06-30 ENCOUNTER — Telehealth (HOSPITAL_COMMUNITY): Payer: Self-pay | Admitting: *Deleted

## 2017-06-30 NOTE — Telephone Encounter (Signed)
Patient given detailed instructions per Myocardial Perfusion Study Information Sheet for the test on 07/02/17. Patient notified to arrive 15 minutes early and that it is imperative to arrive on time for appointment to keep from having the test rescheduled.  If you need to cancel or reschedule your appointment, please call the office within 24 hours of your appointment. . Patient verbalized understanding. Kirstie Peri

## 2017-07-02 ENCOUNTER — Ambulatory Visit (HOSPITAL_COMMUNITY): Payer: PPO | Attending: Internal Medicine

## 2017-07-02 DIAGNOSIS — R0789 Other chest pain: Secondary | ICD-10-CM | POA: Diagnosis not present

## 2017-07-02 LAB — MYOCARDIAL PERFUSION IMAGING
CHL CUP RESTING HR STRESS: 51 {beats}/min
LHR: 0.29
LVDIAVOL: 71 mL (ref 62–150)
LVSYSVOL: 30 mL
NUC STRESS TID: 0.83
Peak HR: 74 {beats}/min
SDS: 3
SRS: 7
SSS: 10

## 2017-07-02 MED ORDER — REGADENOSON 0.4 MG/5ML IV SOLN
0.4000 mg | Freq: Once | INTRAVENOUS | Status: AC
  Administered 2017-07-02: 0.4 mg via INTRAVENOUS

## 2017-07-02 MED ORDER — TECHNETIUM TC 99M TETROFOSMIN IV KIT
10.5000 | PACK | Freq: Once | INTRAVENOUS | Status: AC | PRN
  Administered 2017-07-02: 10.5 via INTRAVENOUS
  Filled 2017-07-02: qty 11

## 2017-07-02 MED ORDER — TECHNETIUM TC 99M TETROFOSMIN IV KIT
32.1000 | PACK | Freq: Once | INTRAVENOUS | Status: AC | PRN
  Administered 2017-07-02: 32.1 via INTRAVENOUS
  Filled 2017-07-02: qty 33

## 2017-07-03 ENCOUNTER — Ambulatory Visit: Payer: PPO | Admitting: Physician Assistant

## 2017-07-08 ENCOUNTER — Ambulatory Visit: Payer: PPO | Admitting: Physician Assistant

## 2017-08-03 DIAGNOSIS — S50862A Insect bite (nonvenomous) of left forearm, initial encounter: Secondary | ICD-10-CM | POA: Diagnosis not present

## 2017-08-03 DIAGNOSIS — L820 Inflamed seborrheic keratosis: Secondary | ICD-10-CM | POA: Diagnosis not present

## 2017-08-13 ENCOUNTER — Encounter: Payer: Self-pay | Admitting: Cardiovascular Disease

## 2017-08-13 ENCOUNTER — Ambulatory Visit: Payer: PPO | Admitting: Cardiovascular Disease

## 2017-08-13 VITALS — BP 114/68 | HR 56 | Ht 70.0 in | Wt 143.6 lb

## 2017-08-13 DIAGNOSIS — I10 Essential (primary) hypertension: Secondary | ICD-10-CM

## 2017-08-13 DIAGNOSIS — I2510 Atherosclerotic heart disease of native coronary artery without angina pectoris: Secondary | ICD-10-CM

## 2017-08-13 DIAGNOSIS — E782 Mixed hyperlipidemia: Secondary | ICD-10-CM | POA: Diagnosis not present

## 2017-08-13 DIAGNOSIS — I480 Paroxysmal atrial fibrillation: Secondary | ICD-10-CM | POA: Diagnosis not present

## 2017-08-13 MED ORDER — ASPIRIN EC 81 MG PO TBEC: 81.0000 mg | DELAYED_RELEASE_TABLET | Freq: Every day | ORAL | 3 refills | Status: DC

## 2017-08-13 NOTE — Progress Notes (Signed)
Chief Complaint  Patient presents with  . Follow-up    CAD    History of Present Illness:84 yo male with history of CAD, HTN, HLD, prior CVA/TIA, prostate cancer and pulmonary embolism who is here today for cardiac follow up. I met him in June 2017 when he was admitted to [facility] with an acute anterolateral STEMI. I performed his cardiac cath. I have not see him since then. He has been seen several times in our office by APPs. Cardiac cath June 2017 with moderate LAD stenosis and occluded sub-branch of the Diagonal branch which was felt to be the culprit lesion. The Diagonal branch was treated with a drug eluting stent.  Following his MI, he developed atrial fibrillation and was placed on amiodarone and a beta blocker but both were stopped due to bradycardia. Echo June 2017 following his MI with LvEF=30-35% but repeat echo in September 2017 showed normal LV function. Eliquis was started given his PAF.  Nuclear stress test in January 2019 without evidence of ischemia.   He is here today for follow up. The patient denies any chest pain, dyspnea, palpitations, lower extremity edema, orthopnea, PND, dizziness, near syncope or syncope. He feels great. He has some balance issues.   Primary Care Physician: [name], MD  Past Medical History:  Diagnosis Date  . CAD (coronary artery disease)    a. STEMI 6/17: LHC - oLAD 10, mLAD 50, dLAD 20, D2 inf subbranch 70 + sup subbranch 100, pRCA 20, oRPDA 40, EF 35-45% >> PCI:  2.25 x 12 mm resolute integrity DES to the superior subbranch of D2   . Essential and other specified forms of tremor   . External hemorrhoids without mention of complication   . History of ST elevation myocardial infarction (STEMI)    a. Ant-Lat STEMI >> DES to D2 subbranch  . History of stroke   . History of TIA (transient ischemic attack)   . HTN (hypertension)   . Hyperlipidemia   . Ischemic cardiomyopathy    a. Echo 6/17: Mid and distal anterior/inferior, septal and apical  HK, mild concentric LVH, EF 30-35%, mild LAE  //  b. Echo 9/17 mild focal basal septal hypertrophy, EF 55-60%, normal wall motion, grade 1 diastolic dysfunction, mildly dilated aortic root (37 mm), normal RVSF, PASP 29 mmHg  . Lumbago   . Malignant neoplasm of prostate ([place])   . Nontraumatic rupture of tendons of biceps (long head)   . Paroxysmal atrial fibrillation (HCC)    during admit for STEMI 6/17 >> triple Rx high risk >> ASA + Plavix x 30 days post MI, then Eliquis + Plavix  . Sebaceous cyst   . Supraventricular premature beats   . Unspecified anomaly of tooth position     Past Surgical History:  Procedure Laterality Date  . APPENDECTOMY  1942  . BACK SURGERY     with sciatica  . CARDIAC CATHETERIZATION N/A 11/19/2015   Procedure: Left Heart Cath and Coronary Angiography;  Surgeon: Burnell Blanks, MD;  Location: [facility];  Service: Cardiovascular;  Laterality: N/A;  . CARDIAC CATHETERIZATION N/A 11/19/2015   Procedure: Coronary Stent Intervention;  Surgeon: Burnell Blanks, MD;  Location: [facility];  Service: Cardiovascular;  Laterality: N/A;  . prostate cancer    . SPINE SURGERY N/A 2000   [name]    Current Outpatient Medications  Medication Sig Dispense Refill  . acetaminophen (TYLENOL) 325 MG tablet Take 2 tablets (650 mg total) by  mouth every 6 (six) hours as needed for mild pain, moderate pain or fever. 60 tablet 0  . apixaban (ELIQUIS) 5 MG TABS tablet Take 1 tablet (5 mg total) by mouth 2 (two) times daily. 180 tablet 3  . atorvastatin (LIPITOR) 80 MG tablet Take one (1) tablet (80 mg) by mouth daily. 90 tablet 3  . Cholecalciferol (VITAMIN D-3) 1000 UNITS CAPS Take 1 capsule by mouth daily. Take one tablet by mouth once daily.    [name] lisinopril (PRINIVIL,ZESTRIL) 2.5 MG tablet Take one (1) tablet (2.5 mg) by mouth daily. 90 tablet 3  . Multiple Vitamin (MULTIVITAMIN) tablet Take 1 tablet by mouth daily.    . nitroGLYCERIN (NITROSTAT) 0.4 MG  SL tablet Place 1 tablet (0.4 mg total) under the tongue every 5 (five) minutes x 3 doses as needed for chest pain. Please keep upcoming appt. Thanks 25 tablet 1  . aspirin EC 81 MG tablet Take 1 tablet (81 mg total) by mouth daily. 90 tablet 3   No current facility-administered medications for this visit.     No Known Allergies  Social History   Socioeconomic History  . Marital status: Married    Spouse name: Not on file  . Number of children: Not on file  . Years of education: Not on file  . Highest education level: Not on file  Social Needs  . Financial resource strain: Not on file  . Food insecurity - worry: Not on file  . Food insecurity - inability: Not on file  . Transportation needs - medical: Not on file  . Transportation needs - non-medical: Not on file  Occupational History  . Occupation: retired  Tobacco Use  . Smoking status: Never Smoker  . Smokeless tobacco: Never Used  Substance and Sexual Activity  . Alcohol use: No    Alcohol/week: 0.0 oz  . Drug use: No  . Sexual activity: Not on file  Other Topics Concern  . Not on file  Social History Narrative  . Not on file    Family History  Problem Relation Age of Onset  . Cancer Father 11       brain tumor    Review of Systems:  As stated in the HPI and otherwise negative.   BP 114/68   Pulse (!) 56   Ht _0  (1.778 m)   Wt 143 lb 9.6 oz (65.1 kg)   SpO2 97%   BMI 20.60 kg/m   Physical Examination: General: Well developed, well nourished, NAD  HEENT: OP clear, mucus membranes moist  SKIN: warm, dry. No rashes. Neuro: No focal deficits  Musculoskeletal: Muscle strength 5/5 all ext  Psychiatric: Mood and affect normal  Neck: No JVD, no carotid bruits, no thyromegaly, no lymphadenopathy.  Lungs:Clear bilaterally, no wheezes, rhonci, crackles Cardiovascular: Regular rate and rhythm with ectopy. No murmurs, gallops or rubs. Abdomen:Soft. Bowel sounds present. Non-tender.  Extremities: No lower  extremity edema. Pulses are 2 + in the bilateral DP/PT.  EKG:  EKG is not ordered today. The ekg ordered today demonstrates   Recent Labs: No results found for requested labs within last 8760 hours.   Lipid Panel    Component Value Date/Time   CHOL 108 (L) 01/14/2016 0736   CHOL 174 07/03/2014 0820   TRIG 73 01/14/2016 0736   HDL 42 01/14/2016 0736   HDL 52 07/03/2014 0820   CHOLHDL 2.6 01/14/2016 0736   VLDL 15 01/14/2016 0736   LDLCALC 51 01/14/2016 0736   LDLCALC 100 (  H) 07/03/2014 0820     Wt Readings from Last 3 Encounters:  08/13/17 143 lb 9.6 oz (65.1 kg)  06/29/17 141 lb (64 kg)  02/24/17 136 lb (61.7 kg)     Other studies Reviewed: Additional studies/ records that were reviewed today include: .Hospital records, cath films, echo images. Office visits.  Review of the above records demonstrates:    Assessment and Plan:   1. CAD without angina: No chest pain. Will continue statin. He has not been on a beta blocker due to bradycardia. I think we can stop his Plavix today and start ASA 81 mg daily. He is on Eliquis for PAF as well.   2. Paroxysmal atrial fibrillation: He appears to be in sinus today. Will continue Eliqius. Check CBC since he is on Eliqjuis.   3. HTN: BP is well controlled. Check BMET today.   4. HLD: continue statin. Repeat lipids and LFTs. May be able to lower Lipitor dosage.   Current medicines are reviewed at length with the patient today.  The patient does not have concerns regarding medicines.  The following changes have been made:  Plavix stopped. ASA added.   Labs/ tests ordered today include:   Orders Placed This Encounter  Procedures  . Lipid Profile  . Comp Met (CMET)  . CBC     Disposition:   FU with me in 6 months.    Signed, [name], MD 08/13/2017 9:38 AM    [facility] [place], [place], [place]  [address] Phone: [phone]; Fax: [phone]

## 2017-08-13 NOTE — Patient Instructions (Addendum)
Medication Instructions:  Your physician has recommended you make the following change in your medication:  Stop clopidogrel. Start aspirin 81 mg by mouth daily.    Labwork: Your physician recommends that you return for lab work on 08/18/17.--Lipid, CMET and CBC.  This will be fasting. The lab opens at 7:30 AM   Testing/Procedures: none  Follow-Up: Your physician recommends that you schedule a follow-up appointment in: 6 months. Please call our office in about 3 months to schedule this appointment    Any Other Special Instructions Will Be Listed Below (If Applicable).     If you need a refill on your cardiac medications before your next appointment, please call your pharmacy.

## 2017-08-18 ENCOUNTER — Other Ambulatory Visit: Payer: PPO | Admitting: *Deleted

## 2017-08-18 DIAGNOSIS — I2510 Atherosclerotic heart disease of native coronary artery without angina pectoris: Secondary | ICD-10-CM | POA: Diagnosis not present

## 2017-08-18 DIAGNOSIS — E782 Mixed hyperlipidemia: Secondary | ICD-10-CM

## 2017-08-18 DIAGNOSIS — I480 Paroxysmal atrial fibrillation: Secondary | ICD-10-CM | POA: Diagnosis not present

## 2017-08-18 LAB — COMPREHENSIVE METABOLIC PANEL
ALBUMIN: 3.8 g/dL (ref 3.5–4.7)
ALT: 23 IU/L (ref 0–44)
AST: 25 IU/L (ref 0–40)
Albumin/Globulin Ratio: 1.8 (ref 1.2–2.2)
Alkaline Phosphatase: 72 IU/L (ref 39–117)
BUN / CREAT RATIO: 16 (ref 10–24)
BUN: 17 mg/dL (ref 8–27)
Bilirubin Total: 0.7 mg/dL (ref 0.0–1.2)
CHLORIDE: 103 mmol/L (ref 96–106)
CO2: 27 mmol/L (ref 20–29)
Calcium: 9.4 mg/dL (ref 8.6–10.2)
Creatinine, Ser: 1.06 mg/dL (ref 0.76–1.27)
GFR calc non Af Amer: 62 mL/min/{1.73_m2} (ref >59)
GFR, EST AFRICAN AMERICAN: 72 mL/min/{1.73_m2} (ref >59)
GLOBULIN, TOTAL: 2.1 g/dL (ref 1.5–4.5)
Glucose: 89 mg/dL (ref 65–99)
Potassium: 4.6 mmol/L (ref 3.5–5.2)
SODIUM: 143 mmol/L (ref 134–144)
TOTAL PROTEIN: 5.9 g/dL — AB (ref 6.0–8.5)

## 2017-08-18 LAB — CBC
HEMATOCRIT: 45.3 % (ref 37.5–51.0)
Hemoglobin: 15.8 g/dL (ref 13.0–17.7)
MCH: 31 pg (ref 26.6–33.0)
MCHC: 34.9 g/dL (ref 31.5–35.7)
MCV: 89 fL (ref 79–97)
PLATELETS: 195 10*3/uL (ref 150–379)
RBC: 5.1 x10E6/uL (ref 4.14–5.80)
RDW: 13.1 % (ref 12.3–15.4)
WBC: 6.2 10*3/uL (ref 3.4–10.8)

## 2017-08-18 LAB — LIPID PANEL
CHOLESTEROL TOTAL: 120 mg/dL (ref 100–199)
Chol/HDL Ratio: 2.5 ratio (ref 0.0–5.0)
HDL: 48 mg/dL (ref >39)
LDL Calculated: 59 mg/dL (ref 0–99)
TRIGLYCERIDES: 65 mg/dL (ref 0–149)
VLDL CHOLESTEROL CAL: 13 mg/dL (ref 5–40)

## 2017-08-26 ENCOUNTER — Other Ambulatory Visit: Payer: Self-pay | Admitting: *Deleted

## 2017-08-26 MED ORDER — ATORVASTATIN CALCIUM 40 MG PO TABS: 40.0000 mg | ORAL_TABLET | Freq: Every day | ORAL | 3 refills | Status: DC

## 2018-01-05 ENCOUNTER — Telehealth: Payer: Self-pay | Admitting: Internal Medicine

## 2018-01-05 NOTE — Telephone Encounter (Signed)
Copied from [place] [id number]. Topic: General - Other >> Jan 05, 2018 11:52 AM Cecelia Byars, NT wrote: Reason for CRM: Patients wife called and said she needs his CBC and also CMET  from March they are unable in make contact with the ordering provider to request the labs ,  [facility]  and the fax 3$ [phone] to Dr Ishmael Holter .please call him at [phone] if this can be done .

## 2018-01-05 NOTE — Telephone Encounter (Signed)
Spoke with pts wife. Labs were done by Cardiology, transferred call to Cardiology.

## 2018-01-06 ENCOUNTER — Telehealth: Payer: Self-pay | Admitting: *Deleted

## 2018-01-06 NOTE — Telephone Encounter (Signed)
Received call from [place] at [facility] requesting most recent lab work. Fax number is [phone].  Attention-Dr. Ishmael Holter.  Lab work from March faxed.

## 2018-02-08 ENCOUNTER — Telehealth: Payer: Self-pay | Admitting: Internal Medicine

## 2018-02-08 NOTE — Telephone Encounter (Signed)
Patient wants to think about it and call the office back to set up an appointment.

## 2018-02-08 NOTE — Telephone Encounter (Signed)
Copied from [place] [phone]. Topic: Quick Communication - See Telephone Encounter >> Feb 08, 2018 11:08 AM Synthia Innocent wrote: CRM for notification. See Telephone encounter for: 02/08/18. Patient requesting to speak with nurse regarding his back pain, has now moved to his right leg. Would like to know which provider Dr Quay Burow would recommend he see, Please advise

## 2018-02-08 NOTE — Telephone Encounter (Signed)
Would you like for patient to see Dr.Schmitz , Dr.Smith is booked out until the end of the month or so. Please advise, Thank you.

## 2018-02-08 NOTE — Telephone Encounter (Signed)
Yes schedule with dr Raeford Razor

## 2018-02-25 NOTE — Progress Notes (Addendum)
Subjective:   Henry Franklin. is a 84 y.o. male who presents for Medicare Annual/Subsequent preventive examination.  Review of Systems:  No ROS.  Medicare Wellness Visit. Additional risk factors are reflected in the social history.  Cardiac Risk Factors include: advanced age (>2men, >17 women);dyslipidemia;hypertension;male gender Sleep patterns: feels rested on waking, gets up 1-2 times nightly to void and sleeps 6-7 hours nightly.    Home Safety/Smoke Alarms: Feels safe in home. Smoke alarms in place.  Living environment; residence and Firearm Safety: 1-story house/ trailer, equipment: Radio producer, Type: [place], no firearms. Lives with wife, needs tub bench DME, good support system Seat Belt Safety/Bike Helmet: Wears seat belt.     Objective:    Vitals: BP 116/62   Pulse (!) 54   Resp 16   Ht 5\' 10"  (1.778 m)   Wt 141 lb (64 kg)   SpO2 98%   BMI 20.23 kg/m   Body mass index is 20.23 kg/m.  Advanced Directives 02/26/2018 02/24/2017 06/19/2016 01/22/2016 01/02/2016 11/29/2015 11/21/2015  Does Patient Have a Medical Advance Directive? No Yes No Yes Yes Yes No  Type of Advance Directive - [place];Living will - - (No Data) [place];Living will -  Does patient want to make changes to medical advance directive? Yes (ED - Information included in AVS) - - - Yes - information given - -  Copy of [place] in Chart? - No - copy requested - - - Yes -  Would patient like information on creating a medical advance directive? - - - - - - No - patient declined information    Tobacco Social History   Tobacco Use  Smoking Status Never Smoker  Smokeless Tobacco Never Used     Counseling given: Not Answered  Past Medical History:  Diagnosis Date  . CAD (coronary artery disease)    a. STEMI 6/17: LHC - oLAD 10, mLAD 50, dLAD 20, D2 inf subbranch 70 + sup subbranch 100, pRCA 20, oRPDA 40, EF 35-45% >> PCI:  2.25 x 12 mm resolute  integrity DES to the superior subbranch of D2   . Essential and other specified forms of tremor   . External hemorrhoids without mention of complication   . History of ST elevation myocardial infarction (STEMI)    a. Ant-Lat STEMI >> DES to D2 subbranch  . History of stroke   . History of TIA (transient ischemic attack)   . HTN (hypertension)   . Hyperlipidemia   . Ischemic cardiomyopathy    a. Echo 6/17: Mid and distal anterior/inferior, septal and apical HK, mild concentric LVH, EF 30-35%, mild LAE  //  b. Echo 9/17 mild focal basal septal hypertrophy, EF 55-60%, normal wall motion, grade 1 diastolic dysfunction, mildly dilated aortic root (37 mm), normal RVSF, PASP 29 mmHg  . Lumbago   . Malignant neoplasm of prostate ([place])   . Nontraumatic rupture of tendons of biceps (long head)   . Paroxysmal atrial fibrillation (HCC)    during admit for STEMI 6/17 >> triple Rx high risk >> ASA + Plavix x 30 days post MI, then Eliquis + Plavix  . Sebaceous cyst   . Supraventricular premature beats   . Unspecified anomaly of tooth position    Past Surgical History:  Procedure Laterality Date  . APPENDECTOMY  1942  . BACK SURGERY     with sciatica  . CARDIAC CATHETERIZATION N/A 11/19/2015   Procedure: Left Heart Cath and  Coronary Angiography;  Surgeon: Burnell Blanks, MD;  Location: [facility];  Service: Cardiovascular;  Laterality: N/A;  . CARDIAC CATHETERIZATION N/A 11/19/2015   Procedure: Coronary Stent Intervention;  Surgeon: Burnell Blanks, MD;  Location: [facility];  Service: Cardiovascular;  Laterality: N/A;  . prostate cancer    . SPINE SURGERY N/A 2000   [place]   Family History  Problem Relation Age of Onset  . Cancer Father 12       brain tumor   Social History   Socioeconomic History  . Marital status: Married    Spouse name: Not on file  . Number of children: 2  . Years of education: Not on file  . Highest education level: Not on file    Occupational History  . Occupation: retired  Scientific laboratory technician  . Financial resource strain: Not hard at all  . Food insecurity:    Worry: Never true    Inability: Never true  . Transportation needs:    Medical: No    Non-medical: No  Tobacco Use  . Smoking status: Never Smoker  . Smokeless tobacco: Never Used  Substance and Sexual Activity  . Alcohol use: No    Alcohol/week: 0.0 standard drinks  . Drug use: No  . Sexual activity: Not Currently  Lifestyle  . Physical activity:    Days per week: 0 days    Minutes per session: 0 min  . Stress: Only a little  Relationships  . Social connections:    Talks on phone: More than three times a week    Gets together: More than three times a week    Attends religious service: More than 4 times per year    Active member of club or organization: Yes    Attends meetings of clubs or organizations: More than 4 times per year    Relationship status: Married  Other Topics Concern  . Not on file  Social History Narrative  . Not on file    Outpatient Encounter Medications as of 02/26/2018  Medication Sig  . acetaminophen (TYLENOL) 325 MG tablet Take 2 tablets (650 mg total) by mouth every 6 (six) hours as needed for mild pain, moderate pain or fever.  [name] apixaban (ELIQUIS) 5 MG TABS tablet Take 1 tablet (5 mg total) by mouth 2 (two) times daily.  [name] aspirin EC 81 MG tablet Take 1 tablet (81 mg total) by mouth daily.  . Cholecalciferol (VITAMIN D-3) 1000 UNITS CAPS Take 1 capsule by mouth daily. Take one tablet by mouth once daily.  [name] lisinopril (PRINIVIL,ZESTRIL) 2.5 MG tablet Take one (1) tablet (2.5 mg) by mouth daily.  . Multiple Vitamin (MULTIVITAMIN) tablet Take 1 tablet by mouth daily.  . nitroGLYCERIN (NITROSTAT) 0.4 MG SL tablet Place 1 tablet (0.4 mg total) under the tongue every 5 (five) minutes x 3 doses as needed for chest pain. Please keep upcoming appt. Thanks  . atorvastatin (LIPITOR) 40 MG tablet Take 1 tablet (40 mg total) by  mouth daily.   No facility-administered encounter medications on file as of 02/26/2018.     Activities of Daily Living In your present state of health, do you have any difficulty performing the following activities: 02/26/2018  Hearing? Y  Vision? N  Difficulty concentrating or making decisions? Y  Walking or climbing stairs? Y  Dressing or bathing? Y  Doing errands, shopping? Y  Preparing Food and eating ? N  Using the Toilet? N  In the past six  months, have you accidently leaked urine? N  Do you have problems with loss of bowel control? N  Managing your Medications? Y  Managing your Finances? Y  Housekeeping or managing your Housekeeping? Y  Some recent data might be hidden    Patient Care Team: [name], MD as PCP - General (Internal Medicine) Burnell Blanks, MD as PCP - Cardiology (Cardiology) Earnie Larsson, MD as Consulting Physician (Neurosurgery) Darlin Coco, MD as Consulting Physician (Cardiology) Irene Shipper, MD as Consulting Physician (Gastroenterology) Lynne Logan, MD as Referring Physician (Urology) Rutherford Guys, MD as Consulting Physician (Ophthalmology) Allyn Kenner, MD (Dermatology)   Assessment:   This is a routine wellness examination for Henry Franklin. Physical assessment deferred to PCP.  Exercise Activities and Dietary recommendations Current Exercise Habits: The patient does not participate in regular exercise at present  Diet (meal preparation, eat out, water intake, caffeinated beverages, dairy products, fruits and vegetables): in general, a "healthy" diet  , on average, 2 meals per day. Reports poor appetite at times.  Reviewed heart healthy diet. Encouraged patient to increase daily water and healthy fluid intake.  Discussed supplementing with Ensure, samples and coupons provided.  Goals    . I want to increase my physical activity     Go to the [facility] with wife 3 times weekly, increase the amount of fluid and water I drink to 6-8  cups per day.     . Patient Stated     Increase amount of fluids I drink. Try Ensure.       Fall Risk Fall Risk  02/26/2018 02/24/2017 04/10/2016 01/02/2016 01/02/2016  Falls in the past year? Yes Yes (No Data) - Yes  Comment - - patient states he has not had any falls within the last few months.  - -  Number falls in past yr: 2 or more 1 1 - 1  Comment - - - - -  Injury with Fall? - No - (No Data) No  Comment - - - patient reports he fell in the bathroom without injury -  Risk Factor Category  High Fall Risk - - - -  Risk for fall due to : Impaired mobility;Impaired balance/gait Impaired mobility Impaired balance/gait Impaired balance/gait -  Risk for fall due to: Comment - - - patient reports he has had problems with his balance for approximately 3 years. Patient reports he is currently in outpatient physical therapy for his balance issues -  Follow up Education provided;Falls prevention discussed Falls prevention discussed Falls prevention discussed Falls prevention discussed -   Depression Screen PHQ 2/9 Scores 02/26/2018 02/24/2017 04/28/2016 01/02/2016  PHQ - 2 Score 0 0 0 0  PHQ- 9 Score - 1 - -    Cognitive Function MMSE - Mini Mental State Exam 02/26/2018 02/24/2017 04/05/2014  Orientation to time 5 5 4   Orientation to Place 4 5 5   Registration 3 3 3   Attention/ Calculation 5 3 5   Recall 0 2 1  Language- name 2 objects 2 2 2   Language- repeat 1 1 1   Language- follow 3 step command 3 3 3   Language- read & follow direction 1 1 1   Write a sentence 1 1 1   Copy design 1 1 1   Total score 26 27 27         Immunization History  Administered Date(s) Administered  . Influenza, High Dose Seasonal PF 03/05/2016, 02/24/2017, 02/26/2018  . Influenza-Unspecified 02/07/2009, 03/02/2013, 03/24/2014, 03/03/2015  . Pneumococcal Conjugate-13 05/31/2014  . Pneumococcal Polysaccharide-23 06/02/1998  .  Tdap 06/24/2011  . Zoster 06/25/2006   Screening Tests Health Maintenance  Topic Date  Due  . TETANUS/TDAP  06/23/2021  . INFLUENZA VACCINE  Completed  . PNA vac Low Risk Adult  Completed       Plan:     Continue doing brain stimulating activities (puzzles, reading, adult coloring books, staying active) to keep memory sharp.   Continue to eat heart healthy diet (full of fruits, vegetables, whole grains, lean protein, water--limit salt, fat, and sugar intake) and increase physical activity as tolerated.  I have personally reviewed and noted the following in the patient's chart:   . Medical and social history . Use of alcohol, tobacco or illicit drugs  . Current medications and supplements . Functional ability and status . Nutritional status . Physical activity . Advanced directives . List of other physicians . Vitals . Screenings to include cognitive, depression, and falls . Referrals and appointments  In addition, I have reviewed and discussed with patient certain preventive protocols, quality metrics, and best practice recommendations. A written personalized care plan for preventive services as well as general preventive health recommendations were provided to patient.     Michiel Cowboy, RN  02/26/2018   Medical screening examination/treatment/procedure(s) were performed by non-physician practitioner and as supervising provider I was immediately available for consultation/collaboration.  I agree with above. Marrian Salvage, FNP

## 2018-02-26 ENCOUNTER — Ambulatory Visit (INDEPENDENT_AMBULATORY_CARE_PROVIDER_SITE_OTHER): Payer: PPO | Admitting: *Deleted

## 2018-02-26 VITALS — BP 116/62 | HR 54 | Resp 16 | Ht 70.0 in | Wt 141.0 lb

## 2018-02-26 DIAGNOSIS — Z23 Encounter for immunization: Secondary | ICD-10-CM | POA: Diagnosis not present

## 2018-02-26 DIAGNOSIS — Z0000 Encounter for general adult medical examination without abnormal findings: Secondary | ICD-10-CM | POA: Diagnosis not present

## 2018-02-26 DIAGNOSIS — Z9181 History of falling: Secondary | ICD-10-CM | POA: Diagnosis not present

## 2018-02-26 NOTE — Patient Instructions (Addendum)
Continue doing brain stimulating activities (puzzles, reading, adult coloring books, staying active) to keep memory sharp.   Continue to eat heart healthy diet (full of fruits, vegetables, whole grains, lean protein, water--limit salt, fat, and sugar intake) and increase physical activity as tolerated.  [facility] store in [place], [place] in: [place] Address: [address], [place], [place] [address] Phone: [phone]  [facility] Speech pathologist in [place], [place] Address: [address] [place],  [address] Phone: [phone]   Henry Franklin , Thank you for taking time to come for your Medicare Wellness Visit. I appreciate your ongoing commitment to your health goals. Please review the following plan we discussed and let me know if I can assist you in the future.   These are the goals we discussed: Goals    . I want to increase my physical activity     Go to the [facility] with wife 3 times weekly, increase the amount of fluid and water I drink to 6-8 cups per day.     . Patient Stated     Increase amount of fluids I drink. Try Ensure.       This is a list of the screening recommended for you and due dates:  Health Maintenance  Topic Date Due  . Flu Shot  12/31/2017  . Tetanus Vaccine  06/23/2021  . Pneumonia vaccines  Completed   Health Maintenance, Male A healthy lifestyle and preventive care is important for your health and wellness. Ask your health care provider about what schedule of regular examinations is right for you. What should I know about weight and diet? Eat a Healthy Diet  Eat plenty of vegetables, fruits, whole grains, low-fat dairy products, and lean protein.  Do not eat a lot of foods high in solid fats, added sugars, or salt.  Maintain a Healthy Weight Regular exercise can help you achieve or maintain a healthy weight. You should:  Do at least 150 minutes of  exercise each week. The exercise should increase your heart rate and make you sweat (moderate-intensity exercise).  Do strength-training exercises at least twice a week.  Watch Your Levels of Cholesterol and Blood Lipids  Have your blood tested for lipids and cholesterol every 5 years starting at 82 years of age. If you are at high risk for heart disease, you should start having your blood tested when you are 82 years old. You may need to have your cholesterol levels checked more often if: ? Your lipid or cholesterol levels are high. ? You are older than 82 years of age. ? You are at high risk for heart disease.  What should I know about cancer screening? Many types of cancers can be detected early and may often be prevented. Lung Cancer  You should be screened every year for lung cancer if: ? You are a current smoker who has smoked for at least 30 years. ? You are a former smoker who has quit within the past 15 years.  Talk to your health care provider about your screening options, when you should start screening, and how often you should be screened.  Colorectal Cancer  Routine colorectal cancer screening usually begins at 82 years of age and should be repeated every 5-10 years until you are 82 years old. You may need to be screened more often if early forms of precancerous polyps or small growths are found. Your health care provider may recommend screening at  an earlier age if you have risk factors for colon cancer.  Your health care provider may recommend using home test kits to check for hidden blood in the stool.  A small camera at the end of a tube can be used to examine your colon (sigmoidoscopy or colonoscopy). This checks for the earliest forms of colorectal cancer.  Prostate and Testicular Cancer  Depending on your age and overall health, your health care provider may do certain tests to screen for prostate and testicular cancer.  Talk to your health care provider about  any symptoms or concerns you have about testicular or prostate cancer.  Skin Cancer  Check your skin from head to toe regularly.  Tell your health care provider about any new moles or changes in moles, especially if: ? There is a change in a mole's size, shape, or color. ? You have a mole that is larger than a pencil eraser.  Always use sunscreen. Apply sunscreen liberally and repeat throughout the day.  Protect yourself by wearing long sleeves, pants, a wide-brimmed hat, and sunglasses when outside.  What should I know about heart disease, diabetes, and high blood pressure?  If you are 56-10 years of age, have your blood pressure checked every 3-5 years. If you are 63 years of age or older, have your blood pressure checked every year. You should have your blood pressure measured twice-once when you are at a hospital or clinic, and once when you are not at a hospital or clinic. Record the average of the two measurements. To check your blood pressure when you are not at a hospital or clinic, you can use: ? An automated blood pressure machine at a pharmacy. ? A home blood pressure monitor.  Talk to your health care provider about your target blood pressure.  If you are between 30-68 years old, ask your health care provider if you should take aspirin to prevent heart disease.  Have regular diabetes screenings by checking your fasting blood sugar level. ? If you are at a normal weight and have a low risk for diabetes, have this test once every three years after the age of 81. ? If you are overweight and have a high risk for diabetes, consider being tested at a younger age or more often.  A one-time screening for abdominal aortic aneurysm (AAA) by ultrasound is recommended for men aged 52-75 years who are current or former smokers. What should I know about preventing infection? Hepatitis B If you have a higher risk for hepatitis B, you should be screened for this virus. Talk with your  health care provider to find out if you are at risk for hepatitis B infection. Hepatitis C Blood testing is recommended for:  Everyone born from [birth year] through 1963.  Anyone with known risk factors for hepatitis C.  Sexually Transmitted Diseases (STDs)  You should be screened each year for STDs including gonorrhea and chlamydia if: ? You are sexually active and are younger than 82 years of age. ? You are older than 82 years of age and your health care provider tells you that you are at risk for this type of infection. ? Your sexual activity has changed since you were last screened and you are at an increased risk for chlamydia or gonorrhea. Ask your health care provider if you are at risk.  Talk with your health care provider about whether you are at high risk of being infected with HIV. Your health care provider may recommend a prescription  medicine to help prevent HIV infection.  What else can I do?  Schedule regular health, dental, and eye exams.  Stay current with your vaccines (immunizations).  Do not use any tobacco products, such as cigarettes, chewing tobacco, and e-cigarettes. If you need help quitting, ask your health care provider.  Limit alcohol intake to no more than 2 drinks per day. One drink equals 12 ounces of beer, 5 ounces of Travarius Lange, or 1 ounces of hard liquor.  Do not use street drugs.  Do not share needles.  Ask your health care provider for help if you need support or information about quitting drugs.  Tell your health care provider if you often feel depressed.  Tell your health care provider if you have ever been abused or do not feel safe at home. This information is not intended to replace advice given to you by your health care provider. Make sure you discuss any questions you have with your health care provider. Document Released: 11/15/2007 Document Revised: 01/16/2016 Document Reviewed: 02/20/2015 Elsevier Interactive Patient Education  [employer].

## 2018-03-29 ENCOUNTER — Telehealth: Payer: Self-pay | Admitting: Cardiovascular Disease

## 2018-03-29 NOTE — Telephone Encounter (Signed)
   Primary Cardiologist: [name], MD  Chart reviewed as part of pre-operative protocol coverage. Reviewed with pharmacist personally, for simple tooth cleaning the patient does not required to hold Eliquis or ASA.  If he need any tooth extraction, will need more information of # of teeth and type of anesthesia.    [place], [place] 03/29/2018, 3:32 PM

## 2018-03-29 NOTE — Telephone Encounter (Signed)
New message      [facility] Pre-operative Risk Assessment      1. What dental office are you calling from?  Dr Wyline Beady   2. What is your office phone number?  [phone]  3. What is your fax number?  [phone]  4. What type of procedure is the patient having performed? cleaning  5. What date is procedure scheduled or is the patient there now? now  6. What is your question (ex. Antibiotics prior to procedure, holding medication-we need to know how long dentist wants pt to hold med)? Hold blood thinner does he need pre med

## 2018-03-29 NOTE — Telephone Encounter (Signed)
Update note  Please fax and call with information

## 2018-03-29 NOTE — Telephone Encounter (Signed)
ROUTED TO PRE OP FOR PROVIDER RECCOMENDATIONS

## 2018-05-24 ENCOUNTER — Other Ambulatory Visit: Payer: Self-pay | Admitting: Physician Assistant

## 2018-07-20 ENCOUNTER — Telehealth: Payer: Self-pay | Admitting: Cardiovascular Disease

## 2018-07-20 ENCOUNTER — Ambulatory Visit: Payer: PPO | Admitting: Cardiology

## 2018-07-20 ENCOUNTER — Encounter: Payer: Self-pay | Admitting: Physician Assistant

## 2018-07-20 VITALS — BP 148/68 | HR 80 | Ht 70.0 in | Wt 145.8 lb

## 2018-07-20 DIAGNOSIS — I10 Essential (primary) hypertension: Secondary | ICD-10-CM | POA: Diagnosis not present

## 2018-07-20 DIAGNOSIS — I2510 Atherosclerotic heart disease of native coronary artery without angina pectoris: Secondary | ICD-10-CM | POA: Diagnosis not present

## 2018-07-20 DIAGNOSIS — I25118 Atherosclerotic heart disease of native coronary artery with other forms of angina pectoris: Secondary | ICD-10-CM | POA: Diagnosis not present

## 2018-07-20 DIAGNOSIS — I480 Paroxysmal atrial fibrillation: Secondary | ICD-10-CM

## 2018-07-20 DIAGNOSIS — R0789 Other chest pain: Secondary | ICD-10-CM | POA: Diagnosis not present

## 2018-07-20 DIAGNOSIS — E782 Mixed hyperlipidemia: Secondary | ICD-10-CM | POA: Diagnosis not present

## 2018-07-20 MED ORDER — ISOSORBIDE MONONITRATE ER 30 MG PO TB24: 30.0000 mg | ORAL_TABLET | Freq: Every day | ORAL | 3 refills | Status: DC

## 2018-07-20 NOTE — Progress Notes (Signed)
Cardiology Office Note   Date:  07/20/2018   ID:  [name], DOB [birth year], MRN [id number]  PCP:  [name], MD  Cardiologist:  Dr. Angelena Form  Chief Complaint  Patient presents with  . Chest Pain   History of Present Illness: Henry Franklin. is a 85 y.o. male who presents for 6 month follow up for CAD, HTN and HLD, seen for Dr. Angelena Form.  [name] has a prior hx of CAD, HTN, HLD, prior CVA/TIA, prostate cancer and pulmonary embolism. He was first seen by Dr. Angelena Form during a hospitalization from June 2017 with acute anterolateral STEMI.  He underwent a cardiac catheterization at that time which revealed moderate LAD stenosis and occluded subbranch of the diagonal which was felt to be the culprit lesion.  This was treated with a DES.  Following his MI, he developed atrial fibrillation and was placed on amiodarone and a beta-blocker however, both were stopped due to bradycardia.  Echocardiogram from 2017 following his MI with LVEF of 30 to 35% however repeat echocardiogram from 02/2016 showed normal LV function.  Eliquis was started given his PAF.  Nuclear stress test performed 06/2017 without evidence of ischemia.  He was last seen by Dr. Angelena Form 08/13/2017 in follow-up and was doing quite well.  He denied anginal symptoms and therefore, there were no medical changes made.  Today, patient reports he has been having intermittent "dull feeling" in the center of his chest since early this morning. He states he was in his usual state of health until this time with no symptoms since his STEMI in 2017. He states that the dullness lasted approximately 1-2 hours and dissipated on its own. He states that his chest dullness is a similar sensation as his prior MI in 2017 however, this was to a lesser degree and smaller area. He did not take SL NTG. He denies associated nausea, vomiting, diaphoresis. No reports of recent fever, chills or reflux symptoms. He states he has LE swelling however on  exam this appears to be isolated to his left ankle only without pain or redness.  He also states that he has been having frequent falls over the last several weeks.  In talking with him today, these appear to be mechanical with no associated dizziness or orthostasis.  He has been seen by the [facility] recently underwent imaging of his lower back which showed significant degeneration.  He has a follow-up appointment this Thursday to start physical therapy to help strengthen his muscles to prevent further de-escalation.  Past Medical History:  Diagnosis Date  . CAD (coronary artery disease)    a. STEMI 6/17: LHC - oLAD 10, mLAD 50, dLAD 20, D2 inf subbranch 70 + sup subbranch 100, pRCA 20, oRPDA 40, EF 35-45% >> PCI:  2.25 x 12 mm resolute integrity DES to the superior subbranch of D2   . Essential and other specified forms of tremor   . External hemorrhoids without mention of complication   . History of ST elevation myocardial infarction (STEMI)    a. Ant-Lat STEMI >> DES to D2 subbranch  . History of stroke   . History of TIA (transient ischemic attack)   . HTN (hypertension)   . Hyperlipidemia   . Ischemic cardiomyopathy    a. Echo 6/17: Mid and distal anterior/inferior, septal and apical HK, mild concentric LVH, EF 30-35%, mild LAE  //  b. Echo 9/17 mild focal basal septal hypertrophy, EF 55-60%, normal wall motion, grade  1 diastolic dysfunction, mildly dilated aortic root (37 mm), normal RVSF, PASP 29 mmHg  . Lumbago   . Malignant neoplasm of prostate ([place])   . Nontraumatic rupture of tendons of biceps (long head)   . Paroxysmal atrial fibrillation (HCC)    during admit for STEMI 6/17 >> triple Rx high risk >> ASA + Plavix x 30 days post MI, then Eliquis + Plavix  . Sebaceous cyst   . Supraventricular premature beats   . Unspecified anomaly of tooth position     Past Surgical History:  Procedure Laterality Date  . APPENDECTOMY  1942  . BACK SURGERY     with sciatica  . CARDIAC  CATHETERIZATION N/A 11/19/2015   Procedure: Left Heart Cath and Coronary Angiography;  Surgeon: Burnell Blanks, MD;  Location: [facility];  Service: Cardiovascular;  Laterality: N/A;  . CARDIAC CATHETERIZATION N/A 11/19/2015   Procedure: Coronary Stent Intervention;  Surgeon: Burnell Blanks, MD;  Location: [facility];  Service: Cardiovascular;  Laterality: N/A;  . prostate cancer    . SPINE SURGERY N/A 2000   Henry Franklin    Current Outpatient Medications  Medication Sig Dispense Refill  . acetaminophen (TYLENOL) 325 MG tablet Take 2 tablets (650 mg total) by mouth every 6 (six) hours as needed for mild pain, moderate pain or fever. 60 tablet 0  . apixaban (ELIQUIS) 5 MG TABS tablet Take 1 tablet (5 mg total) by mouth 2 (two) times daily. 180 tablet 3  . aspirin EC 81 MG tablet Take 1 tablet (81 mg total) by mouth daily. 90 tablet 3  . atorvastatin (LIPITOR) 40 MG tablet Take 1 tablet (40 mg total) by mouth daily. 90 tablet 3  . Cholecalciferol (VITAMIN D-3) 1000 UNITS CAPS Take 1 capsule by mouth daily. Take one tablet by mouth once daily.    [name] lisinopril (PRINIVIL,ZESTRIL) 2.5 MG tablet TAKE ONE (1) TABLET (2.5 MG) BY MOUTH DAILY. 90 tablet 0  . Multiple Vitamin (MULTIVITAMIN) tablet Take 1 tablet by mouth daily.    . nitroGLYCERIN (NITROSTAT) 0.4 MG SL tablet Place 1 tablet (0.4 mg total) under the tongue every 5 (five) minutes x 3 doses as needed for chest pain. Please keep upcoming appt. Thanks 25 tablet 1  . isosorbide mononitrate (IMDUR) 30 MG 24 hr tablet Take 1 tablet (30 mg total) by mouth daily. 90 tablet 3   No current facility-administered medications for this visit.     Allergies:   Patient has no known allergies.    Social History:  The patient  reports that he has never smoked. He has never used smokeless tobacco. He reports that he does not drink alcohol or use drugs.   Family History:  The patient's family history includes Cancer (age of onset:  40) in his father.    ROS:  Please see the history of present illness.   Otherwise, review of systems are positive for none.   All other systems are reviewed and negative.    PHYSICAL EXAM: VS:  BP (!) 148/68   Pulse 80   Ht 5\' 10"  (1.778 m)   Wt 145 lb 12.8 oz (66.1 kg)   SpO2 98%   BMI 20.92 kg/m  , BMI Body mass index is 20.92 kg/m.   General: Elderly, frail,  NAD Skin: Warm, dry, intact  Head: Normocephalic, atraumatic, clear, moist mucus membranes. Neck: Negative for carotid bruits. No JVD Lungs:Clear to ausculation bilaterally. No wheezes, rales, or rhonchi. Breathing is unlabored. Cardiovascular:  RRR with S1 S2. No murmurs, rubs, gallops, or LV heave appreciated. MSK: Strength and tone appear normal for age. 5/5 in all extremities Extremities: Mild left ankle edema. No clubbing or cyanosis. DP/PT pulses 2+ bilaterally Neuro: Alert and oriented. No focal deficits. No facial asymmetry. MAE spontaneously. Psych: Responds to questions appropriately with normal affect.     EKG:  EKG is ordered today. The ekg ordered today demonstrates NSR with PACs HR 52   Recent Labs: 08/18/2017: ALT 23; BUN 17; Creatinine, Ser 1.06; Hemoglobin 15.8; Platelets 195; Potassium 4.6; Sodium 143    Lipid Panel    Component Value Date/Time   CHOL 120 08/18/2017 0732   TRIG 65 08/18/2017 0732   HDL 48 08/18/2017 0732   CHOLHDL 2.5 08/18/2017 0732   CHOLHDL 2.6 01/14/2016 0736   VLDL 15 01/14/2016 0736   LDLCALC 59 08/18/2017 0732    Wt Readings from Last 3 Encounters:  07/20/18 145 lb 12.8 oz (66.1 kg)  02/26/18 141 lb (64 kg)  08/13/17 143 lb 9.6 oz (65.1 kg)     Other studies Reviewed: Additional studies/ records that were reviewed today include:   Lexiscan stress test: 07/02/2017:  Nuclear stress EF: 58%.  No T wave inversion was noted during stress.  There was no ST segment deviation noted during stress.  Defect 1: There is a large defect of moderate severity.  This is  a low risk study.   Large size, moderate intensity mostly fixed inferoseptal and lateral defects, likely attenuation artifact. No significant reversible ischemia. LVEF 58% with normal wall motion. This is a low risk study.  Cardiac catheterization 11/19/2015:   Prox RCA to Dist RCA lesion, 20% stenosed.  Ost RPDA lesion, 40% stenosed.  Ost LAD to Mid LAD lesion, 10% stenosed.  Mid LAD lesion, 50% stenosed.  Dist LAD lesion, 20% stenosed.  2nd Diag lesion, 70% stenosed.  Lat 2nd Diag lesion, 100% stenosed. Post intervention, there is a 0% residual stenosis.  There is mild to moderate left ventricular systolic dysfunction.   1. Acute anterolateral STEMI 2. Occluded superior sub-branch of the moderate caliber Diagonal branch.  3. Successful PTCA/DES x 1 Diagonal 1 4. Moderate stenosis mid LAD (appears to be non flow limiting) 5. Mild non-obstructive disease RCA 6. Mild to moderate segmental LV systolic dysfunction 7. SVT during intervention  Recommendations: Will admit to CCU. Continue ASA and Plavix. Will start beta blocker and statin. Echo in am. Anticipate d/c home 24-48 hours.   ASSESSMENT AND PLAN:  1.  Hx of CAD with c/o of chest dullness: -Patient reports "chest dullness " that began earlier this AM, similar to prior MI symptoms however not as severe.  -EKG without acute changes -Underwent a stress test 06/2017 which was without ischemia and was considered low risk  -Discussed option of further testing with stress test however given no acute changes, no current chest pain, one brief isolated chest dullness episode and his advanced age, will opt for medical therapy with the addition of Imdur 30mg  (BP 135/65) and will see him back in the office in 2-3 weeks.  -Will also obtain CBC and BMET to assess underlying disassociation  -Continue statin -No beta-blocker in the setting of bradycardia -Plavix was stopped at last office visit 07/2017 and ASA 81 mg daily was  started -Continue on Eliquis for PAF as well -Chest pain and ED precautions reviewed    2.  Paroxysmal atrial fibrillation: -EKG with SB with PACs today, HR 52 -History of PAF with  continuation of AC with Eliquis -Will obtain CBC and BMET today  -If falls continue, may need to re-consider [facility] given high risk of head bleed -This was discussed with with patient and wife  3.  HTN: -BP stable, initial BP 148/68 with repeat of 135/65 -Continue lisinopril 2.5  4.  HLD: -Continue statin -Last LDL, 59>>at goal   5. Frequent falls: -Pt reports several weeks of frequent mechanical falls without dizziness -[facility] for strength training -Will need to monitor closely, if continues may need to address AC -BP stable     Current medicines are reviewed at length with the patient today.  The patient does not have concerns regarding medicines.  The following changes have been made:  Add Imdur 30mg  daily   Labs/ tests ordered today include: CBC, BMET  Orders Placed This Encounter  Procedures  . Basic metabolic panel  . CBC  . EKG 12-Lead    Disposition:  FU with me  in 2 weeks  Signed, Kathyrn Drown, NP  07/20/2018 3:27 PM    [facility] [address], [place], [place]  [address] Phone: [phone]; Fax: [phone]

## 2018-07-20 NOTE — Patient Instructions (Signed)
Medication Instructions:  Your physician has recommended you make the following change in your medication:  1.  START Imdur 30 mg taking 1 tablet daily  If you need a refill on your cardiac medications before your next appointment, please call your pharmacy.   Lab work: TODAY:  CBC & BMET  If you have labs (blood work) drawn today and your tests are completely normal, you will receive your results only by: [name] MyChart Message (if you have MyChart) OR . A paper copy in the mail If you have any lab test that is abnormal or we need to change your treatment, we will call you to review the results.  Testing/Procedures: None ordered  Follow-Up: Your physician recommends that you schedule a follow-up appointment in: 08/03/2018 arrive at 1:30 to see Henry Dopp, PA-C / Henry Drown, NP  Any Other Special Instructions Will Be Listed Below (If Applicable).

## 2018-07-20 NOTE — Telephone Encounter (Signed)
Patient's spouse calling to report "dull feeling" in the center of his chest and swollen ankles for 3 days Stating it is not chest pain.    How much weight have you gained and in what time span? n/a 1) If swelling, where is the swelling located? ankles  2) Are you currently taking a fluid pill? no  3) Are you currently SOB? No  4) Do you have a log of your daily weights (if so, list)? n/a  5) Have you gained 3 pounds in a day or 5 pounds in a week? n/a  6) Have you traveled recently? No

## 2018-07-20 NOTE — Telephone Encounter (Signed)
Wife reports pt feeling a "dullness" in his chest the past 3 days.  Denies CP or that current dullness feeling is similar to his past MI. She also reports bilateral pedal edema. Pt scheduled to see PA this afternoon to discuss current complaint.  Pt also overdue for follow up. Wife agreeable to plan. They understand to arrive 15 min prior to 2pm appt.

## 2018-07-21 ENCOUNTER — Telehealth: Payer: Self-pay | Admitting: Physician Assistant

## 2018-07-21 LAB — BASIC METABOLIC PANEL
BUN/Creatinine Ratio: 21 (ref 10–24)
BUN: 20 mg/dL (ref 8–27)
CALCIUM: 9.2 mg/dL (ref 8.6–10.2)
CHLORIDE: 104 mmol/L (ref 96–106)
CO2: 26 mmol/L (ref 20–29)
Creatinine, Ser: 0.95 mg/dL (ref 0.76–1.27)
GFR calc Af Amer: 82 mL/min/{1.73_m2} (ref >59)
GFR calc non Af Amer: 71 mL/min/{1.73_m2} (ref >59)
GLUCOSE: 82 mg/dL (ref 65–99)
POTASSIUM: 4.5 mmol/L (ref 3.5–5.2)
SODIUM: 141 mmol/L (ref 134–144)

## 2018-07-21 LAB — CBC
HEMOGLOBIN: 16.1 g/dL (ref 13.0–17.7)
Hematocrit: 48.1 % (ref 37.5–51.0)
MCH: 31.1 pg (ref 26.6–33.0)
MCHC: 33.5 g/dL (ref 31.5–35.7)
MCV: 93 fL (ref 79–97)
Platelets: 196 10*3/uL (ref 150–450)
RBC: 5.17 x10E6/uL (ref 4.14–5.80)
RDW: 12 % (ref 11.6–15.4)
WBC: 6.1 10*3/uL (ref 3.4–10.8)

## 2018-07-21 NOTE — Telephone Encounter (Signed)
I spoke with pt. He reports he started Imdur this AM and 3 hours later had back pain that is worse than usual.  He feels this is related to Imdur and wants to stop this medication. I told pt I would send message to provider.  I asked him to follow up with primary care or urgent care if back pain does not improve.

## 2018-07-21 NOTE — Telephone Encounter (Signed)
New Message:    Patient is having some issue with some medication called Iundur and patient says his back hurts a lot. Please cause patient back

## 2018-07-21 NOTE — Telephone Encounter (Signed)
Sounds reasonable to stop Imdur due to side effects. Could consider Amlodipine 2.5 mg QD but I would see what Sharee Pimple thinks as she saw the patient and started the Imdur. Richardson Dopp, PA-C    07/21/2018 5:04 PM

## 2018-07-22 ENCOUNTER — Telehealth: Payer: Self-pay | Admitting: Physician Assistant

## 2018-07-22 ENCOUNTER — Encounter: Payer: Self-pay | Admitting: Cardiology

## 2018-07-22 NOTE — Telephone Encounter (Signed)
Spoke with pt's wife and pt has held Isosorbide today and feels better Will forward to Kathyrn Drown NP for review and recommendations ./cy

## 2018-07-22 NOTE — Telephone Encounter (Signed)
Pt c/o medication issue:  1. Name of Medication: isosorbide mononitrate (IMDUR) 30 MG 24 hr tablet  2. How are you currently taking this medication (dosage and times per day)?   3. Are you having a reaction (difficulty breathing--STAT)?   4. What is your medication issue? Patient's wife called stating the patient took this medication for the first time and it gave him extra back pain.  Patient already has spondylosis but the medication gave him extra back pain.

## 2018-07-23 NOTE — Telephone Encounter (Signed)
I spoke with pt and gave him information from Kathyrn Drown, NP.  Pt reports he has stopped Imdur.  He is no longer having chest dullness and does not want to start amlodipine. He will call us back if he wants to start this medication.

## 2018-07-23 NOTE — Telephone Encounter (Signed)
See previous phone note.  

## 2018-07-23 NOTE — Telephone Encounter (Signed)
Henry Franklin D, NP       4:00 PM  We can stop Imdur and add Amlodipine 2.5mg  daily

## 2018-08-03 ENCOUNTER — Ambulatory Visit: Payer: PPO | Admitting: Physician Assistant

## 2018-08-09 ENCOUNTER — Telehealth: Payer: Self-pay | Admitting: Internal Medicine

## 2018-08-09 DIAGNOSIS — R296 Repeated falls: Secondary | ICD-10-CM

## 2018-08-09 NOTE — Telephone Encounter (Signed)
Wife called back inquiring about her call this am.  She states pt has been falling, trouble keeping his balance, and she is concerned for him. She states she is not even sure a walker is what he needs. He just happened to see someone that had one and thought that may be a good idea Advised it would be better for the dr to see him and evaluate exactly what he may need. appt made for Wed 08/11/2018 at 1:30 pm.

## 2018-08-09 NOTE — Telephone Encounter (Signed)
Spoke with wife and she stated that she will talk about everything at appointment. States she does not know if he needs a walker or PT. Would like to discuss options further.   FYI.

## 2018-08-09 NOTE — Telephone Encounter (Signed)
Order printed.  He has been evaluated For this, but not here recently.  If insurance does not cover he may need to be seen or he can get it through the [place].

## 2018-08-09 NOTE — Telephone Encounter (Signed)
Agrees he needs further evaluation.

## 2018-08-09 NOTE — Telephone Encounter (Signed)
Copied from [employer] [id number]. Topic: Quick Communication - See Telephone Encounter >> Aug 09, 2018  8:14 AM Blase Mess A wrote: CRM for notification. See Telephone encounter for: 08/09/18.  Patients wife is calling to see if the patient call get a script for a walker. The patient has been falling.  Patient's wife declined appt for the falls. Please advise [phone]

## 2018-08-10 NOTE — Progress Notes (Signed)
Subjective:    Patient ID: Henry Cotta., male    DOB: 1927/05/24, 85 y.o.   MRN: [id number]  HPI The patient is here for an acute visit.  He is here with his wife.     Recurrent falls:  He has had multiple falls.  His falls seem to be related to imbalance. He denies chest pain, palps, lightheadedness, dizziness, and SOB.  He is a poor historian regarding recalling his falls.  His wife states he does not pick up his feet enough.  She has not seem most of his falls.  He uses a cane to ambulate.  They wonder if he needs a walker.  He has not done PT anytime recently.     He has not had any major injuries with his falls.  He is on a blood thinner and cardiology is aware of his falls.     Medications and allergies reviewed with patient and updated if appropriate.  Patient Active Problem List   Diagnosis Date Noted  . Neck pain 02/24/2017  . Cervical paraspinal muscle spasm 12/23/2016  . CAD (coronary artery disease) 12/03/2015  . Atrial fibrillation ([place]) 11/21/2015  . STEMI (ST elevation myocardial infarction) ([place]) 11/19/2015  . History of CVA (cerebrovascular accident) 05/31/2014  . Pulmonary embolism ([place]) 05/20/2014  . HTN (hypertension) 05/20/2014  . Prostate cancer ([place]) 04/05/2014  . Spinal stenosis of lumbar region 11/15/2013  . Falls 05/10/2013  . Hyperlipidemia   . Benign essential tremor   . Lumbago   . Palpitations   . Ingrown toenail without infection 09/07/2012    Current Outpatient Medications on File Prior to Visit  Medication Sig Dispense Refill  . acetaminophen (TYLENOL) 325 MG tablet Take 2 tablets (650 mg total) by mouth every 6 (six) hours as needed for mild pain, moderate pain or fever. 60 tablet 0  . apixaban (ELIQUIS) 5 MG TABS tablet Take 1 tablet (5 mg total) by mouth 2 (two) times daily. 180 tablet 3  . aspirin EC 81 MG tablet Take 1 tablet (81 mg total) by mouth daily. 90 tablet 3  . atorvastatin (LIPITOR) 40 MG tablet Take 1 tablet (40 mg  total) by mouth daily. 90 tablet 3  . Cholecalciferol (VITAMIN D-3) 1000 UNITS CAPS Take 1 capsule by mouth daily. Take one tablet by mouth once daily.    [name] lisinopril (PRINIVIL,ZESTRIL) 2.5 MG tablet TAKE ONE (1) TABLET (2.5 MG) BY MOUTH DAILY. 90 tablet 0  . Multiple Vitamin (MULTIVITAMIN) tablet Take 1 tablet by mouth daily.    . nitroGLYCERIN (NITROSTAT) 0.4 MG SL tablet Place 1 tablet (0.4 mg total) under the tongue every 5 (five) minutes x 3 doses as needed for chest pain. Please keep upcoming appt. Thanks 25 tablet 1   No current facility-administered medications on file prior to visit.     Past Medical History:  Diagnosis Date  . CAD (coronary artery disease)    a. STEMI 6/17: LHC - oLAD 10, mLAD 50, dLAD 20, D2 inf subbranch 70 + sup subbranch 100, pRCA 20, oRPDA 40, EF 35-45% >> PCI:  2.25 x 12 mm resolute integrity DES to the superior subbranch of D2   . Essential and other specified forms of tremor   . External hemorrhoids without mention of complication   . History of ST elevation myocardial infarction (STEMI)    a. Ant-Lat STEMI >> DES to D2 subbranch  . History of stroke   . History of TIA (transient ischemic attack)   .  HTN (hypertension)   . Hyperlipidemia   . Ischemic cardiomyopathy    a. Echo 6/17: Mid and distal anterior/inferior, septal and apical HK, mild concentric LVH, EF 30-35%, mild LAE  //  b. Echo 9/17 mild focal basal septal hypertrophy, EF 55-60%, normal wall motion, grade 1 diastolic dysfunction, mildly dilated aortic root (37 mm), normal RVSF, PASP 29 mmHg  . Lumbago   . Malignant neoplasm of prostate ([place])   . Nontraumatic rupture of tendons of biceps (long head)   . Paroxysmal atrial fibrillation (HCC)    during admit for STEMI 6/17 >> triple Rx high risk >> ASA + Plavix x 30 days post MI, then Eliquis + Plavix  . Sebaceous cyst   . Supraventricular premature beats   . Unspecified anomaly of tooth position     Past Surgical History:  Procedure  Laterality Date  . APPENDECTOMY  1942  . BACK SURGERY     with sciatica  . CARDIAC CATHETERIZATION N/A 11/19/2015   Procedure: Left Heart Cath and Coronary Angiography;  Surgeon: Burnell Blanks, MD;  Location: [place] [facility] [facility];  Service: Cardiovascular;  Laterality: N/A;  . CARDIAC CATHETERIZATION N/A 11/19/2015   Procedure: Coronary Stent Intervention;  Surgeon: Burnell Blanks, MD;  Location: [place] [facility];  Service: Cardiovascular;  Laterality: N/A;  . prostate cancer    . SPINE SURGERY N/A 2000   [name]    Social History   Socioeconomic History  . Marital status: Married    Spouse name: Not on file  . Number of children: 2  . Years of education: Not on file  . Highest education level: Not on file  Occupational History  . Occupation: retired  Scientific laboratory technician  . Financial resource strain: Not hard at all  . Food insecurity:    Worry: Never true    Inability: Never true  . Transportation needs:    Medical: No    Non-medical: No  Tobacco Use  . Smoking status: Never Smoker  . Smokeless tobacco: Never Used  Substance and Sexual Activity  . Alcohol use: No    Alcohol/week: 0.0 standard drinks  . Drug use: No  . Sexual activity: Not Currently  Lifestyle  . Physical activity:    Days per week: 0 days    Minutes per session: 0 min  . Stress: Only a little  Relationships  . Social connections:    Talks on phone: More than three times a week    Gets together: More than three times a week    Attends religious service: More than 4 times per year    Active member of club or organization: Yes    Attends meetings of clubs or organizations: More than 4 times per year    Relationship status: Married  Other Topics Concern  . Not on file  Social History Narrative  . Not on file    Family History  Problem Relation Age of Onset  . Cancer Father 9       brain tumor    Review of Systems  Constitutional: Negative for chills and fever.  Respiratory:  Positive for cough (chronic - mild congestion in chest) and shortness of breath (with moderate exertion). Negative for wheezing.   Cardiovascular: Positive for leg swelling (intermittent). Negative for chest pain and palpitations.  Musculoskeletal: Positive for arthralgias (right knee - dull, mild) and back pain (with prolonged walking).  Neurological: Negative for dizziness, light-headedness and headaches.  Psychiatric/Behavioral: Positive for agitation.  Objective:   Vitals:   08/11/18 1332  BP: 112/64  Pulse: (!) 53  Resp: 16  Temp: 97.7 F (36.5 C)  SpO2: 96%   BP Readings from Last 3 Encounters:  08/11/18 112/64  07/20/18 (!) 148/68  02/26/18 116/62   Wt Readings from Last 3 Encounters:  08/11/18 147 lb (66.7 kg)  07/20/18 145 lb 12.8 oz (66.1 kg)  02/26/18 141 lb (64 kg)   Body mass index is 21.09 kg/m.   Physical Exam    Constitutional: Appears well-developed and well-nourished. No distress.  HENT:  Head: Normocephalic and atraumatic.  Neck: Neck supple. No tracheal deviation present. No thyromegaly present.  No cervical lymphadenopathy Cardiovascular: Normal rate, regular rhythm and normal heart sounds.   No murmur heard. No carotid bruit .  Mild b/l LE edema Pulmonary/Chest: Effort normal and breath sounds normal. No respiratory distress. No has no wheezes. No rales.  Skin: Skin is warm and dry. Not diaphoretic. scabbed skin tears on left forearm Psychiatric: Normal mood and affect. Behavior is normal.       Assessment & Plan:    See Problem List for Assessment and Plan of chronic medical problems.

## 2018-08-11 ENCOUNTER — Encounter: Payer: Self-pay | Admitting: Internal Medicine

## 2018-08-11 ENCOUNTER — Ambulatory Visit (INDEPENDENT_AMBULATORY_CARE_PROVIDER_SITE_OTHER): Payer: PPO | Admitting: Internal Medicine

## 2018-08-11 ENCOUNTER — Other Ambulatory Visit: Payer: Self-pay

## 2018-08-11 VITALS — BP 112/64 | HR 53 | Temp 97.7°F | Resp 16 | Ht 70.0 in | Wt 147.0 lb

## 2018-08-11 DIAGNOSIS — R5381 Other malaise: Secondary | ICD-10-CM | POA: Diagnosis not present

## 2018-08-11 DIAGNOSIS — R2689 Other abnormalities of gait and mobility: Secondary | ICD-10-CM

## 2018-08-11 DIAGNOSIS — R296 Repeated falls: Secondary | ICD-10-CM

## 2018-08-11 NOTE — Assessment & Plan Note (Signed)
Has had frequent falls  - no major injuries Seems to be related to poor balance and physical deconditioning Will refer to PT - may need walker but will see what PT feels is best Continue cane

## 2018-08-11 NOTE — Patient Instructions (Signed)
A referral was ordered for Physical therapy.  They will call you to schedule an appointment.

## 2018-08-11 NOTE — Assessment & Plan Note (Signed)
Has had frequent falls  - no major injuries Seems to be related to poor balance and physical deconditioning He is on eliquis and cardiology is aware of his falls and has mentioned possible stopping medication Will refer to PT - may need walker but will see what PT feels is best Continue cane

## 2018-08-17 ENCOUNTER — Ambulatory Visit: Payer: PPO | Admitting: Physical Therapy

## 2018-08-26 ENCOUNTER — Other Ambulatory Visit: Payer: Self-pay | Admitting: Physician Assistant

## 2018-09-20 ENCOUNTER — Ambulatory Visit: Payer: PPO | Admitting: Physical Therapy

## 2018-09-21 ENCOUNTER — Other Ambulatory Visit: Payer: Self-pay | Admitting: Cardiovascular Disease

## 2018-12-21 ENCOUNTER — Other Ambulatory Visit: Payer: Self-pay | Admitting: Cardiovascular Disease

## 2019-02-11 ENCOUNTER — Ambulatory Visit (INDEPENDENT_AMBULATORY_CARE_PROVIDER_SITE_OTHER): Payer: PPO

## 2019-02-11 DIAGNOSIS — Z23 Encounter for immunization: Secondary | ICD-10-CM

## 2019-03-18 DIAGNOSIS — M545 Low back pain: Secondary | ICD-10-CM | POA: Diagnosis not present

## 2019-03-28 ENCOUNTER — Ambulatory Visit (INDEPENDENT_AMBULATORY_CARE_PROVIDER_SITE_OTHER): Payer: PPO | Admitting: *Deleted

## 2019-03-28 DIAGNOSIS — Z0000 Encounter for general adult medical examination without abnormal findings: Secondary | ICD-10-CM | POA: Diagnosis not present

## 2019-03-28 NOTE — Patient Instructions (Addendum)
If you cannot attend class in person, you can still exercise at home. Video taped versions of AHOY classes are shown on [employer] (GTN) at 8 am and 1 pm Mondays through Fridays. You can also purchase a copy of the AHOY DVD by calling [employer] (GTN) [employer]. GTN is available on [employer] channel 13 with a digital cable box and on [employer] channel 31. GTN is also available on [employer] U[employer]verse, channel 99. To view GTN, go to channel 99, press OK, select [place], then select GTN to start the channel.  Continue doing brain stimulating activities (puzzles, reading, adult coloring books, staying active) to keep memory sharp.   Continue to eat heart healthy diet (full of fruits, vegetables, whole grains, lean protein, water--limit salt, fat, and sugar intake) and increase physical activity as tolerated.   Henry Franklin , Thank you for taking time to come for your Medicare Wellness Visit. I appreciate your ongoing commitment to your health goals. Please review the following plan we discussed and let me know if I can assist you in the future.   These are the goals we discussed: Goals    . I want to increase my physical activity     Stay as physically active as I can. Increase the amount of fluid and water I drink to 6-8 cups per day.     . Patient Stated     Increase amount of fluids I drink. Try Ensure.       This is a list of the screening recommended for you and due dates:  Health Maintenance  Topic Date Due  . Tetanus Vaccine  06/23/2021  . Flu Shot  Completed  . Pneumonia vaccines  Completed    Preventive Care 11 Years and Older, Male Preventive care refers to lifestyle choices and visits with your health care provider that can promote health and wellness. This includes:  A yearly physical exam. This is also called an annual well check.  Regular dental and eye exams.  Immunizations.  Screening for certain conditions.  Healthy  lifestyle choices, such as diet and exercise. What can I expect for my preventive care visit? Physical exam Your health care provider will check:  Height and weight. These may be used to calculate body mass index (BMI), which is a measurement that tells if you are at a healthy weight.  Heart rate and blood pressure.  Your skin for abnormal spots. Counseling Your health care provider may ask you questions about:  Alcohol, tobacco, and drug use.  Emotional well-being.  Home and relationship well-being.  Sexual activity.  Eating habits.  History of falls.  Memory and ability to understand (cognition).  Work and work Statistician. What immunizations do I need?  Influenza (flu) vaccine  This is recommended every year. Tetanus, diphtheria, and pertussis (Tdap) vaccine  You may need a Td booster every 10 years. Varicella (chickenpox) vaccine  You may need this vaccine if you have not already been vaccinated. Zoster (shingles) vaccine  You may need this after age 17. Pneumococcal conjugate (PCV13) vaccine  One dose is recommended after age 82. Pneumococcal polysaccharide (PPSV23) vaccine  One dose is recommended after age 52. Measles, mumps, and rubella (MMR) vaccine  You may need at least one dose of MMR if you were born in 1957 or later. You may also need a second dose. Meningococcal conjugate (MenACWY) vaccine  You may need this if you have certain conditions. Hepatitis A vaccine  You may need this  if you have certain conditions or if you travel or work in places where you may be exposed to hepatitis A. Hepatitis B vaccine  You may need this if you have certain conditions or if you travel or work in places where you may be exposed to hepatitis B. Haemophilus influenzae type b (Hib) vaccine  You may need this if you have certain conditions. You may receive vaccines as individual doses or as more than one vaccine together in one shot (combination vaccines). Talk  with your health care provider about the risks and benefits of combination vaccines. What tests do I need? Blood tests  Lipid and cholesterol levels. These may be checked every 5 years, or more frequently depending on your overall health.  Hepatitis C test.  Hepatitis B test. Screening  Lung cancer screening. You may have this screening every year starting at age 68 if you have a 30-pack-year history of smoking and currently smoke or have quit within the past 15 years.  Colorectal cancer screening. All adults should have this screening starting at age 6 and continuing until age 8. Your health care provider may recommend screening at age 67 if you are at increased risk. You will have tests every 1-10 years, depending on your results and the type of screening test.  Prostate cancer screening. Recommendations will vary depending on your family history and other risks.  Diabetes screening. This is done by checking your blood sugar (glucose) after you have not eaten for a while (fasting). You may have this done every 1-3 years.  Abdominal aortic aneurysm (AAA) screening. You may need this if you are a current or former smoker.  Sexually transmitted disease (STD) testing. Follow these instructions at home: Eating and drinking  Eat a diet that includes fresh fruits and vegetables, whole grains, lean protein, and low-fat dairy products. Limit your intake of foods with high amounts of sugar, saturated fats, and salt.  Take vitamin and mineral supplements as recommended by your health care provider.  Do not drink alcohol if your health care provider tells you not to drink.  If you drink alcohol: ? Limit how much you have to 0-2 drinks a day. ? Be aware of how much alcohol is in your drink. In the [place] one drink equals one 12 oz bottle of beer (355 mL), one 5 oz glass of wine (148 mL), or one 1 oz glass of hard liquor (44 mL). Lifestyle  Take daily care of your teeth and gums.  Stay  active. Exercise for at least 30 minutes on 5 or more days each week.  Do not use any products that contain nicotine or tobacco, such as cigarettes, e-cigarettes, and chewing tobacco. If you need help quitting, ask your health care provider.  If you are sexually active, practice safe sex. Use a condom or other form of protection to prevent STIs (sexually transmitted infections).  Talk with your health care provider about taking a low-dose aspirin or statin. What's next?  Visit your health care provider once a year for a well check visit.  Ask your health care provider how often you should have your eyes and teeth checked.  Stay up to date on all vaccines. This information is not intended to replace advice given to you by your health care provider. Make sure you discuss any questions you have with your health care provider. Document Released: 06/15/2015 Document Revised: 05/13/2018 Document Reviewed: 05/13/2018 Elsevier Patient Education  2020 [employer].

## 2019-03-28 NOTE — Progress Notes (Addendum)
Subjective:   Henry Franklin. is a 85 y.o. male who presents for Medicare Annual/Subsequent preventive examination. I connected with patient by a telephone and verified that I am speaking with the correct person using two identifiers. Patient stated full name and DOB. Patient gave permission to continue with telephonic visit. Patient's location was at home and Nurse's location was at [place] office. Participants during this visit included patient and nurse.  Review of Systems:   Cardiac Risk Factors include: advanced age (>66men, >22 women);dyslipidemia;male gender Sleep patterns: feels rested on waking, gets up 1 times nightly to void and sleeps 7-8 hours nightly.    Home Safety/Smoke Alarms: Feels safe in home. Smoke alarms in place.  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 Lives with wife , no needs for DME, good support system Seat Belt Safety/Bike Helmet: Wears seat belt.     Objective:    Vitals: There were no vitals taken for this visit.  There is no height or weight on file to calculate BMI.  Advanced Directives 03/28/2019 02/26/2018 02/24/2017 06/19/2016 01/22/2016 01/02/2016 11/29/2015  Does Patient Have a Medical Advance Directive? Yes No Yes No Yes Yes Yes  Type of Paramedic of [place];Living will - [place];Living will - - (No Data) [place];Living will  Does patient want to make changes to medical advance directive? - Yes (ED - Information included in AVS) - - - Yes - information given -  Copy of [place] in Chart? No - copy requested - No - copy requested - - - Yes  Would patient like information on creating a medical advance directive? - - - - - - -    Tobacco Social History   Tobacco Use  Smoking Status Never Smoker  Smokeless Tobacco Never Used     Counseling given: Not Answered  Past  Medical History:  Diagnosis Date   CAD (coronary artery disease)    a. STEMI 6/17: LHC - oLAD 10, mLAD 50, dLAD 20, D2 inf subbranch 70 + sup subbranch 100, pRCA 20, oRPDA 40, EF 35-45% >> PCI:  2.25 x 12 mm resolute integrity DES to the superior subbranch of D2    Essential and other specified forms of tremor    External hemorrhoids without mention of complication    History of ST elevation myocardial infarction (STEMI)    a. Ant-Lat STEMI >> DES to D2 subbranch   History of stroke    History of TIA (transient ischemic attack)    HTN (hypertension)    Hyperlipidemia    Ischemic cardiomyopathy    a. Echo 6/17: Mid and distal anterior/inferior, septal and apical HK, mild concentric LVH, EF 30-35%, mild LAE  //  b. Echo 9/17 mild focal basal septal hypertrophy, EF 55-60%, normal wall motion, grade 1 diastolic dysfunction, mildly dilated aortic root (37 mm), normal RVSF, PASP 29 mmHg   Lumbago    Malignant neoplasm of prostate (HCC)    Nontraumatic rupture of tendons of biceps (long head)    Paroxysmal atrial fibrillation (HCC)    during admit for STEMI 6/17 >> triple Rx high risk >> ASA + Plavix x 30 days post MI, then Eliquis + Plavix   Sebaceous cyst    Supraventricular premature beats    Unspecified anomaly of tooth position    Past Surgical History:  Procedure Laterality Date   APPENDECTOMY  1942  BACK SURGERY     with sciatica   CARDIAC CATHETERIZATION N/A 11/19/2015   Procedure: Left Heart Cath and Coronary Angiography;  Surgeon: Burnell Blanks, MD;  Location: [facility];  Service: Cardiovascular;  Laterality: N/A;   CARDIAC CATHETERIZATION N/A 11/19/2015   Procedure: Coronary Stent Intervention;  Surgeon: Burnell Blanks, MD;  Location: [facility];  Service: Cardiovascular;  Laterality: N/A;   prostate cancer     SPINE SURGERY N/A 2000   [place]   Family History  Problem Relation Age of Onset   Cancer Father 68       brain  tumor   Social History   Socioeconomic History   Marital status: Married    Spouse name: Not on file   Number of children: 2   Years of education: Not on file   Highest education level: Not on file  Occupational History   Occupation: retired  Scientist, product/process development strain: Not hard at [employer] insecurity    Worry: Never true    Inability: Never true   Transportation needs    Medical: No    Non-medical: No  Tobacco Use   Smoking status: Never Smoker   Smokeless tobacco: Never Used  Substance and Sexual Activity   Alcohol use: No    Alcohol/week: 0.0 standard drinks   Drug use: No   Sexual activity: Not Currently  Lifestyle   Physical activity    Days per week: 0 days    Minutes per session: 0 min   Stress: Only a little  Relationships   Social connections    Talks on phone: More than three times a week    Gets together: More than three times a week    Attends religious service: More than 4 times per year    Active member of club or organization: Yes    Attends meetings of clubs or organizations: More than 4 times per year    Relationship status: Married  Other Topics Concern   Not on file  Social History Narrative   Not on file    Outpatient Encounter Medications as of 03/28/2019  Medication Sig   acetaminophen (TYLENOL) 325 MG tablet Take 2 tablets (650 mg total) by mouth every 6 (six) hours as needed for mild pain, moderate pain or fever.   apixaban (ELIQUIS) 5 MG TABS tablet Take 1 tablet (5 mg total) by mouth 2 (two) times daily.   aspirin EC 81 MG tablet Take 1 tablet (81 mg total) by mouth daily.   atorvastatin (LIPITOR) 40 MG tablet TAKE 1 TABLET BY MOUTH EVERY DAY   Cholecalciferol (VITAMIN D-3) 1000 UNITS CAPS Take 1 capsule by mouth daily. Take one tablet by mouth once daily.   lisinopril (PRINIVIL,ZESTRIL) 2.5 MG tablet TAKE ONE (1) TABLET (2.5 MG) BY MOUTH DAILY.   Multiple Vitamin (MULTIVITAMIN) tablet Take 1  tablet by mouth daily.   nitroGLYCERIN (NITROSTAT) 0.4 MG SL tablet Place 1 tablet (0.4 mg total) under the tongue every 5 (five) minutes x 3 doses as needed for chest pain. Please keep upcoming appt. Thanks   No facility-administered encounter medications on file as of 03/28/2019.     Activities of Daily Living In your present state of health, do you have any difficulty performing the following activities: 03/28/2019  Hearing? N  Vision? N  Difficulty concentrating or making decisions? N  Walking or climbing stairs? N  Dressing or bathing? N  Doing errands, shopping?  N  Preparing Food and eating ? N  Using the Toilet? N  In the past six months, have you accidently leaked urine? N  Do you have problems with loss of bowel control? N  Managing your Medications? N  Managing your Finances? N  Housekeeping or managing your Housekeeping? N  Some recent data might be hidden    Patient Care Team: [name], MD as PCP - General (Internal Medicine) Burnell Blanks, MD as PCP - Cardiology (Cardiology) Earnie Larsson, MD as Consulting Physician (Neurosurgery) Darlin Coco, MD as Consulting Physician (Cardiology) Irene Shipper, MD as Consulting Physician (Gastroenterology) Lynne Logan, MD as Referring Physician (Urology) Rutherford Guys, MD as Consulting Physician (Ophthalmology) Allyn Kenner, MD (Dermatology)   Assessment:   This is a routine wellness examination for [place]. Physical assessment deferred to PCP.   Exercise Activities and Dietary recommendations Current Exercise Habits: The patient does not participate in regular exercise at present, Exercise limited by: orthopedic condition(s)  Diet (meal preparation, eat out, water intake, caffeinated beverages, dairy products, fruits and vegetables): in general, a "healthy" diet  . Reports poor appetite at times.    Discussed supplementing with Ensure. Encouraged patient to increase daily water and healthy fluid  intake.  Goals     I want to increase my physical activity     Increase the amount of fluid and water I drink to 6-8 cups per day.      Patient Stated     Increase amount of fluids I drink. Try Ensure.       Fall Risk Fall Risk  03/28/2019 02/26/2018 02/24/2017 04/10/2016 01/02/2016  Falls in the past year? 1 Yes Yes (No Data) -  Comment - - - patient states he has not had any falls within the last few months.  -  Number falls in past yr: 1 2 or more 1 1 -  Comment - - - - -  Injury with Fall? 0 - No - (No Data)  Comment - - - - patient reports he fell in the bathroom without injury  Risk Factor Category  - High Fall Risk - - -  Risk for fall due to : Impaired mobility;Impaired balance/gait;History of fall(s) Impaired mobility;Impaired balance/gait Impaired mobility Impaired balance/gait Impaired balance/gait  Risk for fall due to: Comment - - - - patient reports he has had problems with his balance for approximately 3 years. Patient reports he is currently in outpatient physical therapy for his balance issues  Follow up - Education provided;Falls prevention discussed Falls prevention discussed Falls prevention discussed Falls prevention discussed   Is the patient's home free of loose throw rugs in walkways, pet beds, electrical cords, etc?   yes      Grab bars in the bathroom? yes, in the process of putting them in now.      Handrails on the stairs?   yes      Adequate lighting?   yes  Depression Screen PHQ 2/9 Scores 03/28/2019 02/26/2018 02/24/2017 04/28/2016  PHQ - 2 Score 1 0 0 0  PHQ- 9 Score - - 1 -    Cognitive Function MMSE - Mini Mental State Exam 02/26/2018 02/24/2017 04/05/2014  Orientation to time 5 5 4   Orientation to Place 4 5 5   Registration 3 3 3   Attention/ Calculation 5 3 5   Recall 0 2 1  Language- name 2 objects 2 2 2   Language- repeat 1 1 1   Language- follow 3 step command 3 3 3  Language- read & follow direction 1 1 1   Write a sentence 1 1 1   Copy design  1 1 1   Total score 26 27 27      6CIT Screen 03/28/2019  What Year? 0 points  What month? 0 points  What time? 0 points  Count back from 20 0 points  Months in reverse 0 points  Repeat phrase 2 points  Total Score 2    Immunization History  Administered Date(s) Administered   Fluad Quad(high Dose 65+) 02/11/2019   Influenza, High Dose Seasonal PF 03/05/2016, 02/24/2017, 02/26/2018   Influenza-Unspecified 02/07/2009, 03/02/2013, 03/24/2014, 03/03/2015   Pneumococcal Conjugate-13 05/31/2014   Pneumococcal Polysaccharide-23 06/02/1998   Tdap 06/24/2011   Zoster 06/25/2006   Screening Tests Health Maintenance  Topic Date Due   TETANUS/TDAP  06/23/2021   INFLUENZA VACCINE  Completed   PNA vac Low Risk Adult  Completed      Plan:    Reviewed health maintenance screenings with patient today and relevant education, vaccines, and/or referrals were provided.   I have personally reviewed and noted the following in the patients chart:    Medical and social history  Use of alcohol, tobacco or illicit drugs   Current medications and supplements  Functional ability and status  Nutritional status  Physical activity  Advanced directives  List of other physicians  Screenings to include cognitive, depression, and falls  Referrals and appointments  In addition, I have reviewed and discussed with patient certain preventive protocols, quality metrics, and best practice recommendations. A written personalized care plan for preventive services as well as general preventive health recommendations were provided to patient.     Michiel Cowboy, RN  03/28/2019    Medical screening examination/treatment/procedure(s) were performed by non-physician practitioner and as supervising physician I was immediately available for consultation/collaboration. I agree with above. [name], MD

## 2019-05-23 ENCOUNTER — Telehealth: Payer: Self-pay | Admitting: Cardiovascular Disease

## 2019-05-23 NOTE — Telephone Encounter (Signed)
   [facility] Pre-operative Risk Assessment    Request for surgical clearance:  1. What type of surgery is being performed? 2 TEETH TO BE FILLED   2. When is this surgery scheduled? TBD   3. What type of clearance is required (medical clearance vs. Pharmacy clearance to hold med vs. Both)? BOTH  4. Are there any medications that need to be held prior to surgery and how long? ELIQUIS   5. Practice name and name of physician performing surgery?  DR. Wyline Beady   6. What is your office phone number [phone]    7.   What is your office fax number [phone]  8.   Anesthesia type (None, local, MAC, general) ? LIDOCAINE   Henry Franklin 05/23/2019, 3:27 PM  _________________________________________________________________   (provider comments below)

## 2019-05-23 NOTE — Telephone Encounter (Signed)
Henry Franklin from Dr. Lurena Joiner Johnson's office would like to know how long the patient would need to hold apixaban (ELIQUIS) 5 MG TABS tablet before he had dental work done. States you can speak to anyone in the office.

## 2019-05-23 NOTE — Telephone Encounter (Signed)
Do not recommend holding anticoagulation for dental fillings, especially given patient's history of PE, afib, and CVA.

## 2019-05-24 NOTE — Telephone Encounter (Signed)
   Primary Cardiologist: [name], MD  Chart reviewed as part of pre-operative protocol coverage. Simple dental procedures are considered low risk procedures per guidelines and generally do not require any specific cardiac clearance. It is also generally accepted that for simple extractions, fillings and dental cleanings, there is no need to interrupt blood thinner therapy.   We do not recommend holding anticoagulation for dental fillings, especially given patient's history of PE, afib, and CVA.  SBE prophylaxis is not required for the patient.  I will route this recommendation to the requesting party via Epic fax function and remove from pre-op pool.  Please call with questions.  Daune Perch, NP 05/24/2019, 11:26 AM

## 2019-06-21 ENCOUNTER — Ambulatory Visit: Payer: PPO | Attending: Internal Medicine

## 2019-06-21 DIAGNOSIS — Z23 Encounter for immunization: Secondary | ICD-10-CM | POA: Insufficient documentation

## 2019-06-21 NOTE — Progress Notes (Signed)
   [id number]  Name:  Henry Franklin.    MRN: [id number] DOB: 1927-04-07  06/21/2019  [name] was observed post Covid-19 immunization for 15 minutes without incidence. He was provided with Vaccine Information Sheet and instruction to access the V-Safe system.   [name] was instructed to call 911 with any severe reactions post vaccine: [name] Difficulty breathing  . Swelling of your face and throat  . A fast heartbeat  . A bad rash all over your body  . Dizziness and weakness    Immunizations Administered    Name Date Dose VIS Date Route   Pfizer COVID-19 Vaccine 06/21/2019  2:20 PM 0.3 mL 05/13/2019 Intramuscular   Manufacturer: [place]   Lot: [id number]   [place]: [id number]

## 2019-07-12 ENCOUNTER — Ambulatory Visit: Payer: PPO

## 2019-07-12 ENCOUNTER — Ambulatory Visit: Payer: PPO | Attending: Internal Medicine

## 2019-07-12 DIAGNOSIS — Z23 Encounter for immunization: Secondary | ICD-10-CM | POA: Insufficient documentation

## 2019-07-12 NOTE — Progress Notes (Signed)
   [id number]  Name:  Henry Franklin.    MRN: [id number] DOB: June 07, 1926  07/12/2019  Henry Franklin was observed post Covid-19 immunization for 15 minutes without incidence. He was provided with Vaccine Information Sheet and instruction to access the V-Safe system.   Henry Franklin was instructed to call 911 with any severe reactions post vaccine: [name] Difficulty breathing  . Swelling of your face and throat  . A fast heartbeat  . A bad rash all over your body  . Dizziness and weakness    Immunizations Administered    Name Date Dose VIS Date Route   Pfizer COVID-19 Vaccine 07/12/2019  4:26 PM 0.3 mL 05/13/2019 Intramuscular   Manufacturer: [place]   Lot: [id number]   [place]: [id number]

## 2019-07-18 ENCOUNTER — Telehealth: Payer: Self-pay | Admitting: Internal Medicine

## 2019-07-18 NOTE — Chronic Care Management (AMB) (Signed)
  Chronic Care Management   Note  07/18/2019 Name: Jaquan Sadowsky. MRN: [id number] DOB: 1927/05/20  [name] is a 86 y.o. year old male who is a primary care patient of Burns, Claudina Lick, MD. I reached out to [name] by phone today in response to a referral sent by Mr. DAMONDRE PFEIFLE Jr.'s PCP, [name], MD.   Mr. Steele was given information about Chronic Care Management services today including:  1. CCM service includes personalized support from designated clinical staff supervised by his physician, including individualized plan of care and coordination with other care providers 2. 24/7 contact phone numbers for assistance for urgent and routine care needs. 3. Service will only be billed when office clinical staff spend 20 minutes or more in a month to coordinate care. 4. Only one practitioner may furnish and bill the service in a calendar month. 5. The patient may stop CCM services at any time (effective at the end of the month) by phone call to the office staff. 6. The patient will be responsible for cost sharing (co-pay) of up to 20% of the service fee (after annual deductible is met).  Patient agreed to services and verbal consent obtained.   Follow up plan:  Raynicia Dukes [employer] Scheduler

## 2019-07-18 NOTE — Progress Notes (Signed)
Opened in error           Henry Franklin [employer] Scheduler

## 2019-07-21 NOTE — Chronic Care Management (AMB) (Signed)
Chronic Care Management Pharmacy  Name: Henry Franklin.  MRN: [id number] DOB: 1926-07-13   Chief Complaint/ HPI  Henry Cotta.,  86 y.o. , male presents for their Initial CCM visit with the clinical pharmacist via telephone.  PCP : [name], MD  Their chronic conditions include: HTN, CAD (STEMI), Afib, PE, HLD, essential tremor, prostate cancer  Office Visits: 08/11/18 Dr Quay Burow: acute visit for multiple falls. Refer to PT. On Eliquis, cardiology aware of falls and have mentioned stopping Eliquis.  Consult Visit: 07/20/18 Dr Glenford Peers (cardiology): monitor falls closely, may need to reconsider Eliquis given high risk head bleed  Medications: Outpatient Encounter Medications as of 07/22/2019  Medication Sig  . acetaminophen (TYLENOL) 325 MG tablet Take 2 tablets (650 mg total) by mouth every 6 (six) hours as needed for mild pain, moderate pain or fever.  [name] apixaban (ELIQUIS) 5 MG TABS tablet Take 1 tablet (5 mg total) by mouth 2 (two) times daily.  [name] aspirin EC 81 MG tablet Take 1 tablet (81 mg total) by mouth daily.  [name] atorvastatin (LIPITOR) 40 MG tablet TAKE 1 TABLET BY MOUTH EVERY DAY (Patient taking differently: Take 40 mg by mouth every evening. )  . Cholecalciferol (VITAMIN D-3) 1000 UNITS CAPS Take 1 capsule by mouth daily. Take one tablet by mouth once daily.  [name] docusate sodium (COLACE) 100 MG capsule Take 100 mg by mouth daily.  [name] lisinopril (PRINIVIL,ZESTRIL) 2.5 MG tablet TAKE ONE (1) TABLET (2.5 MG) BY MOUTH DAILY. (Patient taking differently: Take 2.5 mg by mouth every evening. Take one (1) tablet (2.5 mg) by mouth daily.)  . Multiple Vitamin (MULTIVITAMIN) tablet Take 1 tablet by mouth daily.  . nitroGLYCERIN (NITROSTAT) 0.4 MG SL tablet Place 1 tablet (0.4 mg total) under the tongue every 5 (five) minutes x 3 doses as needed for chest pain. Please keep upcoming appt. Thanks  . polyethylene glycol (MIRALAX / GLYCOLAX) 17 g packet Take 17 g by mouth daily.   No  facility-administered encounter medications on file as of 07/22/2019.     Current Diagnosis/Assessment:  Goals Addressed            This Visit's Progress   . Pharmacy Care Plan       Current Barriers:  . Chronic Disease Management support, education, and care coordination needs related to CAD, HTN, and HLD, Afib/PE  Pharmacist Clinical Goal(s):  [name] Maintain BP between 110/60 and 130/80 . Improve ease of medication administration   Interventions: . Comprehensive medication review performed. [name] Utilize [facility] for medication packaging and delivery  Patient Self Care Activities:  . Wife administers medications as prescribed, Calls pharmacy for medication refills, and Calls provider office for new concerns or questions  Initial goal documentation       AFIB/PE   Patient is currently rate controlled.  Patient has failed these meds in past: n/a Patient is currently controlled on the following medications: Eliquis 5 mg BID  We discussed:  Risk of bleeding with aspirin and Eliquis, denies s/sx of bleeding.  Plan  Continue current medications   Hypertension   Office blood pressures are  BP Readings from Last 3 Encounters:  08/11/18 112/64  07/20/18 (!) 148/68  02/26/18 116/62   Patient has failed these meds in the past: n/a  Patient is currently controlled on the following meds: lisinopril 2.5 mg daily,   Patient checks BP at home infrequently  Patient home BP readings are ranging: n/a  We discussed BP  is consistently controlled in office, checking occasionally at home to make sure BP is not too low.  Plan  Continue current medications and control with diet and exercise   Hyperlipidemia/CAD   Lipid Panel     Component Value Date/Time   CHOL 120 08/18/2017 0732   TRIG 65 08/18/2017 0732   HDL 48 08/18/2017 0732   CHOLHDL 2.5 08/18/2017 0732   CHOLHDL 2.6 01/14/2016 0736   VLDL 15 01/14/2016 0736   LDLCALC 59 08/18/2017 0732   LABVLDL 13  08/18/2017 0732     Patient has failed these meds in past: n/a Patient is currently controlled on the following medications: atorvastatin 40 mg daily, nitroglycerin 0.4 mg SL prn, aspirin 81 mg daily  We discussed:  Atorvastatin was recently reduced to 40 mg at patient's request, thought there was no need for such high dose at his age.  Plan  Continue current medications and control with diet and exercise    Health Maintenance   Patient is currently controlled on the following medications: multivitamin, vitamin D3 1000 IU daily, Tylenol prn, docusate 100 mg daily, Miralax daily  We discussed:  Pt can take docusate twice a day if he still having issues with constipation. Per wife pt prefers docusate pill to Miralax powder.  Plan  Continue current medications   Medication Management   [facility] except Eliquis from [place] [facility] [name] Arbie Cookey sets up meds for him each day Pt endorses 100% compliance  We discussed:  Verbal consent obtained for [facility] enhanced pharmacy services (medication synchronization, adherence packaging, delivery coordination). A medication sync plan was created to allow patient to get all medications delivered once every 30 to 90 days per patient preference. Patient understands they have freedom to choose pharmacy and clinical pharmacist will coordinate care between all prescribers and [facility].   Plan  [facility] for pill packaging and delivery Continue Eliquis at [facility] for cost    Follow up: 6 month phone visit  Charlene Brooke, PharmD Clinical Pharmacist [facility] [phone]

## 2019-07-22 ENCOUNTER — Other Ambulatory Visit: Payer: Self-pay

## 2019-07-22 ENCOUNTER — Telehealth: Payer: Self-pay | Admitting: Cardiovascular Disease

## 2019-07-22 ENCOUNTER — Ambulatory Visit: Payer: PPO | Admitting: Pharmacist

## 2019-07-22 DIAGNOSIS — I480 Paroxysmal atrial fibrillation: Secondary | ICD-10-CM

## 2019-07-22 DIAGNOSIS — I10 Essential (primary) hypertension: Secondary | ICD-10-CM

## 2019-07-22 DIAGNOSIS — I25118 Atherosclerotic heart disease of native coronary artery with other forms of angina pectoris: Secondary | ICD-10-CM

## 2019-07-22 DIAGNOSIS — I2699 Other pulmonary embolism without acute cor pulmonale: Secondary | ICD-10-CM

## 2019-07-22 MED ORDER — LISINOPRIL 2.5 MG PO TABS: 2.5000 mg | ORAL_TABLET | Freq: Every day | ORAL | 0 refills | Status: DC

## 2019-07-22 MED ORDER — ATORVASTATIN CALCIUM 40 MG PO TABS: 40.0000 mg | ORAL_TABLET | Freq: Every day | ORAL | 0 refills | Status: DC

## 2019-07-22 NOTE — Telephone Encounter (Signed)
 *  STAT* If patient is at the pharmacy, call can be transferred to refill team.   1. Which medications need to be refilled? (please list name of each medication and dose if known)   lisinopril (PRINIVIL,ZESTRIL) 2.5 MG tablet atorvastatin (LIPITOR) 40 MG tablet  2. Which pharmacy/location (including street and city if local pharmacy) is medication to be sent to?  [facility]  3. Do they need a 30 day or 90 day supply? [place]

## 2019-07-22 NOTE — Patient Instructions (Addendum)
Visit Information  Thank you for meeting with me to discuss your medications! I look forward to working with you to achieve your health care goals. Below is a summary of what we talked about during the visit:  Goals Addressed            This Visit's Progress   . Pharmacy Care Plan       Current Barriers:  . Chronic Disease Management support, education, and care coordination needs related to CAD, HTN, and HLD, Afib/PE  Pharmacist Clinical Goal(s):  [name] Maintain BP between 110/60 and 130/80 . Improve ease of medication administration   Interventions: . Comprehensive medication review performed. [name] Util[facility] for medication packaging and delivery  Patient Self Care Activities:  . Wife administers medications as prescribed, Calls pharmacy for medication refills, and Calls provider office for new concerns or questions  Initial goal documentation       Henry Franklin was given information about Chronic Care Management services today including:  1. CCM service includes personalized support from designated clinical staff supervised by his physician, including individualized plan of care and coordination with other care providers 2. 24/7 contact phone numbers for assistance for urgent and routine care needs. 3. Service will only be billed when office clinical staff spend 20 minutes or more in a month to coordinate care. 4. Only one practitioner may furnish and bill the service in a calendar month. 5. The patient may stop CCM services at any time (effective at the end of the month) by phone call to the office staff. 6. The patient will be responsible for cost sharing (co-pay) of up to 20% of the service fee (after annual deductible is met).  Patient agreed to services and verbal consent obtained.   Print copy of patient instructions provided.  Telephone follow up appointment with pharmacy team member scheduled for: 01/20/20 @ 11:30am   Charlene Brooke, PharmD Clinical  Pharmacist [facility] [phone]    Constipation, Adult Constipation is when a person has fewer bowel movements in a week than normal, has difficulty having a bowel movement, or has stools that are dry, hard, or larger than normal. Constipation may be caused by an underlying condition. It may become worse with age if a person takes certain medicines and does not take in enough fluids. Follow these instructions at home: Eating and drinking   Eat foods that have a lot of fiber, such as fresh fruits and vegetables, whole grains, and beans.  Limit foods that are high in fat, low in fiber, or overly processed, such as french fries, hamburgers, cookies, candies, and soda.  Drink enough fluid to keep your urine clear or pale yellow. General instructions  Exercise regularly or as told by your health care provider.  Go to the restroom when you have the urge to go. Do not hold it in.  Take over-the-counter and prescription medicines only as told by your health care provider. These include any fiber supplements.  Practice pelvic floor retraining exercises, such as deep breathing while relaxing the lower abdomen and pelvic floor relaxation during bowel movements.  Watch your condition for any changes.  Keep all follow-up visits as told by your health care provider. This is important. Contact a health care provider if:  You have pain that gets worse.  You have a fever.  You do not have a bowel movement after 4 days.  You vomit.  You are not hungry.  You lose weight.  You are  bleeding from the anus.  You have thin, pencil-like stools. Get help right away if:  You have a fever and your symptoms suddenly get worse.  You leak stool or have blood in your stool.  Your abdomen is bloated.  You have severe pain in your abdomen.  You feel dizzy or you faint. This information is not intended to replace advice given to you by your health care provider.  Make sure you discuss any questions you have with your health care provider. Document Revised: 05/01/2017 Document Reviewed: 11/07/2015 Elsevier Patient Education  2020 [employer].

## 2019-07-22 NOTE — Progress Notes (Signed)
  I have reviewed this encounter including the documentation in this note and/or discussed this patient with the care management provider. I am certifying that I agree with the content of this note as supervising physician.  [name], MD   07/22/2019

## 2019-07-22 NOTE — Telephone Encounter (Signed)
Pt's medication was sent to pt's pharmacy as requested. Confirmation received.  °

## 2019-07-28 ENCOUNTER — Telehealth: Payer: Self-pay

## 2019-07-28 NOTE — Telephone Encounter (Signed)
Called and spoke with patient's wife, Arbie Cookey, and offered to make the virtual appointment to discuss home health options and she has denied this request. States that the last time she tried to bring someone in to help, he was not happy and did not do well with it. States that she thinks she can handle taking care of him herself, at this time. States that she would call back if she needed anything further.

## 2019-07-28 NOTE — Telephone Encounter (Signed)
-----   Message from Charlton Haws, [facility] sent at 07/28/2019 10:51 AM EST ----- Regarding: Schedule PCP appt Hi, this patient's wife Arbie Cookey has requested a virtual visit with his PCP (Dr Quay Burow) to discuss home health options. She is primary caregiver and is having a lot of trouble caring for him on her own.  Can you reach out to schedule that? She finds the schedule line confusing.  Thanks.

## 2019-08-04 ENCOUNTER — Telehealth: Payer: Self-pay

## 2019-08-04 NOTE — Telephone Encounter (Signed)
If he is chronically constipated we need to continue the MiraLAX daily and increase the cassette up to 3 pills a day.  The bleeding could be secondary to constipation.  If the bleeding continues he needs to be evaluated in the ED because there is nothing that we can do in the office to evaluate this.  If he is not eating lunch, having any abdominal pain or not improving she needs a taken to the emergency room.

## 2019-08-04 NOTE — Telephone Encounter (Signed)
Wife aware of response below. States pt is ok for now. Will try miralax and colace.

## 2019-08-04 NOTE — Telephone Encounter (Signed)
Patient's wife called and spoke with team health on  08/03/2019 12:37:23 PM and states that her husband is having trouble with his bowel movement and won't eat Lunch. Rectal bleeding. The patient is manually disimpacting himself and reports blood with bowel movements. Spouse reports recently constipation, patient is incontinent of bowels and wears Depends: she denies any active bleeding but has seen blood on the toilet paper with a very hard stool this morning.   Advised to Go to ED now.  Team Health Nurse, Tamala Fothergill, called and spoke with patient's wife because she was not originally with patient. States that the patient is refusing to be seen in [facility] or OFFICE. He would like to speak directly tih Dr. [name] at [phone] please. She said that he could call her if he ever needed her.

## 2019-08-04 NOTE — Telephone Encounter (Signed)
LVM for pt to call back in regards.  

## 2019-08-23 ENCOUNTER — Other Ambulatory Visit: Payer: Self-pay | Admitting: Physician Assistant

## 2019-08-23 ENCOUNTER — Other Ambulatory Visit: Payer: Self-pay | Admitting: *Deleted

## 2019-08-23 NOTE — Patient Outreach (Signed)
[place] [facility]) Care Management  08/23/2019  Henry Franklin 1927/02/25 [id number]   CSW made initial contact with pt's wife and confirmed pt's identity.  CSW introduced self and reason for call.  [name] acknowledges that her husband has some cognitive impairment and hearing loss.  She says he is able to walk with a walker down the driveway back and forth daily.  He had been referred to outpatient therapy at the [facility] but did not participate long.  [name] does the driving and states she is able to leave him home alone some.  Pt enjoys talking on the phone and she is concerned about solicitors as well as pt providing personal/financial info that he should not.   [name] feels pt would benefit from some therapy and is in agreement with CSW contacting PCP for home health orders.  [name] has a son and daughter in law who live nearby (across the street) and are helpful as they can be; son works and 2 young children.  They also have a daughter, Mickel Baas, and pt's wife has given CSW permission to call her to inform her of above plans as well as to screen for further needs/support.   CSW will plan to call daughter later this week and update at that time.    Eduard Clos, MSW, [place] Worker  [place] [phone]

## 2019-08-24 ENCOUNTER — Other Ambulatory Visit: Payer: Self-pay | Admitting: *Deleted

## 2019-08-24 ENCOUNTER — Other Ambulatory Visit: Payer: Self-pay | Admitting: Cardiovascular Disease

## 2019-08-24 ENCOUNTER — Other Ambulatory Visit: Payer: Self-pay | Admitting: Internal Medicine

## 2019-08-24 DIAGNOSIS — R2689 Other abnormalities of gait and mobility: Secondary | ICD-10-CM

## 2019-08-24 DIAGNOSIS — R5381 Other malaise: Secondary | ICD-10-CM

## 2019-08-24 DIAGNOSIS — R413 Other amnesia: Secondary | ICD-10-CM | POA: Insufficient documentation

## 2019-08-24 DIAGNOSIS — Z8673 Personal history of transient ischemic attack (TIA), and cerebral infarction without residual deficits: Secondary | ICD-10-CM

## 2019-08-24 NOTE — Patient Outreach (Signed)
[place] [facility]) Care Management  08/24/2019  Robinson Rahming 12/24/1926 [id number]   CSW attempted to reach pt's daughter to gain additional pertinent information for pt screening/assessment.  CSW was able to leave a HIPPA compliant voice message for her and will await callback.  CSW also has messaged Dr Quay Burow to request [facility] services be ordered and arranged.   Eduard Clos, MSW, [place] Worker  [place] [phone]

## 2019-08-25 ENCOUNTER — Telehealth: Payer: Self-pay

## 2019-08-25 NOTE — Telephone Encounter (Signed)
LVM for pt to call back to schedule an appointment for referral.

## 2019-08-25 NOTE — Patient Outreach (Signed)
[place] [facility]) Care Management  08/25/2019  Dainel Overbey 08-26-1926 [id number]   CSW spoke with pt's daughter, Mickel Baas, who lives in [place]. Daughter concurs with a lot of info provided by wife; pt weak, minimal activity some mild cognitive impairment and overall signs of "slowing down".  Daughter acknowledges that "he's almost 10" and yet wants to do whatever they can to keep him strong, mobile, active and safe.  "he has had some falls and it's getting hard for mom to get him out of the floor".  Dr. Quay Burow has ordered home health PT, OT and Speech (for cognitive work) and daughter and wife have been made aware. Family plans to talk with pt tomorrow about in home support [facility]) and to persuade/encourage/entice pt to participate.  Daughter and wife are uncertain if pt has Advance Directives in place. CSW discussed the importance of having these documents completed before you need it.  Daughter and son had attempted to have pt complete in past but "he wasn't having anything to do with it".  CSW discussed particularly the benefit of the Living Will and HCPOA.  CSW will provide Advance Directive packets for their review and completion as desired.   [name] feels the main culprit for pt is his hearing loss-  his inability to really watch and listen to tv, communicate with family and friends.  CSW discussed how this could also be impacting and influencing his cognitive impairment and his overall signs of withdrawing.  CSW will assist with seeking local options for hearing test.   CSW offered support and will plan follow up next week for further assistance and support.   Eduard Clos, MSW, [place] Worker  [place] [phone]

## 2019-08-25 NOTE — Telephone Encounter (Signed)
-----   Message from Octavio Manns sent at 08/25/2019  8:58 AM EDT ----- Pt needs an OV for the [facility] referral  thanks

## 2019-08-28 NOTE — Progress Notes (Signed)
Virtual Visit via Video Note  I connected with Henry Franklin. on 08/29/19 at  1:30 PM EDT by a video enabled telemedicine application and verified that I am speaking with the correct person using two identifiers.   I discussed the limitations of evaluation and management by telemedicine and the availability of in person appointments. The patient expressed understanding and agreed to proceed.  Present for the visit:  Myself, Dr [name], Henry Franklin and his wife Henry Franklin.  The patient is currently at home and I am in the office.    No referring provider.    History of Present Illness: This is an acute visit for request for home physical therapy, occupational therapy and speech therapy for cognitive work.  His wife Henry Franklin helps provide some of the history because of his memory issues.  He has poor balance, physical deconditioning, h/o CVA and some memory difficulties.  He has fallen most recently fell 2 days ago.  He did not injure himself.  He has a walker, but does not always use it consistently.  He states his legs are weak.  He also has memory issues and his wife feels that speech therapy would be beneficial as well to help with some of those issues as well.  He otherwise feels well and has no concerns.  His wife states that he tends to snack and does not always eat his meals and is not getting as well balanced diet as he should.  He does have some hearing issues and is resisting getting a hearing aid.  He does have an appointment at the [facility] and she can discuss that with them.   Review of Systems  Constitutional: Negative for chills and fever.  Respiratory: Negative for cough, shortness of breath and wheezing.   Cardiovascular: Negative for chest pain, palpitations and leg swelling.  Gastrointestinal: Negative for abdominal pain.  Musculoskeletal: Positive for back pain (intermittent).       Leg weakness, poor balance  Neurological: Negative for dizziness and headaches.      Social  History   Socioeconomic History  . Marital status: Married    Spouse name: Not on file  . Number of children: 2  . Years of education: Not on file  . Highest education level: Not on file  Occupational History  . Occupation: retired  Tobacco Use  . Smoking status: Never Smoker  . Smokeless tobacco: Never Used  Substance and Sexual Activity  . Alcohol use: No    Alcohol/week: 0.0 standard drinks  . Drug use: No  . Sexual activity: Not Currently  Other Topics Concern  . Not on file  Social History Narrative  . Not on file   Social Determinants of Health   Financial Resource Strain:   . Difficulty of Paying Living Expenses:   Food Insecurity:   . Worried About Charity fundraiser in the Last Year:   . Arboriculturist in the Last Year:   Transportation Needs:   . Film/video editor (Medical):   [name] Lack of Transportation (Non-Medical):   Physical Activity:   . Days of Exercise per Week:   . Minutes of Exercise per Session:   Stress:   . Feeling of Stress :   Social Connections:   . Frequency of Communication with Friends and Family:   . Frequency of Social Gatherings with Friends and Family:   . Attends Religious Services:   . Active Member of Clubs or Organizations:   . Attends  Club or Organization Meetings:   [name] Marital Status:      Observations/Objective: Appears well in no acute distress Breathing normally, speaking in full sentences Does have some difficulty hearing Skin appears warm and dry Mood and affect are normal   Assessment and Plan:  Physical deconditioning, poor balance, falls, history of CVA, dementia: Medically stable Has had recurrent falls secondary to physical deconditioning and poor balance.  He is using a walker and instructed he needs to use this consistently, which he does not always do Would benefit from physical therapy and Occupational Therapy, which I have ordered Has dementia, which is stable and would benefit from speech  therapy/cognitive work which I have also ordered   See Problem List for Assessment and Plan of chronic medical problems.   Follow Up Instructions:    I discussed the assessment and treatment plan with the patient. The patient was provided an opportunity to ask questions and all were answered. The patient agreed with the plan and demonstrated an understanding of the instructions.   The patient was advised to call back or seek an in-person evaluation if the symptoms worsen or if the condition fails to improve as anticipated.    [name], MD

## 2019-08-29 ENCOUNTER — Ambulatory Visit (INDEPENDENT_AMBULATORY_CARE_PROVIDER_SITE_OTHER): Payer: Medicare HMO | Admitting: Internal Medicine

## 2019-08-29 ENCOUNTER — Encounter: Payer: Self-pay | Admitting: Internal Medicine

## 2019-08-29 ENCOUNTER — Other Ambulatory Visit: Payer: Self-pay | Admitting: *Deleted

## 2019-08-29 DIAGNOSIS — R2689 Other abnormalities of gait and mobility: Secondary | ICD-10-CM | POA: Diagnosis not present

## 2019-08-29 DIAGNOSIS — Z8673 Personal history of transient ischemic attack (TIA), and cerebral infarction without residual deficits: Secondary | ICD-10-CM

## 2019-08-29 DIAGNOSIS — R413 Other amnesia: Secondary | ICD-10-CM | POA: Diagnosis not present

## 2019-08-29 DIAGNOSIS — R5381 Other malaise: Secondary | ICD-10-CM | POA: Diagnosis not present

## 2019-08-29 NOTE — Patient Outreach (Signed)
[place] [facility]) Care Management  08/29/2019  Henry Franklin. 19-Jan-1927 [id number]   CSW received a call from pt's wife- inquiring about her anticipated "virtual call" with Dr. Quay Burow.  CSW advised wife that she should be receiving a call from PCP office for this call/appointment.  CSW suggested the call is pending and asked her to callback if no call is received by 2pm today.  CSW inquired about [facility] services; she reports she has not had a call yet from [facility] agency.  Will attempt to inquire with PCP office about which [facility] agency it was arranged with.   Per wife; "he fell over the weekend and did not get hurt".  CSW offered support to wife and will await updates.   Eduard Clos, MSW, [place] Worker  [place] [phone]

## 2019-08-30 ENCOUNTER — Other Ambulatory Visit: Payer: Self-pay | Admitting: *Deleted

## 2019-08-30 ENCOUNTER — Ambulatory Visit: Payer: Self-pay | Admitting: *Deleted

## 2019-08-30 NOTE — Patient Outreach (Signed)
[facility] [facility]) Care Management  08/30/2019  Henry Franklin 1926/06/09 [id number]   CSW received a call from pt's wife who reports "[facility] has been set up".  CSW advised her to expect a phone call from them any day this week to begin services.  She reports pt had a good day today; "he walked outside this morning and again this afternoon".  She seems more at ease today.  CSW offered support and offered to check in later this week for further updates.   Eduard Clos, MSW, [place] Worker  [place] [phone]

## 2019-08-31 ENCOUNTER — Ambulatory Visit: Payer: Self-pay | Admitting: *Deleted

## 2019-09-01 ENCOUNTER — Other Ambulatory Visit: Payer: Self-pay | Admitting: *Deleted

## 2019-09-02 NOTE — Patient Outreach (Signed)
[place] [facility]) Care Management  09/02/2019  Henry Franklin 1926-08-28 [id number]   CSW spoke with pt's wife who advised they had not heard from [facility] agency.  CSW contacted [facility] per PCP office referral made earlier this week and was able to connect the dots to get the referral moving forward.  CSW stressed to the [facility] rep pt has had some falls and is weak and they are hopeful to get insurance approval for services to begin ASAP.  CSW updated pt's wife by voicemail and will touch base again next week for confirmation things are resolved and therapies are beginning.    Eduard Clos, MSW, [place] Worker  [place] [phone]

## 2019-09-05 ENCOUNTER — Other Ambulatory Visit: Payer: Self-pay | Admitting: *Deleted

## 2019-09-05 NOTE — Patient Outreach (Signed)
[facility]) Care Management  09/05/2019  Henry Franklin 1926/12/27 [id number]   CSW spoke with [facility] rep who reports he is working with pt's [employer] for approval of [place] services.  CSW awaits update on this in hopes the PT can start this week.   Will update as progress is made.   Eduard Clos, MSW, [place] Worker  [place] [phone]

## 2019-09-06 ENCOUNTER — Other Ambulatory Visit: Payer: Self-pay | Admitting: *Deleted

## 2019-09-06 NOTE — Patient Outreach (Signed)
[facility]) Care Management  09/06/2019  Henry Franklin 09/07/1926 [id number]   [place] spoke with [facility] rep who indicates they are still awaiting communication from [place] for approval.  With the uncertainty of this and pt's need for services (began working on this order 3/23) CSW has spoken with Josie Dixon, [facility] rep, who is able to staff and seek insurance auth for services to begin this week.   CSW will update PCP office as well as pt's wife- awaiting callback from wife.    [name] has also indicated pt has some hearing issues.  CSW will ask PCP to consult a local provider to assess for hearing loss/needs.  CSW will follow up in 7-10 days for updates on progress and needs.   Eduard Clos, MSW, [place] Worker  [place] [phone]

## 2019-09-14 ENCOUNTER — Telehealth: Payer: Self-pay | Admitting: Cardiovascular Disease

## 2019-09-14 ENCOUNTER — Other Ambulatory Visit: Payer: Self-pay

## 2019-09-14 ENCOUNTER — Encounter: Payer: Self-pay | Admitting: Internal Medicine

## 2019-09-14 ENCOUNTER — Ambulatory Visit (INDEPENDENT_AMBULATORY_CARE_PROVIDER_SITE_OTHER): Payer: Medicare HMO | Admitting: Internal Medicine

## 2019-09-14 VITALS — BP 148/72 | HR 64 | Temp 98.0°F | Resp 14 | Ht 70.0 in | Wt 142.0 lb

## 2019-09-14 DIAGNOSIS — H6123 Impacted cerumen, bilateral: Secondary | ICD-10-CM | POA: Insufficient documentation

## 2019-09-14 DIAGNOSIS — H9190 Unspecified hearing loss, unspecified ear: Secondary | ICD-10-CM | POA: Insufficient documentation

## 2019-09-14 DIAGNOSIS — H900 Conductive hearing loss, bilateral: Secondary | ICD-10-CM | POA: Diagnosis not present

## 2019-09-14 NOTE — Assessment & Plan Note (Signed)
Acute on chronic He does have chronic hearing loss and is being evaluated for hearing aids, but sustained acute hearing loss 2 days ago secondary to bilateral impacted cerumen Attempts at home to remove cerumen with earwax removal drops was not successful He is not able to hear currently Enough earwax was removed with both ears to improve hearing

## 2019-09-14 NOTE — Telephone Encounter (Signed)
Patient's wife states she will need to come with patient to his appt on 09/19/19 with Dr. Angelena Form. She states that he is hard of hearing and has memory loss. She states it can be difficult to communicate with him.

## 2019-09-14 NOTE — Telephone Encounter (Signed)
Spoke with Mrs. Crase who reports that her husband has memory impairment as well as hearing difficulties so she will need to attend his appointment with him. I let her know I would make a note and that it would be ok.

## 2019-09-14 NOTE — Patient Instructions (Signed)
Your ears were cleaned out here today.  Continue to use the ear drops at home.    Please call if there is no improvement in your symptoms.

## 2019-09-14 NOTE — Progress Notes (Signed)
Patient consent obtained. Irrigation with water and peroxide performed. Full view of tympanic membranes after procedure.  Patient tolerated procedure well.   Left ear-unsuccessful

## 2019-09-14 NOTE — Progress Notes (Signed)
Subjective:    Patient ID: Henry Cotta., male    DOB: 1926/07/07, 86 y.o.   MRN: [id number]  HPI The patient is here for an acute visit.   Decreased hearing: He has not been able to hear anything since Monday.  His wife thought this could be related to excessive earwax, which she has had in the past.  She started using over-the-counter earwax removal drops 3 days ago and it has not helped and may have made things worse.  Last night he did not even want her to use the drops and she was not sure what to do.    He has not mentioned any pain or had any drainage.  There have been no fevers or cold symptoms.      Medications and allergies reviewed with patient and updated if appropriate.  Patient Active Problem List   Diagnosis Date Noted  . Memory difficulties 08/24/2019  . Frequent falls 08/11/2018  . Poor balance 08/11/2018  . Physical deconditioning 08/11/2018  . Neck pain 02/24/2017  . Cervical paraspinal muscle spasm 12/23/2016  . CAD (coronary artery disease) 12/03/2015  . Atrial fibrillation ([place]) 11/21/2015  . STEMI (ST elevation myocardial infarction) ([place]) 11/19/2015  . History of CVA (cerebrovascular accident) 05/31/2014  . Pulmonary embolism ([place]) 05/20/2014  . HTN (hypertension) 05/20/2014  . Prostate cancer ([place]) 04/05/2014  . Spinal stenosis of lumbar region 11/15/2013  . Falls 05/10/2013  . Hyperlipidemia   . Benign essential tremor   . Lumbago   . Palpitations   . Ingrown toenail without infection 09/07/2012    Current Outpatient Medications on File Prior to Visit  Medication Sig Dispense Refill  . acetaminophen (TYLENOL) 325 MG tablet Take 2 tablets (650 mg total) by mouth every 6 (six) hours as needed for mild pain, moderate pain or fever. 60 tablet 0  . apixaban (ELIQUIS) 5 MG TABS tablet Take 1 tablet (5 mg total) by mouth 2 (two) times daily. 180 tablet 3  . aspirin EC 81 MG tablet Take 1 tablet (81 mg total) by mouth daily. 90 tablet 3  .  atorvastatin (LIPITOR) 40 MG tablet Take 1 tablet (40 mg total) by mouth daily. Please keep upcoming appt in April with Dr. Angelena Form before anymore refills. Thank you 90 tablet 0  . Cholecalciferol (VITAMIN D-3) 1000 UNITS CAPS Take 1 capsule by mouth daily. Take one tablet by mouth once daily.    [name] docusate sodium (COLACE) 100 MG capsule Take 100 mg by mouth daily.    [name] lisinopril (ZESTRIL) 2.5 MG tablet Take 1 tablet (2.5 mg total) by mouth daily. Please keep upcoming appt in April with Dr. Angelena Form before anymore refills. Thank you 90 tablet 0  . Multiple Vitamin (MULTIVITAMIN) tablet Take 1 tablet by mouth daily.     No current facility-administered medications on file prior to visit.    Past Medical History:  Diagnosis Date  . CAD (coronary artery disease)    a. STEMI 6/17: LHC - oLAD 10, mLAD 50, dLAD 20, D2 inf subbranch 70 + sup subbranch 100, pRCA 20, oRPDA 40, EF 35-45% >> PCI:  2.25 x 12 mm resolute integrity DES to the superior subbranch of D2   . Essential and other specified forms of tremor   . External hemorrhoids without mention of complication   . History of ST elevation myocardial infarction (STEMI)    a. Ant-Lat STEMI >> DES to D2 subbranch  . History of stroke   . History  of TIA (transient ischemic attack)   . HTN (hypertension)   . Hyperlipidemia   . Ischemic cardiomyopathy    a. Echo 6/17: Mid and distal anterior/inferior, septal and apical HK, mild concentric LVH, EF 30-35%, mild LAE  //  b. Echo 9/17 mild focal basal septal hypertrophy, EF 55-60%, normal wall motion, grade 1 diastolic dysfunction, mildly dilated aortic root (37 mm), normal RVSF, PASP 29 mmHg  . Lumbago   . Malignant neoplasm of prostate ([place])   . Nontraumatic rupture of tendons of biceps (long head)   . Paroxysmal atrial fibrillation (HCC)    during admit for STEMI 6/17 >> triple Rx high risk >> ASA + Plavix x 30 days post MI, then Eliquis + Plavix  . Sebaceous cyst   . Supraventricular premature  beats   . Unspecified anomaly of tooth position     Past Surgical History:  Procedure Laterality Date  . APPENDECTOMY  1942  . BACK SURGERY     with sciatica  . CARDIAC CATHETERIZATION N/A 11/19/2015   Procedure: Left Heart Cath and Coronary Angiography;  Surgeon: Burnell Blanks, MD;  Location: [place] [facility];  Service: Cardiovascular;  Laterality: N/A;  . CARDIAC CATHETERIZATION N/A 11/19/2015   Procedure: Coronary Stent Intervention;  Surgeon: Burnell Blanks, MD;  Location: [place] CV [facility];  Service: Cardiovascular;  Laterality: N/A;  . prostate cancer    . SPINE SURGERY N/A 2000   [name]    Social History   Socioeconomic History  . Marital status: Married    Spouse name: Not on file  . Number of children: 2  . Years of education: Not on file  . Highest education level: Not on file  Occupational History  . Occupation: retired  Tobacco Use  . Smoking status: Never Smoker  . Smokeless tobacco: Never Used  Substance and Sexual Activity  . Alcohol use: No    Alcohol/week: 0.0 standard drinks  . Drug use: No  . Sexual activity: Not Currently  Other Topics Concern  . Not on file  Social History Narrative  . Not on file   Social Determinants of Health   Financial Resource Strain:   . Difficulty of Paying Living Expenses:   Food Insecurity:   . Worried About Charity fundraiser in the Last Year:   . Arboriculturist in the Last Year:   Transportation Needs:   . Film/video editor (Medical):   [name] Lack of Transportation (Non-Medical):   Physical Activity:   . Days of Exercise per Week:   . Minutes of Exercise per Session:   Stress:   . Feeling of Stress :   Social Connections:   . Frequency of Communication with Friends and Family:   . Frequency of Social Gatherings with Friends and Family:   . Attends Religious Services:   . Active Member of Clubs or Organizations:   . Attends Archivist Meetings:   [name] Marital Status:     Family  History  Problem Relation Age of Onset  . Cancer Father 70       brain tumor    Review of Systems  Constitutional: Negative for fever.  HENT: Positive for hearing loss. Negative for ear discharge and ear pain.        Objective:   Vitals:   09/14/19 1425  BP: (!) 148/72  Pulse: 64  Resp: 14  Temp: 98 F (36.7 C)  SpO2: 97%   BP Readings from Last 3  Encounters:  09/14/19 (!) 148/72  08/11/18 112/64  07/20/18 (!) 148/68   Wt Readings from Last 3 Encounters:  09/14/19 142 lb (64.4 kg)  08/11/18 147 lb (66.7 kg)  07/20/18 145 lb 12.8 oz (66.1 kg)   Body mass index is 20.37 kg/m.   Physical Exam Constitutional:      General: He is not in acute distress.    Appearance: Normal appearance. He is not ill-appearing.  HENT:     Head: Normocephalic and atraumatic.     Right Ear: There is impacted cerumen.     Left Ear: There is impacted cerumen.  Skin:    General: Skin is warm and dry.  Neurological:     Mental Status: He is alert.       PRE-PROCEDURE EXAM: Bilateral ear canals impacted with cerumen resulting in total impaction of the ear canal. PROCEDURE INDICATION: remove wax to visualize ear drum & improve hearing CONSENT:  Verbal  PROCEDURE NOTE:   See separate CMA procedure note.   LEFT EAR:  The CMA irrigated with warm water but the wax was dense and then it was be able to be removed.  I used a plastic lighted curette and under direct visualization with an otoscope removed 90% of the impacted wax. POST- PROCEDURE EXAM: TMs partially visualized and found to have no erythema.  Ear canal without erythema.  Hearing improved.  LEFT EAR:   I used a plastic lighted curette and under direct visualization with an otoscope removed 70% of the impacted wax. POST- PROCEDURE EXAM: TMs not visualized.  Hearing improved.  No erythema in ear canal     Assessment & Plan:    See Problem List for Assessment and Plan of chronic medical problems.    This visit occurred  during the SARS-CoV-2 public health emergency.  Safety protocols were in place, including screening questions prior to the visit, additional usage of staff PPE, and extensive cleaning of exam room while observing appropriate contact time as indicated for disinfecting solutions.

## 2019-09-14 NOTE — Assessment & Plan Note (Signed)
Acute Had significant hearing loss related to bilateral impacted cerumen Hearing loss improved after lavage/removal of wax with curette and left ear and removal of wax with curette and right ear Not all of the wax was removed, but enough was removed to improve hearing Wife will continue to use over-the-counter earwax removal drops to remove the rest of the wax

## 2019-09-15 ENCOUNTER — Other Ambulatory Visit: Payer: Self-pay | Admitting: *Deleted

## 2019-09-15 NOTE — Patient Outreach (Signed)
[facility]) Care Management  09/15/2019  Henry Franklin July 18, 1926 [id number]   CSW spoke  with pt's wife as well as [facility] rep regarding [facility] services.  Unfortunately, the home health has not been able to be initiated but they are expecting "Dianah Field, the physical therapist" to come tomorrow. [name] is aware of this plan.  Per wife, pt was seen by PCP in the office on yesterday and "they worked on his clogged ears a long time".   Wife denies any other concerns or needs at this time. Wife agrees to update CSW if no [facility] services arrive.   CSW will plan follow up call next week as well.     Eduard Clos, MSW, [place] Worker  [place] [phone]

## 2019-09-16 ENCOUNTER — Telehealth: Payer: Self-pay

## 2019-09-16 DIAGNOSIS — H6123 Impacted cerumen, bilateral: Secondary | ICD-10-CM

## 2019-09-16 DIAGNOSIS — H9193 Unspecified hearing loss, bilateral: Secondary | ICD-10-CM

## 2019-09-16 NOTE — Telephone Encounter (Signed)
New message    The wife is asking for a referral to see an ENT.

## 2019-09-19 ENCOUNTER — Ambulatory Visit: Payer: Medicare HMO | Admitting: Cardiovascular Disease

## 2019-09-19 ENCOUNTER — Other Ambulatory Visit: Payer: Self-pay

## 2019-09-19 ENCOUNTER — Encounter: Payer: Self-pay | Admitting: Cardiovascular Disease

## 2019-09-19 ENCOUNTER — Telehealth: Payer: Self-pay

## 2019-09-19 VITALS — BP 142/76 | HR 51 | Ht 70.0 in | Wt 141.8 lb

## 2019-09-19 DIAGNOSIS — E782 Mixed hyperlipidemia: Secondary | ICD-10-CM | POA: Diagnosis not present

## 2019-09-19 DIAGNOSIS — I10 Essential (primary) hypertension: Secondary | ICD-10-CM

## 2019-09-19 DIAGNOSIS — I25118 Atherosclerotic heart disease of native coronary artery with other forms of angina pectoris: Secondary | ICD-10-CM

## 2019-09-19 DIAGNOSIS — I480 Paroxysmal atrial fibrillation: Secondary | ICD-10-CM | POA: Diagnosis not present

## 2019-09-19 MED ORDER — NITROGLYCERIN 0.4 MG SL SUBL: 0.4000 mg | SUBLINGUAL_TABLET | SUBLINGUAL | 3 refills | Status: AC | PRN

## 2019-09-19 NOTE — Telephone Encounter (Signed)
Spoke with pt's wife and gave her referral update along with phone # to ENT office

## 2019-09-19 NOTE — Patient Instructions (Signed)
Medication Instructions:  No changes ([facility])  *If you need a refill on your cardiac medications before your next appointment, please call your pharmacy*   Lab Work: Today: lipids/liver function If you have labs (blood work) drawn today and your tests are completely normal, you will receive your results only by: [name] MyChart Message (if you have MyChart) OR . A paper copy in the mail If you have any lab test that is abnormal or we need to change your treatment, we will call you to review the results.   Testing/Procedures: none   Follow-Up: At [facility], you and your health needs are our priority.  As part of our continuing mission to provide you with exceptional heart care, we have created designated Provider Care Teams.  These Care Teams include your primary Cardiologist (physician) and Advanced Practice Providers (APPs -  Physician Assistants and Nurse Practitioners) who all work together to provide you with the care you need, when you need it.  Your next appointment:   12 month(s)  The format for your next appointment:   In Person  Provider:   You may see [name], MD  or one of the following Advanced Practice Providers on your designated Care Team:    Melina Copa, PA-C  Ermalinda Barrios, PA-C   Other Instructions

## 2019-09-19 NOTE — Telephone Encounter (Signed)
New message    The wife calling to check on the status of ENT referral.

## 2019-09-19 NOTE — Progress Notes (Signed)
Chief Complaint  Patient presents with  . Follow-up    CAD    History of Present Illness: 86 yo male with history of CAD, HTN, HLD, prior CVA/TIA, prostate cancer and pulmonary embolism who is here today for cardiac follow up. He was admitted to [facility] in June 2017 with an acute anterolateral STEMI. Cardiac cath June 2017 with moderate LAD stenosis and occluded sub-branch of the Diagonal branch which was felt to be the culprit lesion. The Diagonal branch was treated with a drug eluting stent.  Following his MI, he developed atrial fibrillation and was placed on amiodarone and a beta blocker but both were stopped due to bradycardia. Echo June 2017 following his MI with LvEF=30-35% but repeat echo in September 2017 showed normal LV function. Eliquis was started given his PAF.  Nuclear stress test in January 2019 without evidence of ischemia. He had chest pain in February 2020 and Imdur was added.   He is here today for follow up. The patient denies any dyspnea, palpitations, lower extremity edema, orthopnea, PND, dizziness, near syncope or syncope.Occasional mild dull chest pain. .   Primary Care Physician: [name], MD  Past Medical History:  Diagnosis Date  . CAD (coronary artery disease)    a. STEMI 6/17: LHC - oLAD 10, mLAD 50, dLAD 20, D2 inf subbranch 70 + sup subbranch 100, pRCA 20, oRPDA 40, EF 35-45% >> PCI:  2.25 x 12 mm resolute integrity DES to the superior subbranch of D2   . Essential and other specified forms of tremor   . External hemorrhoids without mention of complication   . History of ST elevation myocardial infarction (STEMI)    a. Ant-Lat STEMI >> DES to D2 subbranch  . History of stroke   . History of TIA (transient ischemic attack)   . HTN (hypertension)   . Hyperlipidemia   . Ischemic cardiomyopathy    a. Echo 6/17: Mid and distal anterior/inferior, septal and apical HK, mild concentric LVH, EF 30-35%, mild LAE  //  b. Echo 9/17 mild focal basal septal  hypertrophy, EF 55-60%, normal wall motion, grade 1 diastolic dysfunction, mildly dilated aortic root (37 mm), normal RVSF, PASP 29 mmHg  . Lumbago   . Malignant neoplasm of prostate ([place])   . Nontraumatic rupture of tendons of biceps (long head)   . Paroxysmal atrial fibrillation (HCC)    during admit for STEMI 6/17 >> triple Rx high risk >> ASA + Plavix x 30 days post MI, then Eliquis + Plavix  . Sebaceous cyst   . Supraventricular premature beats   . Unspecified anomaly of tooth position     Past Surgical History:  Procedure Laterality Date  . APPENDECTOMY  1942  . BACK SURGERY     with sciatica  . CARDIAC CATHETERIZATION N/A 11/19/2015   Procedure: Left Heart Cath and Coronary Angiography;  Surgeon: Burnell Blanks, MD;  Location: [place] [facility];  Service: Cardiovascular;  Laterality: N/A;  . CARDIAC CATHETERIZATION N/A 11/19/2015   Procedure: Coronary Stent Intervention;  Surgeon: Burnell Blanks, MD;  Location: [place] [facility];  Service: Cardiovascular;  Laterality: N/A;  . prostate cancer    . SPINE SURGERY N/A 2000   [name]    Current Outpatient Medications  Medication Sig Dispense Refill  . acetaminophen (TYLENOL) 325 MG tablet Take 2 tablets (650 mg total) by mouth every 6 (six) hours as needed for mild pain, moderate pain or fever. 60 tablet 0  . apixaban (  ELIQUIS) 5 MG TABS tablet Take 1 tablet (5 mg total) by mouth 2 (two) times daily. 180 tablet 3  . aspirin EC 81 MG tablet Take 1 tablet (81 mg total) by mouth daily. 90 tablet 3  . atorvastatin (LIPITOR) 40 MG tablet Take 1 tablet (40 mg total) by mouth daily. Please keep upcoming appt in April with Dr. Angelena Form before anymore refills. Thank you 90 tablet 0  . Cholecalciferol (VITAMIN D-3) 1000 UNITS CAPS Take 1 capsule by mouth daily. Take one tablet by mouth once daily.    [name] docusate sodium (COLACE) 100 MG capsule Take 100 mg by mouth daily.    [name] lisinopril (ZESTRIL) 2.5 MG tablet Take 1 tablet (2.5  mg total) by mouth daily. Please keep upcoming appt in April with Dr. Angelena Form before anymore refills. Thank you 90 tablet 0  . Multiple Vitamin (MULTIVITAMIN) tablet Take 1 tablet by mouth daily.     No current facility-administered medications for this visit.    No Known Allergies  Social History   Socioeconomic History  . Marital status: Married    Spouse name: Not on file  . Number of children: 2  . Years of education: Not on file  . Highest education level: Not on file  Occupational History  . Occupation: retired  Tobacco Use  . Smoking status: Never Smoker  . Smokeless tobacco: Never Used  Substance and Sexual Activity  . Alcohol use: No    Alcohol/week: 0.0 standard drinks  . Drug use: No  . Sexual activity: Not Currently  Other Topics Concern  . Not on file  Social History Narrative  . Not on file   Social Determinants of Health   Financial Resource Strain:   . Difficulty of Paying Living Expenses:   Food Insecurity:   . Worried About Charity fundraiser in the Last Year:   . Arboriculturist in the Last Year:   Transportation Needs:   . Film/video editor (Medical):   [name] Lack of Transportation (Non-Medical):   Physical Activity:   . Days of Exercise per Week:   . Minutes of Exercise per Session:   Stress:   . Feeling of Stress :   Social Connections:   . Frequency of Communication with Friends and Family:   . Frequency of Social Gatherings with Friends and Family:   . Attends Religious Services:   . Active Member of Clubs or Organizations:   . Attends Archivist Meetings:   [name] Marital Status:   Intimate Partner Violence:   . Fear of Current or Ex-Partner:   . Emotionally Abused:   [name] Physically Abused:   . Sexually Abused:     Family History  Problem Relation Age of Onset  . Cancer Father 17       brain tumor    Review of Systems:  As stated in the HPI and otherwise negative.   BP (!) 142/76   Pulse (!) 51   Ht 5\' 10"  (1.778 m)    Wt 141 lb 12.8 oz (64.3 kg)   SpO2 95%   BMI 20.35 kg/m   Physical Examination:  General: Well developed, well nourished, NAD  HEENT: OP clear, mucus membranes moist  SKIN: warm, dry. No rashes. Neuro: No focal deficits  Musculoskeletal: Muscle strength 5/5 all ext  Psychiatric: Mood and affect normal  Neck: No JVD, no carotid bruits, no thyromegaly, no lymphadenopathy.  Lungs:Clear bilaterally, no wheezes, rhonci, crackles Cardiovascular: Regular rate and rhythm.  No murmurs, gallops or rubs. Abdomen:Soft. Bowel sounds present. Non-tender.  Extremities: No lower extremity edema. Pulses are 2 + in the bilateral DP/PT.  EKG:  EKG is ordered today. The ekg ordered today demonstrates Sinus with PACs.   Recent Labs: No results found for requested labs within last 8760 hours.   Lipid Panel    Component Value Date/Time   CHOL 120 08/18/2017 0732   TRIG 65 08/18/2017 0732   HDL 48 08/18/2017 0732   CHOLHDL 2.5 08/18/2017 0732   CHOLHDL 2.6 01/14/2016 0736   VLDL 15 01/14/2016 0736   LDLCALC 59 08/18/2017 0732     Wt Readings from Last 3 Encounters:  09/19/19 141 lb 12.8 oz (64.3 kg)  09/14/19 142 lb (64.4 kg)  08/11/18 147 lb (66.7 kg)     Other studies Reviewed: Additional studies/ records that were reviewed today include: .Hospital records, cath films, echo images. Office visits.  Review of the above records demonstrates:    Assessment and Plan:   1. CAD without angina: He has no chest pain. He has not been on a beta blocker due to bradycardia. Continue ASA and statin. .   2. Paroxysmal atrial fibrillation: Sinus today. Continue Eliquis.    3. HTN: BP is controlled. Continue current therapy.   4. HLD: LDL at goal in 2019. LDL 59. Continue statin.    Current medicines are reviewed at length with the patient today.  The patient does not have concerns regarding medicines.  The following changes have been made:  Plavix stopped. ASA added.   Labs/ tests ordered  today include:   No orders of the defined types were placed in this encounter.    Disposition:   FU with me in 12 months.    Signed, [name], MD 09/19/2019 4:29 PM    [address], [place], [place]  [address] Phone: [phone]; Fax: [phone]

## 2019-09-20 ENCOUNTER — Other Ambulatory Visit: Payer: Self-pay | Admitting: *Deleted

## 2019-09-20 LAB — HEPATIC FUNCTION PANEL
ALT: 23 IU/L (ref 0–44)
AST: 27 IU/L (ref 0–40)
Albumin: 3.7 g/dL (ref 3.5–4.6)
Alkaline Phosphatase: 77 IU/L (ref 39–117)
Bilirubin Total: 0.3 mg/dL (ref 0.0–1.2)
Bilirubin, Direct: 0.13 mg/dL (ref 0.00–0.40)
Total Protein: 5.7 g/dL — ABNORMAL LOW (ref 6.0–8.5)

## 2019-09-20 LAB — LIPID PANEL
Chol/HDL Ratio: 2.5 ratio (ref 0.0–5.0)
Cholesterol, Total: 114 mg/dL (ref 100–199)
HDL: 46 mg/dL (ref >39)
LDL Chol Calc (NIH): 53 mg/dL (ref 0–99)
Triglycerides: 75 mg/dL (ref 0–149)
VLDL Cholesterol Cal: 15 mg/dL (ref 5–40)

## 2019-09-20 NOTE — Patient Outreach (Signed)
[place] [facility]) Care Management  09/20/2019  Henry Franklin August 02, 1926 [id number]   CSW received a voice message from pt;s wife indicating the [place] visit planned for last Friday was declined by pt because the PT had not received her COVID vaccinations. Per wife, pt is not comfortable with that. Wife also reports plans to get pt into see an ENT asap for his clogged ears- she feels this is a barrier to his ability to communicate and progress with the P.  CSW has communicated with [facility] rep who is also aware and understands and will await word from pt/wife or CSW that they are "ready" for the services.   Wife asked that CSW plan to touch base in the about 2 weeks.   Eduard Clos, MSW, [place] Worker  [place] [phone]

## 2019-09-22 ENCOUNTER — Ambulatory Visit (INDEPENDENT_AMBULATORY_CARE_PROVIDER_SITE_OTHER): Payer: Medicare HMO | Admitting: Otolaryngology

## 2019-09-22 ENCOUNTER — Encounter (INDEPENDENT_AMBULATORY_CARE_PROVIDER_SITE_OTHER): Payer: Self-pay | Admitting: Otolaryngology

## 2019-09-22 ENCOUNTER — Other Ambulatory Visit: Payer: Self-pay

## 2019-09-22 VITALS — Temp 97.9°F

## 2019-09-22 DIAGNOSIS — H6123 Impacted cerumen, bilateral: Secondary | ICD-10-CM | POA: Diagnosis not present

## 2019-09-22 DIAGNOSIS — H903 Sensorineural hearing loss, bilateral: Secondary | ICD-10-CM

## 2019-09-22 NOTE — Progress Notes (Signed)
HPI: Henry Dabdoub. is a 86 y.o. male who presents is referred by Dr. Quay Burow for evaluation of wax buildup in his ears.  He presents with his wife who did not want a hearing test today by apparently has a hearing test scheduled somewhere else once the wax is cleaned.  He has not had his ears cleaned in several years.  They have been using Debrox eardrops..  Past Medical History:  Diagnosis Date  . CAD (coronary artery disease)    a. STEMI 6/17: LHC - oLAD 10, mLAD 50, dLAD 20, D2 inf subbranch 70 + sup subbranch 100, pRCA 20, oRPDA 40, EF 35-45% >> PCI:  2.25 x 12 mm resolute integrity DES to the superior subbranch of D2   . Essential and other specified forms of tremor   . External hemorrhoids without mention of complication   . History of ST elevation myocardial infarction (STEMI)    a. Ant-Lat STEMI >> DES to D2 subbranch  . History of stroke   . History of TIA (transient ischemic attack)   . HTN (hypertension)   . Hyperlipidemia   . Ischemic cardiomyopathy    a. Echo 6/17: Mid and distal anterior/inferior, septal and apical HK, mild concentric LVH, EF 30-35%, mild LAE  //  b. Echo 9/17 mild focal basal septal hypertrophy, EF 55-60%, normal wall motion, grade 1 diastolic dysfunction, mildly dilated aortic root (37 mm), normal RVSF, PASP 29 mmHg  . Lumbago   . Malignant neoplasm of prostate ([place])   . Nontraumatic rupture of tendons of biceps (long head)   . Paroxysmal atrial fibrillation (HCC)    during admit for STEMI 6/17 >> triple Rx high risk >> ASA + Plavix x 30 days post MI, then Eliquis + Plavix  . Sebaceous cyst   . Supraventricular premature beats   . Unspecified anomaly of tooth position    Past Surgical History:  Procedure Laterality Date  . APPENDECTOMY  1942  . BACK SURGERY     with sciatica  . CARDIAC CATHETERIZATION N/A 11/19/2015   Procedure: Left Heart Cath and Coronary Angiography;  Surgeon: Burnell Blanks, MD;  Location: [place] [facility];  Service:  Cardiovascular;  Laterality: N/A;  . CARDIAC CATHETERIZATION N/A 11/19/2015   Procedure: Coronary Stent Intervention;  Surgeon: Burnell Blanks, MD;  Location: [facility];  Service: Cardiovascular;  Laterality: N/A;  . prostate cancer    . SPINE SURGERY N/A 2000   [name]   Social History   Socioeconomic History  . Marital status: Married    Spouse name: Not on file  . Number of children: 2  . Years of education: Not on file  . Highest education level: Not on file  Occupational History  . Occupation: retired  Tobacco Use  . Smoking status: Never Smoker  . Smokeless tobacco: Never Used  Substance and Sexual Activity  . Alcohol use: No    Alcohol/week: 0.0 standard drinks  . Drug use: No  . Sexual activity: Not Currently  Other Topics Concern  . Not on file  Social History Narrative  . Not on file   Social Determinants of Health   Financial Resource Strain:   . Difficulty of Paying Living Expenses:   Food Insecurity:   . Worried About Charity fundraiser in the Last Year:   . Arboriculturist in the Last Year:   Transportation Needs:   . Film/video editor (Medical):   [name] Lack of Transportation (  Non-Medical):   Physical Activity:   . Days of Exercise per Week:   . Minutes of Exercise per Session:   Stress:   . Feeling of Stress :   Social Connections:   . Frequency of Communication with Friends and Family:   . Frequency of Social Gatherings with Friends and Family:   . Attends Religious Services:   . Active Member of Clubs or Organizations:   . Attends Archivist Meetings:   [name] Marital Status:    Family History  Problem Relation Age of Onset  . Cancer Father 29       brain tumor   No Known Allergies Prior to Admission medications   Medication Sig Start Date End Date Taking? Authorizing Provider  acetaminophen (TYLENOL) 325 MG tablet Take 2 tablets (650 mg total) by mouth every 6 (six) hours as needed for mild pain, moderate pain or  fever. 06/19/16  Yes Waynetta Pean, PA-C  apixaban (ELIQUIS) 5 MG TABS tablet Take 1 tablet (5 mg total) by mouth 2 (two) times daily. 12/20/15  Yes Richardson Dopp T, PA-C  aspirin EC 81 MG tablet Take 1 tablet (81 mg total) by mouth daily. 08/13/17  Yes Burnell Blanks, MD  atorvastatin (LIPITOR) 40 MG tablet Take 1 tablet (40 mg total) by mouth daily. Please keep upcoming appt in April with Dr. Angelena Form before anymore refills. Thank you 08/24/19  Yes Burnell Blanks, MD  Cholecalciferol (VITAMIN D-3) 1000 UNITS CAPS Take 1 capsule by mouth daily. Take one tablet by mouth once daily.   Yes [provider]  docusate sodium (COLACE) 100 MG capsule Take 100 mg by mouth daily.   Yes [provider]  lisinopril (ZESTRIL) 2.5 MG tablet Take 1 tablet (2.5 mg total) by mouth daily. Please keep upcoming appt in April with Dr. Angelena Form before anymore refills. Thank you 08/23/19  Yes Weaver, Scott T, PA-C  Multiple Vitamin (MULTIVITAMIN) tablet Take 1 tablet by mouth daily.   Yes [provider]  nitroGLYCERIN (NITROSTAT) 0.4 MG SL tablet Place 1 tablet (0.4 mg total) under the tongue every 5 (five) minutes as needed for chest pain. 09/19/19  Yes Burnell Blanks, MD     Positive ROS: Otherwise negative  All other systems have been reviewed and were otherwise negative with the exception of those mentioned in the HPI and as above.  Physical Exam: Constitutional: Alert, well-appearing, no acute distress Ears: External ears without lesions or tenderness.  He has large amount of wax in both ears left side worse than right.  This was cleaned with suction curettes and forceps.  After cleaning the ear canals the TMs were clear bilaterally. Nasal: External nose without lesions. Clear nasal passages Oral: Lips and gums without lesions. Tongue and palate mucosa without lesions. Posterior oropharynx clear. Neck: No palpable adenopathy or masses Respiratory: Breathing  comfortably  Skin: No facial/neck lesions or rash noted.  Cerumen impaction removal  Date/Time: 09/22/2019 3:32 PM Performed by: Rozetta Nunnery, MD Authorized by: Rozetta Nunnery, MD   Consent:    Consent obtained:  Verbal   Consent given by:  Patient   Risks discussed:  Pain and bleeding Procedure details:    Location:  L ear and R ear   Procedure type: curette, suction and forceps   Post-procedure details:    Inspection:  TM intact and canal normal   Hearing quality:  Improved   Patient tolerance of procedure:  Tolerated well, no immediate complications Comments:  TMs are clear bilaterally    Assessment: Bilateral cerumen impactions with underlying sensorineural hearing loss.  Plan: Recommended obtaining audiogram and assessment for hearing aids.   Radene Journey, MD   CC:

## 2019-09-27 ENCOUNTER — Other Ambulatory Visit: Payer: Self-pay | Admitting: *Deleted

## 2019-09-27 NOTE — Patient Outreach (Signed)
[facility]) Care Management  09/27/2019  Henry Franklin 01-23-1927 [id number]   CSW spoke with pt's wife who reports pt was able to go to an ENT and have his ears cleaned out and it has made a positive improvement.  "I am hoping it will last" and she is also still hoping to get him to consider hearing aides.  Per wife, pt has had no further falls and does not want to pursue home health therapy right now.  CSW has advised her to reach out if their needs change.  CSW will plan to close case referral at this time and advise [facility] team and PCP.   Eduard Clos, MSW, [place] Worker  [place] [phone]

## 2019-09-30 ENCOUNTER — Telehealth: Payer: Self-pay | Admitting: Internal Medicine

## 2019-09-30 NOTE — Telephone Encounter (Signed)
Henry Franklin with [facility] called and stated that they will be starting PT, OT and Speech with the patient 10/07/2019. The wife delayed care until everyone in the facility that would be providing care for her husband was vaccinated. Henry Franklin will be out of town for the next week please call Jenny Reichmann with any questions [phone].

## 2019-09-30 NOTE — Telephone Encounter (Signed)
FYI

## 2019-09-30 NOTE — Telephone Encounter (Signed)
Ok,noted

## 2019-10-03 ENCOUNTER — Ambulatory Visit: Payer: Medicare HMO | Admitting: Internal Medicine

## 2019-10-03 NOTE — Progress Notes (Deleted)
Subjective:    Patient ID: Henry Cotta., male    DOB: 12-04-1926, 86 y.o.   MRN: [id number]  HPI The patient is here for an acute visit.   Episode of burning in chest over the weekend:    Medications and allergies reviewed with patient and updated if appropriate.  Patient Active Problem List   Diagnosis Date Noted  . Bilateral impacted cerumen 09/14/2019  . Hearing loss 09/14/2019  . Memory difficulties 08/24/2019  . Frequent falls 08/11/2018  . Poor balance 08/11/2018  . Physical deconditioning 08/11/2018  . Neck pain 02/24/2017  . Cervical paraspinal muscle spasm 12/23/2016  . CAD (coronary artery disease) 12/03/2015  . Atrial fibrillation ([place]) 11/21/2015  . STEMI (ST elevation myocardial infarction) ([place]) 11/19/2015  . History of CVA (cerebrovascular accident) 05/31/2014  . Pulmonary embolism ([place]) 05/20/2014  . HTN (hypertension) 05/20/2014  . Prostate cancer ([place]) 04/05/2014  . Spinal stenosis of lumbar region 11/15/2013  . Falls 05/10/2013  . Hyperlipidemia   . Benign essential tremor   . Lumbago   . Palpitations   . Ingrown toenail without infection 09/07/2012    Current Outpatient Medications on File Prior to Visit  Medication Sig Dispense Refill  . acetaminophen (TYLENOL) 325 MG tablet Take 2 tablets (650 mg total) by mouth every 6 (six) hours as needed for mild pain, moderate pain or fever. 60 tablet 0  . apixaban (ELIQUIS) 5 MG TABS tablet Take 1 tablet (5 mg total) by mouth 2 (two) times daily. 180 tablet 3  . aspirin EC 81 MG tablet Take 1 tablet (81 mg total) by mouth daily. 90 tablet 3  . atorvastatin (LIPITOR) 40 MG tablet Take 1 tablet (40 mg total) by mouth daily. Please keep upcoming appt in April with Dr. Angelena Form before anymore refills. Thank you 90 tablet 0  . Cholecalciferol (VITAMIN D-3) 1000 UNITS CAPS Take 1 capsule by mouth daily. Take one tablet by mouth once daily.    [name] docusate sodium (COLACE) 100 MG capsule Take 100 mg by mouth  daily.    [name] lisinopril (ZESTRIL) 2.5 MG tablet Take 1 tablet (2.5 mg total) by mouth daily. Please keep upcoming appt in April with Dr. Angelena Form before anymore refills. Thank you 90 tablet 0  . Multiple Vitamin (MULTIVITAMIN) tablet Take 1 tablet by mouth daily.    . nitroGLYCERIN (NITROSTAT) 0.4 MG SL tablet Place 1 tablet (0.4 mg total) under the tongue every 5 (five) minutes as needed for chest pain. 25 tablet 3   No current facility-administered medications on file prior to visit.    Past Medical History:  Diagnosis Date  . CAD (coronary artery disease)    a. STEMI 6/17: LHC - oLAD 10, mLAD 50, dLAD 20, D2 inf subbranch 70 + sup subbranch 100, pRCA 20, oRPDA 40, EF 35-45% >> PCI:  2.25 x 12 mm resolute integrity DES to the superior subbranch of D2   . Essential and other specified forms of tremor   . External hemorrhoids without mention of complication   . History of ST elevation myocardial infarction (STEMI)    a. Ant-Lat STEMI >> DES to D2 subbranch  . History of stroke   . History of TIA (transient ischemic attack)   . HTN (hypertension)   . Hyperlipidemia   . Ischemic cardiomyopathy    a. Echo 6/17: Mid and distal anterior/inferior, septal and apical HK, mild concentric LVH, EF 30-35%, mild LAE  //  b. Echo 9/17 mild focal basal  septal hypertrophy, EF 55-60%, normal wall motion, grade 1 diastolic dysfunction, mildly dilated aortic root (37 mm), normal RVSF, PASP 29 mmHg  . Lumbago   . Malignant neoplasm of prostate ([place])   . Nontraumatic rupture of tendons of biceps (long head)   . Paroxysmal atrial fibrillation (HCC)    during admit for STEMI 6/17 >> triple Rx high risk >> ASA + Plavix x 30 days post MI, then Eliquis + Plavix  . Sebaceous cyst   . Supraventricular premature beats   . Unspecified anomaly of tooth position     Past Surgical History:  Procedure Laterality Date  . APPENDECTOMY  1942  . BACK SURGERY     with sciatica  . CARDIAC CATHETERIZATION N/A 11/19/2015     Procedure: Left Heart Cath and Coronary Angiography;  Surgeon: Burnell Blanks, MD;  Location: [place] [facility];  Service: Cardiovascular;  Laterality: N/A;  . CARDIAC CATHETERIZATION N/A 11/19/2015   Procedure: Coronary Stent Intervention;  Surgeon: Burnell Blanks, MD;  Location: [place] [facility];  Service: Cardiovascular;  Laterality: N/A;  . prostate cancer    . SPINE SURGERY N/A 2000   [name]    Social History   Socioeconomic History  . Marital status: Married    Spouse name: Not on file  . Number of children: 2  . Years of education: Not on file  . Highest education level: Not on file  Occupational History  . Occupation: retired  Tobacco Use  . Smoking status: Never Smoker  . Smokeless tobacco: Never Used  Substance and Sexual Activity  . Alcohol use: No    Alcohol/week: 0.0 standard drinks  . Drug use: No  . Sexual activity: Not Currently  Other Topics Concern  . Not on file  Social History Narrative  . Not on file   Social Determinants of Health   Financial Resource Strain:   . Difficulty of Paying Living Expenses:   Food Insecurity:   . Worried About Charity fundraiser in the Last Year:   . Arboriculturist in the Last Year:   Transportation Needs:   . Film/video editor (Medical):   [name] Lack of Transportation (Non-Medical):   Physical Activity:   . Days of Exercise per Week:   . Minutes of Exercise per Session:   Stress:   . Feeling of Stress :   Social Connections:   . Frequency of Communication with Friends and Family:   . Frequency of Social Gatherings with Friends and Family:   . Attends Religious Services:   . Active Member of Clubs or Organizations:   . Attends Archivist Meetings:   [name] Marital Status:     Family History  Problem Relation Age of Onset  . Cancer Father 64       brain tumor    Review of Systems     Objective:  There were no vitals filed for this visit. BP Readings from Last 3 Encounters:   09/19/19 (!) 142/76  09/14/19 (!) 148/72  08/11/18 112/64   Wt Readings from Last 3 Encounters:  09/19/19 141 lb 12.8 oz (64.3 kg)  09/14/19 142 lb (64.4 kg)  08/11/18 147 lb (66.7 kg)   There is no height or weight on file to calculate BMI.   Physical Exam         Assessment & Plan:    See Problem List for Assessment and Plan of chronic medical problems.     This visit  occurred during the SARS-CoV-2 public health emergency.  Safety protocols were in place, including screening questions prior to the visit, additional usage of staff PPE, and extensive cleaning of exam room while observing appropriate contact time as indicated for disinfecting solutions.

## 2019-10-07 DIAGNOSIS — Z7901 Long term (current) use of anticoagulants: Secondary | ICD-10-CM | POA: Diagnosis not present

## 2019-10-07 DIAGNOSIS — H9193 Unspecified hearing loss, bilateral: Secondary | ICD-10-CM | POA: Diagnosis not present

## 2019-10-07 DIAGNOSIS — Z9181 History of falling: Secondary | ICD-10-CM | POA: Diagnosis not present

## 2019-10-07 DIAGNOSIS — Z8673 Personal history of transient ischemic attack (TIA), and cerebral infarction without residual deficits: Secondary | ICD-10-CM | POA: Diagnosis not present

## 2019-10-07 DIAGNOSIS — F0390 Unspecified dementia without behavioral disturbance: Secondary | ICD-10-CM | POA: Diagnosis not present

## 2019-10-10 ENCOUNTER — Telehealth: Payer: Self-pay | Admitting: Internal Medicine

## 2019-10-10 DIAGNOSIS — Z9181 History of falling: Secondary | ICD-10-CM | POA: Diagnosis not present

## 2019-10-10 DIAGNOSIS — Z7901 Long term (current) use of anticoagulants: Secondary | ICD-10-CM | POA: Diagnosis not present

## 2019-10-10 DIAGNOSIS — Z8673 Personal history of transient ischemic attack (TIA), and cerebral infarction without residual deficits: Secondary | ICD-10-CM | POA: Diagnosis not present

## 2019-10-10 DIAGNOSIS — H9193 Unspecified hearing loss, bilateral: Secondary | ICD-10-CM | POA: Diagnosis not present

## 2019-10-10 DIAGNOSIS — F0390 Unspecified dementia without behavioral disturbance: Secondary | ICD-10-CM | POA: Diagnosis not present

## 2019-10-10 NOTE — Telephone Encounter (Signed)
LVM to ok further visits.

## 2019-10-10 NOTE — Telephone Encounter (Signed)
New Message:   Shirlee Limerick is calling from [facility] requesting orders for [facility] and PT. Requesting for 1x1, 2x2, and 1x6. Please advise.

## 2019-10-10 NOTE — Telephone Encounter (Signed)
Left detailed message giving verbal orders. 

## 2019-10-10 NOTE — Telephone Encounter (Signed)
Maggie with [facility] called this morning to inform us that the patient was admitted to home health PT. And needs approval for further visits. She has already faxed over request

## 2019-10-11 ENCOUNTER — Telehealth: Payer: Self-pay | Admitting: Internal Medicine

## 2019-10-11 NOTE — Telephone Encounter (Signed)
New message:   Henry Franklin is calling from [facility] to request 1x a week for 2 weeks for OT. Please advise.

## 2019-10-11 NOTE — Telephone Encounter (Signed)
Gave ok for orders.  

## 2019-10-12 DIAGNOSIS — Z9181 History of falling: Secondary | ICD-10-CM | POA: Diagnosis not present

## 2019-10-12 DIAGNOSIS — F0390 Unspecified dementia without behavioral disturbance: Secondary | ICD-10-CM | POA: Diagnosis not present

## 2019-10-12 DIAGNOSIS — Z8673 Personal history of transient ischemic attack (TIA), and cerebral infarction without residual deficits: Secondary | ICD-10-CM | POA: Diagnosis not present

## 2019-10-12 DIAGNOSIS — Z7901 Long term (current) use of anticoagulants: Secondary | ICD-10-CM | POA: Diagnosis not present

## 2019-10-12 DIAGNOSIS — H9193 Unspecified hearing loss, bilateral: Secondary | ICD-10-CM | POA: Diagnosis not present

## 2019-10-13 ENCOUNTER — Telehealth: Payer: Self-pay | Admitting: Internal Medicine

## 2019-10-13 NOTE — Telephone Encounter (Signed)
LVM giving ok for orders.  

## 2019-10-13 NOTE — Telephone Encounter (Signed)
New message:   Request for confirmation about the pt's DX for dementia. Is it contributing to the pt's frequent falls. Please advise.

## 2019-10-13 NOTE — Telephone Encounter (Signed)
New message:   Abigail Butts is calling with [facility] for Verbal orders for Speech Therapy for 1 week 6 for cognition. She ask if she does not answer please leave the order on her voicemail for she may be with another patient. Please advise.

## 2019-10-14 DIAGNOSIS — Z9181 History of falling: Secondary | ICD-10-CM | POA: Diagnosis not present

## 2019-10-14 DIAGNOSIS — Z7901 Long term (current) use of anticoagulants: Secondary | ICD-10-CM | POA: Diagnosis not present

## 2019-10-14 DIAGNOSIS — F0390 Unspecified dementia without behavioral disturbance: Secondary | ICD-10-CM | POA: Diagnosis not present

## 2019-10-14 DIAGNOSIS — Z8673 Personal history of transient ischemic attack (TIA), and cerebral infarction without residual deficits: Secondary | ICD-10-CM | POA: Diagnosis not present

## 2019-10-14 DIAGNOSIS — H9193 Unspecified hearing loss, bilateral: Secondary | ICD-10-CM | POA: Diagnosis not present

## 2019-10-14 NOTE — Telephone Encounter (Signed)
Spoke with Gracee. Gave response below. Expressed understanding.

## 2019-10-14 NOTE — Telephone Encounter (Signed)
LVM to call back in regards. 

## 2019-10-14 NOTE — Telephone Encounter (Signed)
He does have some dementia.  It may be contributing to the falls, but it is hard to say - he has not seen a neurologist.

## 2019-10-17 DIAGNOSIS — Z7901 Long term (current) use of anticoagulants: Secondary | ICD-10-CM | POA: Diagnosis not present

## 2019-10-17 DIAGNOSIS — Z9181 History of falling: Secondary | ICD-10-CM | POA: Diagnosis not present

## 2019-10-17 DIAGNOSIS — Z8673 Personal history of transient ischemic attack (TIA), and cerebral infarction without residual deficits: Secondary | ICD-10-CM | POA: Diagnosis not present

## 2019-10-17 DIAGNOSIS — F0390 Unspecified dementia without behavioral disturbance: Secondary | ICD-10-CM | POA: Diagnosis not present

## 2019-10-17 DIAGNOSIS — H9193 Unspecified hearing loss, bilateral: Secondary | ICD-10-CM | POA: Diagnosis not present

## 2019-10-19 DIAGNOSIS — H9193 Unspecified hearing loss, bilateral: Secondary | ICD-10-CM | POA: Diagnosis not present

## 2019-10-19 DIAGNOSIS — Z8673 Personal history of transient ischemic attack (TIA), and cerebral infarction without residual deficits: Secondary | ICD-10-CM | POA: Diagnosis not present

## 2019-10-19 DIAGNOSIS — Z9181 History of falling: Secondary | ICD-10-CM | POA: Diagnosis not present

## 2019-10-19 DIAGNOSIS — F0390 Unspecified dementia without behavioral disturbance: Secondary | ICD-10-CM | POA: Diagnosis not present

## 2019-10-19 DIAGNOSIS — Z7901 Long term (current) use of anticoagulants: Secondary | ICD-10-CM | POA: Diagnosis not present

## 2019-10-21 DIAGNOSIS — Z7901 Long term (current) use of anticoagulants: Secondary | ICD-10-CM | POA: Diagnosis not present

## 2019-10-21 DIAGNOSIS — Z9181 History of falling: Secondary | ICD-10-CM | POA: Diagnosis not present

## 2019-10-21 DIAGNOSIS — H9193 Unspecified hearing loss, bilateral: Secondary | ICD-10-CM | POA: Diagnosis not present

## 2019-10-21 DIAGNOSIS — F0390 Unspecified dementia without behavioral disturbance: Secondary | ICD-10-CM | POA: Diagnosis not present

## 2019-10-21 DIAGNOSIS — Z8673 Personal history of transient ischemic attack (TIA), and cerebral infarction without residual deficits: Secondary | ICD-10-CM | POA: Diagnosis not present

## 2019-10-25 DIAGNOSIS — H9193 Unspecified hearing loss, bilateral: Secondary | ICD-10-CM | POA: Diagnosis not present

## 2019-10-25 DIAGNOSIS — Z7901 Long term (current) use of anticoagulants: Secondary | ICD-10-CM | POA: Diagnosis not present

## 2019-10-25 DIAGNOSIS — Z8673 Personal history of transient ischemic attack (TIA), and cerebral infarction without residual deficits: Secondary | ICD-10-CM | POA: Diagnosis not present

## 2019-10-25 DIAGNOSIS — Z9181 History of falling: Secondary | ICD-10-CM | POA: Diagnosis not present

## 2019-10-25 DIAGNOSIS — F0390 Unspecified dementia without behavioral disturbance: Secondary | ICD-10-CM | POA: Diagnosis not present

## 2019-10-26 DIAGNOSIS — H9193 Unspecified hearing loss, bilateral: Secondary | ICD-10-CM | POA: Diagnosis not present

## 2019-10-26 DIAGNOSIS — F0390 Unspecified dementia without behavioral disturbance: Secondary | ICD-10-CM | POA: Diagnosis not present

## 2019-10-26 DIAGNOSIS — Z8673 Personal history of transient ischemic attack (TIA), and cerebral infarction without residual deficits: Secondary | ICD-10-CM | POA: Diagnosis not present

## 2019-10-26 DIAGNOSIS — Z7901 Long term (current) use of anticoagulants: Secondary | ICD-10-CM | POA: Diagnosis not present

## 2019-10-26 DIAGNOSIS — Z9181 History of falling: Secondary | ICD-10-CM | POA: Diagnosis not present

## 2019-10-28 DIAGNOSIS — Z7901 Long term (current) use of anticoagulants: Secondary | ICD-10-CM | POA: Diagnosis not present

## 2019-10-28 DIAGNOSIS — F0390 Unspecified dementia without behavioral disturbance: Secondary | ICD-10-CM | POA: Diagnosis not present

## 2019-10-28 DIAGNOSIS — Z8673 Personal history of transient ischemic attack (TIA), and cerebral infarction without residual deficits: Secondary | ICD-10-CM | POA: Diagnosis not present

## 2019-10-28 DIAGNOSIS — Z9181 History of falling: Secondary | ICD-10-CM | POA: Diagnosis not present

## 2019-10-28 DIAGNOSIS — H9193 Unspecified hearing loss, bilateral: Secondary | ICD-10-CM | POA: Diagnosis not present

## 2019-11-01 ENCOUNTER — Telehealth: Payer: Self-pay

## 2019-11-01 DIAGNOSIS — L600 Ingrowing nail: Secondary | ICD-10-CM

## 2019-11-01 NOTE — Telephone Encounter (Signed)
Referral ordered

## 2019-11-01 NOTE — Telephone Encounter (Signed)
New message    Wife calling   Referral: Podiatry   Reason: Ingrown toenail & sores   Facility: [facility] [address] [phone]  Asking for a callback

## 2019-11-01 NOTE — Telephone Encounter (Signed)
Wife is aware

## 2019-11-02 DIAGNOSIS — Z9181 History of falling: Secondary | ICD-10-CM | POA: Diagnosis not present

## 2019-11-02 DIAGNOSIS — Z8673 Personal history of transient ischemic attack (TIA), and cerebral infarction without residual deficits: Secondary | ICD-10-CM | POA: Diagnosis not present

## 2019-11-02 DIAGNOSIS — F0390 Unspecified dementia without behavioral disturbance: Secondary | ICD-10-CM | POA: Diagnosis not present

## 2019-11-02 DIAGNOSIS — Z7901 Long term (current) use of anticoagulants: Secondary | ICD-10-CM | POA: Diagnosis not present

## 2019-11-02 DIAGNOSIS — H9193 Unspecified hearing loss, bilateral: Secondary | ICD-10-CM | POA: Diagnosis not present

## 2019-11-03 ENCOUNTER — Other Ambulatory Visit: Payer: Self-pay

## 2019-11-03 ENCOUNTER — Ambulatory Visit: Payer: Medicare HMO | Admitting: Podiatry

## 2019-11-03 ENCOUNTER — Encounter: Payer: Self-pay | Admitting: Podiatry

## 2019-11-03 VITALS — Temp 97.6°F | Resp 16

## 2019-11-03 DIAGNOSIS — Z7901 Long term (current) use of anticoagulants: Secondary | ICD-10-CM

## 2019-11-03 DIAGNOSIS — L600 Ingrowing nail: Secondary | ICD-10-CM | POA: Diagnosis not present

## 2019-11-03 DIAGNOSIS — M79675 Pain in left toe(s): Secondary | ICD-10-CM

## 2019-11-03 DIAGNOSIS — B351 Tinea unguium: Secondary | ICD-10-CM

## 2019-11-03 DIAGNOSIS — M79674 Pain in right toe(s): Secondary | ICD-10-CM | POA: Diagnosis not present

## 2019-11-04 DIAGNOSIS — H9193 Unspecified hearing loss, bilateral: Secondary | ICD-10-CM | POA: Diagnosis not present

## 2019-11-04 DIAGNOSIS — F0390 Unspecified dementia without behavioral disturbance: Secondary | ICD-10-CM | POA: Diagnosis not present

## 2019-11-04 DIAGNOSIS — Z8673 Personal history of transient ischemic attack (TIA), and cerebral infarction without residual deficits: Secondary | ICD-10-CM | POA: Diagnosis not present

## 2019-11-04 DIAGNOSIS — Z9181 History of falling: Secondary | ICD-10-CM | POA: Diagnosis not present

## 2019-11-04 DIAGNOSIS — Z7901 Long term (current) use of anticoagulants: Secondary | ICD-10-CM | POA: Diagnosis not present

## 2019-11-06 DIAGNOSIS — Z7901 Long term (current) use of anticoagulants: Secondary | ICD-10-CM | POA: Diagnosis not present

## 2019-11-06 DIAGNOSIS — H9193 Unspecified hearing loss, bilateral: Secondary | ICD-10-CM | POA: Diagnosis not present

## 2019-11-06 DIAGNOSIS — Z9181 History of falling: Secondary | ICD-10-CM | POA: Diagnosis not present

## 2019-11-06 DIAGNOSIS — Z8673 Personal history of transient ischemic attack (TIA), and cerebral infarction without residual deficits: Secondary | ICD-10-CM | POA: Diagnosis not present

## 2019-11-06 DIAGNOSIS — F0390 Unspecified dementia without behavioral disturbance: Secondary | ICD-10-CM | POA: Diagnosis not present

## 2019-11-08 DIAGNOSIS — H9193 Unspecified hearing loss, bilateral: Secondary | ICD-10-CM | POA: Diagnosis not present

## 2019-11-08 DIAGNOSIS — Z8673 Personal history of transient ischemic attack (TIA), and cerebral infarction without residual deficits: Secondary | ICD-10-CM | POA: Diagnosis not present

## 2019-11-08 DIAGNOSIS — Z9181 History of falling: Secondary | ICD-10-CM | POA: Diagnosis not present

## 2019-11-08 DIAGNOSIS — F0390 Unspecified dementia without behavioral disturbance: Secondary | ICD-10-CM | POA: Diagnosis not present

## 2019-11-08 DIAGNOSIS — Z7901 Long term (current) use of anticoagulants: Secondary | ICD-10-CM | POA: Diagnosis not present

## 2019-11-08 NOTE — Progress Notes (Signed)
Subjective:   Patient ID: Henry Cotta., male   DOB: 86 y.o.   MRN: [id number]   HPI 86 year old male presents the office today for concerns of thick, discolored toenails that he cannot trim himself as well as for possible ingrown toenails.  The nails are tender but denies any redness or drainage or any swelling.  Also presents to have her feet checked.  Previously had some redness in the foot but denies any open sores and denies any drainage.  He is on Eliquis.   Review of Systems  All other systems reviewed and are negative.  Past Medical History:  Diagnosis Date  . CAD (coronary artery disease)    a. STEMI 6/17: LHC - oLAD 10, mLAD 50, dLAD 20, D2 inf subbranch 70 + sup subbranch 100, pRCA 20, oRPDA 40, EF 35-45% >> PCI:  2.25 x 12 mm resolute integrity DES to the superior subbranch of D2   . Essential and other specified forms of tremor   . External hemorrhoids without mention of complication   . History of ST elevation myocardial infarction (STEMI)    a. Ant-Lat STEMI >> DES to D2 subbranch  . History of stroke   . History of TIA (transient ischemic attack)   . HTN (hypertension)   . Hyperlipidemia   . Ischemic cardiomyopathy    a. Echo 6/17: Mid and distal anterior/inferior, septal and apical HK, mild concentric LVH, EF 30-35%, mild LAE  //  b. Echo 9/17 mild focal basal septal hypertrophy, EF 55-60%, normal wall motion, grade 1 diastolic dysfunction, mildly dilated aortic root (37 mm), normal RVSF, PASP 29 mmHg  . Lumbago   . Malignant neoplasm of prostate ([place])   . Nontraumatic rupture of tendons of biceps (long head)   . Paroxysmal atrial fibrillation (HCC)    during admit for STEMI 6/17 >> triple Rx high risk >> ASA + Plavix x 30 days post MI, then Eliquis + Plavix  . Sebaceous cyst   . Supraventricular premature beats   . Unspecified anomaly of tooth position     Past Surgical History:  Procedure Laterality Date  . APPENDECTOMY  1942  . BACK SURGERY     with  sciatica  . CARDIAC CATHETERIZATION N/A 11/19/2015   Procedure: Left Heart Cath and Coronary Angiography;  Surgeon: Burnell Blanks, MD;  Location: [place] [facility];  Service: Cardiovascular;  Laterality: N/A;  . CARDIAC CATHETERIZATION N/A 11/19/2015   Procedure: Coronary Stent Intervention;  Surgeon: Burnell Blanks, MD;  Location: [place] [facility];  Service: Cardiovascular;  Laterality: N/A;  . prostate cancer    . SPINE SURGERY N/A 2000   [name]     Current Outpatient Medications:  .  acetaminophen (TYLENOL) 325 MG tablet, Take 2 tablets (650 mg total) by mouth every 6 (six) hours as needed for mild pain, moderate pain or fever., Disp: 60 tablet, Rfl: 0 .  apixaban (ELIQUIS) 5 MG TABS tablet, Take 1 tablet (5 mg total) by mouth 2 (two) times daily., Disp: 180 tablet, Rfl: 3 .  aspirin EC 81 MG tablet, Take 1 tablet (81 mg total) by mouth daily., Disp: 90 tablet, Rfl: 3 .  atorvastatin (LIPITOR) 40 MG tablet, Take 1 tablet (40 mg total) by mouth daily. Please keep upcoming appt in April with Dr. Angelena Form before anymore refills. Thank you, Disp: 90 tablet, Rfl: 0 .  Cholecalciferol (VITAMIN D-3) 1000 UNITS CAPS, Take 1 capsule by mouth daily. Take one tablet by mouth once  daily., Disp: , Rfl:  .  docusate sodium (COLACE) 100 MG capsule, Take 100 mg by mouth daily., Disp: , Rfl:  .  lisinopril (ZESTRIL) 2.5 MG tablet, Take 1 tablet (2.5 mg total) by mouth daily. Please keep upcoming appt in April with Dr. Angelena Form before anymore refills. Thank you, Disp: 90 tablet, Rfl: 0 .  Multiple Vitamin (MULTIVITAMIN) tablet, Take 1 tablet by mouth daily., Disp: , Rfl:  .  nitroGLYCERIN (NITROSTAT) 0.4 MG SL tablet, Place 1 tablet (0.4 mg total) under the tongue every 5 (five) minutes as needed for chest pain., Disp: 25 tablet, Rfl: 3  No Known Allergies        Objective:  Physical Exam  General: NAD  Dermatological: Nails are hypertrophic, dystrophic, brittle, discolored,  elongated 10. No surrounding redness or drainage. Tenderness nails 1-5 bilaterally.  Incurvation of the nails without any drainage or pus or signs of infection.  No open lesions or pre-ulcerative lesions are identified today.  Vascular: Dorsalis Pedis artery and Posterior Tibial artery pedal pulses are palpable bilateral with immedate capillary fill time. There is no pain with calf compression, swelling, warmth, erythema.   Neruologic: Grossly intact via light touch bilateral.   Musculoskeletal: No gross boney pedal deformities bilateral. No pain, crepitus, or limitation noted with foot and ankle range of motion bilateral.      Assessment:   Symptomatic onychomycosis     Plan:  -Treatment options discussed including all alternatives, risks, and complications -Etiology of symptoms were discussed -Nails debrided 10 without complications or bleeding. -Daily foot inspection -Follow-up in 3 months or sooner if any problems arise. In the meantime, encouraged to call the office with any questions, concerns, change in symptoms.   Celesta Gentile, DPM

## 2019-11-09 DIAGNOSIS — H9193 Unspecified hearing loss, bilateral: Secondary | ICD-10-CM | POA: Diagnosis not present

## 2019-11-09 DIAGNOSIS — Z7901 Long term (current) use of anticoagulants: Secondary | ICD-10-CM | POA: Diagnosis not present

## 2019-11-09 DIAGNOSIS — F0390 Unspecified dementia without behavioral disturbance: Secondary | ICD-10-CM | POA: Diagnosis not present

## 2019-11-09 DIAGNOSIS — Z8673 Personal history of transient ischemic attack (TIA), and cerebral infarction without residual deficits: Secondary | ICD-10-CM | POA: Diagnosis not present

## 2019-11-09 DIAGNOSIS — Z9181 History of falling: Secondary | ICD-10-CM | POA: Diagnosis not present

## 2019-11-15 DIAGNOSIS — H9193 Unspecified hearing loss, bilateral: Secondary | ICD-10-CM | POA: Diagnosis not present

## 2019-11-15 DIAGNOSIS — Z9181 History of falling: Secondary | ICD-10-CM | POA: Diagnosis not present

## 2019-11-15 DIAGNOSIS — Z7901 Long term (current) use of anticoagulants: Secondary | ICD-10-CM | POA: Diagnosis not present

## 2019-11-15 DIAGNOSIS — Z8673 Personal history of transient ischemic attack (TIA), and cerebral infarction without residual deficits: Secondary | ICD-10-CM | POA: Diagnosis not present

## 2019-11-15 DIAGNOSIS — F0390 Unspecified dementia without behavioral disturbance: Secondary | ICD-10-CM | POA: Diagnosis not present

## 2019-11-16 DIAGNOSIS — Z9181 History of falling: Secondary | ICD-10-CM | POA: Diagnosis not present

## 2019-11-16 DIAGNOSIS — H9193 Unspecified hearing loss, bilateral: Secondary | ICD-10-CM | POA: Diagnosis not present

## 2019-11-16 DIAGNOSIS — F0390 Unspecified dementia without behavioral disturbance: Secondary | ICD-10-CM | POA: Diagnosis not present

## 2019-11-16 DIAGNOSIS — Z7901 Long term (current) use of anticoagulants: Secondary | ICD-10-CM | POA: Diagnosis not present

## 2019-11-16 DIAGNOSIS — Z8673 Personal history of transient ischemic attack (TIA), and cerebral infarction without residual deficits: Secondary | ICD-10-CM | POA: Diagnosis not present

## 2019-11-20 ENCOUNTER — Other Ambulatory Visit: Payer: Self-pay | Admitting: Cardiovascular Disease

## 2019-11-20 ENCOUNTER — Other Ambulatory Visit: Payer: Self-pay | Admitting: Physician Assistant

## 2019-11-23 DIAGNOSIS — Z7901 Long term (current) use of anticoagulants: Secondary | ICD-10-CM | POA: Diagnosis not present

## 2019-11-23 DIAGNOSIS — Z8673 Personal history of transient ischemic attack (TIA), and cerebral infarction without residual deficits: Secondary | ICD-10-CM | POA: Diagnosis not present

## 2019-11-23 DIAGNOSIS — F0390 Unspecified dementia without behavioral disturbance: Secondary | ICD-10-CM | POA: Diagnosis not present

## 2019-11-23 DIAGNOSIS — Z9181 History of falling: Secondary | ICD-10-CM | POA: Diagnosis not present

## 2019-11-23 DIAGNOSIS — H9193 Unspecified hearing loss, bilateral: Secondary | ICD-10-CM | POA: Diagnosis not present

## 2019-12-01 DIAGNOSIS — H9193 Unspecified hearing loss, bilateral: Secondary | ICD-10-CM | POA: Diagnosis not present

## 2019-12-01 DIAGNOSIS — Z7901 Long term (current) use of anticoagulants: Secondary | ICD-10-CM | POA: Diagnosis not present

## 2019-12-01 DIAGNOSIS — F0390 Unspecified dementia without behavioral disturbance: Secondary | ICD-10-CM | POA: Diagnosis not present

## 2019-12-01 DIAGNOSIS — Z8673 Personal history of transient ischemic attack (TIA), and cerebral infarction without residual deficits: Secondary | ICD-10-CM | POA: Diagnosis not present

## 2019-12-01 DIAGNOSIS — Z9181 History of falling: Secondary | ICD-10-CM | POA: Diagnosis not present

## 2019-12-02 ENCOUNTER — Telehealth: Payer: Self-pay | Admitting: Internal Medicine

## 2019-12-02 NOTE — Telephone Encounter (Signed)
New message:   Henry Franklin is calling from [facility] to get verbal orders for Recert for PT for 1x a week for 8 weeks. Please advise.

## 2019-12-02 NOTE — Telephone Encounter (Signed)
Left message today with verbal ok.

## 2019-12-02 NOTE — Telephone Encounter (Signed)
ok 

## 2019-12-06 DIAGNOSIS — Z8673 Personal history of transient ischemic attack (TIA), and cerebral infarction without residual deficits: Secondary | ICD-10-CM | POA: Diagnosis not present

## 2019-12-06 DIAGNOSIS — Z9181 History of falling: Secondary | ICD-10-CM | POA: Diagnosis not present

## 2019-12-06 DIAGNOSIS — F0390 Unspecified dementia without behavioral disturbance: Secondary | ICD-10-CM | POA: Diagnosis not present

## 2019-12-06 DIAGNOSIS — Z7901 Long term (current) use of anticoagulants: Secondary | ICD-10-CM | POA: Diagnosis not present

## 2019-12-06 DIAGNOSIS — R41841 Cognitive communication deficit: Secondary | ICD-10-CM | POA: Diagnosis not present

## 2019-12-06 DIAGNOSIS — H9193 Unspecified hearing loss, bilateral: Secondary | ICD-10-CM | POA: Diagnosis not present

## 2019-12-09 DIAGNOSIS — F0390 Unspecified dementia without behavioral disturbance: Secondary | ICD-10-CM | POA: Diagnosis not present

## 2019-12-09 DIAGNOSIS — H9193 Unspecified hearing loss, bilateral: Secondary | ICD-10-CM | POA: Diagnosis not present

## 2019-12-09 DIAGNOSIS — Z7901 Long term (current) use of anticoagulants: Secondary | ICD-10-CM | POA: Diagnosis not present

## 2019-12-09 DIAGNOSIS — Z8673 Personal history of transient ischemic attack (TIA), and cerebral infarction without residual deficits: Secondary | ICD-10-CM | POA: Diagnosis not present

## 2019-12-09 DIAGNOSIS — Z9181 History of falling: Secondary | ICD-10-CM | POA: Diagnosis not present

## 2019-12-09 DIAGNOSIS — R41841 Cognitive communication deficit: Secondary | ICD-10-CM | POA: Diagnosis not present

## 2019-12-14 DIAGNOSIS — Z7901 Long term (current) use of anticoagulants: Secondary | ICD-10-CM | POA: Diagnosis not present

## 2019-12-14 DIAGNOSIS — Z8673 Personal history of transient ischemic attack (TIA), and cerebral infarction without residual deficits: Secondary | ICD-10-CM | POA: Diagnosis not present

## 2019-12-14 DIAGNOSIS — H9193 Unspecified hearing loss, bilateral: Secondary | ICD-10-CM | POA: Diagnosis not present

## 2019-12-14 DIAGNOSIS — R41841 Cognitive communication deficit: Secondary | ICD-10-CM | POA: Diagnosis not present

## 2019-12-14 DIAGNOSIS — F0390 Unspecified dementia without behavioral disturbance: Secondary | ICD-10-CM | POA: Diagnosis not present

## 2019-12-14 DIAGNOSIS — Z9181 History of falling: Secondary | ICD-10-CM | POA: Diagnosis not present

## 2019-12-19 ENCOUNTER — Telehealth: Payer: Self-pay | Admitting: Family

## 2019-12-19 NOTE — Telephone Encounter (Signed)
Called and left message for patient's wife that appointment would be cancelled and Mickel Baas would treat without OV for the worms.

## 2019-12-19 NOTE — Telephone Encounter (Signed)
Please let him know we will treat for the suspected worms without an OV; if the symptoms then persist after treatment, he can schedule an appointment with Dr. Quay Burow.

## 2019-12-20 ENCOUNTER — Other Ambulatory Visit: Payer: Self-pay

## 2019-12-20 ENCOUNTER — Ambulatory Visit (INDEPENDENT_AMBULATORY_CARE_PROVIDER_SITE_OTHER): Payer: Medicare HMO | Admitting: Family

## 2019-12-20 VITALS — BP 102/68 | HR 60 | Temp 98.0°F | Ht 70.0 in | Wt 141.0 lb

## 2019-12-20 DIAGNOSIS — B839 Helminthiasis, unspecified: Secondary | ICD-10-CM

## 2019-12-20 MED ORDER — MEBENDAZOLE 100 MG PO CHEW: 100.0000 mg | CHEWABLE_TABLET | Freq: Two times a day (BID) | ORAL | 0 refills | Status: DC

## 2019-12-20 NOTE — Progress Notes (Signed)
Henry Mckim. is a 86 y.o. male with the following history as recorded in EpicCare:  Patient Active Problem List   Diagnosis Date Noted   Bilateral impacted cerumen 09/14/2019   Hearing loss 09/14/2019   Memory difficulties 08/24/2019   Frequent falls 08/11/2018   Poor balance 08/11/2018   Physical deconditioning 08/11/2018   Neck pain 02/24/2017   Cervical paraspinal muscle spasm 12/23/2016   CAD (coronary artery disease) 12/03/2015   Atrial fibrillation ([place]) 11/21/2015   STEMI (ST elevation myocardial infarction) ([place]) 11/19/2015   History of CVA (cerebrovascular accident) 05/31/2014   Pulmonary embolism ([place]) 05/20/2014   HTN (hypertension) 05/20/2014   Prostate cancer ([place]) 04/05/2014   Spinal stenosis of lumbar region 11/15/2013   Falls 05/10/2013   Hyperlipidemia    Benign essential tremor    Lumbago    Palpitations    Ingrown toenail without infection 09/07/2012    Current Outpatient Medications  Medication Sig Dispense Refill   acetaminophen (TYLENOL) 325 MG tablet Take 2 tablets (650 mg total) by mouth every 6 (six) hours as needed for mild pain, moderate pain or fever. 60 tablet 0   apixaban (ELIQUIS) 5 MG TABS tablet Take 1 tablet (5 mg total) by mouth 2 (two) times daily. 180 tablet 3   aspirin EC 81 MG tablet Take 1 tablet (81 mg total) by mouth daily. 90 tablet 3   atorvastatin (LIPITOR) 40 MG tablet Take 1 tablet (40 mg total) by mouth daily. 90 tablet 2   Cholecalciferol (VITAMIN D-3) 1000 UNITS CAPS Take 1 capsule by mouth daily. Take one tablet by mouth once daily.     docusate sodium (COLACE) 100 MG capsule Take 100 mg by mouth daily.     lisinopril (ZESTRIL) 2.5 MG tablet Take 1 tablet (2.5 mg total) by mouth daily. 90 tablet 2   Multiple Vitamin (MULTIVITAMIN) tablet Take 1 tablet by mouth daily.     nitroGLYCERIN (NITROSTAT) 0.4 MG SL tablet Place 1 tablet (0.4 mg total) under the tongue every 5 (five) minutes as needed  for chest pain. 25 tablet 3   mebendazole (VERMOX) 100 MG chewable tablet Chew 1 tablet (100 mg total) by mouth 2 (two) times daily. 6 tablet 0   No current facility-administered medications for this visit.    Allergies: Patient has no known allergies.  Past Medical History:  Diagnosis Date   CAD (coronary artery disease)    a. STEMI 6/17: LHC - oLAD 10, mLAD 50, dLAD 20, D2 inf subbranch 70 + sup subbranch 100, pRCA 20, oRPDA 40, EF 35-45% >> PCI:  2.25 x 12 mm resolute integrity DES to the superior subbranch of D2    Essential and other specified forms of tremor    External hemorrhoids without mention of complication    History of ST elevation myocardial infarction (STEMI)    a. Ant-Lat STEMI >> DES to D2 subbranch   History of stroke    History of TIA (transient ischemic attack)    HTN (hypertension)    Hyperlipidemia    Ischemic cardiomyopathy    a. Echo 6/17: Mid and distal anterior/inferior, septal and apical HK, mild concentric LVH, EF 30-35%, mild LAE  //  b. Echo 9/17 mild focal basal septal hypertrophy, EF 55-60%, normal wall motion, grade 1 diastolic dysfunction, mildly dilated aortic root (37 mm), normal RVSF, PASP 29 mmHg   Lumbago    Malignant neoplasm of prostate (HCC)    Nontraumatic rupture of tendons of biceps (long head)  Paroxysmal atrial fibrillation (HCC)    during admit for STEMI 6/17 >> triple Rx high risk >> ASA + Plavix x 30 days post MI, then Eliquis + Plavix   Sebaceous cyst    Supraventricular premature beats    Unspecified anomaly of tooth position     Past Surgical History:  Procedure Laterality Date   APPENDECTOMY  1942   BACK SURGERY     with sciatica   CARDIAC CATHETERIZATION N/A 11/19/2015   Procedure: Left Heart Cath and Coronary Angiography;  Surgeon: Burnell Blanks, MD;  Location: [facility];  Service: Cardiovascular;  Laterality: N/A;   CARDIAC CATHETERIZATION N/A 11/19/2015   Procedure: Coronary Stent  Intervention;  Surgeon: Burnell Blanks, MD;  Location: [facility];  Service: Cardiovascular;  Laterality: N/A;   prostate cancer     SPINE SURGERY N/A 2000   [place]    Family History  Problem Relation Age of Onset   Cancer Father 58       brain tumor    Social History   Tobacco Use   Smoking status: Never Smoker   Smokeless tobacco: Never Used  Substance Use Topics   Alcohol use: No    Alcohol/week: 0.0 standard drinks    Subjective:  Accompanied by wife; patient is adamant and concerned that he recently saw worms in his bowel movement on one occasion; denies any abdominal pain/ recent travel or recent ingestion of meat; wife denies any personal symptoms for herself; patient admits he is very worried because he knew a friend who died from intestinal worms;   Objective:  Vitals:   12/20/19 1148  BP: 102/68  Pulse: 60  Temp: 98 F (36.7 C)  TempSrc: Oral  SpO2: 96%  Weight: 141 lb (64 kg)  Height: 5\' 10"  (1.778 m)    General: Well developed, well nourished, in no acute distress  Skin : Warm and dry.  Head: Normocephalic and atraumatic  Lungs: Respirations unlabored; clear to auscultation bilaterally without wheeze, rales, rhonchi  Abdomen: Soft; nontender; nondistended; normoactive bowel sounds; no masses or hepatosplenomegaly  Neurologic: Alert and oriented; speech intact; face symmetrical;  Rectal exam- stool noted on exam; visualization of stool does not show any type of worm- do not have a microscope available here;    Assessment:  1. Worms in stool     Plan:  Low suspicion that patient actually saw worms but he is understandably concerned; will treat with Vermox x 3 days; if symptoms persist, will need to consider further stool testing/ GI evaluation; both he and his wife seem comfortable with this treatment plan.   This visit occurred during the SARS-CoV-2 public health emergency.  Safety protocols were in place, including screening questions  prior to the visit, additional usage of staff PPE, and extensive cleaning of exam room while observing appropriate contact time as indicated for disinfecting solutions.      No follow-ups on file.  No orders of the defined types were placed in this encounter.   Requested Prescriptions   Signed Prescriptions Disp Refills   mebendazole (VERMOX) 100 MG chewable tablet 6 tablet 0    Sig: Chew 1 tablet (100 mg total) by mouth 2 (two) times daily.

## 2019-12-21 ENCOUNTER — Telehealth: Payer: Self-pay

## 2019-12-21 ENCOUNTER — Other Ambulatory Visit: Payer: Self-pay | Admitting: Family

## 2019-12-21 DIAGNOSIS — Z8673 Personal history of transient ischemic attack (TIA), and cerebral infarction without residual deficits: Secondary | ICD-10-CM | POA: Diagnosis not present

## 2019-12-21 DIAGNOSIS — Z7901 Long term (current) use of anticoagulants: Secondary | ICD-10-CM | POA: Diagnosis not present

## 2019-12-21 DIAGNOSIS — H9193 Unspecified hearing loss, bilateral: Secondary | ICD-10-CM | POA: Diagnosis not present

## 2019-12-21 DIAGNOSIS — Z9181 History of falling: Secondary | ICD-10-CM | POA: Diagnosis not present

## 2019-12-21 DIAGNOSIS — R41841 Cognitive communication deficit: Secondary | ICD-10-CM | POA: Diagnosis not present

## 2019-12-21 DIAGNOSIS — F0390 Unspecified dementia without behavioral disturbance: Secondary | ICD-10-CM | POA: Diagnosis not present

## 2019-12-21 MED ORDER — ALBENDAZOLE 200 MG PO TABS: 200.0000 mg | ORAL_TABLET | Freq: Two times a day (BID) | ORAL | 0 refills | Status: DC

## 2019-12-21 NOTE — Telephone Encounter (Signed)
New message    Pt c/o medication issue:   1. Name of Medication: Mebendazole 100 mg Oral 2 times daily  2. How are you currently taking this medication (dosage and times per day)?  Daily   3. Are you having a reaction (difficulty breathing--STAT)? No   4. What is your medication issue? The cost is hight $57.00 -- looking for cheaper medication

## 2019-12-22 NOTE — Telephone Encounter (Signed)
F/u  Wife called and wants to apologize for the inconvenience her husband did have a bowel movement and to discontinue to new medication request.

## 2019-12-29 DIAGNOSIS — Z8673 Personal history of transient ischemic attack (TIA), and cerebral infarction without residual deficits: Secondary | ICD-10-CM | POA: Diagnosis not present

## 2019-12-29 DIAGNOSIS — Z9181 History of falling: Secondary | ICD-10-CM | POA: Diagnosis not present

## 2019-12-29 DIAGNOSIS — F0390 Unspecified dementia without behavioral disturbance: Secondary | ICD-10-CM | POA: Diagnosis not present

## 2019-12-29 DIAGNOSIS — Z7901 Long term (current) use of anticoagulants: Secondary | ICD-10-CM | POA: Diagnosis not present

## 2019-12-29 DIAGNOSIS — R41841 Cognitive communication deficit: Secondary | ICD-10-CM | POA: Diagnosis not present

## 2019-12-29 DIAGNOSIS — H9193 Unspecified hearing loss, bilateral: Secondary | ICD-10-CM | POA: Diagnosis not present

## 2020-01-04 DIAGNOSIS — H9193 Unspecified hearing loss, bilateral: Secondary | ICD-10-CM | POA: Diagnosis not present

## 2020-01-04 DIAGNOSIS — R41841 Cognitive communication deficit: Secondary | ICD-10-CM | POA: Diagnosis not present

## 2020-01-04 DIAGNOSIS — F0390 Unspecified dementia without behavioral disturbance: Secondary | ICD-10-CM | POA: Diagnosis not present

## 2020-01-04 DIAGNOSIS — Z7901 Long term (current) use of anticoagulants: Secondary | ICD-10-CM | POA: Diagnosis not present

## 2020-01-04 DIAGNOSIS — Z8673 Personal history of transient ischemic attack (TIA), and cerebral infarction without residual deficits: Secondary | ICD-10-CM | POA: Diagnosis not present

## 2020-01-04 DIAGNOSIS — Z9181 History of falling: Secondary | ICD-10-CM | POA: Diagnosis not present

## 2020-01-05 DIAGNOSIS — R41841 Cognitive communication deficit: Secondary | ICD-10-CM | POA: Diagnosis not present

## 2020-01-05 DIAGNOSIS — H9193 Unspecified hearing loss, bilateral: Secondary | ICD-10-CM | POA: Diagnosis not present

## 2020-01-05 DIAGNOSIS — Z7901 Long term (current) use of anticoagulants: Secondary | ICD-10-CM | POA: Diagnosis not present

## 2020-01-05 DIAGNOSIS — Z9181 History of falling: Secondary | ICD-10-CM | POA: Diagnosis not present

## 2020-01-05 DIAGNOSIS — F0390 Unspecified dementia without behavioral disturbance: Secondary | ICD-10-CM | POA: Diagnosis not present

## 2020-01-05 DIAGNOSIS — Z8673 Personal history of transient ischemic attack (TIA), and cerebral infarction without residual deficits: Secondary | ICD-10-CM | POA: Diagnosis not present

## 2020-01-11 DIAGNOSIS — Z7901 Long term (current) use of anticoagulants: Secondary | ICD-10-CM | POA: Diagnosis not present

## 2020-01-11 DIAGNOSIS — R41841 Cognitive communication deficit: Secondary | ICD-10-CM | POA: Diagnosis not present

## 2020-01-11 DIAGNOSIS — Z9181 History of falling: Secondary | ICD-10-CM | POA: Diagnosis not present

## 2020-01-11 DIAGNOSIS — H9193 Unspecified hearing loss, bilateral: Secondary | ICD-10-CM | POA: Diagnosis not present

## 2020-01-11 DIAGNOSIS — Z8673 Personal history of transient ischemic attack (TIA), and cerebral infarction without residual deficits: Secondary | ICD-10-CM | POA: Diagnosis not present

## 2020-01-11 DIAGNOSIS — F0390 Unspecified dementia without behavioral disturbance: Secondary | ICD-10-CM | POA: Diagnosis not present

## 2020-01-18 DIAGNOSIS — R41841 Cognitive communication deficit: Secondary | ICD-10-CM | POA: Diagnosis not present

## 2020-01-18 DIAGNOSIS — F0390 Unspecified dementia without behavioral disturbance: Secondary | ICD-10-CM | POA: Diagnosis not present

## 2020-01-18 DIAGNOSIS — Z9181 History of falling: Secondary | ICD-10-CM | POA: Diagnosis not present

## 2020-01-18 DIAGNOSIS — Z7901 Long term (current) use of anticoagulants: Secondary | ICD-10-CM | POA: Diagnosis not present

## 2020-01-18 DIAGNOSIS — H9193 Unspecified hearing loss, bilateral: Secondary | ICD-10-CM | POA: Diagnosis not present

## 2020-01-18 DIAGNOSIS — Z8673 Personal history of transient ischemic attack (TIA), and cerebral infarction without residual deficits: Secondary | ICD-10-CM | POA: Diagnosis not present

## 2020-01-20 ENCOUNTER — Other Ambulatory Visit: Payer: Self-pay

## 2020-01-20 ENCOUNTER — Ambulatory Visit: Payer: Medicare HMO | Admitting: Pharmacist

## 2020-01-20 DIAGNOSIS — I25118 Atherosclerotic heart disease of native coronary artery with other forms of angina pectoris: Secondary | ICD-10-CM

## 2020-01-20 DIAGNOSIS — I10 Essential (primary) hypertension: Secondary | ICD-10-CM

## 2020-01-20 DIAGNOSIS — I480 Paroxysmal atrial fibrillation: Secondary | ICD-10-CM

## 2020-01-20 NOTE — Chronic Care Management (AMB) (Signed)
Chronic Care Management Pharmacy  Name: Mardell Cragg.  MRN: [id number] DOB: Jun 07, 1926   Chief Complaint/ HPI  Henry Franklin.,  86 y.o. , male presents for their Follow-Up CCM visit with the clinical pharmacist via telephone.  PCP : [name], MD  Their chronic conditions include: HTN, CAD (STEMI), Afib, Hx PE, HLD, essential tremor, prostate cancer  Office Visits: 12/20/19 NP Jodi Mourning: acute visit for c/o worms in stool, low suspicion that this is true but tx with Vermox x 3 days  09/14/19 Dr Quay Burow OV: earwax removal  08/29/19 Dr Quay Burow VV: chronic f/u, physical deconditioning, memory issues. Ordered PT/OT, speech therapy/cognitive work.  08/11/18 Dr Quay Burow: acute visit for multiple falls. Refer to PT. On Eliquis, cardiology aware of falls and have mentioned stopping Eliquis.  Consult Visit: 11/03/19 Dr Jacqualyn Posey (podiatry): ingrown toenail  09/22/19 Dr Lucia Gaskins (ENT): earwax removal  09/19/19 Dr Angelena Form (cardiology): f/u for CAD, Afib, stable no med changes.  07/20/18 Dr Glenford Peers (cardiology): monitor falls closely, may need to reconsider Eliquis given high risk head bleed  Medications: Outpatient Encounter Medications as of 01/20/2020  Medication Sig  . acetaminophen (TYLENOL) 325 MG tablet Take 2 tablets (650 mg total) by mouth every 6 (six) hours as needed for mild pain, moderate pain or fever.  [name] albendazole (ALBENZA) 200 MG tablet Take 1 tablet (200 mg total) by mouth 2 (two) times daily with a meal.  . apixaban (ELIQUIS) 5 MG TABS tablet Take 1 tablet (5 mg total) by mouth 2 (two) times daily.  [name] aspirin EC 81 MG tablet Take 1 tablet (81 mg total) by mouth daily.  [name] atorvastatin (LIPITOR) 40 MG tablet Take 1 tablet (40 mg total) by mouth daily.  . Cholecalciferol (VITAMIN D-3) 1000 UNITS CAPS Take 1 capsule by mouth daily. Take one tablet by mouth once daily.  [name] docusate sodium (COLACE) 100 MG capsule Take 100 mg by mouth daily.  [name] lisinopril (ZESTRIL) 2.5 MG tablet  Take 1 tablet (2.5 mg total) by mouth daily.  . Multiple Vitamin (MULTIVITAMIN) tablet Take 1 tablet by mouth daily.  . nitroGLYCERIN (NITROSTAT) 0.4 MG SL tablet Place 1 tablet (0.4 mg total) under the tongue every 5 (five) minutes as needed for chest pain.   No facility-administered encounter medications on file as of 01/20/2020.     Current Diagnosis/Assessment:  Goals Addressed            This Visit's Progress   . Pharmacy Care Plan   On track    Current Barriers:  . Chronic Disease Management support, education, and care coordination needs related to CAD, HTN, and HLD, Afib/PE  Pharmacist Clinical Goal(s):  [name] Maintain BP between 110/60 and 130/80 . Improve ease of medication administration   Interventions: . Comprehensive medication review performed. [name] Utilize [facility] for medication packaging and delivery  Patient Self Care Activities:  . Wife administers medications as prescribed, Calls pharmacy for medication refills, and Calls provider office for new concerns or questions  Initial goal documentation       AFIB/PE   Patient is currently rate controlled. Pulse Readings from Last 3 Encounters:  12/20/19 60  09/19/19 (!) 51  09/14/19 64   Kidney Function Lab Results  Component Value Date/Time   CREATININE 0.95 07/20/2018 03:08 PM   CREATININE 1.06 08/18/2017 07:32 AM   CREATININE 1.01 04/07/2016 04:40 PM   CREATININE 1.30 (H) 12/05/2015 10:53 AM   GFRNONAA 71 07/20/2018 03:08 PM   GFRAA  82 07/20/2018 03:08 PM   K 4.5 07/20/2018 03:08 PM   K 4.6 08/18/2017 07:32 AM   Patient has failed these meds in past: n/a Patient is currently controlled on the following medications:   Eliquis 5 mg BID  We discussed:  Risk of bleeding with aspirin and Eliquis, denies s/sx of bleeding.  Plan  Continue current medications   Hypertension   BP goal < 140/90  Office blood pressures are  BP Readings from Last 3 Encounters:  12/20/19 102/68  09/19/19  (!) 142/76  09/14/19 (!) 148/72   Patient checks BP at home infrequently Patient home BP readings are ranging: n/a  Patient has failed these meds in the past: n/a Patient is currently controlled on the following meds:   lisinopril 2.5 mg daily,  We discussed BP is consistently controlled in office, checking occasionally at home to make sure BP is not too low.  Plan  Continue current medications and control with diet and exercise   Hyperlipidemia/CAD   LDL goal < 70 S/p STEMI 11/2015 s/p PCI  Lipid Panel     Component Value Date/Time   CHOL 114 09/19/2019 1636   TRIG 75 09/19/2019 1636   HDL 46 09/19/2019 1636   CHOLHDL 2.5 09/19/2019 1636   CHOLHDL 2.6 01/14/2016 0736   VLDL 15 01/14/2016 0736   LDLCALC 53 09/19/2019 1636   LABVLDL 15 09/19/2019 1636    Hepatic Function Latest Ref Rng & Units 09/19/2019 08/18/2017 06/19/2016  Total Protein 6.0 - 8.5 g/dL 5.7(L) 5.9(L) 6.0(L)  Albumin 3.5 - 4.6 g/dL 3.7 3.8 3.4(L)  AST 0 - 40 IU/L 27 25 36  ALT 0 - 44 IU/L 23 23 28   Alk Phosphatase 39 - 117 IU/L 77 72 59  Total Bilirubin 0.0 - 1.2 mg/dL 0.3 0.7 0.7  Bilirubin, Direct 0.00 - 0.40 mg/dL 0.13 - -   Patient has failed these meds in past: n/a Patient is currently controlled on the following medications:   atorvastatin 40 mg daily,   nitroglycerin 0.4 mg SL prn,   aspirin 81 mg daily  We discussed:  Cholesterol goals; benefits of statin  Plan  Continue current medications and control with diet and exercise   Constipation   Patient has failed these meds in past: n/a Patient is currently controlled on the following medications:   docusate 100 mg daily,   Miralax daily  We discussed:  Pt does not endorse issues currently  Plan  Continue current medications   Medication Management   [facility] for all medications except Eliquis from [place] [facility] [name] Arbie Cookey sets up meds for him each day Pt endorses 100%  compliance  We discussed: Pt is satisfied with current pharmacy services  Plan  Continue current medication management   Follow up: as needed  Charlene Brooke, PharmD, BCACP Clinical Pharmacist [facility] [phone]

## 2020-01-20 NOTE — Patient Instructions (Addendum)
Visit Information  Phone number for Pharmacist: [phone]  Goals Addressed            This Visit's Progress   . Pharmacy Care Plan   On track    Current Barriers:  . Chronic Disease Management support, education, and care coordination needs related to CAD, HTN, and HLD, Afib/PE  Pharmacist Clinical Goal(s):  [name] Maintain BP between 110/60 and 130/80 . Improve ease of medication administration   Interventions: . Comprehensive medication review performed. [name] Utilize [facility] for medication packaging and delivery  Patient Self Care Activities:  . Wife administers medications as prescribed, Calls pharmacy for medication refills, and Calls provider office for new concerns or questions  Initial goal documentation      Patient verbalizes understanding of instructions provided today.  The pharmacy team will reach out to the patient again over the next 180 days.   Charlene Brooke, PharmD, BCACP Clinical Pharmacist [facility] [place] Maintenance After Age 43 After age 56, you are at a higher risk for certain long-term diseases and infections as well as injuries from falls. Falls are a major cause of broken bones and head injuries in people who are older than age 44. Getting regular preventive care can help to keep you healthy and well. Preventive care includes getting regular testing and making lifestyle changes as recommended by your health care provider. Talk with your health care provider about:  Which screenings and tests you should have. A screening is a test that checks for a disease when you have no symptoms.  A diet and exercise plan that is right for you. What should I know about screenings and tests to prevent falls? Screening and testing are the best ways to find a health problem early. Early diagnosis and treatment give you the best chance of managing medical conditions that are common after age 68. Certain conditions and  lifestyle choices may make you more likely to have a fall. Your health care provider may recommend:  Regular vision checks. Poor vision and conditions such as cataracts can make you more likely to have a fall. If you wear glasses, make sure to get your prescription updated if your vision changes.  Medicine review. Work with your health care provider to regularly review all of the medicines you are taking, including over-the-counter medicines. Ask your health care provider about any side effects that may make you more likely to have a fall. Tell your health care provider if any medicines that you take make you feel dizzy or sleepy.  Osteoporosis screening. Osteoporosis is a condition that causes the bones to get weaker. This can make the bones weak and cause them to break more easily.  Blood pressure screening. Blood pressure changes and medicines to control blood pressure can make you feel dizzy.  Strength and balance checks. Your health care provider may recommend certain tests to check your strength and balance while standing, walking, or changing positions.  Foot health exam. Foot pain and numbness, as well as not wearing proper footwear, can make you more likely to have a fall.  Depression screening. You may be more likely to have a fall if you have a fear of falling, feel emotionally low, or feel unable to do activities that you used to do.  Alcohol use screening. Using too much alcohol can affect your balance and may make you more likely to have a fall. What actions can I take to lower my risk of falls? General  instructions  Talk with your health care provider about your risks for falling. Tell your health care provider if: ? You fall. Be sure to tell your health care provider about all falls, even ones that seem minor. ? You feel dizzy, sleepy, or off-balance.  Take over-the-counter and prescription medicines only as told by your health care provider. These include any  supplements.  Eat a healthy diet and maintain a healthy weight. A healthy diet includes low-fat dairy products, low-fat (lean) meats, and fiber from whole grains, beans, and lots of fruits and vegetables. Home safety  Remove any tripping hazards, such as rugs, cords, and clutter.  Install safety equipment such as grab bars in bathrooms and safety rails on stairs.  Keep rooms and walkways well-lit. Activity   Follow a regular exercise program to stay fit. This will help you maintain your balance. Ask your health care provider what types of exercise are appropriate for you.  If you need a cane or walker, use it as recommended by your health care provider.  Wear supportive shoes that have nonskid soles. Lifestyle  Do not drink alcohol if your health care provider tells you not to drink.  If you drink alcohol, limit how much you have: ? 0-1 drink a day for women. ? 0-2 drinks a day for men.  Be aware of how much alcohol is in your drink. In the [place] one drink equals one typical bottle of beer (12 oz), one-half glass of wine (5 oz), or one shot of hard liquor (1 oz).  Do not use any products that contain nicotine or tobacco, such as cigarettes and e-cigarettes. If you need help quitting, ask your health care provider. Summary  Having a healthy lifestyle and getting preventive care can help to protect your health and wellness after age 13.  Screening and testing are the best way to find a health problem early and help you avoid having a fall. Early diagnosis and treatment give you the best chance for managing medical conditions that are more common for people who are older than age 36.  Falls are a major cause of broken bones and head injuries in people who are older than age 4. Take precautions to prevent a fall at home.  Work with your health care provider to learn what changes you can make to improve your health and wellness and to prevent falls. This information is not intended  to replace advice given to you by your health care provider. Make sure you discuss any questions you have with your health care provider. Document Revised: 09/09/2018 Document Reviewed: 04/01/2017 Elsevier Patient Education  2020 [employer].

## 2020-01-26 DIAGNOSIS — Z9181 History of falling: Secondary | ICD-10-CM | POA: Diagnosis not present

## 2020-01-26 DIAGNOSIS — F0390 Unspecified dementia without behavioral disturbance: Secondary | ICD-10-CM | POA: Diagnosis not present

## 2020-01-26 DIAGNOSIS — Z7901 Long term (current) use of anticoagulants: Secondary | ICD-10-CM | POA: Diagnosis not present

## 2020-01-26 DIAGNOSIS — H9193 Unspecified hearing loss, bilateral: Secondary | ICD-10-CM | POA: Diagnosis not present

## 2020-01-26 DIAGNOSIS — Z8673 Personal history of transient ischemic attack (TIA), and cerebral infarction without residual deficits: Secondary | ICD-10-CM | POA: Diagnosis not present

## 2020-01-26 DIAGNOSIS — R41841 Cognitive communication deficit: Secondary | ICD-10-CM | POA: Diagnosis not present

## 2020-01-31 NOTE — Progress Notes (Signed)
Reviewed patient chart and insurance adherence data. Patient does not have any gaps in adherence, no action required.

## 2020-02-14 ENCOUNTER — Ambulatory Visit (INDEPENDENT_AMBULATORY_CARE_PROVIDER_SITE_OTHER): Payer: Medicare HMO | Admitting: Podiatry

## 2020-02-14 ENCOUNTER — Other Ambulatory Visit: Payer: Self-pay

## 2020-02-14 ENCOUNTER — Encounter: Payer: Self-pay | Admitting: Podiatry

## 2020-02-14 DIAGNOSIS — B351 Tinea unguium: Secondary | ICD-10-CM

## 2020-02-14 DIAGNOSIS — M79675 Pain in left toe(s): Secondary | ICD-10-CM

## 2020-02-14 DIAGNOSIS — M79674 Pain in right toe(s): Secondary | ICD-10-CM | POA: Diagnosis not present

## 2020-02-14 DIAGNOSIS — Z7901 Long term (current) use of anticoagulants: Secondary | ICD-10-CM

## 2020-02-14 NOTE — Progress Notes (Signed)
This patient returns to my office for at risk foot care.  This patient requires this care by a professional since this patient will be at risk due to having coagulation defect.  This patient is unable to cut nails himself since the patient cannot reach his nails.These nails are painful walking and wearing shoes.  This patient presents for at risk foot care today.  He presents to the office with his wife.  General Appearance  Alert, conversant and in no acute stress.  Vascular  Dorsalis pedis and posterior tibial  pulses are palpable  bilaterally.  Capillary return is within normal limits  bilaterally. Temperature is within normal limits  bilaterally.  Neurologic  Senn-Weinstein monofilament wire test within normal limits  bilaterally. Muscle power within normal limits bilaterally.  Nails Thick disfigured discolored nails with subungual debris  from hallux to fifth toes bilaterally. No evidence of bacterial infection or drainage bilaterally.  Orthopedic  No limitations of motion  feet .  No crepitus or effusions noted.  No bony pathology or digital deformities noted.  Skin  normotropic skin with no porokeratosis noted bilaterally.  No signs of infections or ulcers noted.     Onychomycosis  Pain in right toes  Pain in left toes  Consent was obtained for treatment procedures.   Mechanical debridement of nails 1-5  bilaterally performed with a nail nipper.  Filed with dremel without incident.    Return office visit   3 months                   Told patient to return for periodic foot care and evaluation due to potential at risk complications.   Gardiner Barefoot DPM

## 2020-03-09 ENCOUNTER — Telehealth: Payer: Self-pay | Admitting: Cardiovascular Disease

## 2020-03-09 ENCOUNTER — Telehealth: Payer: Self-pay | Admitting: Internal Medicine

## 2020-03-09 MED ORDER — AMOXICILLIN-POT CLAVULANATE 875-125 MG PO TABS: 1.0000 | ORAL_TABLET | Freq: Two times a day (BID) | ORAL | 0 refills | Status: DC

## 2020-03-09 NOTE — Telephone Encounter (Signed)
Wife called again. The patient is still in a tremendous amount of pain after taking two tylenol at 10:30 am. The wife would like to know if there is anything else he can take for his tooth pain   Advised wife per recommendation from Nurse that for tooth pain the patient needs to contact the patient's Dentist. The patient will contact the Dentist

## 2020-03-09 NOTE — Telephone Encounter (Signed)
The patient' wife called back and spoke with our operator re: whether he can take more tylenol for his dental pain.  I adv that he should contact his primary doctor or dental provider re: what to do about the dental pain and also pain control/management for this issue.

## 2020-03-09 NOTE — Telephone Encounter (Signed)
Left message for patient with results

## 2020-03-09 NOTE — Telephone Encounter (Signed)
Patient's wife called wanting to know if he can take another Tylenol. She gave him one at 10am for his tooth pain.

## 2020-03-09 NOTE — Telephone Encounter (Signed)
    Spouse calling for advice. Patient has a rotten tooth, mouth pain. He has been taking Tylenol with no relief.  The dental office is closed.  What other medication can patient take?

## 2020-03-09 NOTE — Telephone Encounter (Signed)
Contact the dentist on Monday.  Continue Tylenol for pain.  [facility] until he is able to see the dentist.

## 2020-03-16 ENCOUNTER — Other Ambulatory Visit: Payer: Self-pay

## 2020-03-16 ENCOUNTER — Ambulatory Visit (INDEPENDENT_AMBULATORY_CARE_PROVIDER_SITE_OTHER): Payer: Medicare HMO | Admitting: *Deleted

## 2020-03-16 DIAGNOSIS — Z23 Encounter for immunization: Secondary | ICD-10-CM | POA: Diagnosis not present

## 2020-03-19 ENCOUNTER — Other Ambulatory Visit: Payer: Self-pay | Admitting: Internal Medicine

## 2020-04-04 DIAGNOSIS — K006 Disturbances in tooth eruption: Secondary | ICD-10-CM | POA: Diagnosis not present

## 2020-04-19 ENCOUNTER — Encounter: Payer: Self-pay | Admitting: Internal Medicine

## 2020-04-19 NOTE — Telephone Encounter (Signed)
No note needed 

## 2020-04-23 ENCOUNTER — Encounter: Payer: Self-pay | Admitting: Internal Medicine

## 2020-04-23 ENCOUNTER — Ambulatory Visit (INDEPENDENT_AMBULATORY_CARE_PROVIDER_SITE_OTHER): Payer: Medicare HMO | Admitting: Internal Medicine

## 2020-04-23 ENCOUNTER — Other Ambulatory Visit: Payer: Self-pay

## 2020-04-23 DIAGNOSIS — R413 Other amnesia: Secondary | ICD-10-CM | POA: Diagnosis not present

## 2020-04-23 DIAGNOSIS — I10 Essential (primary) hypertension: Secondary | ICD-10-CM | POA: Diagnosis not present

## 2020-04-23 NOTE — Progress Notes (Signed)
Subjective:    Patient ID: Henry Cotta., male    DOB: [birth year], 86 y.o.   MRN: [id number]  HPI The patient is here for an acute visit.  His wife was wondering if he would be eligible for hospice volunteers to  come and sit with him when she is gone.     Both deny any changes in his health.  He is eating well, sleeping ok and has no concerns.   His memory is not good - he can not be left alone.     Medications and allergies reviewed with patient and updated if appropriate.  Patient Active Problem List   Diagnosis Date Noted  . Pain due to onychomycosis of toenails of both feet 02/14/2020  . Bilateral impacted cerumen 09/14/2019  . Hearing loss 09/14/2019  . Memory difficulties 08/24/2019  . Frequent falls 08/11/2018  . Poor balance 08/11/2018  . Physical deconditioning 08/11/2018  . Neck pain 02/24/2017  . Cervical paraspinal muscle spasm 12/23/2016  . CAD (coronary artery disease) 12/03/2015  . Atrial fibrillation ([facility]) 11/21/2015  . STEMI (ST elevation myocardial infarction) ([facility]) 11/19/2015  . History of CVA (cerebrovascular accident) 05/31/2014  . Pulmonary embolism ([facility]) 05/20/2014  . HTN (hypertension) 05/20/2014  . Prostate cancer ([facility]) 04/05/2014  . Spinal stenosis of lumbar region 11/15/2013  . Falls 05/10/2013  . Hyperlipidemia   . Benign essential tremor   . Lumbago   . Palpitations   . Ingrown toenail without infection 09/07/2012    Current Outpatient Medications on File Prior to Visit  Medication Sig Dispense Refill  . acetaminophen (TYLENOL) 325 MG tablet Take 2 tablets (650 mg total) by mouth every 6 (six) hours as needed for mild pain, moderate pain or fever. 60 tablet 0  . apixaban (ELIQUIS) 5 MG TABS tablet Take 1 tablet (5 mg total) by mouth 2 (two) times daily. 180 tablet 3  . aspirin EC 81 MG tablet Take 1 tablet (81 mg total) by mouth daily. 90 tablet 3  . atorvastatin (LIPITOR) 40 MG tablet Take 1 tablet (40 mg total) by mouth daily. 90  tablet 2  . Cholecalciferol (VITAMIN D-3) 1000 UNITS CAPS Take 1 capsule by mouth daily. Take one tablet by mouth once daily.    [name] docusate sodium (COLACE) 100 MG capsule Take 100 mg by mouth daily.    [name] lisinopril (ZESTRIL) 2.5 MG tablet Take 1 tablet (2.5 mg total) by mouth daily. 90 tablet 2  . Multiple Vitamin (MULTIVITAMIN) tablet Take 1 tablet by mouth daily.    . nitroGLYCERIN (NITROSTAT) 0.4 MG SL tablet Place 1 tablet (0.4 mg total) under the tongue every 5 (five) minutes as needed for chest pain. 25 tablet 3   No current facility-administered medications on file prior to visit.    Past Medical History:  Diagnosis Date  . CAD (coronary artery disease)    a. STEMI 6/17: LHC - oLAD 10, mLAD 50, dLAD 20, D2 inf subbranch 70 + sup subbranch 100, pRCA 20, oRPDA 40, EF 35-45% >> PCI:  2.25 x 12 mm resolute integrity DES to the superior subbranch of D2   . Essential and other specified forms of tremor   . External hemorrhoids without mention of complication   . History of ST elevation myocardial infarction (STEMI)    a. Ant-Lat STEMI >> DES to D2 subbranch  . History of stroke   . History of TIA (transient ischemic attack)   . HTN (hypertension)   . Hyperlipidemia   .  Ischemic cardiomyopathy    a. Echo 6/17: Mid and distal anterior/inferior, septal and apical HK, mild concentric LVH, EF 30-35%, mild LAE  //  b. Echo 9/17 mild focal basal septal hypertrophy, EF 55-60%, normal wall motion, grade 1 diastolic dysfunction, mildly dilated aortic root (37 mm), normal RVSF, PASP 29 mmHg  . Lumbago   . Malignant neoplasm of prostate ([place])   . Nontraumatic rupture of tendons of biceps (long head)   . Paroxysmal atrial fibrillation ([facility])    during admit for STEMI 6/17 >> triple Rx high risk >> ASA + Plavix x 30 days post MI, then Eliquis + Plavix  . Sebaceous cyst   . Supraventricular premature beats   . Unspecified anomaly of tooth position     Past Surgical History:  Procedure Laterality  Date  . APPENDECTOMY  1942  . BACK SURGERY     with sciatica  . CARDIAC CATHETERIZATION N/A 11/19/2015   Procedure: Left Heart Cath and Coronary Angiography;  Surgeon: Burnell Blanks, MD;  Location: [facility];  Service: Cardiovascular;  Laterality: N/A;  . CARDIAC CATHETERIZATION N/A 11/19/2015   Procedure: Coronary Stent Intervention;  Surgeon: Burnell Blanks, MD;  Location: [facility];  Service: Cardiovascular;  Laterality: N/A;  . prostate cancer    . SPINE SURGERY N/A 2000   [name]    Social History   Socioeconomic History  . Marital status: Married    Spouse name: Not on file  . Number of children: 2  . Years of education: Not on file  . Highest education level: Not on file  Occupational History  . Occupation: retired  Tobacco Use  . Smoking status: Never Smoker  . Smokeless tobacco: Never Used  Vaping Use  . Vaping Use: Never used  Substance and Sexual Activity  . Alcohol use: No    Alcohol/week: 0.0 standard drinks  . Drug use: No  . Sexual activity: Not Currently  Other Topics Concern  . Not on file  Social History Narrative  . Not on file   Social Determinants of Health   Financial Resource Strain:   . Difficulty of Paying Living Expenses: Not on file  Food Insecurity:   . Worried About Charity fundraiser in the Last Year: Not on file  . Ran Out of Food in the Last Year: Not on file  Transportation Needs:   . Lack of Transportation (Medical): Not on file  . Lack of Transportation (Non-Medical): Not on file  Physical Activity:   . Days of Exercise per Week: Not on file  . Minutes of Exercise per Session: Not on file  Stress:   . Feeling of Stress : Not on file  Social Connections:   . Frequency of Communication with Friends and Family: Not on file  . Frequency of Social Gatherings with Friends and Family: Not on file  . Attends Religious Services: Not on file  . Active Member of Clubs or Organizations: Not on file  .  Attends Archivist Meetings: Not on file  . Marital Status: Not on file    Family History  Problem Relation Age of Onset  . Cancer Father 25       brain tumor    Review of Systems  Constitutional: Negative for appetite change (eating well).  Respiratory: Negative for cough, shortness of breath and wheezing.   Cardiovascular: Negative for chest pain, palpitations and leg swelling.  Gastrointestinal: Positive for constipation. Negative for abdominal pain.  Occ GERD  Neurological: Negative for light-headedness and headaches.  Psychiatric/Behavioral: Negative for sleep disturbance.       Objective:   Vitals:   04/23/20 1502  BP: 106/82  Pulse: 74  Temp: 98 F (36.7 C)  SpO2: 92%   BP Readings from Last 3 Encounters:  04/23/20 106/82  12/20/19 102/68  09/19/19 (!) 142/76   Wt Readings from Last 3 Encounters:  04/23/20 142 lb (64.4 kg)  12/20/19 141 lb (64 kg)  09/19/19 141 lb 12.8 oz (64.3 kg)   Body mass index is 20.37 kg/m.   Physical Exam    Constitutional: Appears well-developed and well-nourished. No distress.  Head: Normocephalic and atraumatic.  Neck: Neck supple. No tracheal deviation present. No thyromegaly present.  No cervical lymphadenopathy Cardiovascular: Normal rate, regular rhythm and normal heart sounds.  No murmur heard. No carotid bruit .  No edema Pulmonary/Chest: Effort normal and breath sounds normal. No respiratory distress. No has no wheezes. No rales.  Skin: Skin is warm and dry. Not diaphoretic.  Psychiatric: Normal mood and affect. Behavior is normal.       Assessment & Plan:    See Problem List for Assessment and Plan of chronic medical problems.    This visit occurred during the SARS-CoV-2 public health emergency.  Safety protocols were in place, including screening questions prior to the visit, additional usage of staff PPE, and extensive cleaning of exam room while observing appropriate contact time as indicated  for disinfecting solutions.

## 2020-04-23 NOTE — Assessment & Plan Note (Signed)
Chronic Can not be left alone - his wife would like to have someone come and watch him periodically so she can do what she needs to do - she wondered if he was eligible for hospice He is not hospice eligible  - he is medically very stable Having someone come would be out of pocket expense Advised to look into resources at the senior resources site of [employer]

## 2020-04-23 NOTE — Assessment & Plan Note (Signed)
Chronic BP well controlled Continue lisinopril 2.5 mg daily

## 2020-04-23 NOTE — Patient Instructions (Addendum)
Look at senior resources in [employer] --    [email or web address]  Or call them  [phone]

## 2020-05-15 ENCOUNTER — Encounter: Payer: Self-pay | Admitting: Podiatry

## 2020-05-15 ENCOUNTER — Other Ambulatory Visit: Payer: Self-pay

## 2020-05-15 ENCOUNTER — Ambulatory Visit: Payer: Medicare HMO | Admitting: Podiatry

## 2020-05-15 DIAGNOSIS — L600 Ingrowing nail: Secondary | ICD-10-CM

## 2020-05-15 DIAGNOSIS — Z7901 Long term (current) use of anticoagulants: Secondary | ICD-10-CM

## 2020-05-15 DIAGNOSIS — M79674 Pain in right toe(s): Secondary | ICD-10-CM | POA: Diagnosis not present

## 2020-05-15 DIAGNOSIS — B351 Tinea unguium: Secondary | ICD-10-CM | POA: Diagnosis not present

## 2020-05-15 DIAGNOSIS — M79675 Pain in left toe(s): Secondary | ICD-10-CM

## 2020-05-15 NOTE — Progress Notes (Signed)
This patient returns to my office for at risk foot care.  This patient requires this care by a professional since this patient will be at risk due to having coagulation defect.   Patient is taking eliquis. This patient is unable to cut nails himself since the patient cannot reach his nails.These nails are painful walking and wearing shoes.  This patient presents for at risk foot care today.  He presents to the office with his wife.  General Appearance  Alert, conversant and in no acute stress.  Vascular  Dorsalis pedis and posterior tibial  pulses are minimally  palpable  bilaterally.  Capillary return is within normal limits  bilaterally. Cold feetbilaterally.  Neurologic  Senn-Weinstein monofilament wire test within normal limits  bilaterally. Muscle power within normal limits bilaterally.  Nails Thick disfigured discolored nails with subungual debris  from hallux to fifth toes bilaterally. No evidence of bacterial infection or drainage bilaterally.  Orthopedic  No limitations of motion  feet .  No crepitus or effusions noted.  No bony pathology or digital deformities noted.  Skin  normotropic skin with no porokeratosis noted bilaterally.  No signs of infections or ulcers noted.     Onychomycosis  Pain in right toes  Pain in left toes  Consent was obtained for treatment procedures.   Mechanical debridement of nails 1-5  bilaterally performed with a nail nipper.  Filed with dremel without incident.    Return office visit   4 months                   Told patient to return for periodic foot care and evaluation due to potential at risk complications.   Gardiner Barefoot DPM

## 2020-07-03 ENCOUNTER — Telehealth: Payer: Self-pay | Admitting: Internal Medicine

## 2020-07-03 NOTE — Telephone Encounter (Signed)
LVM for pt to rtn my call to schedule awv with nha. Please schedule awv with nha if pt calls the office  

## 2020-08-06 ENCOUNTER — Other Ambulatory Visit: Payer: Self-pay

## 2020-08-06 ENCOUNTER — Ambulatory Visit (INDEPENDENT_AMBULATORY_CARE_PROVIDER_SITE_OTHER): Payer: Medicare HMO

## 2020-08-06 VITALS — BP 110/70 | HR 56 | Temp 98.4°F | Ht 70.0 in | Wt 144.2 lb

## 2020-08-06 DIAGNOSIS — Z0000 Encounter for general adult medical examination without abnormal findings: Secondary | ICD-10-CM | POA: Diagnosis not present

## 2020-08-06 NOTE — Patient Instructions (Addendum)
Mr. Henry Franklin , Thank you for taking time to come for your Medicare Wellness Visit. I appreciate your ongoing commitment to your health goals. Please review the following plan we discussed and let me know if I can assist you in the future.   Screening recommendations/referrals: Colonoscopy: not a candidate for colon cancer screening due to age Recommended yearly ophthalmology/optometry visit for glaucoma screening and checkup Recommended yearly dental visit for hygiene and checkup  Vaccinations: Influenza vaccine: 03/16/2020 Pneumococcal vaccine: 06/02/1998, 05/31/2014 Tdap vaccine: 06/24/2011; due every 10 years Shingles vaccine: never done; can check with local pharmacy for vaccine or [facility] Covid-19: 06/21/2019, 07/12/2019, 03/02/2020  Advanced directives: Please bring a copy of your health care power of attorney and living will to the office at your convenience.  Conditions/risks identified: Yes; Reviewed health maintenance screenings with patient today and relevant education, vaccines, and/or referrals were provided. Please continue to do your personal lifestyle choices by: daily care of teeth and gums, regular physical activity (goal should be 5 days a week for 30 minutes), eat a healthy diet, avoid tobacco and drug use, limiting any alcohol intake, taking a low-dose aspirin (if not allergic or have been advised by your provider otherwise) and taking vitamins and minerals as recommended by your provider. Continue doing brain stimulating activities (puzzles, reading, adult coloring books, staying active) to keep memory sharp. Continue to eat heart healthy diet (full of fruits, vegetables, whole grains, lean protein, water--limit salt, fat, and sugar intake) and increase physical activity as tolerated.  Next appointment: Please schedule your next Medicare Wellness Visit with your Nurse Health Advisor in 1 year by calling [phone].  Preventive Care 85 Years and Older, Male Preventive care  refers to lifestyle choices and visits with your health care provider that can promote health and wellness. What does preventive care include?  A yearly physical exam. This is also called an annual well check.  Dental exams once or twice a year.  Routine eye exams. Ask your health care provider how often you should have your eyes checked.  Personal lifestyle choices, including:  Daily care of your teeth and gums.  Regular physical activity.  Eating a healthy diet.  Avoiding tobacco and drug use.  Limiting alcohol use.  Practicing safe sex.  Taking low doses of aspirin every day.  Taking vitamin and mineral supplements as recommended by your health care provider. What happens during an annual well check? The services and screenings done by your health care provider during your annual well check will depend on your age, overall health, lifestyle risk factors, and family history of disease. Counseling  Your health care provider may ask you questions about your:  Alcohol use.  Tobacco use.  Drug use.  Emotional well-being.  Home and relationship well-being.  Sexual activity.  Eating habits.  History of falls.  Memory and ability to understand (cognition).  Work and work Statistician. Screening  You may have the following tests or measurements:  Height, weight, and BMI.  Blood pressure.  Lipid and cholesterol levels. These may be checked every 5 years, or more frequently if you are over 19 years old.  Skin check.  Lung cancer screening. You may have this screening every year starting at age 85 if you have a 30-pack-year history of smoking and currently smoke or have quit within the past 15 years.  Fecal occult blood test (FOBT) of the stool. You may have this test every year starting at age 85.  Flexible sigmoidoscopy or colonoscopy. You may have  a sigmoidoscopy every 5 years or a colonoscopy every 10 years starting at age 85.  Prostate cancer screening.  Recommendations will vary depending on your family history and other risks.  Hepatitis C blood test.  Hepatitis B blood test.  Sexually transmitted disease (STD) testing.  Diabetes screening. This is done by checking your blood sugar (glucose) after you have not eaten for a while (fasting). You may have this done every 1-3 years.  Abdominal aortic aneurysm (AAA) screening. You may need this if you are a current or former smoker.  Osteoporosis. You may be screened starting at age 42 if you are at high risk. Talk with your health care provider about your test results, treatment options, and if necessary, the need for more tests. Vaccines  Your health care provider may recommend certain vaccines, such as:  Influenza vaccine. This is recommended every year.  Tetanus, diphtheria, and acellular pertussis (Tdap, Td) vaccine. You may need a Td booster every 10 years.  Zoster vaccine. You may need this after age 85.  Pneumococcal 13-valent conjugate (PCV13) vaccine. One dose is recommended after age 26.  Pneumococcal polysaccharide (PPSV23) vaccine. One dose is recommended after age 64. Talk to your health care provider about which screenings and vaccines you need and how often you need them. This information is not intended to replace advice given to you by your health care provider. Make sure you discuss any questions you have with your health care provider. Document Released: 06/15/2015 Document Revised: 02/06/2016 Document Reviewed: 03/20/2015 Elsevier Interactive Patient Education  2017 [place] Prevention in the Home Falls can cause injuries. They can happen to people of all ages. There are many things you can do to make your home safe and to help prevent falls. What can I do on the outside of my home?  Regularly fix the edges of walkways and driveways and fix any cracks.  Remove anything that might make you trip as you walk through a door, such as a raised step or  threshold.  Trim any bushes or trees on the path to your home.  Use bright outdoor lighting.  Clear any walking paths of anything that might make someone trip, such as rocks or tools.  Regularly check to see if handrails are loose or broken. Make sure that both sides of any steps have handrails.  Any raised decks and porches should have guardrails on the edges.  Have any leaves, snow, or ice cleared regularly.  Use sand or salt on walking paths during winter.  Clean up any spills in your garage right away. This includes oil or grease spills. What can I do in the bathroom?  Use night lights.  Install grab bars by the toilet and in the tub and shower. Do not use towel bars as grab bars.  Use non-skid mats or decals in the tub or shower.  If you need to sit down in the shower, use a plastic, non-slip stool.  Keep the floor dry. Clean up any water that spills on the floor as soon as it happens.  Remove soap buildup in the tub or shower regularly.  Attach bath mats securely with double-sided non-slip rug tape.  Do not have throw rugs and other things on the floor that can make you trip. What can I do in the bedroom?  Use night lights.  Make sure that you have a light by your bed that is easy to reach.  Do not use any sheets or blankets that  are too big for your bed. They should not hang down onto the floor.  Have a firm chair that has side arms. You can use this for support while you get dressed.  Do not have throw rugs and other things on the floor that can make you trip. What can I do in the kitchen?  Clean up any spills right away.  Avoid walking on wet floors.  Keep items that you use a lot in easy-to-reach places.  If you need to reach something above you, use a strong step stool that has a grab bar.  Keep electrical cords out of the way.  Do not use floor polish or wax that makes floors slippery. If you must use wax, use non-skid floor wax.  Do not have  throw rugs and other things on the floor that can make you trip. What can I do with my stairs?  Do not leave any items on the stairs.  Make sure that there are handrails on both sides of the stairs and use them. Fix handrails that are broken or loose. Make sure that handrails are as long as the stairways.  Check any carpeting to make sure that it is firmly attached to the stairs. Fix any carpet that is loose or worn.  Avoid having throw rugs at the top or bottom of the stairs. If you do have throw rugs, attach them to the floor with carpet tape.  Make sure that you have a light switch at the top of the stairs and the bottom of the stairs. If you do not have them, ask someone to add them for you. What else can I do to help prevent falls?  Wear shoes that:  Do not have high heels.  Have rubber bottoms.  Are comfortable and fit you well.  Are closed at the toe. Do not wear sandals.  If you use a stepladder:  Make sure that it is fully opened. Do not climb a closed stepladder.  Make sure that both sides of the stepladder are locked into place.  Ask someone to hold it for you, if possible.  Clearly mark and make sure that you can see:  Any grab bars or handrails.  First and last steps.  Where the edge of each step is.  Use tools that help you move around (mobility aids) if they are needed. These include:  Canes.  Walkers.  Scooters.  Crutches.  Turn on the lights when you go into a dark area. Replace any light bulbs as soon as they burn out.  Set up your furniture so you have a clear path. Avoid moving your furniture around.  If any of your floors are uneven, fix them.  If there are any pets around you, be aware of where they are.  Review your medicines with your doctor. Some medicines can make you feel dizzy. This can increase your chance of falling. Ask your doctor what other things that you can do to help prevent falls. This information is not intended to  replace advice given to you by your health care provider. Make sure you discuss any questions you have with your health care provider. Document Released: 03/15/2009 Document Revised: 10/25/2015 Document Reviewed: 06/23/2014 Elsevier Interactive Patient Education  2017 [employer].

## 2020-08-06 NOTE — Progress Notes (Signed)
Subjective:   Henry Franklin. is a 87 y.o. male who presents for Medicare Annual/Subsequent preventive examination.  Review of Systems    No ROS. Medicare Wellness Visit. Additional risk factors are reflected in social history. Cardiac Risk Factors include: advanced age (>59men, >76 women);dyslipidemia;male gender     Objective:    Today's Vitals   08/06/20 1452  BP: 110/70  Pulse: (!) 56  Temp: 98.4 F (36.9 C)  SpO2: 98%  Weight: 144 lb 3.2 oz (65.4 kg)  Height: 5\' 10"  (1.778 m)  PainSc: 0-No pain   Body mass index is 20.69 kg/m.  Advanced Directives 08/06/2020 03/28/2019 02/26/2018 02/24/2017 06/19/2016 01/22/2016 01/02/2016  Does Patient Have a Medical Advance Directive? Yes Yes No Yes No Yes Yes  Type of Paramedic of [place];Living will [place];Living will - [place];Living will - - (No Data)  Does patient want to make changes to medical advance directive? No - Patient declined - Yes (ED - Information included in AVS) - - - Yes - information given  Copy of [place] in Chart? No - copy requested No - copy requested - No - copy requested - - -  Would patient like information on creating a medical advance directive? - - - - - - -    Current Medications (verified) Outpatient Encounter Medications as of 08/06/2020  Medication Sig  . acetaminophen (TYLENOL) 325 MG tablet Take 2 tablets (650 mg total) by mouth every 6 (six) hours as needed for mild pain, moderate pain or fever.  [name] apixaban (ELIQUIS) 5 MG TABS tablet Take 1 tablet (5 mg total) by mouth 2 (two) times daily.  [name] aspirin EC 81 MG tablet Take 1 tablet (81 mg total) by mouth daily.  [name] atorvastatin (LIPITOR) 40 MG tablet Take 1 tablet (40 mg total) by mouth daily.  . Cholecalciferol (VITAMIN D-3) 1000 UNITS CAPS Take 1 capsule by mouth daily. Take one tablet by mouth once daily.  [name] docusate sodium (COLACE) 100 MG capsule Take 100 mg by  mouth daily.  [name] lisinopril (ZESTRIL) 2.5 MG tablet Take 1 tablet (2.5 mg total) by mouth daily.  . Multiple Vitamin (MULTIVITAMIN) tablet Take 1 tablet by mouth daily.  . nitroGLYCERIN (NITROSTAT) 0.4 MG SL tablet Place 1 tablet (0.4 mg total) under the tongue every 5 (five) minutes as needed for chest pain.   No facility-administered encounter medications on file as of 08/06/2020.    Allergies (verified) Patient has no known allergies.   History: Past Medical History:  Diagnosis Date  . CAD (coronary artery disease)    a. STEMI 6/17: LHC - oLAD 10, mLAD 50, dLAD 20, D2 inf subbranch 70 + sup subbranch 100, pRCA 20, oRPDA 40, EF 35-45% >> PCI:  2.25 x 12 mm resolute integrity DES to the superior subbranch of D2   . Essential and other specified forms of tremor   . External hemorrhoids without mention of complication   . History of ST elevation myocardial infarction (STEMI)    a. Ant-Lat STEMI >> DES to D2 subbranch  . History of stroke   . History of TIA (transient ischemic attack)   . HTN (hypertension)   . Hyperlipidemia   . Ischemic cardiomyopathy    a. Echo 6/17: Mid and distal anterior/inferior, septal and apical HK, mild concentric LVH, EF 30-35%, mild LAE  //  b. Echo 9/17 mild focal basal septal hypertrophy, EF 55-60%, normal wall motion,  grade 1 diastolic dysfunction, mildly dilated aortic root (37 mm), normal RVSF, PASP 29 mmHg  . Lumbago   . Malignant neoplasm of prostate ([place])   . Nontraumatic rupture of tendons of biceps (long head)   . Paroxysmal atrial fibrillation (HCC)    during admit for STEMI 6/17 >> triple Rx high risk >> ASA + Plavix x 30 days post MI, then Eliquis + Plavix  . Sebaceous cyst   . Supraventricular premature beats   . Unspecified anomaly of tooth position    Past Surgical History:  Procedure Laterality Date  . APPENDECTOMY  1942  . BACK SURGERY     with sciatica  . CARDIAC CATHETERIZATION N/A 11/19/2015   Procedure: Left Heart Cath and Coronary  Angiography;  Surgeon: Burnell Blanks, MD;  Location: [facility];  Service: Cardiovascular;  Laterality: N/A;  . CARDIAC CATHETERIZATION N/A 11/19/2015   Procedure: Coronary Stent Intervention;  Surgeon: Burnell Blanks, MD;  Location: [facility];  Service: Cardiovascular;  Laterality: N/A;  . prostate cancer    . SPINE SURGERY N/A 2000   [place]   Family History  Problem Relation Age of Onset  . Cancer Father 26       brain tumor   Social History   Socioeconomic History  . Marital status: Married    Spouse name: Not on file  . Number of children: 2  . Years of education: Not on file  . Highest education level: Not on file  Occupational History  . Occupation: retired  Tobacco Use  . Smoking status: Never Smoker  . Smokeless tobacco: Never Used  Vaping Use  . Vaping Use: Never used  Substance and Sexual Activity  . Alcohol use: No    Alcohol/week: 0.0 standard drinks  . Drug use: No  . Sexual activity: Not Currently  Other Topics Concern  . Not on file  Social History Narrative  . Not on file   Social Determinants of Health   Financial Resource Strain: Low Risk   . Difficulty of Paying Living Expenses: Not hard at all  Food Insecurity: No Food Insecurity  . Worried About Charity fundraiser in the Last Year: Never true  . Ran Out of Food in the Last Year: Never true  Transportation Needs: No Transportation Needs  . Lack of Transportation (Medical): No  . Lack of Transportation (Non-Medical): No  Physical Activity: Inactive  . Days of Exercise per Week: 0 days  . Minutes of Exercise per Session: 0 min  Stress: No Stress Concern Present  . Feeling of Stress : Not at all  Social Connections: Socially Integrated  . Frequency of Communication with Friends and Family: More than three times a week  . Frequency of Social Gatherings with Friends and Family: Once a week  . Attends Religious Services: More than 4 times per year  . Active Member  of Clubs or Organizations: Yes  . Attends Archivist Meetings: More than 4 times per year  . Marital Status: Married    Tobacco Counseling Counseling given: Not Answered   Clinical Intake:  Pre-visit preparation completed: Yes  Pain : No/denies pain Pain Score: 0-No pain     BMI - recorded: 20.69 Nutritional Status: BMI of 19-24  Normal Nutritional Risks: None Diabetes: No  How often do you need to have someone help you when you read instructions, pamphlets, or other written materials from your doctor or pharmacy?: 1 - Never What is the last  grade level you completed in school?: Bachelor's Degree in Chemistry; Retired [employer]  Diabetic? no  Interpreter Needed?: No  Information entered by :: Lisette Abu, LPN   Activities of Daily Living In your present state of health, do you have any difficulty performing the following activities: 08/06/2020 04/23/2020  Hearing? N Y  Vision? N N  Difficulty concentrating or making decisions? Tempie Donning  Walking or climbing stairs? Y Y  Dressing or bathing? Y Y  Doing errands, shopping? Tempie Donning  Preparing Food and eating ? Y -  Using the Toilet? Y -  In the past six months, have you accidently leaked urine? Y -  Do you have problems with loss of bowel control? Y -  Managing your Medications? Y -  Managing your Finances? Y -  Housekeeping or managing your Housekeeping? Y -  Some recent data might be hidden    Patient Care Team: [name], MD as PCP - General (Internal Medicine) Burnell Blanks, MD as PCP - Cardiology (Cardiology) Earnie Larsson, MD as Consulting Physician (Neurosurgery) Darlin Coco, MD as Consulting Physician (Cardiology) Irene Shipper, MD as Consulting Physician (Gastroenterology) Lynne Logan, MD as Referring Physician (Urology) Rutherford Guys, MD as Consulting Physician (Ophthalmology) Allyn Kenner, MD (Dermatology) Charlton Haws, [facility] as Pharmacist (Pharmacist)  Indicate any  recent Medical Services you may have received from other than [facility] providers in the past year (date may be approximate).     Assessment:   This is a routine wellness examination for [place].  Hearing/Vision screen No exam data present  Dietary issues and exercise activities discussed: Current Exercise Habits: The patient does not participate in regular exercise at present, Exercise limited by: cardiac condition(s);neurologic condition(s);orthopedic condition(s)  Goals    . Pharmacy Care Plan     Current Barriers:  . Chronic Disease Management support, education, and care coordination needs related to CAD, HTN, and HLD, Afib/PE  Pharmacist Clinical Goal(s):  [name] Maintain BP between 110/60 and 130/80 . Improve ease of medication administration   Interventions: . Comprehensive medication review performed. [name] Utilize [facility] for medication packaging and delivery  Patient Self Care Activities:  . Wife administers medications as prescribed, Calls pharmacy for medication refills, and Calls provider office for new concerns or questions  Initial goal documentation      Depression Screen PHQ 2/9 Scores 08/06/2020 04/23/2020 03/28/2019 02/26/2018 02/24/2017 04/28/2016 01/02/2016  PHQ - 2 Score 0 1 1 0 0 0 0  PHQ- 9 Score - 4 - - 1 - -    Fall Risk Fall Risk  08/06/2020 09/14/2019 03/28/2019 02/26/2018 02/24/2017  Falls in the past year? 0 1 1 Yes Yes  Comment - - - - -  Number falls in past yr: 0 1 1 2  or more 1  Comment - - - - -  Injury with Fall? 0 1 0 - No  Comment - - - - -  Risk Factor Category  - - - High Fall Risk -  Risk for fall due to : Impaired balance/gait;History of fall(s) - Impaired mobility;Impaired balance/gait;History of fall(s) Impaired mobility;Impaired balance/gait Impaired mobility  Risk for fall due to: Comment - - - - -  Follow up Falls evaluation completed Falls evaluation completed - Education provided;Falls prevention discussed Falls prevention discussed     FALL RISK PREVENTION PERTAINING TO THE HOME:  Any stairs in or around the home? No  If so, are there any without handrails? No  Home free of loose throw rugs  in walkways, pet beds, electrical cords, etc? Yes  Adequate lighting in your home to reduce risk of falls? Yes   ASSISTIVE DEVICES UTILIZED TO PREVENT FALLS:  Life alert? No  Use of a cane, walker or w/c? Yes  Grab bars in the bathroom? Yes  Shower chair or bench in shower? Yes  Elevated toilet seat or a handicapped toilet? Yes   TIMED UP AND GO:  Was the test performed? No .  Length of time to ambulate 10 feet: 0 sec.   Gait steady and fast with assistive device  Cognitive Function: MMSE - Mini Mental State Exam 02/26/2018 02/24/2017 04/05/2014  Orientation to time 5 5 4   Orientation to Place 4 5 5   Registration 3 3 3   Attention/ Calculation 5 3 5   Recall 0 2 1  Language- name 2 objects 2 2 2   Language- repeat 1 1 1   Language- follow 3 step command 3 3 3   Language- read & follow direction 1 1 1   Write a sentence 1 1 1   Copy design 1 1 1   Total score 26 27 27      6CIT Screen 03/28/2019  What Year? 0 points  What month? 0 points  What time? 0 points  Count back from 20 0 points  Months in reverse 0 points  Repeat phrase 2 points  Total Score 2    Immunizations Immunization History  Administered Date(s) Administered  . Fluad Quad(high Dose 65+) 02/11/2019, 03/16/2020  . Influenza, High Dose Seasonal PF 03/05/2016, 02/24/2017, 02/26/2018  . Influenza-Unspecified 02/07/2009, 03/02/2013, 03/24/2014, 03/03/2015  . PFIZER(Purple Top)SARS-COV-2 Vaccination 06/21/2019, 07/12/2019, 03/02/2020  . Pneumococcal Conjugate-13 05/31/2014  . Pneumococcal Polysaccharide-23 06/02/1998  . Tdap 06/24/2011  . Zoster 06/25/2006    TDAP status: Up to date  Flu Vaccine status: Up to date  Pneumococcal vaccine status: Up to date  Covid-19 vaccine status: Completed vaccines  Qualifies for Shingles Vaccine? Yes    Zostavax completed Yes   Shingrix Completed?: No.    Education has been provided regarding the importance of this vaccine. Patient has been advised to call insurance company to determine out of pocket expense if they have not yet received this vaccine. Advised may also receive vaccine at local pharmacy or Health Dept. Verbalized acceptance and understanding.  Screening Tests Health Maintenance  Topic Date Due  . COVID-19 Vaccine (4 - Booster for Pfizer series) 08/31/2020  . TETANUS/TDAP  06/23/2021  . INFLUENZA VACCINE  Completed  . PNA vac Low Risk Adult  Completed  . HPV VACCINES  Aged Out    Health Maintenance  There are no preventive care reminders to display for this patient.  Colorectal cancer screening: No longer required.   Lung Cancer Screening: (Low Dose CT Chest recommended if Age 48-80 years, 30 pack-year currently smoking OR have quit w/in 15years.) does not qualify.   Lung Cancer Screening Referral: no  Additional Screening:  Hepatitis C Screening: does not qualify; Completed no  Vision Screening: Recommended annual ophthalmology exams for early detection of glaucoma and other disorders of the eye. Is the patient up to date with their annual eye exam?  Yes  Who is the provider or what is the name of the office in which the patient attends annual eye exams? Rutherford Guys, MD. If pt is not established with a provider, would they like to be referred to a provider to establish care? No .   Dental Screening: Recommended annual dental exams for proper oral hygiene  Community Resource Referral /  Chronic Care Management: CRR required this visit?  No   CCM required this visit?  No      Plan:     I have personally reviewed and noted the following in the patient's chart:   . Medical and social history . Use of alcohol, tobacco or illicit drugs  . Current medications and supplements . Functional ability and status . Nutritional status . Physical  activity . Advanced directives . List of other physicians . Hospitalizations, surgeries, and ER visits in previous 12 months . Vitals . Screenings to include cognitive, depression, and falls . Referrals and appointments  In addition, I have reviewed and discussed with patient certain preventive protocols, quality metrics, and best practice recommendations. A written personalized care plan for preventive services as well as general preventive health recommendations were provided to patient.     Sheral Flow, LPN   0/06/930   Nurse Notes:  Medications reviewed with patient; no opioid use noted. Normal cognitive status assessed by direct observation by this Nurse Health Advisor. No abnormalities found.

## 2020-08-20 ENCOUNTER — Other Ambulatory Visit: Payer: Self-pay | Admitting: *Deleted

## 2020-08-20 ENCOUNTER — Other Ambulatory Visit: Payer: Self-pay | Admitting: Cardiovascular Disease

## 2020-08-20 NOTE — Telephone Encounter (Signed)
[name] called to request for Lisinopril.  She was aware that it had already been sent to his pharmacy. She was very appreciative for the information.

## 2020-09-09 ENCOUNTER — Other Ambulatory Visit: Payer: Self-pay | Admitting: Cardiovascular Disease

## 2020-09-18 ENCOUNTER — Ambulatory Visit: Payer: Medicare HMO | Admitting: Podiatry

## 2020-09-18 ENCOUNTER — Other Ambulatory Visit: Payer: Self-pay

## 2020-09-18 DIAGNOSIS — M79674 Pain in right toe(s): Secondary | ICD-10-CM

## 2020-09-18 DIAGNOSIS — M79675 Pain in left toe(s): Secondary | ICD-10-CM

## 2020-09-18 DIAGNOSIS — B351 Tinea unguium: Secondary | ICD-10-CM

## 2020-09-18 DIAGNOSIS — Z7901 Long term (current) use of anticoagulants: Secondary | ICD-10-CM

## 2020-09-18 NOTE — Progress Notes (Signed)
This patient returns to my office for at risk foot care.  This patient requires this care by a professional since this patient will be at risk due to having coagulation defect.   Patient is taking eliquis. This patient is unable to cut nails himself since the patient cannot reach his nails.These nails are painful walking and wearing shoes.  This patient presents for at risk foot care today.  He presents to the office with his wife.  General Appearance  Alert, conversant and in no acute stress.  Vascular  Dorsalis pedis and posterior tibial  pulses are minimally  palpable  bilaterally.  Capillary return is within normal limits  bilaterally. Cold feetbilaterally.  Neurologic  Senn-Weinstein monofilament wire test within normal limits  bilaterally. Muscle power within normal limits bilaterally.  Nails Thick disfigured discolored nails with subungual debris  from hallux to fifth toes bilaterally. No evidence of bacterial infection or drainage bilaterally.  Orthopedic  No limitations of motion  feet .  No crepitus or effusions noted.  No bony pathology or digital deformities noted.  Skin  normotropic skin with no porokeratosis noted bilaterally.  No signs of infections or ulcers noted.     Onychomycosis  Pain in right toes  Pain in left toes  Consent was obtained for treatment procedures.   Mechanical debridement of nails 1-5  bilaterally performed with a nail nipper.  Filed with dremel without incident. Padding added fifth toe right foot.   Return office visit   4 months                   Told patient to return for periodic foot care and evaluation due to potential at risk complications.   Gardiner Barefoot DPM

## 2020-10-04 ENCOUNTER — Telehealth: Payer: Self-pay | Admitting: Internal Medicine

## 2020-10-04 NOTE — Telephone Encounter (Signed)
Patients wife called and was wondering if lab orders could be placed. She can be reached at [phone]. Please advise

## 2020-10-04 NOTE — Progress Notes (Signed)
Subjective:    Patient ID: Henry Franklin., male    DOB: 27-Apr-1927, 87 y.o.   MRN: [id number]  HPI The patient is here for follow up of their chronic medical problems, including CAD, Afib, htn, hyperlipidemia, memory difficulties.  He is here with his wife.    He has some back pain and uses a walker.  No falls.  He has no concerns.      Medications and allergies reviewed with patient and updated if appropriate.  Patient Active Problem List   Diagnosis Date Noted  . Pain due to onychomycosis of toenails of both feet 02/14/2020  . Bilateral impacted cerumen 09/14/2019  . Hearing loss 09/14/2019  . Memory difficulties 08/24/2019  . Frequent falls 08/11/2018  . Poor balance 08/11/2018  . Physical deconditioning 08/11/2018  . Neck pain 02/24/2017  . Cervical paraspinal muscle spasm 12/23/2016  . CAD (coronary artery disease) 12/03/2015  . Atrial fibrillation ([place]) 11/21/2015  . STEMI (ST elevation myocardial infarction) ([place]) 11/19/2015  . History of CVA (cerebrovascular accident) 05/31/2014  . Pulmonary embolism ([place]) 05/20/2014  . HTN (hypertension) 05/20/2014  . Prostate cancer ([place]) 04/05/2014  . Spinal stenosis of lumbar region 11/15/2013  . Hyperlipidemia   . Benign essential tremor   . Lumbago   . Palpitations     Current Outpatient Medications on File Prior to Visit  Medication Sig Dispense Refill  . acetaminophen (TYLENOL) 325 MG tablet Take 2 tablets (650 mg total) by mouth every 6 (six) hours as needed for mild pain, moderate pain or fever. 60 tablet 0  . apixaban (ELIQUIS) 5 MG TABS tablet Take 1 tablet (5 mg total) by mouth 2 (two) times daily. 180 tablet 3  . aspirin EC 81 MG tablet Take 1 tablet (81 mg total) by mouth daily. 90 tablet 3  . atorvastatin (LIPITOR) 40 MG tablet Take 1 tablet (40 mg total) by mouth daily. Please keep upcoming appt for further refills 90 tablet 0  . Cholecalciferol (VITAMIN D-3) 1000 UNITS CAPS Take 1 capsule by mouth daily. Take  one tablet by mouth once daily.    [name] docusate sodium (COLACE) 100 MG capsule Take 100 mg by mouth daily.    [name] lisinopril (ZESTRIL) 2.5 MG tablet TAKE 1 TABLET BY MOUTH EVERY DAY 90 tablet 1  . Multiple Vitamin (MULTIVITAMIN) tablet Take 1 tablet by mouth daily.    . nitroGLYCERIN (NITROSTAT) 0.4 MG SL tablet Place 1 tablet (0.4 mg total) under the tongue every 5 (five) minutes as needed for chest pain. 25 tablet 3   No current facility-administered medications on file prior to visit.    Past Medical History:  Diagnosis Date  . CAD (coronary artery disease)    a. STEMI 6/17: LHC - oLAD 10, mLAD 50, dLAD 20, D2 inf subbranch 70 + sup subbranch 100, pRCA 20, oRPDA 40, EF 35-45% >> PCI:  2.25 x 12 mm resolute integrity DES to the superior subbranch of D2   . Essential and other specified forms of tremor   . External hemorrhoids without mention of complication   . History of ST elevation myocardial infarction (STEMI)    a. Ant-Lat STEMI >> DES to D2 subbranch  . History of stroke   . History of TIA (transient ischemic attack)   . HTN (hypertension)   . Hyperlipidemia   . Ischemic cardiomyopathy    a. Echo 6/17: Mid and distal anterior/inferior, septal and apical HK, mild concentric LVH, EF 30-35%, mild LAE  //  b. Echo 9/17 mild focal basal septal hypertrophy, EF 55-60%, normal wall motion, grade 1 diastolic dysfunction, mildly dilated aortic root (37 mm), normal RVSF, PASP 29 mmHg  . Lumbago   . Malignant neoplasm of prostate ([place])   . Nontraumatic rupture of tendons of biceps (long head)   . Paroxysmal atrial fibrillation ([facility])    during admit for STEMI 6/17 >> triple Rx high risk >> ASA + Plavix x 30 days post MI, then Eliquis + Plavix  . Sebaceous cyst   . Supraventricular premature beats   . Unspecified anomaly of tooth position     Past Surgical History:  Procedure Laterality Date  . APPENDECTOMY  1942  . BACK SURGERY     with sciatica  . CARDIAC CATHETERIZATION N/A 11/19/2015    Procedure: Left Heart Cath and Coronary Angiography;  Surgeon: Burnell Blanks, MD;  Location: [facility];  Service: Cardiovascular;  Laterality: N/A;  . CARDIAC CATHETERIZATION N/A 11/19/2015   Procedure: Coronary Stent Intervention;  Surgeon: Burnell Blanks, MD;  Location: [place] [facility];  Service: Cardiovascular;  Laterality: N/A;  . prostate cancer    . SPINE SURGERY N/A 2000   [name]    Social History   Socioeconomic History  . Marital status: Married    Spouse name: Not on file  . Number of children: 2  . Years of education: Not on file  . Highest education level: Not on file  Occupational History  . Occupation: retired  Tobacco Use  . Smoking status: Never Smoker  . Smokeless tobacco: Never Used  Vaping Use  . Vaping Use: Never used  Substance and Sexual Activity  . Alcohol use: No    Alcohol/week: 0.0 standard drinks  . Drug use: No  . Sexual activity: Not Currently  Other Topics Concern  . Not on file  Social History Narrative  . Not on file   Social Determinants of Health   Financial Resource Strain: Low Risk   . Difficulty of Paying Living Expenses: Not hard at all  Food Insecurity: No Food Insecurity  . Worried About Charity fundraiser in the Last Year: Never true  . Ran Out of Food in the Last Year: Never true  Transportation Needs: No Transportation Needs  . Lack of Transportation (Medical): No  . Lack of Transportation (Non-Medical): No  Physical Activity: Inactive  . Days of Exercise per Week: 0 days  . Minutes of Exercise per Session: 0 min  Stress: No Stress Concern Present  . Feeling of Stress : Not at all  Social Connections: Socially Integrated  . Frequency of Communication with Friends and Family: More than three times a week  . Frequency of Social Gatherings with Friends and Family: Once a week  . Attends Religious Services: More than 4 times per year  . Active Member of Clubs or Organizations: Yes  . Attends  Archivist Meetings: More than 4 times per year  . Marital Status: Married    Family History  Problem Relation Age of Onset  . Cancer Father 64       brain tumor    Review of Systems  Constitutional: Negative for appetite change and fever.  Respiratory: Positive for cough (occ). Negative for shortness of breath and wheezing.   Cardiovascular: Negative for chest pain, palpitations and leg swelling.  Gastrointestinal: Negative for abdominal pain.  Musculoskeletal: Positive for gait problem.  Neurological: Negative for light-headedness and headaches.  Hematological: Does not bruise/bleed easily.  Objective:   Vitals:   10/05/20 1320  BP: 106/60  Pulse: 60  Temp: 97.9 F (36.6 C)  SpO2: 97%   BP Readings from Last 3 Encounters:  10/05/20 106/60  08/06/20 110/70  04/23/20 106/82   Wt Readings from Last 3 Encounters:  10/05/20 142 lb (64.4 kg)  08/06/20 144 lb 3.2 oz (65.4 kg)  04/23/20 142 lb (64.4 kg)   Body mass index is 20.37 kg/m.   Physical Exam    Constitutional: Appears well-developed and well-nourished. No distress.  HENT:  Head: Normocephalic and atraumatic.  Neck: Neck supple. No tracheal deviation present. No thyromegaly present.  No cervical lymphadenopathy Cardiovascular: Normal rate, regular rhythm and normal heart sounds.   No murmur heard. No carotid bruit .  No edema Pulmonary/Chest: Effort normal and breath sounds normal. No respiratory distress. No has no wheezes. No rales.  Abdomen: soft, NT, ND Skin: Skin is warm and dry. Not diaphoretic.  Psychiatric: Normal mood and affect. Behavior is normal.      Assessment & Plan:    I spent 20 minutes dedicated to the care of this patient on the date of this encounter including review of last labs, imaging and procedures, speciality notes, obtaining history, communicating with the patient, ordering medications, tests, and documenting clinical information in the EHR    See Problem  List for Assessment and Plan of chronic medical problems.    This visit occurred during the SARS-CoV-2 public health emergency.  Safety protocols were in place, including screening questions prior to the visit, additional usage of staff PPE, and extensive cleaning of exam room while observing appropriate contact time as indicated for disinfecting solutions.

## 2020-10-04 NOTE — Patient Instructions (Addendum)
    Blood work was ordered.      Medications changes include :   none     Please followup in 6 months  

## 2020-10-04 NOTE — Telephone Encounter (Signed)
Appointment made for patient to come in since we hadn't seen him since 11/21.

## 2020-10-05 ENCOUNTER — Ambulatory Visit (INDEPENDENT_AMBULATORY_CARE_PROVIDER_SITE_OTHER): Payer: Medicare HMO | Admitting: Internal Medicine

## 2020-10-05 ENCOUNTER — Other Ambulatory Visit: Payer: Self-pay

## 2020-10-05 ENCOUNTER — Encounter: Payer: Self-pay | Admitting: Internal Medicine

## 2020-10-05 ENCOUNTER — Telehealth: Payer: Self-pay | Admitting: Internal Medicine

## 2020-10-05 VITALS — BP 106/60 | HR 60 | Temp 97.9°F | Ht 70.0 in | Wt 142.0 lb

## 2020-10-05 DIAGNOSIS — E782 Mixed hyperlipidemia: Secondary | ICD-10-CM

## 2020-10-05 DIAGNOSIS — I10 Essential (primary) hypertension: Secondary | ICD-10-CM

## 2020-10-05 DIAGNOSIS — I480 Paroxysmal atrial fibrillation: Secondary | ICD-10-CM

## 2020-10-05 DIAGNOSIS — M48061 Spinal stenosis, lumbar region without neurogenic claudication: Secondary | ICD-10-CM

## 2020-10-05 DIAGNOSIS — I25118 Atherosclerotic heart disease of native coronary artery with other forms of angina pectoris: Secondary | ICD-10-CM | POA: Diagnosis not present

## 2020-10-05 DIAGNOSIS — R296 Repeated falls: Secondary | ICD-10-CM

## 2020-10-05 DIAGNOSIS — R413 Other amnesia: Secondary | ICD-10-CM

## 2020-10-05 LAB — COMPREHENSIVE METABOLIC PANEL WITH GFR
ALT: 18 U/L (ref 0–53)
AST: 21 U/L (ref 0–37)
Albumin: 3.6 g/dL (ref 3.5–5.2)
Alkaline Phosphatase: 60 U/L (ref 39–117)
BUN: 25 mg/dL — ABNORMAL HIGH (ref 6–23)
CO2: 31 meq/L (ref 19–32)
Calcium: 9.2 mg/dL (ref 8.4–10.5)
Chloride: 102 meq/L (ref 96–112)
Creatinine, Ser: 1.06 mg/dL (ref 0.40–1.50)
GFR: 61.26 mL/min
Glucose, Bld: 86 mg/dL (ref 70–99)
Potassium: 4.7 meq/L (ref 3.5–5.1)
Sodium: 139 meq/L (ref 135–145)
Total Bilirubin: 0.6 mg/dL (ref 0.2–1.2)
Total Protein: 6 g/dL (ref 6.0–8.3)

## 2020-10-05 LAB — CBC WITH DIFFERENTIAL/PLATELET
Basophils Absolute: 0 K/uL (ref 0.0–0.1)
Basophils Relative: 0.5 % (ref 0.0–3.0)
Eosinophils Absolute: 0.1 K/uL (ref 0.0–0.7)
Eosinophils Relative: 1.9 % (ref 0.0–5.0)
HCT: 48.6 % (ref 39.0–52.0)
Hemoglobin: 15.9 g/dL (ref 13.0–17.0)
Lymphocytes Relative: 22.9 % (ref 12.0–46.0)
Lymphs Abs: 1.4 K/uL (ref 0.7–4.0)
MCHC: 32.8 g/dL (ref 30.0–36.0)
MCV: 93.9 fl (ref 78.0–100.0)
Monocytes Absolute: 0.7 K/uL (ref 0.1–1.0)
Monocytes Relative: 12.4 % — ABNORMAL HIGH (ref 3.0–12.0)
Neutro Abs: 3.7 K/uL (ref 1.4–7.7)
Neutrophils Relative %: 62.3 % (ref 43.0–77.0)
Platelets: 172 K/uL (ref 150.0–400.0)
RBC: 5.17 Mil/uL (ref 4.22–5.81)
RDW: 14 % (ref 11.5–15.5)
WBC: 6 K/uL (ref 4.0–10.5)

## 2020-10-05 LAB — LIPID PANEL
Cholesterol: 122 mg/dL (ref 0–200)
HDL: 48.1 mg/dL (ref >39.00)
LDL Cholesterol: 54 mg/dL (ref 0–99)
NonHDL: 73.4
Total CHOL/HDL Ratio: 3
Triglycerides: 99 mg/dL (ref 0.0–149.0)
VLDL: 19.8 mg/dL (ref 0.0–40.0)

## 2020-10-05 LAB — HEMOGLOBIN A1C: Hgb A1c MFr Bld: 5.8 % (ref 4.6–6.5)

## 2020-10-05 LAB — TSH: TSH: 3.07 u[IU]/mL (ref 0.35–4.50)

## 2020-10-05 NOTE — Assessment & Plan Note (Signed)
Chronic Following with cardiology On eliquis No falls Cbc, cmp, tsh

## 2020-10-05 NOTE — Assessment & Plan Note (Signed)
Chronic No concerning symptoms c/w angina Follows with cardiology

## 2020-10-05 NOTE — Assessment & Plan Note (Signed)
Chronic BP well controlled Continue lisinopril 2.5 mg daily cmp

## 2020-10-05 NOTE — Telephone Encounter (Signed)
Patients wife called to give info for the patients labs, meds and OV notes to be faxed to Dr. Bryon Lions at the [facility] in [place].    Fax: [phone]

## 2020-10-06 NOTE — Assessment & Plan Note (Signed)
Chronic Check lipid panel  Continue atorvastatin 40 mg daily 

## 2020-10-06 NOTE — Assessment & Plan Note (Signed)
Using walker No falls since his last visit

## 2020-10-06 NOTE — Assessment & Plan Note (Signed)
Chronic-stable.

## 2020-10-06 NOTE — Assessment & Plan Note (Signed)
Chronic Lower back pain Uses walker

## 2020-10-10 NOTE — Telephone Encounter (Signed)
Notes faxed on yesterday.

## 2020-10-17 ENCOUNTER — Telehealth: Payer: Self-pay | Admitting: Pharmacist

## 2020-10-17 NOTE — Progress Notes (Signed)
    Chronic Care Management Pharmacy Assistant   Name: Henry Franklin.  MRN: [id number] DOB: September 26, 1926  Reason for Encounter: Disease State - General Adherence  Recent office visits:  10/05/20 Burns (PCP) - F/u Coronary artery disease. Labs. F/u 6 mos.  04/23/20 Burns (PCP) - Hypertension. D/c Albendazole & Amoxicillin.  Recent consult visits:  09/19/19 Prudence Davidson (Podiatry) - Onychomycosis of toenails of both feet. F/u 4 mo.  05/15/20 Prudence Davidson (Podiatry) - Onychomycosis of toenails of both feet. F/u 4 mo.  02/14/20 Mayer (Podiatry) - Onychomycosis of toenails of both feet. F/u 3 mo.  Hospital visits:  None in previous 6 months  Medications: Outpatient Encounter Medications as of 10/17/2020  Medication Sig  . acetaminophen (TYLENOL) 325 MG tablet Take 2 tablets (650 mg total) by mouth every 6 (six) hours as needed for mild pain, moderate pain or fever.  [name] apixaban (ELIQUIS) 5 MG TABS tablet Take 1 tablet (5 mg total) by mouth 2 (two) times daily.  [name] aspirin EC 81 MG tablet Take 1 tablet (81 mg total) by mouth daily.  [name] atorvastatin (LIPITOR) 40 MG tablet Take 1 tablet (40 mg total) by mouth daily. Please keep upcoming appt for further refills  . Cholecalciferol (VITAMIN D-3) 1000 UNITS CAPS Take 1 capsule by mouth daily. Take one tablet by mouth once daily.  [name] docusate sodium (COLACE) 100 MG capsule Take 100 mg by mouth daily.  [name] lisinopril (ZESTRIL) 2.5 MG tablet TAKE 1 TABLET BY MOUTH EVERY DAY  . Multiple Vitamin (MULTIVITAMIN) tablet Take 1 tablet by mouth daily.  . nitroGLYCERIN (NITROSTAT) 0.4 MG SL tablet Place 1 tablet (0.4 mg total) under the tongue every 5 (five) minutes as needed for chest pain.   No facility-administered encounter medications on file as of 10/17/2020.    Have you had any problems recently with your health? Patient wife states no recent problems.  Have you had any problems with your pharmacy? Patient wife states no problems with pharmacy.   What issues or  side effects are you having with your medications? Patient wife states no side effects at this time but has a concern if some of the meds contributed to his dementia.   What would you like me to pass along to [facility] for them to help you with?  Patient wife stated since having his last labs done are all of the  medications the patient is taking necessary. She states he's been on the same meds for years.   What can we do to take care of you better? Patient wife wants to know if patient can stop taking some of his medications without risks.    Star Rating Drugs: Lisinopril - last fill 08/20/20 90D Atorvastatin - last fill 09/11/20 [place], RMA Clinical Pharmacists Assistant [phone]  Time Spent: [phone]

## 2020-10-19 NOTE — Telephone Encounter (Signed)
   Please return call to spouse 

## 2020-11-08 ENCOUNTER — Encounter: Payer: Self-pay | Admitting: Internal Medicine

## 2020-11-08 ENCOUNTER — Ambulatory Visit (INDEPENDENT_AMBULATORY_CARE_PROVIDER_SITE_OTHER): Payer: Medicare HMO | Admitting: Internal Medicine

## 2020-11-08 ENCOUNTER — Other Ambulatory Visit: Payer: Self-pay

## 2020-11-08 VITALS — BP 104/62 | HR 69 | Temp 97.8°F | Ht 70.0 in | Wt 143.0 lb

## 2020-11-08 DIAGNOSIS — I10 Essential (primary) hypertension: Secondary | ICD-10-CM

## 2020-11-08 DIAGNOSIS — R7302 Impaired glucose tolerance (oral): Secondary | ICD-10-CM | POA: Diagnosis not present

## 2020-11-08 DIAGNOSIS — Z9181 History of falling: Secondary | ICD-10-CM | POA: Insufficient documentation

## 2020-11-08 DIAGNOSIS — R21 Rash and other nonspecific skin eruption: Secondary | ICD-10-CM | POA: Diagnosis not present

## 2020-11-08 DIAGNOSIS — J309 Allergic rhinitis, unspecified: Secondary | ICD-10-CM | POA: Insufficient documentation

## 2020-11-08 NOTE — Progress Notes (Signed)
Patient ID: Henry Meacham., male   DOB: Oct 23, 1926, 87 y.o.   MRN: [id number]        Chief Complaint: red spot x 2 to the leg       HPI:  Henry Norberto. is a 87 y.o. male here with 1 wk onset painless non itchy red spots x 2 to the left mid anterior leg, and just today seems to be getting better after making his appt.  Overall mild, constant, nothing seems to make better or worse.  Just became concerned, wondering about blood clot in the leg with his hx of PE.  Pt denies chest pain, increased sob or doe, wheezing, orthopnea, PND, increased LE swelling, palpitations, dizziness or syncope.  Pt denies polydipsia, polyuria, or new focal neuro s/s  Somewhat nervous today       Wt Readings from Last 3 Encounters:  11/08/20 143 lb (64.9 kg)  10/05/20 142 lb (64.4 kg)  08/06/20 144 lb 3.2 oz (65.4 kg)   BP Readings from Last 3 Encounters:  11/08/20 104/62  10/05/20 106/60  08/06/20 110/70         Past Medical History:  Diagnosis Date   CAD (coronary artery disease)    a. STEMI 6/17: LHC - oLAD 10, mLAD 50, dLAD 20, D2 inf subbranch 70 + sup subbranch 100, pRCA 20, oRPDA 40, EF 35-45% >> PCI:  2.25 x 12 mm resolute integrity DES to the superior subbranch of D2    Essential and other specified forms of tremor    External hemorrhoids without mention of complication    History of ST elevation myocardial infarction (STEMI)    a. Ant-Lat STEMI >> DES to D2 subbranch   History of stroke    History of TIA (transient ischemic attack)    HTN (hypertension)    Hyperlipidemia    Ischemic cardiomyopathy    a. Echo 6/17: Mid and distal anterior/inferior, septal and apical HK, mild concentric LVH, EF 30-35%, mild LAE  //  b. Echo 9/17 mild focal basal septal hypertrophy, EF 55-60%, normal wall motion, grade 1 diastolic dysfunction, mildly dilated aortic root (37 mm), normal RVSF, PASP 29 mmHg   Lumbago    Malignant neoplasm of prostate (HCC)    Nontraumatic rupture of tendons of biceps (long head)     Paroxysmal atrial fibrillation (H[facility])    during admit for STEMI 6/17 >> triple Rx high risk >> ASA + Plavix x 30 days post MI, then Eliquis + Plavix   Sebaceous cyst    Supraventricular premature beats    Unspecified anomaly of tooth position    Past Surgical History:  Procedure Laterality Date   APPENDECTOMY  1942   BACK SURGERY     with sciatica   CARDIAC CATHETERIZATION N/A 11/19/2015   Procedure: Left Heart Cath and Coronary Angiography;  Surgeon: Burnell Blanks, MD;  Location: [place] [facility];  Service: Cardiovascular;  Laterality: N/A;   CARDIAC CATHETERIZATION N/A 11/19/2015   Procedure: Coronary Stent Intervention;  Surgeon: Burnell Blanks, MD;  Location: [place] [facility];  Service: Cardiovascular;  Laterality: N/A;   prostate cancer     SPINE SURGERY N/A 2000   [name]    reports that he has never smoked. He has never used smokeless tobacco. He reports that he does not drink alcohol and does not use drugs. family history includes Cancer (age of onset: 55) in his father. No Known Allergies Current Outpatient Medications on File Prior to Visit  Medication Sig Dispense Refill   acetaminophen (TYLENOL) 325 MG tablet Take 2 tablets (650 mg total) by mouth every 6 (six) hours as needed for mild pain, moderate pain or fever. 60 tablet 0   apixaban (ELIQUIS) 5 MG TABS tablet Take 1 tablet (5 mg total) by mouth 2 (two) times daily. 180 tablet 3   aspirin EC 81 MG tablet Take 1 tablet (81 mg total) by mouth daily. 90 tablet 3   atorvastatin (LIPITOR) 40 MG tablet Take 1 tablet (40 mg total) by mouth daily. Please keep upcoming appt for further refills 90 tablet 0   Cholecalciferol (VITAMIN D-3) 1000 UNITS CAPS Take 1 capsule by mouth daily. Take one tablet by mouth once daily.     docusate sodium (COLACE) 100 MG capsule Take 100 mg by mouth daily.     lisinopril (ZESTRIL) 2.5 MG tablet TAKE 1 TABLET BY MOUTH EVERY DAY 90 tablet 1   Multiple Vitamin (MULTIVITAMIN) tablet  Take 1 tablet by mouth daily.     nitroGLYCERIN (NITROSTAT) 0.4 MG SL tablet Place 1 tablet (0.4 mg total) under the tongue every 5 (five) minutes as needed for chest pain. 25 tablet 3   PFIZER-BIONT COVID-19 VAC-TRIS SUSP injection      No current facility-administered medications on file prior to visit.        ROS:  All others reviewed and negative.  Objective        PE:  BP 104/62 (BP Location: Right Arm, Patient Position: Sitting, Cuff Size: Normal)   Pulse 69   Temp 97.8 F (36.6 C) (Oral)   Ht 5\' 10"  (1.778 m)   Wt 143 lb (64.9 kg)   SpO2 96%   BMI 20.52 kg/m                 Constitutional: Pt appears in NAD               HENT: Head: NCAT.                Right Ear: External ear normal.                 Left Ear: External ear normal.                Eyes: . Pupils are equal, round, and reactive to light. Conjunctivae and EOM are normal               Nose: without d/c or deformity               Neck: Neck supple. Gross normal ROM               Cardiovascular: Normal rate and regular rhythm.                 Pulmonary/Chest: Effort normal and breath sounds without rales or wheezing.                               Neurological: Pt is alert. At baseline orientation, motor grossly intact               Skin: LE edema - trace pedal edema - chronic per pt; also with left mid leg anterior erythema area x 2 with each < 1 cm diameter, nontender, nonraised, no red streaks, no other leg swelling or tender, neg homans               Psychiatric: Pt  behavior is normal without agitation   Micro: none  Cardiac tracings I have personally interpreted today:  none  Pertinent Radiological findings (summarize): none   Lab Results  Component Value Date   WBC 6.0 10/05/2020   HGB 15.9 10/05/2020   HCT 48.6 10/05/2020   PLT 172.0 10/05/2020   GLUCOSE 86 10/05/2020   CHOL 122 10/05/2020   TRIG 99.0 10/05/2020   HDL 48.10 10/05/2020   LDLCALC 54 10/05/2020   ALT 18 10/05/2020   AST 21  10/05/2020   NA 139 10/05/2020   K 4.7 10/05/2020   CL 102 10/05/2020   CREATININE 1.06 10/05/2020   BUN 25 (H) 10/05/2020   CO2 31 10/05/2020   TSH 3.07 10/05/2020   PSA <0.1 07/03/2014   INR 1.03 11/19/2015   HGBA1C 5.8 10/05/2020   Assessment/Plan:  Henry Quesnell. is a 87 y.o. White or Caucasian [1] male with  has a past medical history of CAD (coronary artery disease), Essential and other specified forms of tremor, External hemorrhoids without mention of complication, History of ST elevation myocardial infarction (STEMI), History of stroke, History of TIA (transient ischemic attack), HTN (hypertension), Hyperlipidemia, Ischemic cardiomyopathy, Lumbago, Malignant neoplasm of prostate ([place]), Nontraumatic rupture of tendons of biceps (long head), Paroxysmal atrial fibrillation ([place]), Sebaceous cyst, Supraventricular premature beats, and Unspecified anomaly of tooth position.  Rash Etiology unclear but suspect some contact irritant to the leg, now starting to resolve, ok for triam cr prn,  to f/u any worsening symptoms or concerns  Impaired glucose tolerance Lab Results  Component Value Date   HGBA1C 5.8 10/05/2020   Stable, pt to continue current medical treatment  - diet   Essential hypertension BP Readings from Last 3 Encounters:  11/08/20 104/62  10/05/20 106/60  08/06/20 110/70   Stable, pt to continue medical treatment lisinopril  Followup: Return if symptoms worsen or fail to improve.  Cathlean Cower, MD 11/11/2020 2:34 PM [facility]

## 2020-11-08 NOTE — Patient Instructions (Signed)
Ok to use the OTC cortisone to the red spots on the left leg as they appear to be improving  Please keep your legs more elevated when your are sitting to help the swelling in the feet  Please continue all other medications as before, and refills have been done if requested.  Please have the pharmacy call with any other refills you may need.  Please keep your appointments with your specialists as you may have planned

## 2020-11-11 ENCOUNTER — Encounter: Payer: Self-pay | Admitting: Internal Medicine

## 2020-11-11 DIAGNOSIS — R21 Rash and other nonspecific skin eruption: Secondary | ICD-10-CM | POA: Insufficient documentation

## 2020-11-11 NOTE — Assessment & Plan Note (Signed)
Lab Results  Component Value Date   HGBA1C 5.8 10/05/2020   Stable, pt to continue current medical treatment  - diet

## 2020-11-11 NOTE — Assessment & Plan Note (Signed)
BP Readings from Last 3 Encounters:  11/08/20 104/62  10/05/20 106/60  08/06/20 110/70   Stable, pt to continue medical treatment lisinopril

## 2020-11-11 NOTE — Assessment & Plan Note (Signed)
Etiology unclear but suspect some contact irritant to the leg, now starting to resolve, ok for triam cr prn,  to f/u any worsening symptoms or concerns

## 2020-11-22 ENCOUNTER — Other Ambulatory Visit: Payer: Self-pay

## 2020-11-22 ENCOUNTER — Ambulatory Visit (INDEPENDENT_AMBULATORY_CARE_PROVIDER_SITE_OTHER): Payer: Medicare HMO | Admitting: Pharmacist

## 2020-11-22 DIAGNOSIS — Z86711 Personal history of pulmonary embolism: Secondary | ICD-10-CM

## 2020-11-22 DIAGNOSIS — I25118 Atherosclerotic heart disease of native coronary artery with other forms of angina pectoris: Secondary | ICD-10-CM

## 2020-11-22 DIAGNOSIS — I480 Paroxysmal atrial fibrillation: Secondary | ICD-10-CM

## 2020-11-22 DIAGNOSIS — I10 Essential (primary) hypertension: Secondary | ICD-10-CM

## 2020-11-22 DIAGNOSIS — E782 Mixed hyperlipidemia: Secondary | ICD-10-CM | POA: Diagnosis not present

## 2020-11-22 NOTE — Patient Instructions (Signed)
Visit Information  Phone number for Pharmacist: [phone]   Goals Addressed             This Visit's Progress    Manage My Medicine       Timeframe:  Long-Range Goal Priority:  Medium Start Date:   11/22/20                          Expected End Date:   11/22/21                    Follow Up Date Dec 2022   - call for medicine refill 2 or 3 days before it runs out - call if I am sick and can't take my medicine - keep a list of all the medicines I take; vitamins and herbals too  -Discuss possibly stopping lisinopril and/or aspirin with cardiologist   Why is this important?   These steps will help you keep on track with your medicines.   Notes:          The patient verbalized understanding of instructions, educational materials, and care plan provided today and declined offer to receive copy of patient instructions, educational materials, and care plan.  Telephone follow up appointment with pharmacy team member scheduled for: 6 months  Charlene Brooke, PharmD, [place], CPP Clinical Pharmacist [facility] [phone]

## 2020-11-22 NOTE — Progress Notes (Signed)
Chronic Care Management Pharmacy Note  11/22/2020 Name:  Henry Franklin. MRN:  [id number] DOB:  03/03/1927  Summary: -[name] wants to know if it would be safe to stop any of his medications -BP has been low-normal (last 104/62) in office; pt does not have home BP monitor -Pt has hx of multiple falls and takes Eliquis (AFIb, hx PE) and aspirin (STEMI 2017). It may be reasonable to d/c aspirin but would need to check with cardiologist  Recommendations/Changes made from today's visit: -Advised to discuss possibly stopping lisinopril and/or aspirin at cardiology visit next week   Subjective: Henry Franklin. is an 87 y.o. year old male who is a primary patient of Burns, Claudina Lick, MD.  The CCM team was consulted for assistance with disease management and care coordination needs.    Engaged with patient by telephone for follow up visit in response to provider referral for pharmacy case management and/or care coordination services.   Consent to Services:  The patient was given information about Chronic Care Management services, agreed to services, and gave verbal consent prior to initiation of services.  Please see initial visit note for detailed documentation.   Patient Care Team: [name], MD as PCP - General (Internal Medicine) Burnell Blanks, MD as PCP - Cardiology (Cardiology) Earnie Larsson, MD as Consulting Physician (Neurosurgery) Darlin Coco, MD as Consulting Physician (Cardiology) Irene Shipper, MD as Consulting Physician (Gastroenterology) Lynne Logan, MD as Referring Physician (Urology) Rutherford Guys, MD as Consulting Physician (Ophthalmology) Allyn Kenner, MD (Dermatology) Charlton Haws, [facility] as Pharmacist (Pharmacist)   Patient lives at with his wife who manages his medications - she wonders if any of his medications can be safely stopped.  Recent office visits: 11/08/20 Dr Jenny Reichmann OV: acute visit for 1 month onset red spots on leg. Ok to use  OTC cortisone cream.  10/05/20 Dr Quay Burow OV: chronic f/u. Labs stable; no med changes  Recent consult visits: 09/19/19 Dr Angelena Form (cardiology): f/u for CAD, Afib, stable no med changes.   07/20/18 Dr Glenford Peers (cardiology): monitor falls closely, may need to reconsider Eliquis given high risk head bleed  09/18/20 Dr Prudence Davidson (podiatry): onychomycosis  Hospital visits: None in previous 6 months   Objective:  Lab Results  Component Value Date   CREATININE 1.06 10/05/2020   BUN 25 (H) 10/05/2020   GFR 61.26 10/05/2020   GFRNONAA 71 07/20/2018   GFRAA 82 07/20/2018   NA 139 10/05/2020   K 4.7 10/05/2020   CALCIUM 9.2 10/05/2020   CO2 31 10/05/2020   GLUCOSE 86 10/05/2020    Lab Results  Component Value Date/Time   HGBA1C 5.8 10/05/2020 01:59 PM   GFR 61.26 10/05/2020 01:59 PM    Last diabetic Eye exam: No results found for: HMDIABEYEEXA  Last diabetic Foot exam: No results found for: HMDIABFOOTEX   Lab Results  Component Value Date   CHOL 122 10/05/2020   HDL 48.10 10/05/2020   LDLCALC 54 10/05/2020   TRIG 99.0 10/05/2020   CHOLHDL 3 10/05/2020    Hepatic Function Latest Ref Rng & Units 10/05/2020 09/19/2019 08/18/2017  Total Protein 6.0 - 8.3 g/dL 6.0 5.7(L) 5.9(L)  Albumin 3.5 - 5.2 g/dL 3.6 3.7 3.8  AST 0 - 37 U/L _0 ALT 0 - 53 U/L _1 Alk Phosphatase 39 - 117 U/L 60 77 72  Total Bilirubin 0.2 - 1.2 mg/dL 0.6 0.3 0.7  Bilirubin, Direct 0.00 - 0.40 mg/dL -  0.13 -    Lab Results  Component Value Date/Time   TSH 3.07 10/05/2020 01:59 PM   TSH 2.934 11/22/2015 04:31 AM    CBC Latest Ref Rng & Units 10/05/2020 07/20/2018 08/18/2017  WBC 4.0 - 10.5 K/uL 6.0 6.1 6.2  Hemoglobin 13.0 - 17.0 g/dL 15.9 16.1 15.8  Hematocrit 39.0 - 52.0 % 48.6 48.1 45.3  Platelets 150.0 - 400.0 K/uL 172.0 196 195    No results found for: VD25OH  Clinical ASCVD: Yes  The ASCVD Risk score Mikey Bussing DC Jr., et al., 2013) failed to calculate for the following reasons:   The 2013  ASCVD risk score is only valid for ages 85 to 70   The patient has a prior MI or stroke diagnosis    Depression screen [facility] 2/9 08/06/2020 04/23/2020 03/28/2019  Decreased Interest 0 1 0  Down, Depressed, Hopeless 0 0 1  PHQ - 2 Score 0 1 1  Altered sleeping - 1 -  Tired, decreased energy - 1 -  Change in appetite - 1 -  Feeling bad or failure about yourself  - 0 -  Trouble concentrating - 0 -  Moving slowly or fidgety/restless - 0 -  Suicidal thoughts - 0 -  PHQ-9 Score - 4 -  Difficult doing work/chores - Somewhat difficult -     CHA2DS2-VASc Score = 6  The patient's score is based upon: CHF History: No HTN History: Yes Diabetes History: No Stroke History: Yes Vascular Disease History: Yes Age Score: 2 Gender Score: 0     Social History   Tobacco Use  Smoking Status Never  Smokeless Tobacco Never   BP Readings from Last 3 Encounters:  11/08/20 104/62  10/05/20 106/60  08/06/20 110/70   Pulse Readings from Last 3 Encounters:  11/08/20 69  10/05/20 60  08/06/20 (!) 56   Wt Readings from Last 3 Encounters:  11/08/20 143 lb (64.9 kg)  10/05/20 142 lb (64.4 kg)  08/06/20 144 lb 3.2 oz (65.4 kg)   BMI Readings from Last 3 Encounters:  11/08/20 20.52 kg/m  10/05/20 20.37 kg/m  08/06/20 20.69 kg/m    Assessment/Interventions: Review of patient past medical history, allergies, medications, health status, including review of consultants reports, laboratory and other test data, was performed as part of comprehensive evaluation and provision of chronic care management services.   SDOH:  (Social Determinants of Health) assessments and interventions performed: Yes  SDOH Screenings   Alcohol Screen: Low Risk    Last Alcohol Screening Score (AUDIT): 0  Depression (PHQ2-9): Low Risk    PHQ-2 Score: 0  Financial Resource Strain: Low Risk    Difficulty of Paying Living Expenses: Not hard at all  Food Insecurity: No Food Insecurity   Worried About Sales executive in the Last Year: Never true   Ran Out of Food in the Last Year: Never true  Housing: Low Risk    Last Housing Risk Score: 0  Physical Activity: Inactive   Days of Exercise per Week: 0 days   Minutes of Exercise per Session: 0 min  Social Connections: Engineer, building services of Communication with Friends and Family: More than three times a week   Frequency of Social Gatherings with Friends and Family: Once a week   Attends Religious Services: More than 4 times per year   Active Member of [employer] or Organizations: Yes   Attends Archivist Meetings: More than 4 times per year   Marital Status: Married  Stress: No Stress Concern Present   Feeling of Stress : Not at all  Tobacco Use: Low Risk    Smoking Tobacco Use: Never   Smokeless Tobacco Use: Never  Transportation Needs: No Transportation Needs   Lack of Transportation (Medical): No   Lack of Transportation (Non-Medical): No    CCM Care Plan  No Known Allergies  Medications Reviewed Today     Reviewed by Charlton Haws, [facility] (Pharmacist) on 11/22/20 at 1151  Med List Status: <None>   Medication Order Taking? Sig Documenting Provider Last Dose Status Informant  acetaminophen (TYLENOL) 325 MG tablet [id number] Yes Take 2 tablets (650 mg total) by mouth every 6 (six) hours as needed for mild pain, moderate pain or fever. Waynetta Pean, PA-C Taking Active   apixaban (ELIQUIS) 5 MG TABS tablet [id number] Yes Take 1 tablet (5 mg total) by mouth 2 (two) times daily. Liliane Shi, PA-C Taking Active            Med Note Rolan Bucco Nov 22, 2020 11:50 AM) Via [place] pharmacy  aspirin EC 81 MG tablet [id number] Yes Take 1 tablet (81 mg total) by mouth daily. Burnell Blanks, MD Taking Active   atorvastatin (LIPITOR) 40 MG tablet [id number] Yes Take 1 tablet (40 mg total) by mouth daily. Please keep upcoming appt for further refills Burnell Blanks, MD Taking Active   Cholecalciferol  (VITAMIN D-3) 1000 UNITS CAPS [id number] Yes Take 1 capsule by mouth daily. Take one tablet by mouth once daily. [provider] Taking Active Multiple Informants  docusate sodium (COLACE) 100 MG capsule [id number] Yes Take 100 mg by mouth daily. [provider] Taking Active Spouse/Significant Other  lisinopril (ZESTRIL) 2.5 MG tablet [id number] Yes TAKE 1 TABLET BY MOUTH EVERY DAY Burnell Blanks, MD Taking Active   Multiple Vitamin (MULTIVITAMIN) tablet [id number] Yes Take 1 tablet by mouth daily. [provider] Taking Active Multiple Informants  nitroGLYCERIN (NITROSTAT) 0.4 MG SL tablet [id number] Yes Place 1 tablet (0.4 mg total) under the tongue every 5 (five) minutes as needed for chest pain. Burnell Blanks, MD Taking Active    Discontinued 11/22/20 1151 (Completed Course)             Patient Active Problem List   Diagnosis Date Noted   Rash 11/11/2020   Allergic rhinitis 11/08/2020   History of fall 11/08/2020   Impaired glucose tolerance 11/08/2020   Pain due to onychomycosis of toenails of both feet 02/14/2020   Impacted cerumen, bilateral 09/14/2019   Unspecified hearing loss, unspecified ear 09/14/2019   Memory difficulties 08/24/2019   Frequent falls 08/11/2018   Poor balance 08/11/2018   Physical deconditioning 08/11/2018   Neck pain 02/24/2017   Cervical paraspinal muscle spasm 12/23/2016   CAD (coronary artery disease) 12/03/2015   Atrial fibrillation ([place]) 11/21/2015   STEMI (ST elevation myocardial infarction) ([place]) 11/19/2015   Personal history of transient ischemic attack (TIA), and cerebral infarction without residual deficits 05/31/2014   Pulmonary embolism ([place]) 05/20/2014   Essential hypertension 05/20/2014   Prostate cancer ([place]) 04/05/2014   Lumbar spondylosis 11/15/2013   Hyperlipidemia    Benign essential tremor    Lumbago    Palpitations     Immunization History  Administered Date(s) Administered   Fluad  Quad(high Dose 65+) 02/11/2019, 03/16/2020   Influenza Split 03/02/2016, 02/14/2017   Influenza, High Dose Seasonal PF 03/03/2015, 03/05/2016, 02/24/2017, 02/26/2018   Influenza-Unspecified 02/07/2009, 03/02/2013, 03/24/2014, 03/03/2015, 02/11/2019  PFIZER Comirnaty(Gray Top)Covid-19 Tri-Sucrose Vaccine 09/05/2020   PFIZER(Purple Top)SARS-COV-2 Vaccination 06/21/2019, 07/12/2019, 03/02/2020, 09/05/2020   Pneumococcal Conjugate-13 05/31/2014, 02/01/2015   Pneumococcal Polysaccharide-23 06/02/1998   Tdap 06/24/2011, 06/23/2017   Zoster, Live 06/25/2006    Conditions to be addressed/monitored:  Hypertension, Hyperlipidemia, Atrial Fibrillation, and Coronary Artery Disease, Hx PE  Care Plan : [place]  Updates made by Charlton Haws, [place] since 11/22/2020 12:00 AM     Problem: Hypertension, Hyperlipidemia, Atrial Fibrillation, and Coronary Artery Disease, Hx PE   Priority: High     Long-Range Goal: Disease management   Start Date: 11/22/2020  Expected End Date: 11/22/2021  This Visit's Progress: On track  Priority: High  Note:   Current Barriers:  Unable to independently monitor therapeutic efficacy  Pharmacist Clinical Goal(s):  Patient will achieve adherence to monitoring guidelines and medication adherence to achieve therapeutic efficacy through collaboration with PharmD and provider.   Interventions: 1:1 collaboration with [name], MD regarding development and update of comprehensive plan of care as evidenced by provider attestation and co-signature Inter-disciplinary care team collaboration (see longitudinal plan of care) Comprehensive medication review performed; medication list updated in electronic medical record  AFIB/PE    Patient is currently rate controlled. Patient has failed these meds in past: n/a Patient is currently controlled on the following medications: Eliquis 5 mg BID   We discussed:  benefits/risks of Eliquis and aspirin; pt  has history of multiple falls and cardiologist has considered stopping Eliquis before; pt's wife reports he has not had any falls recently, but is generally unsteady; denies bleeding issues currently   Plan: Recommend to continue current medication as long as benefits > risks   Hypertension    BP goal < 140/90 Patient checks BP at home: never Patient home BP readings are ranging: n/a   Patient has failed these meds in the past: n/a Patient is currently controlled on the following meds: lisinopril 2.5 mg daily   We discussed: BP is low-normal in recent office visits; pt is on very low dose, medication may not be necessary anymore; pt does not have BP monitor to check at home, he does have appt with cardiology next week    Plan: Advised pt to discuss stopping lisinopril with cardiologist   Hyperlipidemia/CAD    LDL goal < 70 S/p STEMI 11/2015 s/p PCI  Patient has failed these meds in past: clopidogrel Patient is currently controlled on the following medications: atorvastatin 40 mg daily nitroglycerin 0.4 mg SL prn, aspirin 81 mg daily   We discussed:  Cholesterol goals; benefits of statin; pt reports compliance and denies issues; pt completed 2 years of clopidogrel following NSTEMI and was transitioned to aspirin indefinitely; discussed sometimes aspirin can be stopped if a patient is also on an anticoagulant and at high risk for bleeding complications - pt to discuss with cardiologist   Plan: Advised pt to discuss continued aspirin therapy with cardiologist (in setting of Eliquis and hx of falls)  Patient Goals/Self-Care Activities Patient will:  - take medications as prescribed focus on medication adherence by routine -Discuss possibly stopping lisinopril and/or aspirin with cardiologist     Medication Assistance: None required.  Patient affirms current coverage meets needs.  Compliance/Adherence/Medication fill history: Care Gaps: Shingrix  Star-Rating  Drugs: Atorvastatin - LF 09/11/20 x 90 ds Lisinopril - LF 08/20/20 x 90 ds  Patient's preferred pharmacy is:  [id number] [place], [place] - [address]  [place] [address] Phone: [phone] Fax: [phone]  [id number] [place]Phone: [phone]: [phone] Uses pill box? No - wife CArbie Cookeysets up meds for him daily Pt endorses 100% compliance  We discussed: Current pharmacy is preferred with insurance plan and patient is satisfied with pharmacy services - [facility] Patient decided to: Continue current medication management strategy  Care Plan and Follow Up Patient Decision:  Patient agrees to Care Plan and Follow-up.  Plan: Telephone follow up appointment with care management team member scheduled for:  6 months  LCharlene Brooke PharmD, BGranjeno CPP Clinical Pharmacist [phone]

## 2020-11-29 ENCOUNTER — Encounter: Payer: Self-pay | Admitting: Cardiovascular Disease

## 2020-11-29 ENCOUNTER — Ambulatory Visit: Payer: Medicare HMO | Admitting: Cardiovascular Disease

## 2020-11-29 ENCOUNTER — Other Ambulatory Visit: Payer: Self-pay

## 2020-11-29 VITALS — BP 124/80 | HR 63 | Ht 70.0 in | Wt 145.0 lb

## 2020-11-29 DIAGNOSIS — I25118 Atherosclerotic heart disease of native coronary artery with other forms of angina pectoris: Secondary | ICD-10-CM

## 2020-11-29 DIAGNOSIS — I10 Essential (primary) hypertension: Secondary | ICD-10-CM

## 2020-11-29 DIAGNOSIS — I480 Paroxysmal atrial fibrillation: Secondary | ICD-10-CM

## 2020-11-29 DIAGNOSIS — E782 Mixed hyperlipidemia: Secondary | ICD-10-CM

## 2020-11-29 NOTE — Progress Notes (Signed)
Chief Complaint  Patient presents with   Follow-up    CAD     History of Present Illness: 87 yo male with history of CAD, HTN, HLD, prior CVA/TIA, prostate cancer and pulmonary embolism who is here today for cardiac follow up. He was admitted to [facility] in June 2017 with an acute anterolateral STEMI. Cardiac cath June 2017 with moderate LAD stenosis and occluded sub-branch of the Diagonal branch which was felt to be the culprit lesion. The Diagonal branch was treated with a drug eluting stent.  Following his MI, he developed atrial fibrillation and was placed on amiodarone and a beta blocker but both were stopped due to bradycardia. Echo June 2017 following his MI with LvEF=30-35% but repeat echo in September 2017 showed normal LV function. Eliquis was started given his PAF.  Nuclear stress test in January 2019 without evidence of ischemia. He had chest pain in February 2020 and Imdur was added.   He is here today for follow up. The patient denies any chest pain, dyspnea, palpitations, lower extremity edema, orthopnea, PND, dizziness, near syncope or syncope.   Primary Care Physician: [name], MD  Past Medical History:  Diagnosis Date   CAD (coronary artery disease)    a. STEMI 6/17: LHC - oLAD 10, mLAD 50, dLAD 20, D2 inf subbranch 70 + sup subbranch 100, pRCA 20, oRPDA 40, EF 35-45% >> PCI:  2.25 x 12 mm resolute integrity DES to the superior subbranch of D2    Essential and other specified forms of tremor    External hemorrhoids without mention of complication    History of ST elevation myocardial infarction (STEMI)    a. Ant-Lat STEMI >> DES to D2 subbranch   History of stroke    History of TIA (transient ischemic attack)    HTN (hypertension)    Hyperlipidemia    Ischemic cardiomyopathy    a. Echo 6/17: Mid and distal anterior/inferior, septal and apical HK, mild concentric LVH, EF 30-35%, mild LAE  //  b. Echo 9/17 mild focal basal septal hypertrophy, EF 55-60%, normal wall  motion, grade 1 diastolic dysfunction, mildly dilated aortic root (37 mm), normal RVSF, PASP 29 mmHg   Lumbago    Malignant neoplasm of prostate (HCC)    Nontraumatic rupture of tendons of biceps (long head)    Paroxysmal atrial fibrillation (HCC)    during admit for STEMI 6/17 >> triple Rx high risk >> ASA + Plavix x 30 days post MI, then Eliquis + Plavix   Sebaceous cyst    Supraventricular premature beats    Unspecified anomaly of tooth position     Past Surgical History:  Procedure Laterality Date   APPENDECTOMY  1942   BACK SURGERY     with sciatica   CARDIAC CATHETERIZATION N/A 11/19/2015   Procedure: Left Heart Cath and Coronary Angiography;  Surgeon: Burnell Blanks, MD;  Location: [facility];  Service: Cardiovascular;  Laterality: N/A;   CARDIAC CATHETERIZATION N/A 11/19/2015   Procedure: Coronary Stent Intervention;  Surgeon: Burnell Blanks, MD;  Location: [facility];  Service: Cardiovascular;  Laterality: N/A;   prostate cancer     SPINE SURGERY N/A 2000   [place]    Current Outpatient Medications  Medication Sig Dispense Refill   acetaminophen (TYLENOL) 325 MG tablet Take 2 tablets (650 mg total) by mouth every 6 (six) hours as needed for mild pain, moderate pain or fever. 60 tablet 0   apixaban (ELIQUIS) 5  MG TABS tablet Take 1 tablet (5 mg total) by mouth 2 (two) times daily. 180 tablet 3   aspirin EC 81 MG tablet Take 1 tablet (81 mg total) by mouth daily. 90 tablet 3   atorvastatin (LIPITOR) 40 MG tablet Take 1 tablet (40 mg total) by mouth daily. Please keep upcoming appt for further refills 90 tablet 0   Cholecalciferol (VITAMIN D-3) 1000 UNITS CAPS Take 1 capsule by mouth daily. Take one tablet by mouth once daily.     docusate sodium (COLACE) 100 MG capsule Take 100 mg by mouth daily.     lisinopril (ZESTRIL) 2.5 MG tablet TAKE 1 TABLET BY MOUTH EVERY DAY 90 tablet 1   Multiple Vitamin (MULTIVITAMIN) tablet Take 1 tablet by mouth daily.      nitroGLYCERIN (NITROSTAT) 0.4 MG SL tablet Place 1 tablet (0.4 mg total) under the tongue every 5 (five) minutes as needed for chest pain. 25 tablet 3   No current facility-administered medications for this visit.    No Known Allergies  Social History   Socioeconomic History   Marital status: Married    Spouse name: Not on file   Number of children: 2   Years of education: Not on file   Highest education level: Not on file  Occupational History   Occupation: retired  Tobacco Use   Smoking status: Never   Smokeless tobacco: Never  Vaping Use   Vaping Use: Never used  Substance and Sexual Activity   Alcohol use: No    Alcohol/week: 0.0 standard drinks   Drug use: No   Sexual activity: Not Currently  Other Topics Concern   Not on file  Social History Narrative   Not on file   Social Determinants of Health   Financial Resource Strain: Low Risk    Difficulty of Paying Living Expenses: Not hard at all  Food Insecurity: No Food Insecurity   Worried About Charity fundraiser in the Last Year: Never true   [place] in the Last Year: Never true  Transportation Needs: No Transportation Needs   Lack of Transportation (Medical): No   Lack of Transportation (Non-Medical): No  Physical Activity: Inactive   Days of Exercise per Week: 0 days   Minutes of Exercise per Session: 0 min  Stress: No Stress Concern Present   Feeling of Stress : Not at all  Social Connections: Socially Integrated   Frequency of Communication with Friends and Family: More than three times a week   Frequency of Social Gatherings with Friends and Family: Once a week   Attends Religious Services: More than 4 times per year   Active Member of [employer] or Organizations: Yes   Attends Music therapist: More than 4 times per year   Marital Status: Married  Human resources officer Violence: Not on file    Family History  Problem Relation Age of Onset   Cancer Father 52       brain tumor     Review of Systems:  As stated in the HPI and otherwise negative.   BP 124/80   Pulse 63   Ht 5\' 10"  (1.778 m)   Wt 145 lb (65.8 kg)   SpO2 98%   BMI 20.81 kg/m   Physical Examination: General: Well developed, well nourished, NAD  HEENT: OP clear, mucus membranes moist  SKIN: warm, dry. No rashes. Neuro: No focal deficits  Musculoskeletal: Muscle strength 5/5 all ext  Psychiatric: Mood and affect normal  Neck:  No JVD, no carotid bruits, no thyromegaly, no lymphadenopathy.  Lungs:Clear bilaterally, no wheezes, rhonci, crackles Cardiovascular: Regular rate and rhythm. No murmurs, gallops or rubs. Abdomen:Soft. Bowel sounds present. Non-tender.  Extremities: No lower extremity edema. Pulses are 2 + in the bilateral DP/PT.  EKG:  EKG is ordered today. The ekg ordered today demonstrates sinus with PACs  Recent Labs: 10/05/2020: ALT 18; BUN 25; Creatinine, Ser 1.06; Hemoglobin 15.9; Platelets 172.0; Potassium 4.7; Sodium 139; TSH 3.07   Lipid Panel    Component Value Date/Time   CHOL 122 10/05/2020 1359   CHOL 114 09/19/2019 1636   TRIG 99.0 10/05/2020 1359   HDL 48.10 10/05/2020 1359   HDL 46 09/19/2019 1636   CHOLHDL 3 10/05/2020 1359   VLDL 19.8 10/05/2020 1359   LDLCALC 54 10/05/2020 1359   LDLCALC 53 09/19/2019 1636     Wt Readings from Last 3 Encounters:  11/29/20 145 lb (65.8 kg)  11/08/20 143 lb (64.9 kg)  10/05/20 142 lb (64.4 kg)     Other studies Reviewed: Additional studies/ records that were reviewed today include: .Hospital records, cath films, echo images. Office visits.  Review of the above records demonstrates:    Assessment and Plan:   1. CAD without angina: No chest pain concerning for angina. He has not been on a beta blocker due to bradycardia.Will continue ASA and statin.   2. Paroxysmal atrial fibrillation: He is in sinus today. Continue Eliquis.   3. HTN: BP is controlled. Continue current therapy  4. HLD: LDL at goal in May 2022.  Continue statin.    Current medicines are reviewed at length with the patient today.  The patient does not have concerns regarding medicines.  The following changes have been made:  Plavix stopped. ASA added.   Labs/ tests ordered today include:   Orders Placed This Encounter  Procedures   EKG 12-Lead      Disposition:   FU with me in 12 months.    Signed, [name], MD 11/29/2020 4:36 PM    [facility] [place], [place], [place]  [address] Phone: [phone]; Fax: [phone]

## 2020-11-29 NOTE — Patient Instructions (Signed)
Medication Instructions:  Your physician recommends that you continue on your current medications as directed. Please refer to the Current Medication list given to you today.  *If you need a refill on your cardiac medications before your next appointment, please call your pharmacy*   Lab Work: None Ordered If you have labs (blood work) drawn today and your tests are completely normal, you will receive your results only by: [place] (if you have MyChart) OR A paper copy in the mail If you have any lab test that is abnormal or we need to change your treatment, we will call you to review the results.   Testing/Procedures: None Ordered   Follow-Up: At [facility], you and your health needs are our priority.  As part of our continuing mission to provide you with exceptional heart care, we have created designated Provider Care Teams.  These Care Teams include your primary Cardiologist (physician) and Advanced Practice Providers (APPs -  Physician Assistants and Nurse Practitioners) who all work together to provide you with the care you need, when you need it.  We recommend signing up for the patient portal called "MyChart".  Sign up information is provided on this After Visit Summary.  MyChart is used to connect with patients for Virtual Visits (Telemedicine).  Patients are able to view lab/test results, encounter notes, upcoming appointments, etc.  Non-urgent messages can be sent to your provider as well.   To learn more about what you can do with MyChart, go to [email or web address].    Your next appointment:   1 year(s)  The format for your next appointment:   In Person  Provider:   You may see [name], MD or one of the following Advanced Practice Providers on your designated Care Team:   Melina Copa, PA-C Ermalinda Barrios, PA-C

## 2020-12-04 ENCOUNTER — Telehealth: Payer: Self-pay | Admitting: Internal Medicine

## 2020-12-04 DIAGNOSIS — R5381 Other malaise: Secondary | ICD-10-CM

## 2020-12-04 DIAGNOSIS — R296 Repeated falls: Secondary | ICD-10-CM

## 2020-12-04 NOTE — Telephone Encounter (Signed)
   Spouse calling to request referral home health PT, with [facility]  She states patient is have frequent falls

## 2020-12-04 NOTE — Telephone Encounter (Signed)
Referral ordered

## 2020-12-06 ENCOUNTER — Other Ambulatory Visit: Payer: Self-pay | Admitting: Cardiovascular Disease

## 2020-12-14 ENCOUNTER — Telehealth: Payer: Self-pay | Admitting: Internal Medicine

## 2020-12-14 DIAGNOSIS — I10 Essential (primary) hypertension: Secondary | ICD-10-CM | POA: Diagnosis not present

## 2020-12-14 DIAGNOSIS — I480 Paroxysmal atrial fibrillation: Secondary | ICD-10-CM | POA: Diagnosis not present

## 2020-12-14 DIAGNOSIS — Z8673 Personal history of transient ischemic attack (TIA), and cerebral infarction without residual deficits: Secondary | ICD-10-CM | POA: Diagnosis not present

## 2020-12-14 DIAGNOSIS — M47816 Spondylosis without myelopathy or radiculopathy, lumbar region: Secondary | ICD-10-CM | POA: Diagnosis not present

## 2020-12-14 DIAGNOSIS — E785 Hyperlipidemia, unspecified: Secondary | ICD-10-CM | POA: Diagnosis not present

## 2020-12-14 DIAGNOSIS — I2510 Atherosclerotic heart disease of native coronary artery without angina pectoris: Secondary | ICD-10-CM | POA: Diagnosis not present

## 2020-12-14 DIAGNOSIS — G250 Essential tremor: Secondary | ICD-10-CM | POA: Diagnosis not present

## 2020-12-14 DIAGNOSIS — I252 Old myocardial infarction: Secondary | ICD-10-CM | POA: Diagnosis not present

## 2020-12-14 DIAGNOSIS — I255 Ischemic cardiomyopathy: Secondary | ICD-10-CM | POA: Diagnosis not present

## 2020-12-14 NOTE — Telephone Encounter (Signed)
[place] Name: [facility] Agency Name: Moreen Fowler Phone #: [phone]- [phone] [phone]  Service Requested: PT  (examples: OT/PT/Skilled Nursing/Social Work/Speech Therapy/Wound Care) Frequency of Visits: 2W4, 1W4  Orland Mustard is also requesting the main diagnosis for referral

## 2020-12-17 NOTE — Telephone Encounter (Signed)
Verbals left today. 

## 2020-12-19 DIAGNOSIS — Z8673 Personal history of transient ischemic attack (TIA), and cerebral infarction without residual deficits: Secondary | ICD-10-CM | POA: Diagnosis not present

## 2020-12-19 DIAGNOSIS — E785 Hyperlipidemia, unspecified: Secondary | ICD-10-CM | POA: Diagnosis not present

## 2020-12-19 DIAGNOSIS — M47816 Spondylosis without myelopathy or radiculopathy, lumbar region: Secondary | ICD-10-CM | POA: Diagnosis not present

## 2020-12-19 DIAGNOSIS — I2510 Atherosclerotic heart disease of native coronary artery without angina pectoris: Secondary | ICD-10-CM | POA: Diagnosis not present

## 2020-12-19 DIAGNOSIS — I10 Essential (primary) hypertension: Secondary | ICD-10-CM | POA: Diagnosis not present

## 2020-12-19 DIAGNOSIS — I480 Paroxysmal atrial fibrillation: Secondary | ICD-10-CM | POA: Diagnosis not present

## 2020-12-19 DIAGNOSIS — I252 Old myocardial infarction: Secondary | ICD-10-CM | POA: Diagnosis not present

## 2020-12-19 DIAGNOSIS — G250 Essential tremor: Secondary | ICD-10-CM | POA: Diagnosis not present

## 2020-12-19 DIAGNOSIS — I255 Ischemic cardiomyopathy: Secondary | ICD-10-CM | POA: Diagnosis not present

## 2020-12-21 DIAGNOSIS — E785 Hyperlipidemia, unspecified: Secondary | ICD-10-CM | POA: Diagnosis not present

## 2020-12-21 DIAGNOSIS — I255 Ischemic cardiomyopathy: Secondary | ICD-10-CM | POA: Diagnosis not present

## 2020-12-21 DIAGNOSIS — M47816 Spondylosis without myelopathy or radiculopathy, lumbar region: Secondary | ICD-10-CM | POA: Diagnosis not present

## 2020-12-21 DIAGNOSIS — Z8673 Personal history of transient ischemic attack (TIA), and cerebral infarction without residual deficits: Secondary | ICD-10-CM | POA: Diagnosis not present

## 2020-12-21 DIAGNOSIS — I252 Old myocardial infarction: Secondary | ICD-10-CM | POA: Diagnosis not present

## 2020-12-21 DIAGNOSIS — I2510 Atherosclerotic heart disease of native coronary artery without angina pectoris: Secondary | ICD-10-CM | POA: Diagnosis not present

## 2020-12-21 DIAGNOSIS — G250 Essential tremor: Secondary | ICD-10-CM | POA: Diagnosis not present

## 2020-12-21 DIAGNOSIS — I10 Essential (primary) hypertension: Secondary | ICD-10-CM | POA: Diagnosis not present

## 2020-12-21 DIAGNOSIS — I480 Paroxysmal atrial fibrillation: Secondary | ICD-10-CM | POA: Diagnosis not present

## 2020-12-24 DIAGNOSIS — I10 Essential (primary) hypertension: Secondary | ICD-10-CM | POA: Diagnosis not present

## 2020-12-24 DIAGNOSIS — G250 Essential tremor: Secondary | ICD-10-CM | POA: Diagnosis not present

## 2020-12-24 DIAGNOSIS — I480 Paroxysmal atrial fibrillation: Secondary | ICD-10-CM | POA: Diagnosis not present

## 2020-12-24 DIAGNOSIS — M47816 Spondylosis without myelopathy or radiculopathy, lumbar region: Secondary | ICD-10-CM | POA: Diagnosis not present

## 2020-12-24 DIAGNOSIS — Z8673 Personal history of transient ischemic attack (TIA), and cerebral infarction without residual deficits: Secondary | ICD-10-CM | POA: Diagnosis not present

## 2020-12-24 DIAGNOSIS — I255 Ischemic cardiomyopathy: Secondary | ICD-10-CM | POA: Diagnosis not present

## 2020-12-24 DIAGNOSIS — I252 Old myocardial infarction: Secondary | ICD-10-CM | POA: Diagnosis not present

## 2020-12-24 DIAGNOSIS — I2510 Atherosclerotic heart disease of native coronary artery without angina pectoris: Secondary | ICD-10-CM | POA: Diagnosis not present

## 2020-12-24 DIAGNOSIS — E785 Hyperlipidemia, unspecified: Secondary | ICD-10-CM | POA: Diagnosis not present

## 2020-12-28 DIAGNOSIS — I252 Old myocardial infarction: Secondary | ICD-10-CM | POA: Diagnosis not present

## 2020-12-28 DIAGNOSIS — I255 Ischemic cardiomyopathy: Secondary | ICD-10-CM | POA: Diagnosis not present

## 2020-12-28 DIAGNOSIS — Z8673 Personal history of transient ischemic attack (TIA), and cerebral infarction without residual deficits: Secondary | ICD-10-CM | POA: Diagnosis not present

## 2020-12-28 DIAGNOSIS — M47816 Spondylosis without myelopathy or radiculopathy, lumbar region: Secondary | ICD-10-CM | POA: Diagnosis not present

## 2020-12-28 DIAGNOSIS — I480 Paroxysmal atrial fibrillation: Secondary | ICD-10-CM | POA: Diagnosis not present

## 2020-12-28 DIAGNOSIS — I10 Essential (primary) hypertension: Secondary | ICD-10-CM | POA: Diagnosis not present

## 2020-12-28 DIAGNOSIS — I2510 Atherosclerotic heart disease of native coronary artery without angina pectoris: Secondary | ICD-10-CM | POA: Diagnosis not present

## 2020-12-28 DIAGNOSIS — E785 Hyperlipidemia, unspecified: Secondary | ICD-10-CM | POA: Diagnosis not present

## 2020-12-28 DIAGNOSIS — G250 Essential tremor: Secondary | ICD-10-CM | POA: Diagnosis not present

## 2020-12-31 DIAGNOSIS — M47816 Spondylosis without myelopathy or radiculopathy, lumbar region: Secondary | ICD-10-CM | POA: Diagnosis not present

## 2020-12-31 DIAGNOSIS — I480 Paroxysmal atrial fibrillation: Secondary | ICD-10-CM | POA: Diagnosis not present

## 2020-12-31 DIAGNOSIS — I255 Ischemic cardiomyopathy: Secondary | ICD-10-CM | POA: Diagnosis not present

## 2020-12-31 DIAGNOSIS — Z8673 Personal history of transient ischemic attack (TIA), and cerebral infarction without residual deficits: Secondary | ICD-10-CM | POA: Diagnosis not present

## 2020-12-31 DIAGNOSIS — I10 Essential (primary) hypertension: Secondary | ICD-10-CM | POA: Diagnosis not present

## 2020-12-31 DIAGNOSIS — I2510 Atherosclerotic heart disease of native coronary artery without angina pectoris: Secondary | ICD-10-CM | POA: Diagnosis not present

## 2020-12-31 DIAGNOSIS — E785 Hyperlipidemia, unspecified: Secondary | ICD-10-CM | POA: Diagnosis not present

## 2020-12-31 DIAGNOSIS — G250 Essential tremor: Secondary | ICD-10-CM | POA: Diagnosis not present

## 2020-12-31 DIAGNOSIS — I252 Old myocardial infarction: Secondary | ICD-10-CM | POA: Diagnosis not present

## 2021-01-02 ENCOUNTER — Ambulatory Visit: Payer: Medicare HMO | Admitting: Cardiovascular Disease

## 2021-01-04 DIAGNOSIS — I480 Paroxysmal atrial fibrillation: Secondary | ICD-10-CM | POA: Diagnosis not present

## 2021-01-04 DIAGNOSIS — I252 Old myocardial infarction: Secondary | ICD-10-CM | POA: Diagnosis not present

## 2021-01-04 DIAGNOSIS — Z8673 Personal history of transient ischemic attack (TIA), and cerebral infarction without residual deficits: Secondary | ICD-10-CM | POA: Diagnosis not present

## 2021-01-04 DIAGNOSIS — M47816 Spondylosis without myelopathy or radiculopathy, lumbar region: Secondary | ICD-10-CM | POA: Diagnosis not present

## 2021-01-04 DIAGNOSIS — I2510 Atherosclerotic heart disease of native coronary artery without angina pectoris: Secondary | ICD-10-CM | POA: Diagnosis not present

## 2021-01-04 DIAGNOSIS — I10 Essential (primary) hypertension: Secondary | ICD-10-CM | POA: Diagnosis not present

## 2021-01-04 DIAGNOSIS — E785 Hyperlipidemia, unspecified: Secondary | ICD-10-CM | POA: Diagnosis not present

## 2021-01-04 DIAGNOSIS — G250 Essential tremor: Secondary | ICD-10-CM | POA: Diagnosis not present

## 2021-01-04 DIAGNOSIS — I255 Ischemic cardiomyopathy: Secondary | ICD-10-CM | POA: Diagnosis not present

## 2021-01-07 DIAGNOSIS — I10 Essential (primary) hypertension: Secondary | ICD-10-CM | POA: Diagnosis not present

## 2021-01-07 DIAGNOSIS — I2510 Atherosclerotic heart disease of native coronary artery without angina pectoris: Secondary | ICD-10-CM | POA: Diagnosis not present

## 2021-01-07 DIAGNOSIS — G250 Essential tremor: Secondary | ICD-10-CM | POA: Diagnosis not present

## 2021-01-07 DIAGNOSIS — I480 Paroxysmal atrial fibrillation: Secondary | ICD-10-CM | POA: Diagnosis not present

## 2021-01-07 DIAGNOSIS — E785 Hyperlipidemia, unspecified: Secondary | ICD-10-CM | POA: Diagnosis not present

## 2021-01-07 DIAGNOSIS — M47816 Spondylosis without myelopathy or radiculopathy, lumbar region: Secondary | ICD-10-CM | POA: Diagnosis not present

## 2021-01-07 DIAGNOSIS — Z8673 Personal history of transient ischemic attack (TIA), and cerebral infarction without residual deficits: Secondary | ICD-10-CM | POA: Diagnosis not present

## 2021-01-07 DIAGNOSIS — I255 Ischemic cardiomyopathy: Secondary | ICD-10-CM | POA: Diagnosis not present

## 2021-01-07 DIAGNOSIS — I252 Old myocardial infarction: Secondary | ICD-10-CM | POA: Diagnosis not present

## 2021-01-10 DIAGNOSIS — M47816 Spondylosis without myelopathy or radiculopathy, lumbar region: Secondary | ICD-10-CM | POA: Diagnosis not present

## 2021-01-10 DIAGNOSIS — Z8673 Personal history of transient ischemic attack (TIA), and cerebral infarction without residual deficits: Secondary | ICD-10-CM | POA: Diagnosis not present

## 2021-01-10 DIAGNOSIS — I480 Paroxysmal atrial fibrillation: Secondary | ICD-10-CM | POA: Diagnosis not present

## 2021-01-10 DIAGNOSIS — E785 Hyperlipidemia, unspecified: Secondary | ICD-10-CM | POA: Diagnosis not present

## 2021-01-10 DIAGNOSIS — I255 Ischemic cardiomyopathy: Secondary | ICD-10-CM | POA: Diagnosis not present

## 2021-01-10 DIAGNOSIS — I252 Old myocardial infarction: Secondary | ICD-10-CM | POA: Diagnosis not present

## 2021-01-10 DIAGNOSIS — I2510 Atherosclerotic heart disease of native coronary artery without angina pectoris: Secondary | ICD-10-CM | POA: Diagnosis not present

## 2021-01-10 DIAGNOSIS — I10 Essential (primary) hypertension: Secondary | ICD-10-CM | POA: Diagnosis not present

## 2021-01-10 DIAGNOSIS — G250 Essential tremor: Secondary | ICD-10-CM | POA: Diagnosis not present

## 2021-01-13 DIAGNOSIS — M47816 Spondylosis without myelopathy or radiculopathy, lumbar region: Secondary | ICD-10-CM | POA: Diagnosis not present

## 2021-01-13 DIAGNOSIS — I10 Essential (primary) hypertension: Secondary | ICD-10-CM | POA: Diagnosis not present

## 2021-01-13 DIAGNOSIS — G250 Essential tremor: Secondary | ICD-10-CM | POA: Diagnosis not present

## 2021-01-13 DIAGNOSIS — I2510 Atherosclerotic heart disease of native coronary artery without angina pectoris: Secondary | ICD-10-CM | POA: Diagnosis not present

## 2021-01-13 DIAGNOSIS — I252 Old myocardial infarction: Secondary | ICD-10-CM | POA: Diagnosis not present

## 2021-01-13 DIAGNOSIS — I255 Ischemic cardiomyopathy: Secondary | ICD-10-CM | POA: Diagnosis not present

## 2021-01-13 DIAGNOSIS — Z8673 Personal history of transient ischemic attack (TIA), and cerebral infarction without residual deficits: Secondary | ICD-10-CM | POA: Diagnosis not present

## 2021-01-13 DIAGNOSIS — E785 Hyperlipidemia, unspecified: Secondary | ICD-10-CM | POA: Diagnosis not present

## 2021-01-13 DIAGNOSIS — I480 Paroxysmal atrial fibrillation: Secondary | ICD-10-CM | POA: Diagnosis not present

## 2021-01-15 DIAGNOSIS — I2510 Atherosclerotic heart disease of native coronary artery without angina pectoris: Secondary | ICD-10-CM | POA: Diagnosis not present

## 2021-01-15 DIAGNOSIS — E785 Hyperlipidemia, unspecified: Secondary | ICD-10-CM | POA: Diagnosis not present

## 2021-01-15 DIAGNOSIS — I10 Essential (primary) hypertension: Secondary | ICD-10-CM | POA: Diagnosis not present

## 2021-01-15 DIAGNOSIS — M47816 Spondylosis without myelopathy or radiculopathy, lumbar region: Secondary | ICD-10-CM | POA: Diagnosis not present

## 2021-01-15 DIAGNOSIS — I252 Old myocardial infarction: Secondary | ICD-10-CM | POA: Diagnosis not present

## 2021-01-15 DIAGNOSIS — Z8673 Personal history of transient ischemic attack (TIA), and cerebral infarction without residual deficits: Secondary | ICD-10-CM | POA: Diagnosis not present

## 2021-01-15 DIAGNOSIS — I255 Ischemic cardiomyopathy: Secondary | ICD-10-CM | POA: Diagnosis not present

## 2021-01-15 DIAGNOSIS — G250 Essential tremor: Secondary | ICD-10-CM | POA: Diagnosis not present

## 2021-01-15 DIAGNOSIS — I480 Paroxysmal atrial fibrillation: Secondary | ICD-10-CM | POA: Diagnosis not present

## 2021-01-21 DIAGNOSIS — M47816 Spondylosis without myelopathy or radiculopathy, lumbar region: Secondary | ICD-10-CM | POA: Diagnosis not present

## 2021-01-21 DIAGNOSIS — E785 Hyperlipidemia, unspecified: Secondary | ICD-10-CM | POA: Diagnosis not present

## 2021-01-21 DIAGNOSIS — I2510 Atherosclerotic heart disease of native coronary artery without angina pectoris: Secondary | ICD-10-CM | POA: Diagnosis not present

## 2021-01-21 DIAGNOSIS — Z8673 Personal history of transient ischemic attack (TIA), and cerebral infarction without residual deficits: Secondary | ICD-10-CM | POA: Diagnosis not present

## 2021-01-21 DIAGNOSIS — I252 Old myocardial infarction: Secondary | ICD-10-CM | POA: Diagnosis not present

## 2021-01-21 DIAGNOSIS — I480 Paroxysmal atrial fibrillation: Secondary | ICD-10-CM | POA: Diagnosis not present

## 2021-01-21 DIAGNOSIS — I10 Essential (primary) hypertension: Secondary | ICD-10-CM | POA: Diagnosis not present

## 2021-01-21 DIAGNOSIS — I255 Ischemic cardiomyopathy: Secondary | ICD-10-CM | POA: Diagnosis not present

## 2021-01-21 DIAGNOSIS — G250 Essential tremor: Secondary | ICD-10-CM | POA: Diagnosis not present

## 2021-01-28 DIAGNOSIS — M47816 Spondylosis without myelopathy or radiculopathy, lumbar region: Secondary | ICD-10-CM | POA: Diagnosis not present

## 2021-01-28 DIAGNOSIS — I255 Ischemic cardiomyopathy: Secondary | ICD-10-CM | POA: Diagnosis not present

## 2021-01-28 DIAGNOSIS — G250 Essential tremor: Secondary | ICD-10-CM | POA: Diagnosis not present

## 2021-01-28 DIAGNOSIS — I252 Old myocardial infarction: Secondary | ICD-10-CM | POA: Diagnosis not present

## 2021-01-28 DIAGNOSIS — I2510 Atherosclerotic heart disease of native coronary artery without angina pectoris: Secondary | ICD-10-CM | POA: Diagnosis not present

## 2021-01-28 DIAGNOSIS — I10 Essential (primary) hypertension: Secondary | ICD-10-CM | POA: Diagnosis not present

## 2021-01-28 DIAGNOSIS — Z8673 Personal history of transient ischemic attack (TIA), and cerebral infarction without residual deficits: Secondary | ICD-10-CM | POA: Diagnosis not present

## 2021-01-28 DIAGNOSIS — E785 Hyperlipidemia, unspecified: Secondary | ICD-10-CM | POA: Diagnosis not present

## 2021-01-28 DIAGNOSIS — I480 Paroxysmal atrial fibrillation: Secondary | ICD-10-CM | POA: Diagnosis not present

## 2021-02-07 DIAGNOSIS — I2510 Atherosclerotic heart disease of native coronary artery without angina pectoris: Secondary | ICD-10-CM | POA: Diagnosis not present

## 2021-02-07 DIAGNOSIS — E785 Hyperlipidemia, unspecified: Secondary | ICD-10-CM | POA: Diagnosis not present

## 2021-02-07 DIAGNOSIS — Z8673 Personal history of transient ischemic attack (TIA), and cerebral infarction without residual deficits: Secondary | ICD-10-CM | POA: Diagnosis not present

## 2021-02-07 DIAGNOSIS — I480 Paroxysmal atrial fibrillation: Secondary | ICD-10-CM | POA: Diagnosis not present

## 2021-02-07 DIAGNOSIS — I10 Essential (primary) hypertension: Secondary | ICD-10-CM | POA: Diagnosis not present

## 2021-02-07 DIAGNOSIS — I252 Old myocardial infarction: Secondary | ICD-10-CM | POA: Diagnosis not present

## 2021-02-07 DIAGNOSIS — G250 Essential tremor: Secondary | ICD-10-CM | POA: Diagnosis not present

## 2021-02-07 DIAGNOSIS — M47816 Spondylosis without myelopathy or radiculopathy, lumbar region: Secondary | ICD-10-CM | POA: Diagnosis not present

## 2021-02-07 DIAGNOSIS — I255 Ischemic cardiomyopathy: Secondary | ICD-10-CM | POA: Diagnosis not present

## 2021-02-17 ENCOUNTER — Other Ambulatory Visit: Payer: Self-pay | Admitting: Cardiovascular Disease

## 2021-03-08 ENCOUNTER — Other Ambulatory Visit: Payer: Self-pay | Admitting: Cardiovascular Disease

## 2021-03-29 ENCOUNTER — Telehealth: Payer: Self-pay | Admitting: Internal Medicine

## 2021-03-29 NOTE — Telephone Encounter (Signed)
Patient requesting advice on the omicron booster  Patient requesting a call back

## 2021-04-02 ENCOUNTER — Ambulatory Visit: Payer: Medicare HMO | Admitting: Podiatry

## 2021-04-02 ENCOUNTER — Encounter: Payer: Self-pay | Admitting: Podiatry

## 2021-04-02 ENCOUNTER — Other Ambulatory Visit: Payer: Self-pay

## 2021-04-02 DIAGNOSIS — B351 Tinea unguium: Secondary | ICD-10-CM | POA: Diagnosis not present

## 2021-04-02 DIAGNOSIS — D689 Coagulation defect, unspecified: Secondary | ICD-10-CM

## 2021-04-02 DIAGNOSIS — M79674 Pain in right toe(s): Secondary | ICD-10-CM

## 2021-04-02 DIAGNOSIS — M79675 Pain in left toe(s): Secondary | ICD-10-CM

## 2021-04-02 NOTE — Telephone Encounter (Signed)
Spoke with Ms. Base today and info given to proceed with booster.

## 2021-04-04 DIAGNOSIS — R7303 Prediabetes: Secondary | ICD-10-CM | POA: Insufficient documentation

## 2021-04-04 NOTE — Patient Instructions (Addendum)
   Get a pair of mild compression sock  - you can get this at a medical supply store or order it online.  Mild compression is about 15-20 mmHg of pressure.    Medications changes include :   none    Please followup in 1 year

## 2021-04-04 NOTE — Progress Notes (Signed)
Subjective:    Patient ID: Henry Cotta., male    DOB: [birth year], 87 y.o.   MRN: [id number]  This visit occurred during the SARS-CoV-2 public health emergency.  Safety protocols were in place, including screening questions prior to the visit, additional usage of staff PPE, and extensive cleaning of exam room while observing appropriate contact time as indicated for disinfecting solutions.     HPI The patient is here for follow up of their chronic medical problems, including CAD, Afib, htn, hld, prediabetes, memory difficulties.  His wife provides the history.   The podiatrist recommended mild compression socks.   His fingernails need to be filed.  He cuts them himself.  He will not his wife file her nails.  Discussed that he can have his home health aide do it or go to a nail salon  He is taking his medications as prescribed.  He is eating well.   Medications and allergies reviewed with patient and updated if appropriate.  Patient Active Problem List   Diagnosis Date Noted   Prediabetes 04/04/2021   Rash 11/11/2020   Allergic rhinitis 11/08/2020   Impaired glucose tolerance 11/08/2020   Pain due to onychomycosis of toenails of both feet 02/14/2020   Impacted cerumen, bilateral 09/14/2019   Unspecified hearing loss, unspecified ear 09/14/2019   Memory difficulties 08/24/2019   Frequent falls 08/11/2018   Poor balance 08/11/2018   Physical deconditioning 08/11/2018   Neck pain 02/24/2017   Cervical paraspinal muscle spasm 12/23/2016   CAD (coronary artery disease) 12/03/2015   Atrial fibrillation ([place]) 11/21/2015   STEMI (ST elevation myocardial infarction) ([place]) 11/19/2015   Personal history of transient ischemic attack (TIA), and cerebral infarction without residual deficits 05/31/2014   Pulmonary embolism ([place]) 05/20/2014   Essential hypertension 05/20/2014   Prostate cancer ([place]) 04/05/2014   Lumbar spondylosis 11/15/2013   Hyperlipidemia    Benign essential  tremor    Lumbago    Palpitations     Current Outpatient Medications on File Prior to Visit  Medication Sig Dispense Refill   acetaminophen (TYLENOL) 325 MG tablet Take 2 tablets (650 mg total) by mouth every 6 (six) hours as needed for mild pain, moderate pain or fever. 60 tablet 0   apixaban (ELIQUIS) 5 MG TABS tablet Take 1 tablet (5 mg total) by mouth 2 (two) times daily. 180 tablet 3   aspirin EC 81 MG tablet Take 1 tablet (81 mg total) by mouth daily. 90 tablet 3   atorvastatin (LIPITOR) 40 MG tablet Take 1 tablet (40 mg total) by mouth daily. 90 tablet 2   Cholecalciferol (VITAMIN D-3) 1000 UNITS CAPS Take 1 capsule by mouth daily. Take one tablet by mouth once daily.     docusate sodium (COLACE) 100 MG capsule Take 100 mg by mouth daily.     lisinopril (ZESTRIL) 2.5 MG tablet TAKE 1 TABLET BY MOUTH EVERY DAY 90 tablet 2   Multiple Vitamin (MULTIVITAMIN) tablet Take 1 tablet by mouth daily.     nitroGLYCERIN (NITROSTAT) 0.4 MG SL tablet Place 1 tablet (0.4 mg total) under the tongue every 5 (five) minutes as needed for chest pain. 25 tablet 3   No current facility-administered medications on file prior to visit.    Past Medical History:  Diagnosis Date   CAD (coronary artery disease)    a. STEMI 6/17: LHC - oLAD 10, mLAD 50, dLAD 20, D2 inf subbranch 70 + sup subbranch 100, pRCA 20, oRPDA 40, EF  35-45% >> PCI:  2.25 x 12 mm resolute integrity DES to the superior subbranch of D2    Essential and other specified forms of tremor    External hemorrhoids without mention of complication    History of ST elevation myocardial infarction (STEMI)    a. Ant-Lat STEMI >> DES to D2 subbranch   History of stroke    History of TIA (transient ischemic attack)    HTN (hypertension)    Hyperlipidemia    Ischemic cardiomyopathy    a. Echo 6/17: Mid and distal anterior/inferior, septal and apical HK, mild concentric LVH, EF 30-35%, mild LAE  //  b. Echo 9/17 mild focal basal septal hypertrophy,  EF 55-60%, normal wall motion, grade 1 diastolic dysfunction, mildly dilated aortic root (37 mm), normal RVSF, PASP 29 mmHg   Lumbago    Malignant neoplasm of prostate (HCC)    Nontraumatic rupture of tendons of biceps (long head)    Paroxysmal atrial fibrillation (HCC)    during admit for STEMI 6/17 >> triple Rx high risk >> ASA + Plavix x 30 days post MI, then Eliquis + Plavix   Sebaceous cyst    Supraventricular premature beats    Unspecified anomaly of tooth position     Past Surgical History:  Procedure Laterality Date   APPENDECTOMY  1942   BACK SURGERY     with sciatica   CARDIAC CATHETERIZATION N/A 11/19/2015   Procedure: Left Heart Cath and Coronary Angiography;  Surgeon: Burnell Blanks, MD;  Location: [facility];  Service: Cardiovascular;  Laterality: N/A;   CARDIAC CATHETERIZATION N/A 11/19/2015   Procedure: Coronary Stent Intervention;  Surgeon: Burnell Blanks, MD;  Location: [facility];  Service: Cardiovascular;  Laterality: N/A;   prostate cancer     SPINE SURGERY N/A 2000   [place]    Social History   Socioeconomic History   Marital status: Married    Spouse name: Not on file   Number of children: 2   Years of education: Not on file   Highest education level: Not on file  Occupational History   Occupation: retired  Tobacco Use   Smoking status: Never   Smokeless tobacco: Never  Vaping Use   Vaping Use: Never used  Substance and Sexual Activity   Alcohol use: No    Alcohol/week: 0.0 standard drinks   Drug use: No   Sexual activity: Not Currently  Other Topics Concern   Not on file  Social History Narrative   Not on file   Social Determinants of Health   Financial Resource Strain: Low Risk    Difficulty of Paying Living Expenses: Not hard at all  Food Insecurity: No Food Insecurity   Worried About Charity fundraiser in the Last Year: Never true   [place] in the Last Year: Never true  Transportation Needs: No  Transportation Needs   Lack of Transportation (Medical): No   Lack of Transportation (Non-Medical): No  Physical Activity: Inactive   Days of Exercise per Week: 0 days   Minutes of Exercise per Session: 0 min  Stress: No Stress Concern Present   Feeling of Stress : Not at all  Social Connections: Socially Integrated   Frequency of Communication with Friends and Family: More than three times a week   Frequency of Social Gatherings with Friends and Family: Once a week   Attends Religious Services: More than 4 times per year   Active Member of Clubs or  Organizations: Yes   Attends Music therapist: More than 4 times per year   Marital Status: Married    Family History  Problem Relation Age of Onset   Cancer Father 8       brain tumor    Review of Systems  Constitutional:  Negative for fever.  Respiratory:  Negative for cough, shortness of breath and wheezing.   Cardiovascular:  Positive for leg swelling. Negative for chest pain and palpitations.  Gastrointestinal:  Negative for abdominal pain.  Neurological:  Negative for light-headedness and headaches.      Objective:   Vitals:   04/05/21 0934  BP: 104/82  Pulse: (!) 58  Temp: 98 F (36.7 C)  SpO2: 92%   BP Readings from Last 3 Encounters:  04/05/21 104/82  11/29/20 124/80  11/08/20 104/62   Wt Readings from Last 3 Encounters:  04/05/21 141 lb (64 kg)  11/29/20 145 lb (65.8 kg)  11/08/20 143 lb (64.9 kg)   Body mass index is 20.23 kg/m.   Physical Exam    Constitutional: Appears well-developed and well-nourished. No distress.  HENT:  Head: Normocephalic and atraumatic.  Neck: Neck supple. No tracheal deviation present. No thyromegaly present.  No cervical lymphadenopathy Cardiovascular: Normal rate, regular rhythm and normal heart sounds.   2/6 systolic murmur heard. No carotid bruit .  Mild bilateral lower extremity pitting edema Pulmonary/Chest: Effort normal and breath sounds normal. No  respiratory distress. No has no wheezes. No rales.  Skin: Skin is warm and dry. Not diaphoretic.  Psychiatric: Normal mood and affect. Behavior is normal.      Assessment & Plan:    I spent 20 minutes dedicated to the care of this patient on the date of this encounter including review of recent labs, imaging and procedures, speciality notes, obtaining history, communicating with the patient and documenting clinical information in the EHR   See Problem List for Assessment and Plan of chronic medical problems.

## 2021-04-05 ENCOUNTER — Other Ambulatory Visit: Payer: Self-pay

## 2021-04-05 ENCOUNTER — Ambulatory Visit (INDEPENDENT_AMBULATORY_CARE_PROVIDER_SITE_OTHER): Payer: Medicare HMO | Admitting: Internal Medicine

## 2021-04-05 ENCOUNTER — Encounter: Payer: Self-pay | Admitting: Internal Medicine

## 2021-04-05 ENCOUNTER — Telehealth: Payer: Self-pay | Admitting: Internal Medicine

## 2021-04-05 VITALS — BP 104/82 | HR 58 | Temp 98.0°F | Ht 70.0 in | Wt 141.0 lb

## 2021-04-05 DIAGNOSIS — R7303 Prediabetes: Secondary | ICD-10-CM | POA: Diagnosis not present

## 2021-04-05 DIAGNOSIS — E782 Mixed hyperlipidemia: Secondary | ICD-10-CM | POA: Diagnosis not present

## 2021-04-05 DIAGNOSIS — I480 Paroxysmal atrial fibrillation: Secondary | ICD-10-CM | POA: Diagnosis not present

## 2021-04-05 DIAGNOSIS — R600 Localized edema: Secondary | ICD-10-CM | POA: Diagnosis not present

## 2021-04-05 DIAGNOSIS — I10 Essential (primary) hypertension: Secondary | ICD-10-CM

## 2021-04-05 DIAGNOSIS — R413 Other amnesia: Secondary | ICD-10-CM

## 2021-04-05 DIAGNOSIS — I25118 Atherosclerotic heart disease of native coronary artery with other forms of angina pectoris: Secondary | ICD-10-CM | POA: Diagnosis not present

## 2021-04-05 NOTE — Telephone Encounter (Signed)
Patient spouse calling in  [place] listed in patients chart has the incorrect date on it  Currently has date of 03/11/21 but needs to be corrected to 04/02/21  Wanted to make [place] aware so we could correct & update

## 2021-04-05 NOTE — Assessment & Plan Note (Signed)
Chronic Will get mild compression socks and have him wear them daily-discussed that they can get this at a medical supply store

## 2021-04-05 NOTE — Assessment & Plan Note (Signed)
Chronic Mild Will monitor, but he is low risk for developing diabetes

## 2021-04-05 NOTE — Assessment & Plan Note (Signed)
Chronic Management per cardiology On Eliquis 5 mg twice daily Denies recent falls His wife deferred blood work

## 2021-04-05 NOTE — Telephone Encounter (Signed)
Updated in chart

## 2021-04-05 NOTE — Assessment & Plan Note (Signed)
Chronic No symptoms consistent with angina Management per cardiology On aspirin 81 mg, Eliquis 5 mg twice daily, atorvastatin 40 mg daily, lisinopril 2.5 mg daily

## 2021-04-05 NOTE — Assessment & Plan Note (Signed)
Chronic Continue atorvastatin 40 mg daily 

## 2021-04-05 NOTE — Assessment & Plan Note (Signed)
Chronic Overall stable and doing well He is eating well and sleeping well His balance is poor and he uses a Rollator No recent falls

## 2021-04-06 NOTE — Progress Notes (Signed)
  Subjective:  Patient ID: Henry Cotta., male    DOB: January 25, 1927,  MRN: [id number]  Henry Cotta. presents to clinic today for at risk foot care with h/o coagulation defect and painful thick toenails that are difficult to trim. Pain interferes with ambulation. Aggravating factors include wearing enclosed shoe gear. Pain is relieved with periodic professional debridement.  Patient is accompanied by his wife on today's visit. Wife would like to schedule his next appointment for six months.  PCP is [name], MD , and last visit was May, 2022.  No Known Allergies  Review of Systems: Negative except as noted in the HPI. Objective:   Constitutional Henry T[place]worth. is a pleasant 87 y.o. Caucasian male, WD, WN in NAD. AAO x 3.   Vascular  No cyanosis or clubbing noted.CFT <3 seconds b/l LE. Faintly palpable DP pulses b/l. Nonpalpable PT pulses b/l. Pedal hair absent b/l. Skin temperature gradient WNL b/l. No pain with calf compression b/l. No edema b/l LE. Lower extremity skin temperature gradient warm to cool.  Neurologic Normal speech. Oriented to person, place, and time. Protective sensation intact 5/5 intact bilaterally with 10g monofilament b/l. Vibratory sensation intact b/l.  Dermatologic Pedal integument with normal turgor, texture and tone b/l LE. No open wounds b/l. No interdigital macerations b/l. Toenails 1-5 b/l elongated, thickened, discolored with subungual debris. +Tenderness with dorsal palpation of nailplates. No hyperkeratotic or porokeratotic lesions present. Toenails 1-5 b/l elongated, discolored, dystrophic, thickened, crumbly with subungual debris and tenderness to dorsal palpation.  Orthopedic: Normal muscle strength 5/5 to all lower extremity muscle groups bilaterally. No gross bony deformities b/l lower extremities.   Radiographs: None Assessment:   1. Pain due to onychomycosis of toenails of both feet   2. Coagulation defect [facility])    Plan:  Patient was  evaluated and treated and all questions answered. Consent given for treatment as described below: -No new findings. No new orders. -Patient to continue soft, supportive shoe gear daily. -Toenails 1-5 b/l were debrided in length and girth with sterile nail nippers and dremel without iatrogenic bleeding.  -Patient/POA to call should there be question/concern in the interim.  Return in about 6 months (around 09/30/2021).  Marzetta Board, DPM

## 2021-05-13 DIAGNOSIS — R531 Weakness: Secondary | ICD-10-CM | POA: Diagnosis not present

## 2021-05-13 DIAGNOSIS — R000 Tachycardia, unspecified: Secondary | ICD-10-CM | POA: Diagnosis not present

## 2021-05-13 DIAGNOSIS — U071 COVID-19: Secondary | ICD-10-CM | POA: Diagnosis not present

## 2021-05-14 ENCOUNTER — Encounter (HOSPITAL_COMMUNITY): Payer: Self-pay

## 2021-05-14 ENCOUNTER — Inpatient Hospital Stay (HOSPITAL_COMMUNITY)
Admission: EM | Admit: 2021-05-14 | Discharge: 2021-05-23 | DRG: 177 | Disposition: A | Discharge status: Skilled Nursing Facility | Payer: Medicare HMO | Attending: Internal Medicine | Admitting: Internal Medicine

## 2021-05-14 ENCOUNTER — Other Ambulatory Visit: Payer: Self-pay

## 2021-05-14 ENCOUNTER — Telehealth: Payer: Self-pay | Admitting: Internal Medicine

## 2021-05-14 DIAGNOSIS — R413 Other amnesia: Secondary | ICD-10-CM | POA: Diagnosis present

## 2021-05-14 DIAGNOSIS — Z7901 Long term (current) use of anticoagulants: Secondary | ICD-10-CM | POA: Diagnosis not present

## 2021-05-14 DIAGNOSIS — I480 Paroxysmal atrial fibrillation: Secondary | ICD-10-CM | POA: Diagnosis present

## 2021-05-14 DIAGNOSIS — N289 Disorder of kidney and ureter, unspecified: Secondary | ICD-10-CM | POA: Diagnosis not present

## 2021-05-14 DIAGNOSIS — Y92009 Unspecified place in unspecified non-institutional (private) residence as the place of occurrence of the external cause: Secondary | ICD-10-CM | POA: Diagnosis not present

## 2021-05-14 DIAGNOSIS — F05 Delirium due to known physiological condition: Secondary | ICD-10-CM | POA: Diagnosis not present

## 2021-05-14 DIAGNOSIS — E860 Dehydration: Secondary | ICD-10-CM | POA: Diagnosis present

## 2021-05-14 DIAGNOSIS — E871 Hypo-osmolality and hyponatremia: Secondary | ICD-10-CM | POA: Diagnosis not present

## 2021-05-14 DIAGNOSIS — Z8546 Personal history of malignant neoplasm of prostate: Secondary | ICD-10-CM | POA: Diagnosis not present

## 2021-05-14 DIAGNOSIS — E785 Hyperlipidemia, unspecified: Secondary | ICD-10-CM | POA: Diagnosis present

## 2021-05-14 DIAGNOSIS — Z86711 Personal history of pulmonary embolism: Secondary | ICD-10-CM

## 2021-05-14 DIAGNOSIS — R627 Adult failure to thrive: Secondary | ICD-10-CM | POA: Diagnosis present

## 2021-05-14 DIAGNOSIS — W19XXXA Unspecified fall, initial encounter: Secondary | ICD-10-CM | POA: Diagnosis not present

## 2021-05-14 DIAGNOSIS — R7401 Elevation of levels of liver transaminase levels: Secondary | ICD-10-CM | POA: Diagnosis present

## 2021-05-14 DIAGNOSIS — I4891 Unspecified atrial fibrillation: Secondary | ICD-10-CM | POA: Diagnosis not present

## 2021-05-14 DIAGNOSIS — Z79899 Other long term (current) drug therapy: Secondary | ICD-10-CM | POA: Diagnosis not present

## 2021-05-14 DIAGNOSIS — R531 Weakness: Secondary | ICD-10-CM

## 2021-05-14 DIAGNOSIS — R000 Tachycardia, unspecified: Secondary | ICD-10-CM | POA: Diagnosis not present

## 2021-05-14 DIAGNOSIS — J1282 Pneumonia due to coronavirus disease 2019: Secondary | ICD-10-CM | POA: Diagnosis present

## 2021-05-14 DIAGNOSIS — I10 Essential (primary) hypertension: Secondary | ICD-10-CM | POA: Diagnosis present

## 2021-05-14 DIAGNOSIS — S0990XA Unspecified injury of head, initial encounter: Secondary | ICD-10-CM | POA: Diagnosis not present

## 2021-05-14 DIAGNOSIS — I160 Hypertensive urgency: Secondary | ICD-10-CM | POA: Diagnosis present

## 2021-05-14 DIAGNOSIS — I2510 Atherosclerotic heart disease of native coronary artery without angina pectoris: Secondary | ICD-10-CM | POA: Diagnosis present

## 2021-05-14 DIAGNOSIS — R0902 Hypoxemia: Secondary | ICD-10-CM | POA: Diagnosis not present

## 2021-05-14 DIAGNOSIS — J309 Allergic rhinitis, unspecified: Secondary | ICD-10-CM | POA: Diagnosis not present

## 2021-05-14 DIAGNOSIS — R509 Fever, unspecified: Secondary | ICD-10-CM | POA: Diagnosis not present

## 2021-05-14 DIAGNOSIS — Z743 Need for continuous supervision: Secondary | ICD-10-CM | POA: Diagnosis not present

## 2021-05-14 DIAGNOSIS — E782 Mixed hyperlipidemia: Secondary | ICD-10-CM | POA: Diagnosis not present

## 2021-05-14 DIAGNOSIS — M6282 Rhabdomyolysis: Secondary | ICD-10-CM | POA: Diagnosis present

## 2021-05-14 DIAGNOSIS — F0390 Unspecified dementia without behavioral disturbance: Secondary | ICD-10-CM | POA: Diagnosis not present

## 2021-05-14 DIAGNOSIS — Z66 Do not resuscitate: Secondary | ICD-10-CM | POA: Diagnosis present

## 2021-05-14 DIAGNOSIS — Z8673 Personal history of transient ischemic attack (TIA), and cerebral infarction without residual deficits: Secondary | ICD-10-CM | POA: Diagnosis not present

## 2021-05-14 DIAGNOSIS — Z9181 History of falling: Secondary | ICD-10-CM

## 2021-05-14 DIAGNOSIS — U071 COVID-19: Secondary | ICD-10-CM | POA: Diagnosis present

## 2021-05-14 DIAGNOSIS — M4800 Spinal stenosis, site unspecified: Secondary | ICD-10-CM | POA: Diagnosis present

## 2021-05-14 DIAGNOSIS — I213 ST elevation (STEMI) myocardial infarction of unspecified site: Secondary | ICD-10-CM | POA: Diagnosis not present

## 2021-05-14 DIAGNOSIS — I255 Ischemic cardiomyopathy: Secondary | ICD-10-CM | POA: Diagnosis present

## 2021-05-14 DIAGNOSIS — Z7982 Long term (current) use of aspirin: Secondary | ICD-10-CM | POA: Diagnosis not present

## 2021-05-14 DIAGNOSIS — R0600 Dyspnea, unspecified: Secondary | ICD-10-CM | POA: Diagnosis not present

## 2021-05-14 DIAGNOSIS — I517 Cardiomegaly: Secondary | ICD-10-CM | POA: Diagnosis not present

## 2021-05-14 DIAGNOSIS — R410 Disorientation, unspecified: Secondary | ICD-10-CM | POA: Diagnosis not present

## 2021-05-14 DIAGNOSIS — N179 Acute kidney failure, unspecified: Secondary | ICD-10-CM | POA: Diagnosis present

## 2021-05-14 DIAGNOSIS — R051 Acute cough: Secondary | ICD-10-CM

## 2021-05-14 DIAGNOSIS — G250 Essential tremor: Secondary | ICD-10-CM | POA: Diagnosis present

## 2021-05-14 DIAGNOSIS — I252 Old myocardial infarction: Secondary | ICD-10-CM | POA: Diagnosis not present

## 2021-05-14 LAB — BASIC METABOLIC PANEL
Anion gap: 8 (ref 5–15)
BUN: 25 mg/dL — ABNORMAL HIGH (ref 8–23)
CO2: 25 mmol/L (ref 22–32)
Calcium: 8.7 mg/dL — ABNORMAL LOW (ref 8.9–10.3)
Chloride: 102 mmol/L (ref 98–111)
Creatinine, Ser: 1.32 mg/dL — ABNORMAL HIGH (ref 0.61–1.24)
GFR, Estimated: 51 mL/min — ABNORMAL LOW (ref >60)
Glucose, Bld: 141 mg/dL — ABNORMAL HIGH (ref 70–99)
Potassium: 4.1 mmol/L (ref 3.5–5.1)
Sodium: 135 mmol/L (ref 135–145)

## 2021-05-14 LAB — RESP PANEL BY RT-PCR (FLU A&B, COVID) ARPGX2
Influenza A by PCR: NEGATIVE
Influenza B by PCR: NEGATIVE
SARS Coronavirus 2 by RT PCR: POSITIVE — AB

## 2021-05-14 LAB — CBC
HCT: 48.1 % (ref 39.0–52.0)
Hemoglobin: 16 g/dL (ref 13.0–17.0)
MCH: 31.4 pg (ref 26.0–34.0)
MCHC: 33.3 g/dL (ref 30.0–36.0)
MCV: 94.3 fL (ref 80.0–100.0)
Platelets: 162 10*3/uL (ref 150–400)
RBC: 5.1 MIL/uL (ref 4.22–5.81)
RDW: 13.5 % (ref 11.5–15.5)
WBC: 7.3 10*3/uL (ref 4.0–10.5)
nRBC: 0 % (ref 0.0–0.2)

## 2021-05-14 NOTE — Telephone Encounter (Signed)
Pt wife has called and states pt temp last night was 102. Son came over this morning to sit him up to take medication, as he was lying straight on back. States she would like a call back from nurse for advice on what to do.    Callback #- [phone]

## 2021-05-14 NOTE — ED Triage Notes (Signed)
Pt BIBA via [facility]. Pt took at home COVID test that came back positive. Per medic, family endorsed patient has decreased mobility.  1000mg  tylenol given PTA by medic d/t temp 101.0, BP:162/100, HR:60, CBG: 117, 95% (RA), RR: 22.

## 2021-05-14 NOTE — Telephone Encounter (Signed)
Spoke with patient's wife today. 

## 2021-05-14 NOTE — Telephone Encounter (Signed)
Patient wife Arbie Cookey calling in  Patient covid+ via at home test 12.13.22  Currently only experiencing cough & slight nose running symptoms  Wants to know if provider recommends anti-viral or any other OTC meds  Please call [phone]

## 2021-05-14 NOTE — ED Provider Notes (Signed)
Emergency Medicine Provider Triage Evaluation Note  Henry Franklin. , a 87 y.o. male  was evaluated in triage.  Patient was brought in via EMS family's request because patient tested positive for COVID and had decreased energy over the past day.  He denies shortness of breath, chest pain, lightheadedness, palpitations.  Patient is initial blood pressure was elevated so was rechecked.  Recheck blood pressures have consistently been in the 90s over 50s.  Patient's repeat heart rate was in the 40s which is consistent with a manually palpated heart rate.  Will get EKG.  Review of Systems  Positive: As above Negative: As above  Physical Exam  BP (!) 99/50    Pulse (!) 46    Temp 98.9 F (37.2 C)    Resp 14    Ht 5\' 11"  (1.803 m)    Wt 83 kg    SpO2 96%    BMI 25.52 kg/m  Gen:   Awake, no distress   Resp:  Normal effort  MSK:   Moves extremities without difficulty  Other:    Medical Decision Making  Medically screening exam initiated at 8:07 PM.  Appropriate orders placed.  [name] was informed that the remainder of the evaluation will be completed by another provider, this initial triage assessment does not replace that evaluation, and the importance of remaining in the ED until their evaluation is complete.     Evlyn Courier, PA-C 05/14/21 2009    Lacretia Leigh, MD 05/17/21 [phone]

## 2021-05-15 ENCOUNTER — Encounter (HOSPITAL_COMMUNITY): Payer: Self-pay | Admitting: Internal Medicine

## 2021-05-15 ENCOUNTER — Emergency Department (HOSPITAL_COMMUNITY): Payer: Medicare HMO

## 2021-05-15 DIAGNOSIS — U071 COVID-19: Principal | ICD-10-CM

## 2021-05-15 DIAGNOSIS — R509 Fever, unspecified: Secondary | ICD-10-CM | POA: Diagnosis present

## 2021-05-15 DIAGNOSIS — S0990XA Unspecified injury of head, initial encounter: Secondary | ICD-10-CM | POA: Diagnosis not present

## 2021-05-15 DIAGNOSIS — I160 Hypertensive urgency: Secondary | ICD-10-CM | POA: Diagnosis not present

## 2021-05-15 DIAGNOSIS — W19XXXA Unspecified fall, initial encounter: Secondary | ICD-10-CM

## 2021-05-15 DIAGNOSIS — R0600 Dyspnea, unspecified: Secondary | ICD-10-CM | POA: Diagnosis not present

## 2021-05-15 DIAGNOSIS — I480 Paroxysmal atrial fibrillation: Secondary | ICD-10-CM | POA: Diagnosis not present

## 2021-05-15 DIAGNOSIS — E782 Mixed hyperlipidemia: Secondary | ICD-10-CM

## 2021-05-15 DIAGNOSIS — I517 Cardiomegaly: Secondary | ICD-10-CM | POA: Diagnosis not present

## 2021-05-15 DIAGNOSIS — Z86711 Personal history of pulmonary embolism: Secondary | ICD-10-CM | POA: Diagnosis not present

## 2021-05-15 DIAGNOSIS — R531 Weakness: Secondary | ICD-10-CM | POA: Diagnosis not present

## 2021-05-15 DIAGNOSIS — N289 Disorder of kidney and ureter, unspecified: Secondary | ICD-10-CM

## 2021-05-15 DIAGNOSIS — G250 Essential tremor: Secondary | ICD-10-CM

## 2021-05-15 DIAGNOSIS — R413 Other amnesia: Secondary | ICD-10-CM | POA: Diagnosis not present

## 2021-05-15 DIAGNOSIS — Y92009 Unspecified place in unspecified non-institutional (private) residence as the place of occurrence of the external cause: Secondary | ICD-10-CM

## 2021-05-15 LAB — BASIC METABOLIC PANEL
Anion gap: 10 (ref 5–15)
BUN: 27 mg/dL — ABNORMAL HIGH (ref 8–23)
CO2: 22 mmol/L (ref 22–32)
Calcium: 8.2 mg/dL — ABNORMAL LOW (ref 8.9–10.3)
Chloride: 104 mmol/L (ref 98–111)
Creatinine, Ser: 1.25 mg/dL — ABNORMAL HIGH (ref 0.61–1.24)
GFR, Estimated: 54 mL/min — ABNORMAL LOW (ref >60)
Glucose, Bld: 137 mg/dL — ABNORMAL HIGH (ref 70–99)
Potassium: 4 mmol/L (ref 3.5–5.1)
Sodium: 136 mmol/L (ref 135–145)

## 2021-05-15 LAB — URINALYSIS, MICROSCOPIC (REFLEX)

## 2021-05-15 LAB — HEPATIC FUNCTION PANEL
ALT: 48 U/L — ABNORMAL HIGH (ref 0–44)
AST: 149 U/L — ABNORMAL HIGH (ref 15–41)
Albumin: 3.2 g/dL — ABNORMAL LOW (ref 3.5–5.0)
Alkaline Phosphatase: 61 U/L (ref 38–126)
Bilirubin, Direct: 0.2 mg/dL (ref 0.0–0.2)
Indirect Bilirubin: 0.5 mg/dL (ref 0.3–0.9)
Total Bilirubin: 0.7 mg/dL (ref 0.3–1.2)
Total Protein: 5.9 g/dL — ABNORMAL LOW (ref 6.5–8.1)

## 2021-05-15 LAB — FERRITIN: Ferritin: 91 ng/mL (ref 24–336)

## 2021-05-15 LAB — PROCALCITONIN: Procalcitonin: 0.1 ng/mL

## 2021-05-15 LAB — LACTIC ACID, PLASMA
Lactic Acid, Venous: 1.2 mmol/L (ref 0.5–1.9)
Lactic Acid, Venous: 1.2 mmol/L (ref 0.5–1.9)

## 2021-05-15 LAB — URINALYSIS, ROUTINE W REFLEX MICROSCOPIC
Bilirubin Urine: NEGATIVE
Glucose, UA: NEGATIVE mg/dL
Ketones, ur: 15 mg/dL — AB
Leukocytes,Ua: NEGATIVE
Nitrite: NEGATIVE
Protein, ur: NEGATIVE mg/dL
Specific Gravity, Urine: 1.025 (ref 1.005–1.030)
pH: 5.5 (ref 5.0–8.0)

## 2021-05-15 LAB — FIBRINOGEN: Fibrinogen: 423 mg/dL (ref 210–475)

## 2021-05-15 LAB — BRAIN NATRIURETIC PEPTIDE: B Natriuretic Peptide: 224.7 pg/mL — ABNORMAL HIGH (ref 0.0–100.0)

## 2021-05-15 LAB — C-REACTIVE PROTEIN: CRP: 5.4 mg/dL — ABNORMAL HIGH (ref <1.0)

## 2021-05-15 LAB — HEPATITIS B SURFACE ANTIGEN: Hepatitis B Surface Ag: NONREACTIVE

## 2021-05-15 LAB — LACTATE DEHYDROGENASE: LDH: 302 U/L — ABNORMAL HIGH (ref 98–192)

## 2021-05-15 LAB — CK: Total CK: 2527 U/L — ABNORMAL HIGH (ref 49–397)

## 2021-05-15 LAB — D-DIMER, QUANTITATIVE: D-Dimer, Quant: 1.62 ug/mL-FEU — ABNORMAL HIGH (ref 0.00–0.50)

## 2021-05-15 MED ORDER — ALBUTEROL SULFATE HFA 108 (90 BASE) MCG/ACT IN AERS
2.0000 | INHALATION_SPRAY | Freq: Four times a day (QID) | RESPIRATORY_TRACT | Status: DC
  Administered 2021-05-15 – 2021-05-17 (×4): 2 via RESPIRATORY_TRACT
  Filled 2021-05-15 (×2): qty 6.7

## 2021-05-15 MED ORDER — ASCORBIC ACID 500 MG PO TABS
500.0000 mg | ORAL_TABLET | Freq: Every day | ORAL | Status: DC
  Administered 2021-05-15 – 2021-05-23 (×9): 500 mg via ORAL
  Filled 2021-05-15 (×9): qty 1

## 2021-05-15 MED ORDER — SODIUM CHLORIDE 0.9 % IV SOLN
100.0000 mg | Freq: Every day | INTRAVENOUS | Status: AC
  Administered 2021-05-16 – 2021-05-19 (×4): 100 mg via INTRAVENOUS
  Filled 2021-05-15: qty 20
  Filled 2021-05-15: qty 100
  Filled 2021-05-15: qty 20
  Filled 2021-05-15: qty 100

## 2021-05-15 MED ORDER — LISINOPRIL 5 MG PO TABS
2.5000 mg | ORAL_TABLET | Freq: Every day | ORAL | Status: DC
  Administered 2021-05-15 – 2021-05-23 (×9): 2.5 mg via ORAL
  Filled 2021-05-15 (×10): qty 1

## 2021-05-15 MED ORDER — HYDROCOD POLST-CPM POLST ER 10-8 MG/5ML PO SUER
5.0000 mL | Freq: Two times a day (BID) | ORAL | Status: DC | PRN
  Administered 2021-05-15: 5 mL via ORAL
  Filled 2021-05-15: qty 5

## 2021-05-15 MED ORDER — ZINC SULFATE 220 (50 ZN) MG PO CAPS
220.0000 mg | ORAL_CAPSULE | Freq: Every day | ORAL | Status: DC
  Administered 2021-05-15 – 2021-05-23 (×9): 220 mg via ORAL
  Filled 2021-05-15 (×9): qty 1

## 2021-05-15 MED ORDER — HYDRALAZINE HCL 20 MG/ML IJ SOLN: 5.0000 mg | INTRAMUSCULAR | Status: DC | PRN

## 2021-05-15 MED ORDER — SODIUM CHLORIDE 0.9% FLUSH
3.0000 mL | Freq: Two times a day (BID) | INTRAVENOUS | Status: DC
  Administered 2021-05-15 – 2021-05-23 (×13): 3 mL via INTRAVENOUS

## 2021-05-15 MED ORDER — GUAIFENESIN-DM 100-10 MG/5ML PO SYRP
10.0000 mL | ORAL_SOLUTION | ORAL | Status: DC | PRN
  Administered 2021-05-16: 10 mL via ORAL
  Filled 2021-05-15: qty 10

## 2021-05-15 MED ORDER — SODIUM CHLORIDE 0.9 % IV SOLN
200.0000 mg | Freq: Once | INTRAVENOUS | Status: AC
  Administered 2021-05-15: 16:00:00 200 mg via INTRAVENOUS
  Filled 2021-05-15: qty 40

## 2021-05-15 MED ORDER — SODIUM CHLORIDE 0.9 % IV SOLN: Freq: Once | INTRAVENOUS | Status: AC

## 2021-05-15 MED ORDER — APIXABAN 5 MG PO TABS
5.0000 mg | ORAL_TABLET | Freq: Two times a day (BID) | ORAL | Status: DC
  Administered 2021-05-15 – 2021-05-23 (×16): 5 mg via ORAL
  Filled 2021-05-15 (×16): qty 1

## 2021-05-15 MED ORDER — ENOXAPARIN SODIUM 40 MG/0.4ML IJ SOSY: 40.0000 mg | PREFILLED_SYRINGE | INTRAMUSCULAR | Status: DC

## 2021-05-15 NOTE — ED Provider Notes (Signed)
[facility] EMERGENCY DEPARTMENT Provider Note   CSN: [id number] Arrival date & time: 05/14/21  9983     History Chief Complaint  Patient presents with   Cough    Henry Nila. is a 87 y.o. male.  87 year old male with prior medical history as detailed below presents for evaluation.  Patient with significant weakness and fatigue noted over the last 24 hours.  At baseline, patient is able to ambulate around his house with his walker.  He lives with his wife.  Family checks on him regularly.  Yesterday the patient was unable to ambulate.  He did have a fall.  This fall occurred while attempting to ambulate.  Patient is unsure as to whether he actually hit his head.  He denies passing out.  He reports mild cough.  He denies fever at home.  Initial temp with EMS was 101.  His COVID test is positive today.  He reports prior vaccination.  Son is uncertain as to whether the patient had most recent COVID booster.  The history is provided by the patient, medical records and a relative.  Illness Location:  Weakness, fatigue, fever, COVID positive, fall Severity:  Moderate Onset quality:  Gradual Duration:  1 day Timing:  Rare Progression:  Unchanged Chronicity:  New     Past Medical History:  Diagnosis Date   CAD (coronary artery disease)    a. STEMI 6/17: LHC - oLAD 10, mLAD 50, dLAD 20, D2 inf subbranch 70 + sup subbranch 100, pRCA 20, oRPDA 40, EF 35-45% >> PCI:  2.25 x 12 mm resolute integrity DES to the superior subbranch of D2    Essential and other specified forms of tremor    External hemorrhoids without mention of complication    History of ST elevation myocardial infarction (STEMI)    a. Ant-Lat STEMI >> DES to D2 subbranch   History of stroke    History of TIA (transient ischemic attack)    HTN (hypertension)    Hyperlipidemia    Ischemic cardiomyopathy    a. Echo 6/17: Mid and distal anterior/inferior, septal and apical HK, mild concentric LVH,  EF 30-35%, mild LAE  //  b. Echo 9/17 mild focal basal septal hypertrophy, EF 55-60%, normal wall motion, grade 1 diastolic dysfunction, mildly dilated aortic root (37 mm), normal RVSF, PASP 29 mmHg   Lumbago    Malignant neoplasm of prostate (HCC)    Nontraumatic rupture of tendons of biceps (long head)    Paroxysmal atrial fibrillation (HCC)    during admit for STEMI 6/17 >> triple Rx high risk >> ASA + Plavix x 30 days post MI, then Eliquis + Plavix   Sebaceous cyst    Supraventricular premature beats    Unspecified anomaly of tooth position     Patient Active Problem List   Diagnosis Date Noted   Bilateral leg edema 04/05/2021   Prediabetes 04/04/2021   Rash 11/11/2020   Allergic rhinitis 11/08/2020   Impaired glucose tolerance 11/08/2020   Pain due to onychomycosis of toenails of both feet 02/14/2020   Impacted cerumen, bilateral 09/14/2019   Unspecified hearing loss, unspecified ear 09/14/2019   Memory difficulties 08/24/2019   Frequent falls 08/11/2018   Poor balance 08/11/2018   Physical deconditioning 08/11/2018   Neck pain 02/24/2017   Cervical paraspinal muscle spasm 12/23/2016   CAD (coronary artery disease) 12/03/2015   Atrial fibrillation ([place]) 11/21/2015   STEMI (ST elevation myocardial infarction) ([place]) 11/19/2015   Personal history of  transient ischemic attack (TIA), and cerebral infarction without residual deficits 05/31/2014   Pulmonary embolism ([place]) 05/20/2014   Prostate cancer ([place]) 04/05/2014   Lumbar spondylosis 11/15/2013   Hyperlipidemia    Benign essential tremor    Lumbago    Palpitations     Past Surgical History:  Procedure Laterality Date   APPENDECTOMY  1942   BACK SURGERY     with sciatica   CARDIAC CATHETERIZATION N/A 11/19/2015   Procedure: Left Heart Cath and Coronary Angiography;  Surgeon: Burnell Blanks, MD;  Location: [place] [facility];  Service: Cardiovascular;  Laterality: N/A;   CARDIAC CATHETERIZATION N/A 11/19/2015    Procedure: Coronary Stent Intervention;  Surgeon: Burnell Blanks, MD;  Location: [place] [facility];  Service: Cardiovascular;  Laterality: N/A;   prostate cancer     SPINE SURGERY N/A 2000   [place]       Family History  Problem Relation Age of Onset   Cancer Father 18       brain tumor    Social History   Tobacco Use   Smoking status: Never   Smokeless tobacco: Never  Vaping Use   Vaping Use: Never used  Substance Use Topics   Alcohol use: No    Alcohol/week: 0.0 standard drinks   Drug use: No    Home Medications Prior to Admission medications   Medication Sig Start Date End Date Taking? Authorizing Provider  acetaminophen (TYLENOL) 325 MG tablet Take 2 tablets (650 mg total) by mouth every 6 (six) hours as needed for mild pain, moderate pain or fever. 06/19/16   Waynetta Pean, PA-C  apixaban (ELIQUIS) 5 MG TABS tablet Take 1 tablet (5 mg total) by mouth 2 (two) times daily. 12/20/15   Richardson Dopp T, PA-C  aspirin EC 81 MG tablet Take 1 tablet (81 mg total) by mouth daily. 08/13/17   Burnell Blanks, MD  atorvastatin (LIPITOR) 40 MG tablet Take 1 tablet (40 mg total) by mouth daily. 03/08/21   Burnell Blanks, MD  Cholecalciferol (VITAMIN D-3) 1000 UNITS CAPS Take 1 capsule by mouth daily. Take one tablet by mouth once daily.    [provider]  docusate sodium (COLACE) 100 MG capsule Take 100 mg by mouth daily.    [provider]  lisinopril (ZESTRIL) 2.5 MG tablet TAKE 1 TABLET BY MOUTH EVERY DAY 02/18/21   Burnell Blanks, MD  Multiple Vitamin (MULTIVITAMIN) tablet Take 1 tablet by mouth daily.    [provider]  nitroGLYCERIN (NITROSTAT) 0.4 MG SL tablet Place 1 tablet (0.4 mg total) under the tongue every 5 (five) minutes as needed for chest pain. 09/19/19   Burnell Blanks, MD    Allergies    Patient has no known allergies.  Review of Systems   Review of Systems  All other systems reviewed and are  negative.  Physical Exam Updated Vital Signs BP (!) 144/76 (BP Location: Right Arm)    Pulse (!) 43    Temp 98.9 F (37.2 C) (Oral)    Resp 17    Ht 5\' 11"  (1.803 m)    Wt 83 kg    SpO2 100%    BMI 25.52 kg/m   Physical Exam Vitals and nursing note reviewed.  Constitutional:      General: He is not in acute distress.    Appearance: Normal appearance. He is well-developed.  HENT:     Head: Normocephalic and atraumatic.  Eyes:     Conjunctiva/sclera:  Conjunctivae normal.     Pupils: Pupils are equal, round, and reactive to light.  Cardiovascular:     Rate and Rhythm: Normal rate and regular rhythm.     Heart sounds: Normal heart sounds.  Pulmonary:     Effort: Pulmonary effort is normal. No respiratory distress.     Breath sounds: Normal breath sounds.  Abdominal:     General: There is no distension.     Palpations: Abdomen is soft.     Tenderness: There is no abdominal tenderness.  Musculoskeletal:        General: No deformity. Normal range of motion.     Cervical back: Normal range of motion and neck supple.  Skin:    General: Skin is warm and dry.  Neurological:     General: No focal deficit present.     Mental Status: He is alert and oriented to person, place, and time.    ED Results / Procedures / Treatments   Labs (all labs ordered are listed, but only abnormal results are displayed) Labs Reviewed  RESP PANEL BY RT-PCR (FLU A&B, COVID) ARPGX2 - Abnormal; Notable for the following components:      Result Value   SARS Coronavirus 2 by RT PCR POSITIVE (*)    All other components within normal limits  BASIC METABOLIC PANEL - Abnormal; Notable for the following components:   Glucose, Bld 141 (*)    BUN 25 (*)    Creatinine, Ser 1.32 (*)    Calcium 8.7 (*)    GFR, Estimated 51 (*)    All other components within normal limits  CULTURE, BLOOD (ROUTINE X 2)  CULTURE, BLOOD (ROUTINE X 2)  CBC  URINALYSIS, ROUTINE W REFLEX MICROSCOPIC  LACTIC ACID, PLASMA  LACTIC  ACID, PLASMA    EKG EKG Interpretation  Date/Time:  Wednesday May 15 2021 07:45:54 EST Ventricular Rate:  84 PR Interval:  194 QRS Duration: 97 QT Interval:  355 QTC Calculation: 420 R Axis:   73 Text Interpretation: Sinus tachycardia Multiple premature complexes, vent & supraven Low voltage with right axis deviation Anteroseptal infarct, old Confirmed by Dene Gentry [phone]) on 05/15/2021 8:11:32 AM  Radiology DG Chest Port 1 View  Result Date: 05/15/2021 CLINICAL DATA:  Dyspnea.  Weakness.  COVID positive. EXAM: PORTABLE CHEST 1 VIEW COMPARISON:  06/19/2016 FINDINGS: Single AP portable view of the chest with overlying wires and leads. Midline trachea. Mild cardiomegaly. Atherosclerosis in the transverse aorta. No pleural effusion or pneumothorax. No congestive failure. Mild left greater than right base airspace disease medially. IMPRESSION: Mild bibasilar airspace disease, greater left than right. Favor atelectasis. Especially at the left lung base, pneumonia cannot be excluded. Consider PA and lateral radiographs if feasible. Cardiomegaly without congestive failure. Aortic Atherosclerosis (ICD10-I70.0). Electronically Signed   By: Abigail Miyamoto M.D.   On: 05/15/2021 08:17    Procedures Procedures   Medications Ordered in ED Medications - No data to display  ED Course  I have reviewed the triage vital signs and the nursing notes.  Pertinent labs & imaging results that were available during my care of the patient were reviewed by me and considered in my medical decision making (see chart for details).    MDM Rules/Calculators/A&P                           MDM  MSE complete  Henry Mapel. was evaluated in Emergency Department on 05/15/2021 for the symptoms described  in the history of present illness. He was evaluated in the context of the global COVID-19 pandemic, which necessitated consideration that the patient might be at risk for infection with the SARS-CoV-2  virus that causes COVID-19. Institutional protocols and algorithms that pertain to the evaluation of patients at risk for COVID-19 are in a state of rapid change based on information released by regulatory bodies including the CDC and federal and state organizations. These policies and algorithms were followed during the patient's care in the ED.  Patient is presenting with his son for evaluation of generalized weakness.  Patient at baseline is able to ambulate with a walker.  He lives independently with his wife.  The son is reporting that over the last 24 hours the patient has become significantly weaker.  He is unable to ambulate with his walker.  He had a fall prior to arrival after attempting to ambulate.  He was unable to stand after the fall.  Exam is not demonstrative of significant traumatic injury after the fall.  Obtained imaging is without evidence of traumatic injury.  COVID testing is positive today.  Patient with likely acute infection.  Patient is not hypoxic.  Chest x-ray is suggestive of early bibasilar infiltrates, likely secondary to COVID.  Patient with mild cough present for the last 1 to 2 days per report.  Clinically patient appears to be mildly dehydrated with dry mucous membranes.  Creatinine today is 1.3 with a BUN of 25.  Patient would benefit from admission for further work-up and treatment.  Hospitalist service is aware of case and will evaluate for same.  Final Clinical Impression(s) / ED Diagnoses Final diagnoses:  Acute cough  Weakness    Rx / DC Orders ED Discharge Orders     None        Valarie Merino, MD 05/15/21 1028

## 2021-05-15 NOTE — ED Notes (Addendum)
Dr. Tamala Julian, admitting at the bedside. Plan has been discussed with son (HPOA) at the bedside.

## 2021-05-15 NOTE — H&P (Signed)
History and Physical    Henry [place]. NLG:[id number] DOB: Mar 22, 1927 DOA: 05/14/2021  Referring MD/NP/PA: Dene Gentry, MD PCP: [name], MD  Patient coming from: Home(lives with elderly wife) via EMS  Chief Complaint: Weakness  I have personally briefly reviewed patient's old medical records in [place]   HPI: Henry [place]. is a 87 y.o. male with medical history significant of hypertension, hyperlipidemia, ischemic cardiomyopathy, CAD, PAF on Eliquis, history of PE in 2015, essential tremor, and reports of dementia who presents for weakness that started 1-2 days ago.  History is obtained from the patient with assistance of his son who is present at bedside.  Son reports that at baseline patient has been able to ambulate with use of a walker and has some dementia.  States that he always makes jokes and sometimes is hard to tell if these making a joke or serious when asking him questions.  Yesterday, patient was noted to be significantly weak was unable to get out of bed.  He had attempted to get out of bed and did fall. Patient states he hit his head, but son is not sure if this.  He denies any loss of consciousness or any significant pain is related to the fall.  At home they had checked him and he was positive for COVID.  He has received the vaccination and booster.  Notes associated symptoms of intermittently productive cough.  Son notes that they had been in the process of trying to get the help at home through the [place].  His mother is very particular and previously complained about the last company that came out,and so they had to restart the process over with the [place].  In route with EMS patient patient was noted to be febrile up to 101 F with blood pressure 162/100, respirations 22, O2 saturation maintained on room air.  He was given 1000 mg of acetaminophen prior to arrival.  ED Course: On admission to the emergency department patient was seen to be afebrile with heart  rates initially reported as low as 36(currently 62-94), blood pressure 99/52-157/77, and O2 saturation maintained on room air.  Labs significant for WBC 7.3, BUN 25, creatinine 1.32,  calcium 8.7, and lactic acid 1.2.  Chest x-ray significant for mild bibasilar airspace disease greater on the left than the right favoring atelectasis, but left lower lung pneumonia cannot be excluded.  CT scan of the head and cervical spine negative for any acute abnormalities, but did note prior right frontal and thalamic infarct. Urinalysis was positive for ketones, small hemoglobin, and rare bacteria but no significant signs of infection appreciated. COVID-19 screening was positive.  Patient was given 1 L normal saline IV fluids.  TRH called to admit.   Review of Systems  Unable to perform ROS: Dementia  Respiratory:  Positive for cough and sputum production. Negative for shortness of breath.   Musculoskeletal:  Positive for falls.  Neurological:  Negative for loss of consciousness.  Psychiatric/Behavioral:  Positive for memory loss.    Past Medical History:  Diagnosis Date   CAD (coronary artery disease)    a. STEMI 6/17: LHC - oLAD 10, mLAD 50, dLAD 20, D2 inf subbranch 70 + sup subbranch 100, pRCA 20, oRPDA 40, EF 35-45% >> PCI:  2.25 x 12 mm resolute integrity DES to the superior subbranch of D2    Essential and other specified forms of tremor    External hemorrhoids without mention of complication  History of ST elevation myocardial infarction (STEMI)    a. Ant-Lat STEMI >> DES to D2 subbranch   History of stroke    History of TIA (transient ischemic attack)    HTN (hypertension)    Hyperlipidemia    Ischemic cardiomyopathy    a. Echo 6/17: Mid and distal anterior/inferior, septal and apical HK, mild concentric LVH, EF 30-35%, mild LAE  //  b. Echo 9/17 mild focal basal septal hypertrophy, EF 55-60%, normal wall motion, grade 1 diastolic dysfunction, mildly dilated aortic root (37 mm), normal RVSF,  PASP 29 mmHg   Lumbago    Malignant neoplasm of prostate (HCC)    Nontraumatic rupture of tendons of biceps (long head)    Paroxysmal atrial fibrillation (HCC)    during admit for STEMI 6/17 >> triple Rx high risk >> ASA + Plavix x 30 days post MI, then Eliquis + Plavix   Sebaceous cyst    Supraventricular premature beats    Unspecified anomaly of tooth position     Past Surgical History:  Procedure Laterality Date   APPENDECTOMY  1942   BACK SURGERY     with sciatica   CARDIAC CATHETERIZATION N/A 11/19/2015   Procedure: Left Heart Cath and Coronary Angiography;  Surgeon: Burnell Blanks, MD;  Location: [facility];  Service: Cardiovascular;  Laterality: N/A;   CARDIAC CATHETERIZATION N/A 11/19/2015   Procedure: Coronary Stent Intervention;  Surgeon: Burnell Blanks, MD;  Location: [facility];  Service: Cardiovascular;  Laterality: N/A;   prostate cancer     SPINE SURGERY N/A 2000   [name]     reports that he has never smoked. He has never used smokeless tobacco. He reports that he does not drink alcohol and does not use drugs.  No Known Allergies  Family History  Problem Relation Age of Onset   Cancer Father 33       brain tumor    Prior to Admission medications   Medication Sig Start Date End Date Taking? Authorizing Provider  acetaminophen (TYLENOL) 325 MG tablet Take 2 tablets (650 mg total) by mouth every 6 (six) hours as needed for mild pain, moderate pain or fever. 06/19/16   Waynetta Pean, PA-C  apixaban (ELIQUIS) 5 MG TABS tablet Take 1 tablet (5 mg total) by mouth 2 (two) times daily. 12/20/15   Richardson Dopp T, PA-C  aspirin EC 81 MG tablet Take 1 tablet (81 mg total) by mouth daily. 08/13/17   Burnell Blanks, MD  atorvastatin (LIPITOR) 40 MG tablet Take 1 tablet (40 mg total) by mouth daily. 03/08/21   Burnell Blanks, MD  Cholecalciferol (VITAMIN D-3) 1000 UNITS CAPS Take 1 capsule by mouth daily. Take one tablet by mouth  once daily.    [provider]  docusate sodium (COLACE) 100 MG capsule Take 100 mg by mouth daily.    [provider]  lisinopril (ZESTRIL) 2.5 MG tablet TAKE 1 TABLET BY MOUTH EVERY DAY 02/18/21   Burnell Blanks, MD  Multiple Vitamin (MULTIVITAMIN) tablet Take 1 tablet by mouth daily.    [provider]  nitroGLYCERIN (NITROSTAT) 0.4 MG SL tablet Place 1 tablet (0.4 mg total) under the tongue every 5 (five) minutes as needed for chest pain. 09/19/19   Burnell Blanks, MD    Physical Exam:  Constitutional: Elderly male who appears to be no acute distress Vitals:   05/15/21 0344 05/15/21 0609 05/15/21 0741 05/15/21 0930  BP: (!) 135/93 (!) 144/76  134/69  Pulse: 89 (!) 43  65  Resp: 17 17  (!) 23  Temp: 98.2 F (36.8 C) 98.5 F (36.9 C) 98.9 F (37.2 C)   TempSrc: Oral Oral Oral   SpO2: (!) 82% 100%  95%  Weight:      Height:       Eyes: PERRL, lids and conjunctivae normal ENMT: Mucous membranes are dry. Posterior pharynx clear of any exudate or lesions.  Hard of hearing. Neck: normal, supple, no masses, no thyromegaly Respiratory: Normal respiratory effort without significant wheezes or rhonchi appreciated.  O2 saturation currently maintained on room air. Cardiovascular: Irregular, no murmurs / rubs / gallops. No extremity edema. 2+ pedal pulses. No carotid bruits.  Abdomen: no tenderness, no masses palpated. No hepatosplenomegaly. Bowel sounds positive.  Musculoskeletal: no clubbing / cyanosis.  No joint deformity appreciated. Skin: no rashes, lesions, ulcers. No induration Neurologic: CN 2-12 grossly intact.  Strength 4+/5.  Tremor present. Psychiatric: Patient alert and oriented at least to self.  Makes jokes when asked questions about orientation.    Labs on Admission: I have personally reviewed following labs and imaging studies  CBC: Recent Labs  Lab 05/14/21 2016  WBC 7.3  HGB 16.0  HCT 48.1  MCV 94.3  PLT 762   Basic  Metabolic Panel: Recent Labs  Lab 05/14/21 2016  NA 135  K 4.1  CL 102  CO2 25  GLUCOSE 141*  BUN 25*  CREATININE 1.32*  CALCIUM 8.7*   GFR: Estimated Creatinine Clearance: 38 mL/min (A) (by C-G formula based on SCr of 1.32 mg/dL (H)). Liver Function Tests: No results for input(s): AST, ALT, ALKPHOS, BILITOT, PROT, ALBUMIN in the last 168 hours. No results for input(s): LIPASE, AMYLASE in the last 168 hours. No results for input(s): AMMONIA in the last 168 hours. Coagulation Profile: No results for input(s): INR, PROTIME in the last 168 hours. Cardiac Enzymes: No results for input(s): CKTOTAL, CKMB, CKMBINDEX, TROPONINI in the last 168 hours. BNP (last 3 results) No results for input(s): PROBNP in the last 8760 hours. HbA1C: No results for input(s): HGBA1C in the last 72 hours. CBG: No results for input(s): GLUCAP in the last 168 hours. Lipid Profile: No results for input(s): CHOL, HDL, LDLCALC, TRIG, CHOLHDL, LDLDIRECT in the last 72 hours. Thyroid Function Tests: No results for input(s): TSH, T4TOTAL, FREET4, T3FREE, THYROIDAB in the last 72 hours. Anemia Panel: No results for input(s): VITAMINB12, FOLATE, FERRITIN, TIBC, IRON, RETICCTPCT in the last 72 hours. Urine analysis: No results found for: COLORURINE, APPEARANCEUR, LABSPEC, PHURINE, GLUCOSEU, HGBUR, BILIRUBINUR, KETONESUR, PROTEINUR, UROBILINOGEN, NITRITE, LEUKOCYTESUR Sepsis Labs: Recent Results (from the past 240 hour(s))  Resp Panel by RT-PCR (Flu A&B, Covid) Nasopharyngeal Swab     Status: Abnormal   Collection Time: 05/14/21  8:08 PM   Specimen: Nasopharyngeal Swab; Nasopharyngeal(NP) swabs in vial transport medium  Result Value Ref Range Status   SARS Coronavirus 2 by RT PCR POSITIVE (A) NEGATIVE Final    Comment: (NOTE) SARS-CoV-2 target nucleic acids are DETECTED.  The SARS-CoV-2 RNA is generally detectable in upper respiratory specimens during the acute phase of infection. Positive results  are indicative of the presence of the identified virus, but do not rule out bacterial infection or co-infection with other pathogens not detected by the test. Clinical correlation with patient history and other diagnostic information is necessary to determine patient infection status. The expected result is Negative.  Fact Sheet for Patients: EntrepreneurPulse.com.au  Fact Sheet for Healthcare Providers: IncredibleEmployment.be  This  test is not yet approved or cleared by the [place] and  has been authorized for detection and/or diagnosis of SARS-CoV-2 by FDA under an Emergency Use Authorization (EUA).  This EUA will remain in effect (meaning this test can be used) for the duration of  the COVID-19 declaration under Section 564(b)(1) of the A ct, 21 U.S.C. section 360bbb-3(b)(1), unless the authorization is terminated or revoked sooner.     Influenza A by PCR NEGATIVE NEGATIVE Final   Influenza B by PCR NEGATIVE NEGATIVE Final    Comment: (NOTE) The Xpert Xpress SARS-CoV-2/FLU/RSV plus assay is intended as an aid in the diagnosis of influenza from Nasopharyngeal swab specimens and should not be used as a sole basis for treatment. Nasal washings and aspirates are unacceptable for Xpert Xpress SARS-CoV-2/FLU/RSV testing.  Fact Sheet for Patients: EntrepreneurPulse.com.au  Fact Sheet for Healthcare Providers: IncredibleEmployment.be  This test is not yet approved or cleared by the [place] FDA and has been authorized for detection and/or diagnosis of SARS-CoV-2 by FDA under an Emergency Use Authorization (EUA). This EUA will remain in effect (meaning this test can be used) for the duration of the COVID-19 declaration under Section 564(b)(1) of the Act, 21 U.S.C. section 360bbb-3(b)(1), unless the authorization is terminated or revoked.  Performed at [facility], [place] [address],  [place], [place] [address]      Radiological Exams on Admission: CT Head Wo Contrast  Result Date: 05/15/2021 CLINICAL DATA:  Head trauma, fall, generalized weakness for few days, tested positive for COVID-19, moderate to severe head trauma EXAM: CT HEAD WITHOUT CONTRAST CT CERVICAL SPINE WITHOUT CONTRAST TECHNIQUE: Multidetector CT imaging of the head and cervical spine was performed following the standard protocol without intravenous contrast. Multiplanar CT image reconstructions of the cervical spine were also generated. COMPARISON:  05/01/2004 FINDINGS: CT HEAD FINDINGS Brain: Motion artifacts, for which repeat imaging was performed. Generalized atrophy. Ex vacuo dilatation of the ventricular system unchanged. No midline shift or mass effect. Small vessel chronic ischemic changes of deep cerebral white matter. Tiny old lacunar infarct RIGHT thalamus unchanged. Old RIGHT frontal infarct with associated dystrophic calcifications, question related to prior infarction or old underlying vascular malformation. Appearance is unchanged from the previous exam. No intracranial hemorrhage, mass lesion, or evidence of acute infarction. No extra-axial fluid collections. Vascular: Atherosclerotic calcification of internal carotid and vertebral arteries at skull base Skull: Intact Sinuses/Orbits: Clear Other: N/A CT CERVICAL SPINE FINDINGS Alignment: Minimal retrolisthesis at C5-C6. Remaining alignments normal. Skull base and vertebrae: Osseous demineralization. Skull base intact. Vertebral body heights maintained. Multilevel degenerative disc disease changes with disc space narrowing and endplate spur formation at C4-C7. Encroachment upon BILATERAL cervical neural foramina at multiple levels by uncovertebral spurs. Severe multilevel facet degenerative changes. No fracture, additional subluxation, or bone destruction. Extensive degenerative changes between the odontoid process and anterior arch of C1. Soft tissues and  spinal canal: Prevertebral soft tissues normal thickness. Extensive atherosclerotic calcifications LEFT greater than RIGHT carotid bifurcations. Tiny BILATERAL thyroid nodules up to 1.1 cm diameter; Not clinically significant; no follow-up imaging recommended (ref: J Am Coll Radiol. 2015 Feb;12(2): 143-50). Disc levels: Scattered bulging discs and AP narrowing of cervical spinal canal, primarily by endplate spurring. Upper chest: LEFT apical scarring. Other: N/A IMPRESSION: Atrophy with small vessel chronic ischemic changes of deep cerebral white matter. 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 No acute intracranial abnormalities. Multilevel degenerative disc and facet disease changes of the cervical spine as above. No acute cervical  spine abnormalities. Electronically Signed   By: Lavonia Dana M.D.   On: 05/15/2021 09:19   CT Cervical Spine Wo Contrast  Result Date: 05/15/2021 CLINICAL DATA:  Head trauma, fall, generalized weakness for few days, tested positive for COVID-19, moderate to severe head trauma EXAM: CT HEAD WITHOUT CONTRAST CT CERVICAL SPINE WITHOUT CONTRAST TECHNIQUE: Multidetector CT imaging of the head and cervical spine was performed following the standard protocol without intravenous contrast. Multiplanar CT image reconstructions of the cervical spine were also generated. COMPARISON:  05/01/2004 FINDINGS: CT HEAD FINDINGS Brain: Motion artifacts, for which repeat imaging was performed. Generalized atrophy. Ex vacuo dilatation of the ventricular system unchanged. No midline shift or mass effect. Small vessel chronic ischemic changes of deep cerebral white matter. Tiny old lacunar infarct RIGHT thalamus unchanged. Old RIGHT frontal infarct with associated dystrophic calcifications, question related to prior infarction or old underlying vascular malformation. Appearance is unchanged from the previous exam. No intracranial hemorrhage, mass lesion, or evidence of acute infarction. No  extra-axial fluid collections. Vascular: Atherosclerotic calcification of internal carotid and vertebral arteries at skull base Skull: Intact Sinuses/Orbits: Clear Other: N/A CT CERVICAL SPINE FINDINGS Alignment: Minimal retrolisthesis at C5-C6. Remaining alignments normal. Skull base and vertebrae: Osseous demineralization. Skull base intact. Vertebral body heights maintained. Multilevel degenerative disc disease changes with disc space narrowing and endplate spur formation at C4-C7. Encroachment upon BILATERAL cervical neural foramina at multiple levels by uncovertebral spurs. Severe multilevel facet degenerative changes. No fracture, additional subluxation, or bone destruction. Extensive degenerative changes between the odontoid process and anterior arch of C1. Soft tissues and spinal canal: Prevertebral soft tissues normal thickness. Extensive atherosclerotic calcifications LEFT greater than RIGHT carotid bifurcations. Tiny BILATERAL thyroid nodules up to 1.1 cm diameter; Not clinically significant; no follow-up imaging recommended (ref: J Am Coll Radiol. 2015 Feb;12(2): 143-50). Disc levels: Scattered bulging discs and AP narrowing of cervical spinal canal, primarily by endplate spurring. Upper chest: LEFT apical scarring. Other: N/A IMPRESSION: Atrophy with small vessel chronic ischemic changes of deep cerebral white matter. 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 No acute intracranial abnormalities. Multilevel degenerative disc and facet disease changes of the cervical spine as above. No acute cervical spine abnormalities. Electronically Signed   By: Lavonia Dana M.D.   On: 05/15/2021 09:19   DG Chest Port 1 View  Result Date: 05/15/2021 CLINICAL DATA:  Dyspnea.  Weakness.  COVID positive. EXAM: PORTABLE CHEST 1 VIEW COMPARISON:  06/19/2016 FINDINGS: Single AP portable view of the chest with overlying wires and leads. Midline trachea. Mild cardiomegaly. Atherosclerosis in the transverse aorta. No  pleural effusion or pneumothorax. No congestive failure. Mild left greater than right base airspace disease medially. IMPRESSION: Mild bibasilar airspace disease, greater left than right. Favor atelectasis. Especially at the left lung base, pneumonia cannot be excluded. Consider PA and lateral radiographs if feasible. Cardiomegaly without congestive failure. Aortic Atherosclerosis (ICD10-I70.0). Electronically Signed   By: Abigail Miyamoto M.D.   On: 05/15/2021 08:17    EKG: Independently reviewed. 84 bpm with PVCs  Assessment/Plan Fall secondary to weakness  COVID-19 infection: Acute.  Patient presents after having a fall at home secondary to weakness.  CT of the head and neck negative for any acute abnormality.  Found to be COVID-19 positive.  Noted to be febrile up to 101 F prior to arrival.  O2 saturation currently maintained on room air.  Chest x-ray concerning for mild bibasilar airspace disease concerning for atelectasis versus pneumonia.   -Admit to a medical telemetry  bed -COVID 19 order set utilized -Check inflammatory markers and monitor daily -Incentive spirometry -Remdesivir per pharmacy  -Albuterol inhaler as needed -Steroids not indicated at this time as patient not noted to be hypoxic -Vitamin C and zinc -Antitussives as needed -PT/OT to evaluate and treat -Tylenol as needed for fever  Fever: Acute.  Patient noted to have fever up to 101 F. Suspect fever secondary to COVID-19, but question possibility of bacterial infection -Follow-up procalcitonin -Follow-up blood cultures  Renal insufficiency: Patient presents with creatinine 1.32 with BUN 25.  Last available creatinine 1.06 from 10/05/2020.  This is not greater than a 0.3 increase from previous baseline is of just acute kidney injury at this time.  Patient was given 1 L normal saline IV fluids IV. -Check CK given recent fall a lot possibility of rhabdomyolysis with elevation in liver function study -Continue to monitor kidney  function daily  Paroxysmal atrial fibrillation on chronic anticoagulation: Patient is currently irregular heart rhythm on anticoagulation with Eliquis.  He is not on any rate controlling medications.  Patient has been known to falls previously in the past.  May need to reevaluate risks and benefits of continuing Eliquis. -Continue Eliquis  Hypertensive urgency: Blood pressures initially elevated up to 172/115.  Home blood pressure medications since appear to include lisinopril 2.5 mg daily. -Continue lisinopril -Hydralazine IV as needed for elevated blood pressure  History of pulmonary embolus: Patient with prior history of PE in  2015. -Continue Eliquis  Hyperlipidemia: Home medications appear to include atorvastatin 40 mg daily. -Held statin due to initial elevated liver enzymes.  Transaminitis: Acute.  AST 149 and ALT 48.  Liver function studies previously within normal limits.  No reported history of hepatitis -Continue to monitor  Memory difficulties: Patient's son reports that his father likely has some dementia and we will often make jokes for which is hard to tell. -Delirium precaution -Set bed alarm on  Spinal stenosis: At baseline patient walks with the use of a walker has history of severe spinal stenosis previously documented by MRI.  DVT prophylaxis: Eliquis Code Status: Full.  Patient's son notes that he is the POA if anything were to happen Family Communication: Son updated at bedside Disposition Plan: To be determined Consults called: None Admission status: Observation,  Norval Morton MD [facility]   If 7PM-7AM, please contact night-coverage   05/15/2021, 10:24 AM

## 2021-05-16 DIAGNOSIS — R7401 Elevation of levels of liver transaminase levels: Secondary | ICD-10-CM | POA: Diagnosis present

## 2021-05-16 DIAGNOSIS — I252 Old myocardial infarction: Secondary | ICD-10-CM | POA: Diagnosis not present

## 2021-05-16 DIAGNOSIS — Z7901 Long term (current) use of anticoagulants: Secondary | ICD-10-CM | POA: Diagnosis not present

## 2021-05-16 DIAGNOSIS — R531 Weakness: Secondary | ICD-10-CM | POA: Diagnosis present

## 2021-05-16 DIAGNOSIS — N179 Acute kidney failure, unspecified: Secondary | ICD-10-CM | POA: Diagnosis present

## 2021-05-16 DIAGNOSIS — R413 Other amnesia: Secondary | ICD-10-CM | POA: Diagnosis not present

## 2021-05-16 DIAGNOSIS — I213 ST elevation (STEMI) myocardial infarction of unspecified site: Secondary | ICD-10-CM | POA: Diagnosis not present

## 2021-05-16 DIAGNOSIS — J309 Allergic rhinitis, unspecified: Secondary | ICD-10-CM | POA: Diagnosis not present

## 2021-05-16 DIAGNOSIS — Z66 Do not resuscitate: Secondary | ICD-10-CM | POA: Diagnosis present

## 2021-05-16 DIAGNOSIS — I480 Paroxysmal atrial fibrillation: Secondary | ICD-10-CM | POA: Diagnosis present

## 2021-05-16 DIAGNOSIS — E860 Dehydration: Secondary | ICD-10-CM | POA: Diagnosis present

## 2021-05-16 DIAGNOSIS — F0390 Unspecified dementia without behavioral disturbance: Secondary | ICD-10-CM | POA: Diagnosis not present

## 2021-05-16 DIAGNOSIS — M4800 Spinal stenosis, site unspecified: Secondary | ICD-10-CM | POA: Diagnosis present

## 2021-05-16 DIAGNOSIS — Z8546 Personal history of malignant neoplasm of prostate: Secondary | ICD-10-CM | POA: Diagnosis not present

## 2021-05-16 DIAGNOSIS — J1282 Pneumonia due to coronavirus disease 2019: Secondary | ICD-10-CM | POA: Diagnosis present

## 2021-05-16 DIAGNOSIS — F05 Delirium due to known physiological condition: Secondary | ICD-10-CM | POA: Diagnosis not present

## 2021-05-16 DIAGNOSIS — Z7982 Long term (current) use of aspirin: Secondary | ICD-10-CM | POA: Diagnosis not present

## 2021-05-16 DIAGNOSIS — I2510 Atherosclerotic heart disease of native coronary artery without angina pectoris: Secondary | ICD-10-CM | POA: Diagnosis present

## 2021-05-16 DIAGNOSIS — M6282 Rhabdomyolysis: Secondary | ICD-10-CM | POA: Diagnosis present

## 2021-05-16 DIAGNOSIS — I255 Ischemic cardiomyopathy: Secondary | ICD-10-CM | POA: Diagnosis present

## 2021-05-16 DIAGNOSIS — Z743 Need for continuous supervision: Secondary | ICD-10-CM | POA: Diagnosis not present

## 2021-05-16 DIAGNOSIS — E871 Hypo-osmolality and hyponatremia: Secondary | ICD-10-CM | POA: Diagnosis not present

## 2021-05-16 DIAGNOSIS — I160 Hypertensive urgency: Secondary | ICD-10-CM | POA: Diagnosis present

## 2021-05-16 DIAGNOSIS — U071 COVID-19: Secondary | ICD-10-CM | POA: Diagnosis not present

## 2021-05-16 DIAGNOSIS — N289 Disorder of kidney and ureter, unspecified: Secondary | ICD-10-CM | POA: Diagnosis not present

## 2021-05-16 DIAGNOSIS — R410 Disorientation, unspecified: Secondary | ICD-10-CM | POA: Diagnosis not present

## 2021-05-16 DIAGNOSIS — Z86711 Personal history of pulmonary embolism: Secondary | ICD-10-CM | POA: Diagnosis not present

## 2021-05-16 DIAGNOSIS — E785 Hyperlipidemia, unspecified: Secondary | ICD-10-CM | POA: Diagnosis present

## 2021-05-16 DIAGNOSIS — Z8673 Personal history of transient ischemic attack (TIA), and cerebral infarction without residual deficits: Secondary | ICD-10-CM | POA: Diagnosis not present

## 2021-05-16 DIAGNOSIS — I10 Essential (primary) hypertension: Secondary | ICD-10-CM | POA: Diagnosis present

## 2021-05-16 DIAGNOSIS — Z79899 Other long term (current) drug therapy: Secondary | ICD-10-CM | POA: Diagnosis not present

## 2021-05-16 DIAGNOSIS — R627 Adult failure to thrive: Secondary | ICD-10-CM | POA: Diagnosis present

## 2021-05-16 DIAGNOSIS — I4891 Unspecified atrial fibrillation: Secondary | ICD-10-CM | POA: Diagnosis not present

## 2021-05-16 LAB — CBC WITH DIFFERENTIAL/PLATELET
Abs Immature Granulocytes: 0.02 10*3/uL (ref 0.00–0.07)
Basophils Absolute: 0 10*3/uL (ref 0.0–0.1)
Basophils Relative: 0 %
Eosinophils Absolute: 0 10*3/uL (ref 0.0–0.5)
Eosinophils Relative: 0 %
HCT: 48.3 % (ref 39.0–52.0)
Hemoglobin: 15.8 g/dL (ref 13.0–17.0)
Immature Granulocytes: 0 %
Lymphocytes Relative: 8 %
Lymphs Abs: 0.8 10*3/uL (ref 0.7–4.0)
MCH: 30.9 pg (ref 26.0–34.0)
MCHC: 32.7 g/dL (ref 30.0–36.0)
MCV: 94.3 fL (ref 80.0–100.0)
Monocytes Absolute: 1.2 10*3/uL — ABNORMAL HIGH (ref 0.1–1.0)
Monocytes Relative: 12 %
Neutro Abs: 7.9 10*3/uL — ABNORMAL HIGH (ref 1.7–7.7)
Neutrophils Relative %: 80 %
Platelets: 155 10*3/uL (ref 150–400)
RBC: 5.12 MIL/uL (ref 4.22–5.81)
RDW: 13.8 % (ref 11.5–15.5)
WBC: 9.9 10*3/uL (ref 4.0–10.5)
nRBC: 0 % (ref 0.0–0.2)

## 2021-05-16 LAB — PHOSPHORUS: Phosphorus: 3.9 mg/dL (ref 2.5–4.6)

## 2021-05-16 LAB — COMPREHENSIVE METABOLIC PANEL
ALT: 41 U/L (ref 0–44)
AST: 98 U/L — ABNORMAL HIGH (ref 15–41)
Albumin: 2.7 g/dL — ABNORMAL LOW (ref 3.5–5.0)
Alkaline Phosphatase: 50 U/L (ref 38–126)
Anion gap: 10 (ref 5–15)
BUN: 32 mg/dL — ABNORMAL HIGH (ref 8–23)
CO2: 24 mmol/L (ref 22–32)
Calcium: 8.4 mg/dL — ABNORMAL LOW (ref 8.9–10.3)
Chloride: 105 mmol/L (ref 98–111)
Creatinine, Ser: 1.35 mg/dL — ABNORMAL HIGH (ref 0.61–1.24)
GFR, Estimated: 49 mL/min — ABNORMAL LOW (ref >60)
Glucose, Bld: 120 mg/dL — ABNORMAL HIGH (ref 70–99)
Potassium: 4.3 mmol/L (ref 3.5–5.1)
Sodium: 139 mmol/L (ref 135–145)
Total Bilirubin: 0.6 mg/dL (ref 0.3–1.2)
Total Protein: 5.1 g/dL — ABNORMAL LOW (ref 6.5–8.1)

## 2021-05-16 LAB — C-REACTIVE PROTEIN: CRP: 9.4 mg/dL — ABNORMAL HIGH (ref <1.0)

## 2021-05-16 LAB — FERRITIN: Ferritin: 97 ng/mL (ref 24–336)

## 2021-05-16 LAB — MAGNESIUM: Magnesium: 2 mg/dL (ref 1.7–2.4)

## 2021-05-16 LAB — D-DIMER, QUANTITATIVE: D-Dimer, Quant: 1.44 ug/mL-FEU — ABNORMAL HIGH (ref 0.00–0.50)

## 2021-05-16 MED ORDER — METHYLPREDNISOLONE SODIUM SUCC 125 MG IJ SOLR
60.0000 mg | Freq: Every day | INTRAMUSCULAR | Status: DC
  Administered 2021-05-16 – 2021-05-18 (×3): 60 mg via INTRAVENOUS
  Filled 2021-05-16 (×4): qty 2

## 2021-05-16 MED ORDER — SODIUM CHLORIDE 0.9 % IV SOLN
12.5000 mg | Freq: Three times a day (TID) | INTRAVENOUS | Status: DC | PRN
  Administered 2021-05-17: 12.5 mg via INTRAVENOUS
  Filled 2021-05-16 (×3): qty 0.5

## 2021-05-16 NOTE — Plan of Care (Signed)
°  Problem: Clinical Measurements: Goal: Will remain free from infection Outcome: Progressing Goal: Diagnostic test results will improve Outcome: Progressing Goal: Respiratory complications will improve Outcome: Progressing   Problem: Nutrition: Goal: Adequate nutrition will be maintained Outcome: Progressing   Problem: Elimination: Goal: Will not experience complications related to bowel motility Outcome: Progressing Goal: Will not experience complications related to urinary retention Outcome: Progressing   Problem: Safety: Goal: Ability to remain free from injury will improve Outcome: Progressing

## 2021-05-16 NOTE — Progress Notes (Signed)
OT Cancellation Note  Patient Details Name: Henry [place]. MRN: [id number] DOB: March 10, 1927   Cancelled Treatment:    Reason Eval/Treat Not Completed: Patient at procedure or test/ unavailable;Other (comment) (with PT). Plan to reattempt.  Tyrone Schimke, OT Acute Rehabilitation Services Office: [phone]  05/16/2021, 11:01 AM

## 2021-05-16 NOTE — Evaluation (Signed)
Occupational Therapy Evaluation Patient Details Name: Henry Franklin. MRN: [id number] DOB: 01-Dec-1926 Today's Date: 05/16/2021   History of Present Illness 87 y.o. male presents to [facility] on 05/15/2021 after fall and difficulty ambulating at home. Pt found to be COVID+. PMH includes HTN, HLD, schemic cardiomyopathy, CAD, PAF on Eliquis, history of PE in 2015, essential tremor.   Clinical Impression   Pt admitted with the above diagnoses and presents with below problem list. Pt will benefit from continued acute OT to address the below listed deficits and maximize independence with basic ADLs prior to d/c to next venue. At baseline, pt needs assist with bathing/dressing, uses rollator. He lives with his spouse with additional family close by. Pt currently needs max A with LB ADLs, mod A to stand 2x from recliner, stood about 25 seconds before fatiguing into seat. Pt in recliner at end of session.        Recommendations for follow up therapy are one component of a multi-disciplinary discharge planning process, led by the attending physician.  Recommendations may be updated based on patient status, additional functional criteria and insurance authorization.   Follow Up Recommendations  Skilled nursing-short term rehab (<3 hours/day)    Assistance Recommended at Discharge Frequent or constant Supervision/Assistance  Functional Status Assessment  Patient has had a recent decline in their functional status and demonstrates the ability to make significant improvements in function in a reasonable and predictable amount of time.  Equipment Recommendations  Other (comment) (defer to next venue. if d/c home may benefit from w/c)    Recommendations for Other Services       Precautions / Restrictions Precautions Precautions: Fall Precaution Comments: COVID+ Restrictions Weight Bearing Restrictions: No      Mobility Bed Mobility Overal bed mobility: Needs Assistance Bed Mobility:  Rolling;Sidelying to Sit Rolling: Min guard Sidelying to sit: Min assist;HOB elevated       General bed mobility comments: up in recliner    Transfers Overall transfer level: Needs assistance Equipment used: Rolling walker (2 wheels) Transfers: Sit to/from Stand Sit to Stand: Mod assist Stand pivot transfers: Max assist         General transfer comment: 2x stood from recliner. Unsteady. Mod A. Posterior lean and feet tend to slide anteriorly.      Balance Overall balance assessment: Needs assistance Sitting-balance support: Feet supported;Single extremity supported Sitting balance-Leahy Scale: Poor Sitting balance - Comments: unilateral UE support of bed to maintain sitting balance Postural control: Posterior lean Standing balance support: Bilateral upper extremity supported Standing balance-Leahy Scale: Poor Standing balance comment: min-modA with BUE support of RW                           ADL either performed or assessed with clinical judgement   ADL Overall ADL's : Needs assistance/impaired Eating/Feeding: Set up;Sitting   Grooming: Moderate assistance;Sitting   Upper Body Bathing: Moderate assistance;Sitting   Lower Body Bathing: Maximal assistance;Sit to/from stand   Upper Body Dressing : Moderate assistance;Sitting   Lower Body Dressing: Maximal assistance;Sit to/from stand                 General ADL Comments: Pt stood 2x from recliner. BLE shaky and weak, posterior lean. stood about 25 seconds     Vision         Perception     Praxis      Pertinent Vitals/Pain Pain Assessment: No/denies pain     Hand  Dominance Right   Extremity/Trunk Assessment Upper Extremity Assessment Upper Extremity Assessment: Generalized weakness   Lower Extremity Assessment Lower Extremity Assessment: Defer to PT evaluation;Generalized weakness   Cervical / Trunk Assessment Cervical / Trunk Assessment: Kyphotic   Communication  Communication Communication: HOH   Cognition Arousal/Alertness: Awake/alert Behavior During Therapy: WFL for tasks assessed/performed Overall Cognitive Status: No family/caregiver present to determine baseline cognitive functioning Area of Impairment: Orientation;Attention;Memory;Following commands;Safety/judgement;Awareness;Problem solving                 Orientation Level: Disoriented to;Time Current Attention Level: Sustained Memory: Decreased short-term memory Following Commands: Follows one step commands consistently;Follows multi-step commands inconsistently Safety/Judgement: Decreased awareness of deficits Awareness: Intellectual Problem Solving: Slow processing;Decreased initiation;Requires verbal cues;Requires tactile cues;Difficulty sequencing       General Comments  VSS on RA    Exercises     Shoulder Instructions      Home Living Family/patient expects to be discharged to:: Private residence Living Arrangements: Spouse/significant other;Other (Comment) (son lives across the street) Available Help at Discharge: Family;Available 24 hours/day Type of Home: House Home Access: Stairs to enter [employer] of Steps: 7 Entrance Stairs-Rails: Right Home Layout: One level     Bathroom Shower/Tub: Chief Strategy Officer: Rollator (4 wheels);Shower seat          Prior Functioning/Environment Prior Level of Function : Needs assist             Mobility Comments: pt ambulates household distances with 4 wheeled walker ADLs Comments: requires assistance for bathing, dressing, cooking, all ADLs        OT Problem List: Decreased strength;Decreased activity tolerance;Impaired balance (sitting and/or standing);Decreased cognition;Decreased knowledge of use of DME or AE;Decreased knowledge of precautions;Cardiopulmonary status limiting activity      OT Treatment/Interventions: Self-care/ADL training;Therapeutic exercise;Energy  conservation;DME and/or AE instruction;Therapeutic activities;Patient/family education;Balance training    OT Goals(Current goals can be found in the care plan section) Acute Rehab OT Goals Patient Stated Goal: did not state OT Goal Formulation: With patient Time For Goal Achievement: 06-13-21 Potential to Achieve Goals: Good ADL Goals Pt Will Perform Lower Body Bathing: with mod assist;sit to/from stand Pt Will Perform Lower Body Dressing: with mod assist;sit to/from stand Pt Will Transfer to Toilet: ambulating;with mod assist;stand pivot transfer;with min assist Pt Will Perform Toileting - Clothing Manipulation and hygiene: with mod assist;sit to/from stand;sitting/lateral leans  OT Frequency: Min 2X/week   Barriers to D/C:            Co-evaluation              AM-PAC OT "6 Clicks" Daily Activity     Outcome Measure Help from another person eating meals?: A Little Help from another person taking care of personal grooming?: A Little Help from another person toileting, which includes using toliet, bedpan, or urinal?: A Lot Help from another person bathing (including washing, rinsing, drying)?: A Lot Help from another person to put on and taking off regular upper body clothing?: A Little Help from another person to put on and taking off regular lower body clothing?: A Lot 6 Click Score: 15   End of Session Equipment Utilized During Treatment: Rolling walker (2 wheels) Nurse Communication: Other (comment)  Activity Tolerance: Patient limited by fatigue;Patient tolerated treatment well Patient left: in chair;with call bell/phone within reach;with chair alarm set  OT Visit Diagnosis: Unsteadiness on feet (R26.81);Muscle weakness (generalized) (M62.81);Other symptoms and signs involving cognitive function  Time: 8978-4784 OT Time Calculation (min): 21 min Charges:  OT General Charges $OT Visit: 1 Visit OT Evaluation $OT Eval Moderate Complexity: [place], OT Acute Rehabilitation Services Office: [phone]   Hortencia Pilar 05/16/2021, 2:12 PM

## 2021-05-16 NOTE — Progress Notes (Signed)
Patient ID: Henry Nestle., male   DOB: Feb 23, 1927, 87 y.o.   MRN: [id number]  PROGRESS NOTE    Henry Franklin.  LGX:[id number] DOB: 11-30-1926 DOA: 05/14/2021 PCP: [name], MD   Brief Narrative:  87 y.o. male with medical history significant of hypertension, hyperlipidemia, ischemic cardiomyopathy, CAD, PAF on Eliquis, history of PE in 2015, essential tremor, and reports of dementia presented with worsening weakness and fall.  Patient was found to be COVID-positive at home.  In route with EMS, patient was febrile to 101.  On presentation to the ED, he was tachypneic with creatinine of 1.32 and lactic acid of 1.2.  Chest x-ray showed mild bibasilar airspace disease greater on the left than the right, left lower lung pneumonia could not be excluded.  CT of the head and cervical spine was negative for any acute abnormalities.  COVID-19 testing was positive.  He was started on IV remdesivir.  Assessment & Plan:   COVID-19 pneumonia Generalized weakness and fall secondary to above -Presented with worsening generalized weakness and fall.  CT of the head and neck negative for any acute abnormality. -Chest x-ray: Could not rule out left lower lobe pneumonia. -Currently on room air.  On IV remdesivir.  Start Solu-Medrol 60 mg IV daily.  Monitor daily inflammatory markers.  Continue inhalers as needed and supplemental oxygen as needed. -Follow cultures.  Doubt that patient has bacterial infection.  Procalcitonin on presentation was 0.1.  Acute kidney injury -Creatinine 1.32 on presentation; 1.06 on 10/05/2020.  Treated with IV fluids.  Creatinine pending for today.  Paroxysmal A. fib on chronic anticoagulation -Rate controlled.  Continue Eliquis.  Outpatient follow-up with cardiology  Hypertensive urgency -Blood pressure improving.  Continue lisinopril.  History of PE in 2015 -Continue Eliquis  Hyperlipidemia -Statin on hold due to elevated liver enzymes  Elevated LFTs -Questionable  cause.  Monitor LFTs.  Pending for today  Elevated CK -Possibly from fall and mild rhabdomyolysis.  Monitor  Memory difficulties -Patient's son reported that his father likely had some dementia -Delirium precautions.  Fall precautions.  PT eval. -Currently listed as full code.  Palliative care consultation for goals of care discussion  Spinal stenosis -At baseline, patient walks with the use of walker and has a history of severe spinal stenosis previously documented by MRI. -PT eval  DVT prophylaxis: Eliquis Code Status: Full Family Communication: Son at bedside Disposition Plan: Status is: Observation  The patient will require care spanning > 2 midnights and should be moved to inpatient because: Of need for IV Solu-Medrol and remdesivir; PT eval   Consultants: Consult palliative care  Procedures: None  Antimicrobials:  Anti-infectives (From admission, onward)    Start     Dose/Rate Route Frequency Ordered Stop   05/16/21 1000  remdesivir 100 mg in sodium chloride 0.9 % 100 mL IVPB       See Hyperspace for full Linked Orders Report.   100 mg 200 mL/hr over 30 Minutes Intravenous Daily 05/15/21 1035 05/20/21 0959   05/15/21 1045  remdesivir 200 mg in sodium chloride 0.9% 250 mL IVPB       See Hyperspace for full Linked Orders Report.   200 mg 580 mL/hr over 30 Minutes Intravenous Once 05/15/21 1035 05/15/21 1635        Subjective: Patient seen and examined at bedside.  Poor historian.  Still feels very weak.  Denies worsening cough.  No overnight fever, vomiting or seizures reported.  Objective: Vitals:   05/15/21  1739 05/15/21 1852 05/15/21 2216 05/16/21 0525  BP: 131/89 (!) 148/100 (!) 141/67 116/89  Pulse: 89 99 98 (!) 47  Resp: 16 19 18    Temp: 98.6 F (37 C) 98.9 F (37.2 C) 98.6 F (37 C)   TempSrc:   Oral   SpO2: 91% 90% 90% 98%  Weight:      Height:       No intake or output data in the 24 hours ending 05/16/21 Henry Franklin   05/14/21 1955   Weight: 83 kg    Examination:  General exam: Appears calm and comfortable.  Currently on room air.  Elderly male lying in bed. Respiratory system: Bilateral decreased breath sounds at bases with some scattered crackles Cardiovascular system: S1 & S2 heard, Rate controlled Gastrointestinal system: Abdomen is nondistended, soft and nontender. Normal bowel sounds heard. Extremities: No cyanosis, clubbing, edema  Central nervous system: Awake, very slow to respond.  Poor historian.  No focal neurological deficits. Moving extremities Skin: No rashes, lesions or ulcers Psychiatry: Affect is mostly flat.   Data Reviewed: I have personally reviewed following labs and imaging studies  CBC: Recent Labs  Lab 05/14/21 2016 05/16/21 0656  WBC 7.3 9.9  NEUTROABS  --  7.9*  HGB 16.0 15.8  HCT 48.1 48.3  MCV 94.3 94.3  PLT 162 700   Basic Metabolic Panel: Recent Labs  Lab 05/14/21 2016 05/15/21 2139  NA 135 136  K 4.1 4.0  CL 102 104  CO2 25 22  GLUCOSE 141* 137*  BUN 25* 27*  CREATININE 1.32* 1.25*  CALCIUM 8.7* 8.2*   GFR: Estimated Creatinine Clearance: 40.2 mL/min (A) (by C-G formula based on SCr of 1.25 mg/dL (H)). Liver Function Tests: Recent Labs  Lab 05/15/21 1259  AST 149*  ALT 48*  ALKPHOS 61  BILITOT 0.7  PROT 5.9*  ALBUMIN 3.2*   No results for input(s): LIPASE, AMYLASE in the last 168 hours. No results for input(s): AMMONIA in the last 168 hours. Coagulation Profile: No results for input(s): INR, PROTIME in the last 168 hours. Cardiac Enzymes: Recent Labs  Lab 05/15/21 2139  CKTOTAL 2,527*   BNP (last 3 results) No results for input(s): PROBNP in the last 8760 hours. HbA1C: No results for input(s): HGBA1C in the last 72 hours. CBG: No results for input(s): GLUCAP in the last 168 hours. Lipid Profile: No results for input(s): CHOL, HDL, LDLCALC, TRIG, CHOLHDL, LDLDIRECT in the last 72 hours. Thyroid Function Tests: No results for input(s): TSH,  T4TOTAL, FREET4, T3FREE, THYROIDAB in the last 72 hours. Anemia Panel: Recent Labs    05/15/21 1259  FERRITIN 91   Sepsis Labs: Recent Labs  Lab 05/15/21 0925 05/15/21 1259  PROCALCITON  --  0.10  LATICACIDVEN 1.2 1.2    Recent Results (from the past 240 hour(s))  Resp Panel by RT-PCR (Flu A&B, Covid) Nasopharyngeal Swab     Status: Abnormal   Collection Time: 05/14/21  8:08 PM   Specimen: Nasopharyngeal Swab; Nasopharyngeal(NP) swabs in vial transport medium  Result Value Ref Range Status   SARS Coronavirus 2 by RT PCR POSITIVE (A) NEGATIVE Final    Comment: (NOTE) SARS-CoV-2 target nucleic acids are DETECTED.  The SARS-CoV-2 RNA is generally detectable in upper respiratory specimens during the acute phase of infection. Positive results are indicative of the presence of the identified virus, but do not rule out bacterial infection or co-infection with other pathogens not detected by the test. Clinical correlation with patient history and  other diagnostic information is necessary to determine patient infection status. The expected result is Negative.  Fact Sheet for Patients: EntrepreneurPulse.com.au  Fact Sheet for Healthcare Providers: IncredibleEmployment.be  This test is not yet approved or cleared by the [place] FDA and  has been authorized for detection and/or diagnosis of SARS-CoV-2 by FDA under an Emergency Use Authorization (EUA).  This EUA will remain in effect (meaning this test can be used) for the duration of  the COVID-19 declaration under Section 564(b)(1) of the A ct, 21 U.S.C. section 360bbb-3(b)(1), unless the authorization is terminated or revoked sooner.     Influenza A by PCR NEGATIVE NEGATIVE Final   Influenza B by PCR NEGATIVE NEGATIVE Final    Comment: (NOTE) The Xpert Xpress SARS-CoV-2/FLU/RSV plus assay is intended as an aid in the diagnosis of influenza from Nasopharyngeal swab specimens  and should not be used as a sole basis for treatment. Nasal washings and aspirates are unacceptable for Xpert Xpress SARS-CoV-2/FLU/RSV testing.  Fact Sheet for Patients: EntrepreneurPulse.com.au  Fact Sheet for Healthcare Providers: IncredibleEmployment.be  This test is not yet approved or cleared by the [place] FDA and has been authorized for detection and/or diagnosis of SARS-CoV-2 by FDA under an Emergency Use Authorization (EUA). This EUA will remain in effect (meaning this test can be used) for the duration of the COVID-19 declaration under Section 564(b)(1) of the Act, 21 U.S.C. section 360bbb-3(b)(1), unless the authorization is terminated or revoked.  Performed at [facility], [place] [address] [place], [place] [address]   Culture, blood (routine x 2)     Status: None (Preliminary result)   Collection Time: 05/15/21  8:56 AM   Specimen: BLOOD RIGHT HAND  Result Value Ref Range Status   Specimen Description BLOOD RIGHT HAND  Final   Special Requests   Final    BOTTLES DRAWN AEROBIC ONLY Blood Culture results may not be optimal due to an inadequate volume of blood received in culture bottles   Culture   Final    NO GROWTH < 24 HOURS Performed at [facility], [place] [address], [place], [place] [address]    Report Status PENDING  Incomplete  Culture, blood (routine x 2)     Status: None (Preliminary result)   Collection Time: 05/15/21  9:07 AM   Specimen: BLOOD  Result Value Ref Range Status   Specimen Description BLOOD LEFT ANTECUBITAL  Final   Special Requests   Final    BOTTLES DRAWN AEROBIC AND ANAEROBIC Blood Culture results may not be optimal due to an inadequate volume of blood received in culture bottles   Culture   Final    NO GROWTH < 24 HOURS Performed at [facility], [place] [address] [place],  [address]    Report Status PENDING  Incomplete         Radiology Studies: CT Head Wo  Contrast  Result Date: 05/15/2021 CLINICAL DATA:  Head trauma, fall, generalized weakness for few days, tested positive for COVID-19, moderate to severe head trauma EXAM: CT HEAD WITHOUT CONTRAST CT CERVICAL SPINE WITHOUT CONTRAST TECHNIQUE: Multidetector CT imaging of the head and cervical spine was performed following the standard protocol without intravenous contrast. Multiplanar CT image reconstructions of the cervical spine were also generated. COMPARISON:  05/01/2004 FINDINGS: CT HEAD FINDINGS Brain: Motion artifacts, for which repeat imaging was performed. Generalized atrophy. Ex vacuo dilatation of the ventricular system unchanged. No midline shift or mass effect. Small vessel chronic ischemic changes  of deep cerebral white matter. Tiny old lacunar infarct RIGHT thalamus unchanged. Old RIGHT frontal infarct with associated dystrophic calcifications, question related to prior infarction or old underlying vascular malformation. Appearance is unchanged from the previous exam. No intracranial hemorrhage, mass lesion, or evidence of acute infarction. No extra-axial fluid collections. Vascular: Atherosclerotic calcification of internal carotid and vertebral arteries at skull base Skull: Intact Sinuses/Orbits: Clear Other: N/A CT CERVICAL SPINE FINDINGS Alignment: Minimal retrolisthesis at C5-C6. Remaining alignments normal. Skull base and vertebrae: Osseous demineralization. Skull base intact. Vertebral body heights maintained. Multilevel degenerative disc disease changes with disc space narrowing and endplate spur formation at C4-C7. Encroachment upon BILATERAL cervical neural foramina at multiple levels by uncovertebral spurs. Severe multilevel facet degenerative changes. No fracture, additional subluxation, or bone destruction. Extensive degenerative changes between the odontoid process and anterior arch of C1. Soft tissues and spinal canal: Prevertebral soft tissues normal thickness. Extensive  atherosclerotic calcifications LEFT greater than RIGHT carotid bifurcations. Tiny BILATERAL thyroid nodules up to 1.1 cm diameter; Not clinically significant; no follow-up imaging recommended (ref: J Am Coll Radiol. 2015 Feb;12(2): 143-50). Disc levels: Scattered bulging discs and AP narrowing of cervical spinal canal, primarily by endplate spurring. Upper chest: LEFT apical scarring. Other: N/A IMPRESSION: Atrophy with small vessel chronic ischemic changes of deep cerebral white matter. 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 No acute intracranial abnormalities. Multilevel degenerative disc and facet disease changes of the cervical spine as above. No acute cervical spine abnormalities. Electronically Signed   By: Lavonia Dana M.D.   On: 05/15/2021 09:19   CT Cervical Spine Wo Contrast  Result Date: 05/15/2021 CLINICAL DATA:  Head trauma, fall, generalized weakness for few days, tested positive for COVID-19, moderate to severe head trauma EXAM: CT HEAD WITHOUT CONTRAST CT CERVICAL SPINE WITHOUT CONTRAST TECHNIQUE: Multidetector CT imaging of the head and cervical spine was performed following the standard protocol without intravenous contrast. Multiplanar CT image reconstructions of the cervical spine were also generated. COMPARISON:  05/01/2004 FINDINGS: CT HEAD FINDINGS Brain: Motion artifacts, for which repeat imaging was performed. Generalized atrophy. Ex vacuo dilatation of the ventricular system unchanged. No midline shift or mass effect. Small vessel chronic ischemic changes of deep cerebral white matter. Tiny old lacunar infarct RIGHT thalamus unchanged. Old RIGHT frontal infarct with associated dystrophic calcifications, question related to prior infarction or old underlying vascular malformation. Appearance is unchanged from the previous exam. No intracranial hemorrhage, mass lesion, or evidence of acute infarction. No extra-axial fluid collections. Vascular: Atherosclerotic calcification of  internal carotid and vertebral arteries at skull base Skull: Intact Sinuses/Orbits: Clear Other: N/A CT CERVICAL SPINE FINDINGS Alignment: Minimal retrolisthesis at C5-C6. Remaining alignments normal. Skull base and vertebrae: Osseous demineralization. Skull base intact. Vertebral body heights maintained. Multilevel degenerative disc disease changes with disc space narrowing and endplate spur formation at C4-C7. Encroachment upon BILATERAL cervical neural foramina at multiple levels by uncovertebral spurs. Severe multilevel facet degenerative changes. No fracture, additional subluxation, or bone destruction. Extensive degenerative changes between the odontoid process and anterior arch of C1. Soft tissues and spinal canal: Prevertebral soft tissues normal thickness. Extensive atherosclerotic calcifications LEFT greater than RIGHT carotid bifurcations. Tiny BILATERAL thyroid nodules up to 1.1 cm diameter; Not clinically significant; no follow-up imaging recommended (ref: J Am Coll Radiol. 2015 Feb;12(2): 143-50). Disc levels: Scattered bulging discs and AP narrowing of cervical spinal canal, primarily by endplate spurring. Upper chest: LEFT apical scarring. Other: N/A IMPRESSION: Atrophy with small vessel chronic ischemic changes of deep cerebral white  matter. 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 No acute intracranial abnormalities. Multilevel degenerative disc and facet disease changes of the cervical spine as above. No acute cervical spine abnormalities. Electronically Signed   By: Lavonia Dana M.D.   On: 05/15/2021 09:19   DG Chest Port 1 View  Result Date: 05/15/2021 CLINICAL DATA:  Dyspnea.  Weakness.  COVID positive. EXAM: PORTABLE CHEST 1 VIEW COMPARISON:  06/19/2016 FINDINGS: Single AP portable view of the chest with overlying wires and leads. Midline trachea. Mild cardiomegaly. Atherosclerosis in the transverse aorta. No pleural effusion or pneumothorax. No congestive failure. Mild left greater  than right base airspace disease medially. IMPRESSION: Mild bibasilar airspace disease, greater left than right. Favor atelectasis. Especially at the left lung base, pneumonia cannot be excluded. Consider PA and lateral radiographs if feasible. Cardiomegaly without congestive failure. Aortic Atherosclerosis (ICD10-I70.0). Electronically Signed   By: Abigail Miyamoto M.D.   On: 05/15/2021 08:17        Scheduled Meds:  albuterol  2 puff Inhalation Q6H   apixaban  5 mg Oral BID   vitamin C  500 mg Oral Daily   lisinopril  2.5 mg Oral Daily   sodium chloride flush  3 mL Intravenous Q12H   zinc sulfate  220 mg Oral Daily   Continuous Infusions:  remdesivir 100 mg in NS 100 mL            Aline August, MD [facility] 05/16/2021, 7:38 AM

## 2021-05-16 NOTE — Evaluation (Signed)
Physical Therapy Evaluation Patient Details Name: Henry Franklin. MRN: [id number] DOB: May 15, 1927 Today's Date: 05/16/2021  History of Present Illness  87 y.o. male presents to [facility] hospital on 05/15/2021 after fall and difficulty ambulating at home. Pt found to be COVID+. PMH includes HTN, HLD, schemic cardiomyopathy, CAD, PAF on Eliquis, history of PE in 2015, essential tremor.  Clinical Impression  Pt presents to PT with deficits in functional mobility, strength, power, endurance, gait, balance. Pt with strong posterior lean with standing attempts, feet sliding anteriorly resulting in losses of balance. Pt is unable to consistently maintain center of mass over base of support, requiring significant physical assistance to prevent falls during out of bed mobility. Pt will benefit from continued aggressive mobilization to reduce falls risk. PT recommends SNF placement at this time.       Recommendations for follow up therapy are one component of a multi-disciplinary discharge planning process, led by the attending physician.  Recommendations may be updated based on patient status, additional functional criteria and insurance authorization.  Follow Up Recommendations Skilled nursing-short term rehab (<3 hours/day)    Assistance Recommended at Discharge Intermittent Supervision/Assistance  Functional Status Assessment Patient has had a recent decline in their functional status and demonstrates the ability to make significant improvements in function in a reasonable and predictable amount of time.  Equipment Recommendations  Wheelchair (measurements PT)    Recommendations for Other Services       Precautions / Restrictions Precautions Precautions: Fall Precaution Comments: COVID+ Restrictions Weight Bearing Restrictions: No      Mobility  Bed Mobility Overal bed mobility: Needs Assistance Bed Mobility: Rolling;Sidelying to Sit Rolling: Min guard Sidelying to sit: Min assist;HOB  elevated            Transfers Overall transfer level: Needs assistance Equipment used: Rolling walker (2 wheels);1 person hand held assist Transfers: Sit to/from Stand;Bed to chair/wheelchair/BSC Sit to Stand: Mod assist Stand pivot transfers: Max assist         General transfer comment: pt performs 3 sit to stands and one stand pivot transfer, strong posterior lean with feet sliding anteriorly in standing    Ambulation/Gait Ambulation/Gait assistance:  (unable to ambulate due to posterior lean and imbalance)                Stairs            Wheelchair Mobility    Modified Rankin (Stroke Patients Only)       Balance Overall balance assessment: Needs assistance Sitting-balance support: Feet supported;Single extremity supported Sitting balance-Leahy Scale: Poor Sitting balance - Comments: unilateral UE support of bed to maintain sitting balance Postural control: Posterior lean Standing balance support: Bilateral upper extremity supported Standing balance-Leahy Scale: Poor Standing balance comment: min-modA with BUE support of RW                             Pertinent Vitals/Pain Pain Assessment: No/denies pain    Home Living Family/patient expects to be discharged to:: Private residence Living Arrangements: Spouse/significant other (son lives across the street) Available Help at Discharge: Family;Available 24 hours/day Type of Home: House Home Access: Stairs to enter Entrance Stairs-Rails: Right Entrance Stairs-Number of Steps: 7   Home Layout: One level Home Equipment: Rollator (4 wheels)      Prior Function Prior Level of Function : Needs assist             Mobility Comments: pt ambulates household  distances with 4 wheeled walker ADLs Comments: requires assistance for bathing, dressing, cooking, all ADLs     Hand Dominance   Dominant Hand: Right    Extremity/Trunk Assessment   Upper Extremity Assessment Upper  Extremity Assessment: Generalized weakness    Lower Extremity Assessment Lower Extremity Assessment: Generalized weakness    Cervical / Trunk Assessment Cervical / Trunk Assessment: Kyphotic  Communication   Communication: HOH  Cognition Arousal/Alertness: Awake/alert Behavior During Therapy: WFL for tasks assessed/performed Overall Cognitive Status: Impaired/Different from baseline Area of Impairment: Orientation;Safety/judgement;Awareness;Problem solving                 Orientation Level: Disoriented to;Time       Safety/Judgement: Decreased awareness of deficits Awareness: Intellectual Problem Solving: Slow processing;Decreased initiation;Requires verbal cues;Requires tactile cues          General Comments General comments (skin integrity, edema, etc.): VSS on RA    Exercises     Assessment/Plan    PT Assessment Patient needs continued PT services  PT Problem List Decreased strength;Decreased activity tolerance;Decreased balance;Decreased mobility;Decreased knowledge of use of DME;Decreased safety awareness;Decreased knowledge of precautions       PT Treatment Interventions DME instruction;Gait training;Therapeutic activities;Functional mobility training;Stair training;Therapeutic exercise;Balance training;Neuromuscular re-education;Patient/family education    PT Goals (Current goals can be found in the Care Plan section)  Acute Rehab PT Goals Patient Stated Goal: to improve balance and reduce falls risk PT Goal Formulation: With patient Time For Goal Achievement: 06/22/21 Potential to Achieve Goals: Fair    Frequency Min 2X/week   Barriers to discharge        Co-evaluation               AM-PAC PT "6 Clicks" Mobility  Outcome Measure Help needed turning from your back to your side while in a flat bed without using bedrails?: A Little Help needed moving from lying on your back to sitting on the side of a flat bed without using bedrails?: A  Little Help needed moving to and from a bed to a chair (including a wheelchair)?: A Lot Help needed standing up from a chair using your arms (e.g., wheelchair or bedside chair)?: A Lot Help needed to walk in hospital room?: Total Help needed climbing 3-5 steps with a railing? : Total 6 Click Score: 12    End of Session   Activity Tolerance: Patient tolerated treatment well Patient left: in chair;with call bell/phone within reach;with chair alarm set Nurse Communication: Mobility status;Need for lift equipment (use of STEDY and +2 assist) PT Visit Diagnosis: Other abnormalities of gait and mobility (R26.89);Muscle weakness (generalized) (M62.81);History of falling (Z91.81)    Time: 0932-3557 PT Time Calculation (min) (ACUTE ONLY): 29 min   Charges:   PT Evaluation $PT Eval Low Complexity: 1 Low PT Treatments $Therapeutic Activity: 8-22 mins        Zenaida Niece, PT, DPT Acute Rehabilitation Pager: [phone] Office [phone]   Zenaida Niece 05/16/2021, 12:14 PM

## 2021-05-16 NOTE — TOC Initial Note (Signed)
Transition of Care (TOC) - Initial/Assessment Note    Patient Details  Name: Henry Franklin. MRN: [id number] Date of Birth: [birth year]  Transition of Care [facility]) CM/SW Contact:    Milinda Antis, [place] Phone Number: 05/16/2021, 1:17 PM  Clinical Narrative:                 CSW received consult for possible SNF placement at time of discharge. CSW spoke with patient. Patient reported that he lives with wife and his son lives across the street from him and assist with his care.  Patient expressed understanding of PT recommendation and declined SNF placement at time of discharge stating that he has "already been in the hospital too long".  Patient has received  5 COVID vaccines. . No further questions reported at this time.   13:19-  CSW attempted to contact the patient's wife to verify care/family support should the patient return home and there was no answer.  CSW left a VM requesting a returned call.   Skilled Nursing Rehab Facilities-   [email or web address]   Ratings out of 5 possible   Name Address  Phone # [place] [facility] Overall  [facility] [address], [place] 5 5 2 4   [facility]  [address] [place], [place] [phone] 4 2 5 5   [facility] [place], [place] 4 1 1 1   [place], [place] 2 2 4 4   [facility] [address] [place] 2 1 1 1   [place] 3 1 4 3   [facility] [address], [place] 5 2 2 3   [facility] [address], [place] 4 1 2 1   [facility] 5 1 2 2   [facility] [phone], [place] [phone] 1 1 1   [facility] [address] [facility] [phone] 4 1 2 1   [facility]  [place] [phone] 4 1 1 1       Expected Discharge Plan: Skilled Nursing Facility Barriers to Discharge: Continued Medical Work up, Other (must enter comment) (Patient refusing SNF, verifying natural supports)   Patient Goals and CMS Choice Patient states their goals for this hospitalization and ongoing recovery are:: To go home CMS Medicare.gov Compare Post Acute Care list provided to:: Patient Choice offered to / list presented to : Patient  Expected Discharge Plan and Services Expected Discharge Plan: [place] arrangements for the past 2 months: Single Family Home                                      Prior Living Arrangements/Services Living arrangements for the past 2 months: Single Family Home Lives with:: Spouse Patient language and need for interpreter reviewed:: Yes Do you feel safe going back to the place where you live?: Yes      Need for Family Participation in Patient Care: Yes (Comment) Care giver support system in place?: Yes (comment)   Criminal Activity/Legal Involvement Pertinent to Current Situation/Hospitalization: No - Comment as needed  Activities of Daily Living Home Assistive Devices/Equipment: Other (Comment) ADL Screening (condition at time of admission) Patient's cognitive ability adequate to safely complete daily activities?: No Is the patient  deaf or have difficulty hearing?: No Does the patient have difficulty seeing, even when wearing glasses/contacts?: No Does the patient have difficulty concentrating, remembering, or making decisions?: Yes Patient able to express need for assistance with ADLs?: No Does the patient have difficulty dressing or bathing?: Yes Independently performs ADLs?: No Does the patient have difficulty walking or climbing stairs?: Yes Weakness of Legs: Both Weakness of Arms/Hands: Both  Permission Sought/Granted Permission sought to share information with :  Facility Sport and exercise psychologist, Family Supports Permission granted to share information with : Yes, Verbal Permission Granted              Emotional Assessment   Attitude/Demeanor/Rapport: Engaged   Orientation: : Oriented to Self, Oriented to Place, Oriented to  Time, Oriented to Situation Alcohol / Substance Use: Not Applicable Psych Involvement: No (comment)  Admission diagnosis:  Weakness [R53.1] Acute cough [R05.1] COVID-19 [U07.1] Patient Active Problem List   Diagnosis Date Noted   COVID-19 05/15/2021   Hypertensive urgency 05/15/2021   Fever 05/15/2021   Renal insufficiency 05/15/2021   History of pulmonary embolism 05/15/2021   Bilateral leg edema 04/05/2021   Prediabetes 04/04/2021   Rash 11/11/2020   Allergic rhinitis 11/08/2020   Impaired glucose tolerance 11/08/2020   Pain due to onychomycosis of toenails of both feet 02/14/2020   Impacted cerumen, bilateral 09/14/2019   Unspecified hearing loss, unspecified ear 09/14/2019   Memory difficulties 08/24/2019   Frequent falls 08/11/2018   Poor balance 08/11/2018   Physical deconditioning 08/11/2018   Neck pain 02/24/2017   Cervical paraspinal muscle spasm 12/23/2016   CAD (coronary artery disease) 12/03/2015   Atrial fibrillation ([place]) 11/21/2015   STEMI (ST elevation myocardial infarction) ([place]) 11/19/2015   Personal history of transient ischemic attack (TIA), and cerebral infarction without residual deficits 05/31/2014   Pulmonary embolism ([place]) 05/20/2014   Prostate cancer ([place]) 04/05/2014   Lumbar spondylosis 11/15/2013   Fall at home, initial encounter 05/10/2013   Hyperlipidemia    Benign essential tremor    Lumbago    Palpitations    PCP:  [name], MD Pharmacy:   [id number] [place], [place] [address] [place] [address] Phone: [phone] Fax: [phone]  [id number] [place], [place] [address] [place] [address] Phone: [phone] Fax: [phone]     Social Determinants of Health (SDOH) Interventions    Readmission Risk Interventions No flowsheet data found.

## 2021-05-17 LAB — COMPREHENSIVE METABOLIC PANEL
ALT: 41 U/L (ref 0–44)
AST: 68 U/L — ABNORMAL HIGH (ref 15–41)
Albumin: 2.4 g/dL — ABNORMAL LOW (ref 3.5–5.0)
Alkaline Phosphatase: 46 U/L (ref 38–126)
Anion gap: 8 (ref 5–15)
BUN: 45 mg/dL — ABNORMAL HIGH (ref 8–23)
CO2: 25 mmol/L (ref 22–32)
Calcium: 8.3 mg/dL — ABNORMAL LOW (ref 8.9–10.3)
Chloride: 105 mmol/L (ref 98–111)
Creatinine, Ser: 1.17 mg/dL (ref 0.61–1.24)
GFR, Estimated: 58 mL/min — ABNORMAL LOW (ref >60)
Glucose, Bld: 139 mg/dL — ABNORMAL HIGH (ref 70–99)
Potassium: 3.7 mmol/L (ref 3.5–5.1)
Sodium: 138 mmol/L (ref 135–145)
Total Bilirubin: 0.6 mg/dL (ref 0.3–1.2)
Total Protein: 4.7 g/dL — ABNORMAL LOW (ref 6.5–8.1)

## 2021-05-17 LAB — CBC WITH DIFFERENTIAL/PLATELET
Abs Immature Granulocytes: 0.03 10*3/uL (ref 0.00–0.07)
Basophils Absolute: 0 10*3/uL (ref 0.0–0.1)
Basophils Relative: 0 %
Eosinophils Absolute: 0 10*3/uL (ref 0.0–0.5)
Eosinophils Relative: 0 %
HCT: 46.1 % (ref 39.0–52.0)
Hemoglobin: 15.4 g/dL (ref 13.0–17.0)
Immature Granulocytes: 0 %
Lymphocytes Relative: 7 %
Lymphs Abs: 0.7 10*3/uL (ref 0.7–4.0)
MCH: 30.7 pg (ref 26.0–34.0)
MCHC: 33.4 g/dL (ref 30.0–36.0)
MCV: 92 fL (ref 80.0–100.0)
Monocytes Absolute: 1 10*3/uL (ref 0.1–1.0)
Monocytes Relative: 9 %
Neutro Abs: 8.9 10*3/uL — ABNORMAL HIGH (ref 1.7–7.7)
Neutrophils Relative %: 84 %
Platelets: 158 10*3/uL (ref 150–400)
RBC: 5.01 MIL/uL (ref 4.22–5.81)
RDW: 13.7 % (ref 11.5–15.5)
WBC: 10.6 10*3/uL — ABNORMAL HIGH (ref 4.0–10.5)
nRBC: 0 % (ref 0.0–0.2)

## 2021-05-17 LAB — FERRITIN: Ferritin: 92 ng/mL (ref 24–336)

## 2021-05-17 LAB — D-DIMER, QUANTITATIVE: D-Dimer, Quant: 0.88 ug/mL-FEU — ABNORMAL HIGH (ref 0.00–0.50)

## 2021-05-17 LAB — C-REACTIVE PROTEIN: CRP: 9.1 mg/dL — ABNORMAL HIGH (ref <1.0)

## 2021-05-17 LAB — MAGNESIUM: Magnesium: 2 mg/dL (ref 1.7–2.4)

## 2021-05-17 MED ORDER — ALBUTEROL SULFATE HFA 108 (90 BASE) MCG/ACT IN AERS
2.0000 | INHALATION_SPRAY | Freq: Four times a day (QID) | RESPIRATORY_TRACT | Status: DC | PRN
  Filled 2021-05-17: qty 6.7

## 2021-05-17 NOTE — TOC Progression Note (Signed)
Transition of Care (TOC) - Initial/Assessment Note    Patient Details  Name: Henry Franklin. MRN: [id number] Date of Birth: 26-Mar-1927  Transition of Care [facility]) CM/SW Contact:    Milinda Antis, [place] Phone Number: 05/17/2021, 10:45 AM  Clinical Narrative:                 CSW attempted to contact the patient's wife again to discuss discharge planning and to attempt to get the telephone number for the patient's son (POA) that is not listed in the patient's chart.  There was no answer.  CSW left another VM requesting a returned call.  CSW asked RN and MD if they had a phone number for the patient's son.  They did not.  CSW will continue to follow patient.  Expected Discharge Plan: Skilled Nursing Facility Barriers to Discharge: Continued Medical Work up, Other (must enter comment) (Patient refusing SNF, verifying natural supports)   Patient Goals and CMS Choice Patient states their goals for this hospitalization and ongoing recovery are:: To go home CMS Medicare.gov Compare Post Acute Care list provided to:: Patient Choice offered to / list presented to : Patient  Expected Discharge Plan and Services Expected Discharge Plan: [place] arrangements for the past 2 months: Single Family Home                                      Prior Living Arrangements/Services Living arrangements for the past 2 months: Single Family Home Lives with:: Spouse Patient language and need for interpreter reviewed:: Yes Do you feel safe going back to the place where you live?: Yes      Need for Family Participation in Patient Care: Yes (Comment) Care giver support system in place?: Yes (comment)   Criminal Activity/Legal Involvement Pertinent to Current Situation/Hospitalization: No - Comment as needed  Activities of Daily Living Home Assistive Devices/Equipment: Other (Comment) ADL Screening (condition at time of admission) Patient's cognitive ability  adequate to safely complete daily activities?: No Is the patient deaf or have difficulty hearing?: No Does the patient have difficulty seeing, even when wearing glasses/contacts?: No Does the patient have difficulty concentrating, remembering, or making decisions?: Yes Patient able to express need for assistance with ADLs?: No Does the patient have difficulty dressing or bathing?: Yes Independently performs ADLs?: No Does the patient have difficulty walking or climbing stairs?: Yes Weakness of Legs: Both Weakness of Arms/Hands: Both  Permission Sought/Granted Permission sought to share information with : Facility Sport and exercise psychologist, Family Supports Permission granted to share information with : Yes, Verbal Permission Granted              Emotional Assessment   Attitude/Demeanor/Rapport: Engaged   Orientation: : Oriented to Self, Oriented to Place, Oriented to  Time, Oriented to Situation Alcohol / Substance Use: Not Applicable Psych Involvement: No (comment)  Admission diagnosis:  Weakness [R53.1] Acute cough [R05.1] COVID-19 [U07.1] Patient Active Problem List   Diagnosis Date Noted   COVID-19 05/15/2021   Hypertensive urgency 05/15/2021   Fever 05/15/2021   Renal insufficiency 05/15/2021   History of pulmonary embolism 05/15/2021   Bilateral leg edema 04/05/2021   Prediabetes 04/04/2021   Rash 11/11/2020   Allergic rhinitis 11/08/2020   Impaired glucose tolerance 11/08/2020   Pain due to onychomycosis of toenails of both feet 02/14/2020   Impacted cerumen, bilateral 09/14/2019  Unspecified hearing loss, unspecified ear 09/14/2019   Memory difficulties 08/24/2019   Frequent falls 08/11/2018   Poor balance 08/11/2018   Physical deconditioning 08/11/2018   Neck pain 02/24/2017   Cervical paraspinal muscle spasm 12/23/2016   CAD (coronary artery disease) 12/03/2015   Atrial fibrillation ([place]) 11/21/2015   STEMI (ST elevation myocardial infarction) ([place])  11/19/2015   Personal history of transient ischemic attack (TIA), and cerebral infarction without residual deficits 05/31/2014   Pulmonary embolism ([place]) 05/20/2014   Prostate cancer ([place]) 04/05/2014   Lumbar spondylosis 11/15/2013   Fall at home, initial encounter 05/10/2013   Hyperlipidemia    Benign essential tremor    Lumbago    Palpitations    PCP:  [name], MD Pharmacy:   [id number] [place], [place] [address] [place] [address] Phone: [phone] Fax: [phone]  [id number] [place], [place] [address] [place] [address] Phone: [phone] Fax: [phone]     Social Determinants of Health (SDOH) Interventions    Readmission Risk Interventions No flowsheet data found.

## 2021-05-17 NOTE — Progress Notes (Signed)
Patient ID: Henry Franklin., male   DOB: Nov 10, 1926, 87 y.o.   MRN: [id number]  PROGRESS NOTE    Henry Franklin.  DGU:[id number] DOB: 08-28-1926 DOA: 05/14/2021 PCP: [name], MD   Brief Narrative:  87 y.o. male with medical history significant of hypertension, hyperlipidemia, ischemic cardiomyopathy, CAD, PAF on Eliquis, history of PE in 2015, essential tremor, and reports of dementia presented with worsening weakness and fall.  Patient was found to be COVID-positive at home.  In route with EMS, patient was febrile to 101.  On presentation to the ED, he was tachypneic with creatinine of 1.32 and lactic acid of 1.2.  Chest x-ray showed mild bibasilar airspace disease greater on the left than the right, left lower lung pneumonia could not be excluded.  CT of the head and cervical spine was negative for any acute abnormalities.  COVID-19 testing was positive.  He was started on IV remdesivir and subsequently on Solu-Medrol as well.  Assessment & Plan:   COVID-19 pneumonia Generalized weakness and fall secondary to above -Presented with worsening generalized weakness and fall.  CT of the head and neck negative for any acute abnormality. -Chest x-ray: Could not rule out left lower lobe pneumonia. -Currently on room air.  Continue IV remdesivir and Solu-Medrol.  Monitor daily inflammatory markers.   [place]    05/15/21 1259 05/15/21 2139 05/16/21 0656 05/17/21 0450  DDIMER  --  1.62* 1.44* 0.88*  FERRITIN 91  --  97 92  LDH 302*  --   --   --   CRP 5.4*  --  9.4* 9.1*    Lab Results  Component Value Date   SARSCOV2NAA POSITIVE (A) 05/14/2021   -Continue inhalers as needed and supplemental oxygen as needed. -Follow cultures.  Doubt that patient has bacterial infection.  Procalcitonin on presentation was 0.1.  Acute kidney injury -Creatinine 1.32 on presentation; 1.06 on 10/05/2020.  Treated with IV fluids.  Improving to 1.17 today.  Encourage oral  intake.  Paroxysmal A. fib on chronic anticoagulation -Rate controlled.  Continue Eliquis.  Outpatient follow-up with cardiology  Hypertensive urgency -Blood pressure improving.  Continue lisinopril.  History of PE in 2015 -Continue Eliquis  Hyperlipidemia -Statin on hold due to elevated liver enzymes  Elevated LFTs -Questionable cause.  Improving.  Monitor   Elevated CK -Possibly from fall and mild rhabdomyolysis.    Memory difficulties -Patient's son reported that his father likely had some dementia -Delirium precautions.  Fall precautions.  PT eval. -After discussion with patient's son on 05/16/2021, son decided to change his father's CODE STATUS to DNR. -Palliative care consultation for goals of care discussion  Spinal stenosis -At baseline, patient walks with the use of walker and has a history of severe spinal stenosis previously documented by MRI. -PT recommended SNF placement.  Social worker consulted  DVT prophylaxis: Eliquis Code Status: Full Family Communication: Son at bedside on 05/16/2021 Disposition Plan: Status is: inpatient because: Of need for IV Solu-Medrol and remdesivir; need for nursing home placement  Consultants: palliative care  Procedures: None  Antimicrobials:  Anti-infectives (From admission, onward)    Start     Dose/Rate Route Frequency Ordered Stop   05/16/21 1000  remdesivir 100 mg in sodium chloride 0.9 % 100 mL IVPB       See Hyperspace for full Linked Orders Report.   100 mg 200 mL/hr over 30 Minutes Intravenous Daily 05/15/21 1035 05/20/21 0959   05/15/21 1045  remdesivir  200 mg in sodium chloride 0.9% 250 mL IVPB       See Hyperspace for full Linked Orders Report.   200 mg 580 mL/hr over 30 Minutes Intravenous Once 05/15/21 1035 05/15/21 1635        Subjective: Patient seen and examined at bedside.  Poor historian.  No overnight fever, vomiting, seizures reported.  Oral intake is still poor.  Still feels  weak. Objective: Vitals:   05/16/21 0934 05/16/21 1528 05/16/21 2241 05/17/21 0548  BP: 101/64 (!) 125/59 140/85 126/62  Pulse: (!) 56 73 (!) 105 84  Resp: 18 18 18 18   Temp: 99.4 F (37.4 C) 98.7 F (37.1 C) 98.6 F (37 C) 98.4 F (36.9 C)  TempSrc:   Axillary Axillary  SpO2: 92% 96% 98% 90%  Weight:      Height:        Intake/Output Summary (Last 24 hours) at 05/17/2021 0807 Last data filed at 05/17/2021 0610 Gross per 24 hour  Intake 390 ml  Output 700 ml  Net -310 ml   Filed Weights   05/14/21 1955  Weight: 83 kg    Examination:  General exam: No distress.  Still on room air.  Elderly male lying in bed.  Hard of hearing. Respiratory system: Decreased breath sounds at bases bilateral with some crackles  cardiovascular system: Intermittently tachycardic; S1-S2 heard gastrointestinal system: Abdomen is distended slightly, soft and nontender.  Bowel sounds are heard  extremities: Mild lower extremity edema present; no clubbing Central nervous system: Extremely slow to respond; alert.  Poor historian.  No focal neurological deficits.  Moves extremities Skin: No obvious ecchymosis/lesions  psychiatry: Flat affect  Data Reviewed: I have personally reviewed following labs and imaging studies  CBC: Recent Labs  Lab 05/14/21 2016 05/16/21 0656 05/17/21 0450  WBC 7.3 9.9 10.6*  NEUTROABS  --  7.9* 8.9*  HGB 16.0 15.8 15.4  HCT 48.1 48.3 46.1  MCV 94.3 94.3 92.0  PLT 162 155 333    Basic Metabolic Panel: Recent Labs  Lab 05/14/21 2016 05/15/21 2139 05/16/21 0656 05/17/21 0450  NA 135 136 139 138  K 4.1 4.0 4.3 3.7  CL 102 104 105 105  CO2 25 22 24 25   GLUCOSE 141* 137* 120* 139*  BUN 25* 27* 32* 45*  CREATININE 1.32* 1.25* 1.35* 1.17  CALCIUM 8.7* 8.2* 8.4* 8.3*  MG  --   --  2.0 2.0  PHOS  --   --  3.9  --     GFR: Estimated Creatinine Clearance: 42.9 mL/min (by C-G formula based on SCr of 1.17 mg/dL). Liver Function Tests: Recent Labs  Lab  05/15/21 1259 05/16/21 0656 05/17/21 0450  AST 149* 98* 68*  ALT 48* 41 41  ALKPHOS 61 50 46  BILITOT 0.7 0.6 0.6  PROT 5.9* 5.1* 4.7*  ALBUMIN 3.2* 2.7* 2.4*    No results for input(s): LIPASE, AMYLASE in the last 168 hours. No results for input(s): AMMONIA in the last 168 hours. Coagulation Profile: No results for input(s): INR, PROTIME in the last 168 hours. Cardiac Enzymes: Recent Labs  Lab 05/15/21 2139  CKTOTAL 2,527*    BNP (last 3 results) No results for input(s): PROBNP in the last 8760 hours. HbA1C: No results for input(s): HGBA1C in the last 72 hours. CBG: No results for input(s): GLUCAP in the last 168 hours. Lipid Profile: No results for input(s): CHOL, HDL, LDLCALC, TRIG, CHOLHDL, LDLDIRECT in the last 72 hours. Thyroid Function Tests: No results for  input(s): TSH, T4TOTAL, FREET4, T3FREE, THYROIDAB in the last 72 hours. Anemia Panel: Recent Labs    05/16/21 0656 05/17/21 0450  FERRITIN 97 92    Sepsis Labs: Recent Labs  Lab 05/15/21 0925 05/15/21 1259  PROCALCITON  --  0.10  LATICACIDVEN 1.2 1.2     Recent Results (from the past 240 hour(s))  Resp Panel by RT-PCR (Flu A&B, Covid) Nasopharyngeal Swab     Status: Abnormal   Collection Time: 05/14/21  8:08 PM   Specimen: Nasopharyngeal Swab; Nasopharyngeal(NP) swabs in vial transport medium  Result Value Ref Range Status   SARS Coronavirus 2 by RT PCR POSITIVE (A) NEGATIVE Final    Comment: (NOTE) SARS-CoV-2 target nucleic acids are DETECTED.  The SARS-CoV-2 RNA is generally detectable in upper respiratory specimens during the acute phase of infection. Positive results are indicative of the presence of the identified virus, but do not rule out bacterial infection or co-infection with other pathogens not detected by the test. Clinical correlation with patient history and other diagnostic information is necessary to determine patient infection status. The expected result is Negative.  Fact  Sheet for Patients: EntrepreneurPulse.com.au  Fact Sheet for Healthcare Providers: IncredibleEmployment.be  This test is not yet approved or cleared by the [place] FDA and  has been authorized for detection and/or diagnosis of SARS-CoV-2 by FDA under an Emergency Use Authorization (EUA).  This EUA will remain in effect (meaning this test can be used) for the duration of  the COVID-19 declaration under Section 564(b)(1) of the A ct, 21 U.S.C. section 360bbb-3(b)(1), unless the authorization is terminated or revoked sooner.     Influenza A by PCR NEGATIVE NEGATIVE Final   Influenza B by PCR NEGATIVE NEGATIVE Final    Comment: (NOTE) The Xpert Xpress SARS-CoV-2/FLU/RSV plus assay is intended as an aid in the diagnosis of influenza from Nasopharyngeal swab specimens and should not be used as a sole basis for treatment. Nasal washings and aspirates are unacceptable for Xpert Xpress SARS-CoV-2/FLU/RSV testing.  Fact Sheet for Patients: EntrepreneurPulse.com.au  Fact Sheet for Healthcare Providers: IncredibleEmployment.be  This test is not yet approved or cleared by the [place] FDA and has been authorized for detection and/or diagnosis of SARS-CoV-2 by FDA under an Emergency Use Authorization (EUA). This EUA will remain in effect (meaning this test can be used) for the duration of the COVID-19 declaration under Section 564(b)(1) of the Act, 21 U.S.C. section 360bbb-3(b)(1), unless the authorization is terminated or revoked.  Performed at [facility], [place] [address], [place], [place] [address]   Culture, blood (routine x 2)     Status: None (Preliminary result)   Collection Time: 05/15/21  8:56 AM   Specimen: BLOOD RIGHT HAND  Result Value Ref Range Status   Specimen Description BLOOD RIGHT HAND  Final   Special Requests   Final    BOTTLES DRAWN AEROBIC ONLY Blood Culture results may not be  optimal due to an inadequate volume of blood received in culture bottles   Culture   Final    NO GROWTH 2 DAYS Performed at [facility], [place] [address], [place], [place] [address]    Report Status PENDING  Incomplete  Culture, blood (routine x 2)     Status: None (Preliminary result)   Collection Time: 05/15/21  9:07 AM   Specimen: BLOOD  Result Value Ref Range Status   Specimen Description BLOOD LEFT ANTECUBITAL  Final   Special Requests   Final  BOTTLES DRAWN AEROBIC AND ANAEROBIC Blood Culture results may not be optimal due to an inadequate volume of blood received in culture bottles   Culture   Final    NO GROWTH 2 DAYS Performed at [facility], [place] [address] [place], [place] [address]    Report Status PENDING  Incomplete          Radiology Studies: CT Head Wo Contrast  Result Date: 05/15/2021 CLINICAL DATA:  Head trauma, fall, generalized weakness for few days, tested positive for COVID-19, moderate to severe head trauma EXAM: CT HEAD WITHOUT CONTRAST CT CERVICAL SPINE WITHOUT CONTRAST TECHNIQUE: Multidetector CT imaging of the head and cervical spine was performed following the standard protocol without intravenous contrast. Multiplanar CT image reconstructions of the cervical spine were also generated. COMPARISON:  05/01/2004 FINDINGS: CT HEAD FINDINGS Brain: Motion artifacts, for which repeat imaging was performed. Generalized atrophy. Ex vacuo dilatation of the ventricular system unchanged. No midline shift or mass effect. Small vessel chronic ischemic changes of deep cerebral white matter. Tiny old lacunar infarct RIGHT thalamus unchanged. Old RIGHT frontal infarct with associated dystrophic calcifications, question related to prior infarction or old underlying vascular malformation. Appearance is unchanged from the previous exam. No intracranial hemorrhage, mass lesion, or evidence of acute infarction. No extra-axial fluid collections. Vascular:  Atherosclerotic calcification of internal carotid and vertebral arteries at skull base Skull: Intact Sinuses/Orbits: Clear Other: N/A CT CERVICAL SPINE FINDINGS Alignment: Minimal retrolisthesis at C5-C6. Remaining alignments normal. Skull base and vertebrae: Osseous demineralization. Skull base intact. Vertebral body heights maintained. Multilevel degenerative disc disease changes with disc space narrowing and endplate spur formation at C4-C7. Encroachment upon BILATERAL cervical neural foramina at multiple levels by uncovertebral spurs. Severe multilevel facet degenerative changes. No fracture, additional subluxation, or bone destruction. Extensive degenerative changes between the odontoid process and anterior arch of C1. Soft tissues and spinal canal: Prevertebral soft tissues normal thickness. Extensive atherosclerotic calcifications LEFT greater than RIGHT carotid bifurcations. Tiny BILATERAL thyroid nodules up to 1.1 cm diameter; Not clinically significant; no follow-up imaging recommended (ref: J Am Coll Radiol. 2015 Feb;12(2): 143-50). Disc levels: Scattered bulging discs and AP narrowing of cervical spinal canal, primarily by endplate spurring. Upper chest: LEFT apical scarring. Other: N/A IMPRESSION: Atrophy with small vessel chronic ischemic changes of deep cerebral white matter. 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 No acute intracranial abnormalities. Multilevel degenerative disc and facet disease changes of the cervical spine as above. No acute cervical spine abnormalities. Electronically Signed   By: Lavonia Dana M.D.   On: 05/15/2021 09:19   CT Cervical Spine Wo Contrast  Result Date: 05/15/2021 CLINICAL DATA:  Head trauma, fall, generalized weakness for few days, tested positive for COVID-19, moderate to severe head trauma EXAM: CT HEAD WITHOUT CONTRAST CT CERVICAL SPINE WITHOUT CONTRAST TECHNIQUE: Multidetector CT imaging of the head and cervical spine was performed following the  standard protocol without intravenous contrast. Multiplanar CT image reconstructions of the cervical spine were also generated. COMPARISON:  05/01/2004 FINDINGS: CT HEAD FINDINGS Brain: Motion artifacts, for which repeat imaging was performed. Generalized atrophy. Ex vacuo dilatation of the ventricular system unchanged. No midline shift or mass effect. Small vessel chronic ischemic changes of deep cerebral white matter. Tiny old lacunar infarct RIGHT thalamus unchanged. Old RIGHT frontal infarct with associated dystrophic calcifications, question related to prior infarction or old underlying vascular malformation. Appearance is unchanged from the previous exam. No intracranial hemorrhage, mass lesion, or evidence of acute infarction. No extra-axial fluid  collections. Vascular: Atherosclerotic calcification of internal carotid and vertebral arteries at skull base Skull: Intact Sinuses/Orbits: Clear Other: N/A CT CERVICAL SPINE FINDINGS Alignment: Minimal retrolisthesis at C5-C6. Remaining alignments normal. Skull base and vertebrae: Osseous demineralization. Skull base intact. Vertebral body heights maintained. Multilevel degenerative disc disease changes with disc space narrowing and endplate spur formation at C4-C7. Encroachment upon BILATERAL cervical neural foramina at multiple levels by uncovertebral spurs. Severe multilevel facet degenerative changes. No fracture, additional subluxation, or bone destruction. Extensive degenerative changes between the odontoid process and anterior arch of C1. Soft tissues and spinal canal: Prevertebral soft tissues normal thickness. Extensive atherosclerotic calcifications LEFT greater than RIGHT carotid bifurcations. Tiny BILATERAL thyroid nodules up to 1.1 cm diameter; Not clinically significant; no follow-up imaging recommended (ref: J Am Coll Radiol. 2015 Feb;12(2): 143-50). Disc levels: Scattered bulging discs and AP narrowing of cervical spinal canal, primarily by endplate  spurring. Upper chest: LEFT apical scarring. Other: N/A IMPRESSION: Atrophy with small vessel chronic ischemic changes of deep cerebral white matter. 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 No acute intracranial abnormalities. Multilevel degenerative disc and facet disease changes of the cervical spine as above. No acute cervical spine abnormalities. Electronically Signed   By: Lavonia Dana M.D.   On: 05/15/2021 09:19   DG Chest Port 1 View  Result Date: 05/15/2021 CLINICAL DATA:  Dyspnea.  Weakness.  COVID positive. EXAM: PORTABLE CHEST 1 VIEW COMPARISON:  06/19/2016 FINDINGS: Single AP portable view of the chest with overlying wires and leads. Midline trachea. Mild cardiomegaly. Atherosclerosis in the transverse aorta. No pleural effusion or pneumothorax. No congestive failure. Mild left greater than right base airspace disease medially. IMPRESSION: Mild bibasilar airspace disease, greater left than right. Favor atelectasis. Especially at the left lung base, pneumonia cannot be excluded. Consider PA and lateral radiographs if feasible. Cardiomegaly without congestive failure. Aortic Atherosclerosis (ICD10-I70.0). Electronically Signed   By: Abigail Miyamoto M.D.   On: 05/15/2021 08:17        Scheduled Meds:  albuterol  2 puff Inhalation Q6H   apixaban  5 mg Oral BID   vitamin C  500 mg Oral Daily   lisinopril  2.5 mg Oral Daily   methylPREDNISolone (SOLU-MEDROL) injection  60 mg Intravenous Daily   sodium chloride flush  3 mL Intravenous Q12H   zinc sulfate  220 mg Oral Daily   Continuous Infusions:  chlorproMAZINE (THORAZINE) IV 12.5 mg (05/17/21 0305)   remdesivir 100 mg in NS 100 mL Stopped (05/16/21 0958)          Aline August, MD [facility] 05/17/2021, 8:07 AM

## 2021-05-18 LAB — COMPREHENSIVE METABOLIC PANEL
ALT: 39 U/L (ref 0–44)
AST: 49 U/L — ABNORMAL HIGH (ref 15–41)
Albumin: 2.4 g/dL — ABNORMAL LOW (ref 3.5–5.0)
Alkaline Phosphatase: 51 U/L (ref 38–126)
Anion gap: 4 — ABNORMAL LOW (ref 5–15)
BUN: 43 mg/dL — ABNORMAL HIGH (ref 8–23)
CO2: 28 mmol/L (ref 22–32)
Calcium: 8.3 mg/dL — ABNORMAL LOW (ref 8.9–10.3)
Chloride: 105 mmol/L (ref 98–111)
Creatinine, Ser: 1.11 mg/dL (ref 0.61–1.24)
GFR, Estimated: >60 mL/min (ref >60)
Glucose, Bld: 143 mg/dL — ABNORMAL HIGH (ref 70–99)
Potassium: 3.8 mmol/L (ref 3.5–5.1)
Sodium: 137 mmol/L (ref 135–145)
Total Bilirubin: 0.6 mg/dL (ref 0.3–1.2)
Total Protein: 4.8 g/dL — ABNORMAL LOW (ref 6.5–8.1)

## 2021-05-18 LAB — CBC WITH DIFFERENTIAL/PLATELET
Abs Immature Granulocytes: 0.03 10*3/uL (ref 0.00–0.07)
Basophils Absolute: 0 10*3/uL (ref 0.0–0.1)
Basophils Relative: 0 %
Eosinophils Absolute: 0 10*3/uL (ref 0.0–0.5)
Eosinophils Relative: 0 %
HCT: 47.2 % (ref 39.0–52.0)
Hemoglobin: 16 g/dL (ref 13.0–17.0)
Immature Granulocytes: 0 %
Lymphocytes Relative: 7 %
Lymphs Abs: 0.6 10*3/uL — ABNORMAL LOW (ref 0.7–4.0)
MCH: 31.3 pg (ref 26.0–34.0)
MCHC: 33.9 g/dL (ref 30.0–36.0)
MCV: 92.2 fL (ref 80.0–100.0)
Monocytes Absolute: 0.6 10*3/uL (ref 0.1–1.0)
Monocytes Relative: 7 %
Neutro Abs: 8 10*3/uL — ABNORMAL HIGH (ref 1.7–7.7)
Neutrophils Relative %: 86 %
Platelets: 168 10*3/uL (ref 150–400)
RBC: 5.12 MIL/uL (ref 4.22–5.81)
RDW: 13.6 % (ref 11.5–15.5)
WBC: 9.2 10*3/uL (ref 4.0–10.5)
nRBC: 0 % (ref 0.0–0.2)

## 2021-05-18 LAB — FERRITIN: Ferritin: 96 ng/mL (ref 24–336)

## 2021-05-18 LAB — D-DIMER, QUANTITATIVE: D-Dimer, Quant: 0.67 ug/mL-FEU — ABNORMAL HIGH (ref 0.00–0.50)

## 2021-05-18 LAB — MAGNESIUM: Magnesium: 2 mg/dL (ref 1.7–2.4)

## 2021-05-18 LAB — C-REACTIVE PROTEIN: CRP: 6.4 mg/dL — ABNORMAL HIGH (ref <1.0)

## 2021-05-18 NOTE — Progress Notes (Signed)
Patient ID: Henry Carneiro., male   DOB: March 27, 1927, 87 y.o.   MRN: [id number]  PROGRESS NOTE    Henry Franklin.  MAU:[id number] DOB: 1927/04/26 DOA: 05/14/2021 PCP: [name], MD   Brief Narrative:  87 y.o. male with medical history significant of hypertension, hyperlipidemia, ischemic cardiomyopathy, CAD, PAF on Eliquis, history of PE in 2015, essential tremor, and reports of dementia presented with worsening weakness and fall.  Patient was found to be COVID-positive at home.  In route with EMS, patient was febrile to 101.  On presentation to the ED, he was tachypneic with creatinine of 1.32 and lactic acid of 1.2.  Chest x-ray showed mild bibasilar airspace disease greater on the left than the right, left lower lung pneumonia could not be excluded.  CT of the head and cervical spine was negative for any acute abnormalities.  COVID-19 testing was positive.  He was started on IV remdesivir and subsequently on Solu-Medrol as well.  Assessment & Plan:   COVID-19 pneumonia Generalized weakness and fall secondary to above -Presented with worsening generalized weakness and fall.  CT of the head and neck negative for any acute abnormality. -Chest x-ray: Could not rule out left lower lobe pneumonia. -Currently on room air.  Continue IV remdesivir for 5 days and Solu-Medrol for total of 10 days.  Monitor daily inflammatory markers.   [facility]    05/15/21 1259 05/15/21 2139 05/16/21 0656 05/17/21 0450 05/18/21 0257  DDIMER  --    < > 1.44* 0.88* 0.67*  FERRITIN 91  --  97 92 96  LDH 302*  --   --   --   --   CRP 5.4*  --  9.4* 9.1* 6.4*   < > = values in this interval not displayed.     Lab Results  Component Value Date   SARSCOV2NAA POSITIVE (A) 05/14/2021   -Continue inhalers as needed and supplemental oxygen as needed. -Follow cultures.  Doubt that patient has bacterial infection.  Procalcitonin on presentation was 0.1.  Acute kidney injury -Creatinine 1.32  on presentation; 1.06 on 10/05/2020.  Treated with IV fluids.  Improving to 1.11 today.  Encourage oral intake.  Paroxysmal A. fib on chronic anticoagulation -Rate controlled.  Continue Eliquis.  Outpatient follow-up with cardiology  Hypertensive urgency -Blood pressure improving.  Continue lisinopril.  History of PE in 2015 -Continue Eliquis  Hyperlipidemia -Statin on hold due to elevated liver enzymes  Elevated LFTs -Questionable cause.  Improving.  Monitor   Elevated CK -Possibly from fall and mild rhabdomyolysis.    Memory difficulties -Patient's son reported that his father likely had some dementia -Delirium precautions.  Fall precautions.  PT eval. -After discussion with patient's son on 05/16/2021, son decided to change his father's CODE STATUS to DNR. -Palliative care consultation for goals of care discussion  Spinal stenosis -At baseline, patient walks with the use of walker and has a history of severe spinal stenosis previously documented by MRI. -PT recommended SNF placement.  Social worker consulted  DVT prophylaxis: Eliquis Code Status: Full Family Communication: Son at bedside on 05/16/2021 Disposition Plan: Status is: inpatient because: Of need for IV Solu-Medrol and remdesivir; need for nursing home placement  Consultants: palliative care  Procedures: None  Antimicrobials:  Anti-infectives (From admission, onward)    Start     Dose/Rate Route Frequency Ordered Stop   05/16/21 1000  remdesivir 100 mg in sodium chloride 0.9 % 100 mL IVPB  See Hyperspace for full Linked Orders Report.   100 mg 200 mL/hr over 30 Minutes Intravenous Daily 05/15/21 1035 05/20/21 0959   05/15/21 1045  remdesivir 200 mg in sodium chloride 0.9% 250 mL IVPB       See Hyperspace for full Linked Orders Report.   200 mg 580 mL/hr over 30 Minutes Intravenous Once 05/15/21 1035 05/15/21 1635        Subjective: Patient seen and examined at bedside.  Poor historian.  No  worsening shortness of breath, seizures, vomiting or fever reported.  Oral intake is improving but he still feels very weak.   Objective: Vitals:   05/17/21 0822 05/17/21 1728 05/17/21 2121 05/18/21 0348  BP: (!) 98/55 (!) 129/91 102/72 120/83  Pulse: 92 (!) 104 (!) 104 (!) 104  Resp: 18 19 19 18   Temp: 98.2 F (36.8 C) 97.7 F (36.5 C) 98.5 F (36.9 C) (!) 97.5 F (36.4 C)  TempSrc:   Oral Axillary  SpO2: 96% 96% 94% 95%  Weight:      Height:        Intake/Output Summary (Last 24 hours) at 05/18/2021 0738 Last data filed at 05/18/2021 0407 Gross per 24 hour  Intake 635 ml  Output 1425 ml  Net -790 ml    Filed Weights   05/14/21 1955  Weight: 83 kg    Examination:  General exam: Currently still on room air.  No acute distress.  Elderly male lying in bed.  Hard of hearing. Respiratory system: Bilateral decreased breath sounds at bases with scattered crackles  cardiovascular system: S1-S2 heard; mildly tachycardic intermittently  gastrointestinal system: Abdomen is mildly distended; soft and nontender.  Normal bowel sounds heard extremities: No cyanosis; trace lower extremity edema present Central nervous system: Awake; still very slow to respond.  Poor historian.  No focal neurological deficits.  Moving extremities Skin: No obvious petechiae/rashes  psychiatry: Affect is flat  Data Reviewed: I have personally reviewed following labs and imaging studies  CBC: Recent Labs  Lab 05/14/21 2016 05/16/21 0656 05/17/21 0450 05/18/21 0257  WBC 7.3 9.9 10.6* 9.2  NEUTROABS  --  7.9* 8.9* 8.0*  HGB 16.0 15.8 15.4 16.0  HCT 48.1 48.3 46.1 47.2  MCV 94.3 94.3 92.0 92.2  PLT 162 155 158 329    Basic Metabolic Panel: Recent Labs  Lab 05/14/21 2016 05/15/21 2139 05/16/21 0656 05/17/21 0450 05/18/21 0257  NA 135 136 139 138 137  K 4.1 4.0 4.3 3.7 3.8  CL 102 104 105 105 105  CO2 25 22 24 25 28   GLUCOSE 141* 137* 120* 139* 143*  BUN 25* 27* 32* 45* 43*   CREATININE 1.32* 1.25* 1.35* 1.17 1.11  CALCIUM 8.7* 8.2* 8.4* 8.3* 8.3*  MG  --   --  2.0 2.0 2.0  PHOS  --   --  3.9  --   --     GFR: Estimated Creatinine Clearance: 45.2 mL/min (by C-G formula based on SCr of 1.11 mg/dL). Liver Function Tests: Recent Labs  Lab 05/15/21 1259 05/16/21 0656 05/17/21 0450 05/18/21 0257  AST 149* 98* 68* 49*  ALT 48* 41 41 39  ALKPHOS 61 50 46 51  BILITOT 0.7 0.6 0.6 0.6  PROT 5.9* 5.1* 4.7* 4.8*  ALBUMIN 3.2* 2.7* 2.4* 2.4*    No results for input(s): LIPASE, AMYLASE in the last 168 hours. No results for input(s): AMMONIA in the last 168 hours. Coagulation Profile: No results for input(s): INR, PROTIME in the last 168  hours. Cardiac Enzymes: Recent Labs  Lab 05/15/21 2139  CKTOTAL 2,527*    BNP (last 3 results) No results for input(s): PROBNP in the last 8760 hours. HbA1C: No results for input(s): HGBA1C in the last 72 hours. CBG: No results for input(s): GLUCAP in the last 168 hours. Lipid Profile: No results for input(s): CHOL, HDL, LDLCALC, TRIG, CHOLHDL, LDLDIRECT in the last 72 hours. Thyroid Function Tests: No results for input(s): TSH, T4TOTAL, FREET4, T3FREE, THYROIDAB in the last 72 hours. Anemia Panel: Recent Labs    05/17/21 0450 05/18/21 0257  FERRITIN 92 96    Sepsis Labs: Recent Labs  Lab 05/15/21 0925 05/15/21 1259  PROCALCITON  --  0.10  LATICACIDVEN 1.2 1.2     Recent Results (from the past 240 hour(s))  Resp Panel by RT-PCR (Flu A&B, Covid) Nasopharyngeal Swab     Status: Abnormal   Collection Time: 05/14/21  8:08 PM   Specimen: Nasopharyngeal Swab; Nasopharyngeal(NP) swabs in vial transport medium  Result Value Ref Range Status   SARS Coronavirus 2 by RT PCR POSITIVE (A) NEGATIVE Final    Comment: (NOTE) SARS-CoV-2 target nucleic acids are DETECTED.  The SARS-CoV-2 RNA is generally detectable in upper respiratory specimens during the acute phase of infection. Positive results are indicative  of the presence of the identified virus, but do not rule out bacterial infection or co-infection with other pathogens not detected by the test. Clinical correlation with patient history and other diagnostic information is necessary to determine patient infection status. The expected result is Negative.  Fact Sheet for Patients: EntrepreneurPulse.com.au  Fact Sheet for Healthcare Providers: IncredibleEmployment.be  This test is not yet approved or cleared by the [place] FDA and  has been authorized for detection and/or diagnosis of SARS-CoV-2 by FDA under an Emergency Use Authorization (EUA).  This EUA will remain in effect (meaning this test can be used) for the duration of  the COVID-19 declaration under Section 564(b)(1) of the A ct, 21 U.S.C. section 360bbb-3(b)(1), unless the authorization is terminated or revoked sooner.     Influenza A by PCR NEGATIVE NEGATIVE Final   Influenza B by PCR NEGATIVE NEGATIVE Final    Comment: (NOTE) The Xpert Xpress SARS-CoV-2/FLU/RSV plus assay is intended as an aid in the diagnosis of influenza from Nasopharyngeal swab specimens and should not be used as a sole basis for treatment. Nasal washings and aspirates are unacceptable for Xpert Xpress SARS-CoV-2/FLU/RSV testing.  Fact Sheet for Patients: EntrepreneurPulse.com.au  Fact Sheet for Healthcare Providers: IncredibleEmployment.be  This test is not yet approved or cleared by the [place] FDA and has been authorized for detection and/or diagnosis of SARS-CoV-2 by FDA under an Emergency Use Authorization (EUA). This EUA will remain in effect (meaning this test can be used) for the duration of the COVID-19 declaration under Section 564(b)(1) of the Act, 21 U.S.C. section 360bbb-3(b)(1), unless the authorization is terminated or revoked.  Performed at [facility], [place] [address], [place] [address]   Culture, blood (routine x 2)     Status: None (Preliminary result)   Collection Time: 05/15/21  8:56 AM   Specimen: BLOOD RIGHT HAND  Result Value Ref Range Status   Specimen Description BLOOD RIGHT HAND  Final   Special Requests   Final    BOTTLES DRAWN AEROBIC ONLY Blood Culture results may not be optimal due to an inadequate volume of blood received in culture bottles   Culture   Final    NO  GROWTH 2 DAYS Performed at [facility], [place] [address] [place], [place] [address]    Report Status PENDING  Incomplete  Culture, blood (routine x 2)     Status: None (Preliminary result)   Collection Time: 05/15/21  9:07 AM   Specimen: BLOOD  Result Value Ref Range Status   Specimen Description BLOOD LEFT ANTECUBITAL  Final   Special Requests   Final    BOTTLES DRAWN AEROBIC AND ANAEROBIC Blood Culture results may not be optimal due to an inadequate volume of blood received in culture bottles   Culture   Final    NO GROWTH 2 DAYS Performed at [facility], [place] [address] [place], [place] [address]    Report Status PENDING  Incomplete          Radiology Studies: No results found.      Scheduled Meds:  apixaban  5 mg Oral BID   vitamin C  500 mg Oral Daily   lisinopril  2.5 mg Oral Daily   methylPREDNISolone (SOLU-MEDROL) injection  60 mg Intravenous Daily   sodium chloride flush  3 mL Intravenous Q12H   zinc sulfate  220 mg Oral Daily   Continuous Infusions:  chlorproMAZINE (THORAZINE) IV 12.5 mg (05/17/21 0305)   remdesivir 100 mg in NS 100 mL 100 mg (05/17/21 1125)          Aline August, MD [facility] 05/18/2021, 7:38 AM

## 2021-05-18 NOTE — Plan of Care (Signed)
  Problem: Activity: Goal: Risk for activity intolerance will decrease Outcome: Not Progressing   

## 2021-05-19 LAB — MAGNESIUM: Magnesium: 2.3 mg/dL (ref 1.7–2.4)

## 2021-05-19 LAB — COMPREHENSIVE METABOLIC PANEL
ALT: 42 U/L (ref 0–44)
AST: 48 U/L — ABNORMAL HIGH (ref 15–41)
Albumin: 2.8 g/dL — ABNORMAL LOW (ref 3.5–5.0)
Alkaline Phosphatase: 57 U/L (ref 38–126)
Anion gap: 8 (ref 5–15)
BUN: 39 mg/dL — ABNORMAL HIGH (ref 8–23)
CO2: 27 mmol/L (ref 22–32)
Calcium: 8.7 mg/dL — ABNORMAL LOW (ref 8.9–10.3)
Chloride: 106 mmol/L (ref 98–111)
Creatinine, Ser: 1.17 mg/dL (ref 0.61–1.24)
GFR, Estimated: 58 mL/min — ABNORMAL LOW (ref >60)
Glucose, Bld: 105 mg/dL — ABNORMAL HIGH (ref 70–99)
Potassium: 3.7 mmol/L (ref 3.5–5.1)
Sodium: 141 mmol/L (ref 135–145)
Total Bilirubin: 1.2 mg/dL (ref 0.3–1.2)
Total Protein: 5.4 g/dL — ABNORMAL LOW (ref 6.5–8.1)

## 2021-05-19 LAB — D-DIMER, QUANTITATIVE: D-Dimer, Quant: 0.61 ug/mL-FEU — ABNORMAL HIGH (ref 0.00–0.50)

## 2021-05-19 LAB — C-REACTIVE PROTEIN: CRP: 3.2 mg/dL — ABNORMAL HIGH (ref <1.0)

## 2021-05-19 MED ORDER — BENZOCAINE 10 % MT GEL
Freq: Two times a day (BID) | OROMUCOSAL | Status: DC | PRN
  Filled 2021-05-19 (×2): qty 9

## 2021-05-19 MED ORDER — HALOPERIDOL LACTATE 5 MG/ML IJ SOLN
2.0000 mg | Freq: Four times a day (QID) | INTRAMUSCULAR | Status: DC | PRN
Start: 2021-05-19 — End: 2021-05-24
  Administered 2021-05-19 (×3): 4 mg via INTRAVENOUS
  Administered 2021-05-19: 2 mg via INTRAVENOUS
  Filled 2021-05-19 (×4): qty 1

## 2021-05-19 MED ORDER — METHYLPREDNISOLONE SODIUM SUCC 40 MG IJ SOLR
40.0000 mg | Freq: Every day | INTRAMUSCULAR | Status: DC
  Administered 2021-05-19 – 2021-05-22 (×4): 40 mg via INTRAVENOUS
  Filled 2021-05-19 (×4): qty 1

## 2021-05-19 NOTE — Plan of Care (Signed)
  Problem: Pain Managment: Goal: General experience of comfort will improve Outcome: Progressing   Problem: Safety: Goal: Ability to remain free from injury will improve Outcome: Progressing   

## 2021-05-19 NOTE — Plan of Care (Signed)
  Problem: Coping: Goal: Level of anxiety will decrease Outcome: Not Progressing   

## 2021-05-19 NOTE — Progress Notes (Signed)
Patient ID: Henry Chenier., male   DOB: 18-Dec-1926, 87 y.o.   MRN: [id number]  PROGRESS NOTE    Henry Cotta.  FGH:[id number] DOB: 02/01/1927 DOA: 05/14/2021 PCP: [name], MD   Brief Narrative:  87 y.o. male with medical history significant of hypertension, hyperlipidemia, ischemic cardiomyopathy, CAD, PAF on Eliquis, history of PE in 2015, essential tremor, and reports of dementia presented with worsening weakness and fall.  Patient was found to be COVID-positive at home.  In route with EMS, patient was febrile to 101.  On presentation to the ED, he was tachypneic with creatinine of 1.32 and lactic acid of 1.2.  Chest x-ray showed mild bibasilar airspace disease greater on the left than the right, left lower lung pneumonia could not be excluded.  CT of the head and cervical spine was negative for any acute abnormalities.  COVID-19 testing was positive.  He was started on IV remdesivir and subsequently on Solu-Medrol as well.  Assessment & Plan:   COVID-19 pneumonia Generalized weakness and fall secondary to above -Presented with worsening generalized weakness and fall.  CT of the head and neck negative for any acute abnormality. -Chest x-ray: Could not rule out left lower lobe pneumonia. -Currently on room air.  Continue IV remdesivir for 5 days and Solu-Medrol for total of 10 days.  Monitor daily inflammatory markers.   COVID-19 Labs  Recent Labs    05/16/21 0656 05/17/21 0450 05/18/21 0257  DDIMER 1.44* 0.88* 0.67*  FERRITIN 97 92 96  CRP 9.4* 9.1* 6.4*     Lab Results  Component Value Date   SARSCOV2NAA POSITIVE (A) 05/14/2021   -Continue inhalers as needed and supplemental oxygen as needed. -Follow cultures.  Doubt that patient has bacterial infection.  Procalcitonin on presentation was 0.1.  Acute kidney injury -Creatinine 1.32 on presentation; 1.06 on 10/05/2020.  Treated with IV fluids.  Improving to 1.11 on 05/18/2021.  Encourage oral intake.  Paroxysmal A.  fib on chronic anticoagulation -Rate controlled.  Continue Eliquis.  Outpatient follow-up with cardiology  Hypertensive urgency -Blood pressure improving.  Continue lisinopril.  History of PE in 2015 -Continue Eliquis  Hyperlipidemia -Statin on hold due to elevated liver enzymes  Elevated LFTs -Questionable cause.  Improving.  Monitor   Elevated CK -Possibly from fall and mild rhabdomyolysis.    Memory difficulties -Patient's son reported that his father likely had some dementia -Delirium precautions.  Fall precautions.  PT eval. -After discussion with patient's son on 05/16/2021, son decided to change his father's CODE STATUS to DNR. -Palliative care consultation for goals of care discussion  Spinal stenosis -At baseline, patient walks with the use of walker and has a history of severe spinal stenosis previously documented by MRI. -PT recommended SNF placement.  Social worker consulted  DVT prophylaxis: Eliquis Code Status: Full Family Communication: Son on phone on 05/18/2021: Agreeable for SNF placement for his father  disposition Plan: Status is: inpatient because: Of need for IV Solu-Medrol and remdesivir; need for nursing home placement  Consultants: palliative care  Procedures: None  Antimicrobials:  Anti-infectives (From admission, onward)    Start     Dose/Rate Route Frequency Ordered Stop   05/16/21 1000  remdesivir 100 mg in sodium chloride 0.9 % 100 mL IVPB       See Hyperspace for full Linked Orders Report.   100 mg 200 mL/hr over 30 Minutes Intravenous Daily 05/15/21 1035 05/20/21 0959   05/15/21 1045  remdesivir 200 mg in sodium chloride  0.9% 250 mL IVPB       See Hyperspace for full Linked Orders Report.   200 mg 580 mL/hr over 30 Minutes Intravenous Once 05/15/21 1035 05/15/21 1635        Subjective: Patient seen and examined at bedside.  Poor historian.  No overnight fever, vomiting reported.  He was agitated overnight and required Haldol as  per nursing staff. Objective: Vitals:   05/18/21 0922 05/18/21 1632 05/18/21 2025 05/19/21 0424  BP: 132/77 (!) 147/74 106/87 128/79  Pulse: 100 (!) 58 99 90  Resp: 16 20 20 18   Temp: (!) 97.1 F (36.2 C) 98.2 F (36.8 C) 98 F (36.7 C) 98 F (36.7 C)  TempSrc:   Oral   SpO2: 93% 97% 92% 95%  Weight:      Height:        Intake/Output Summary (Last 24 hours) at 05/19/2021 0643 Last data filed at 05/19/2021 0422 Gross per 24 hour  Intake 840 ml  Output 550 ml  Net 290 ml    Filed Weights   05/14/21 1955  Weight: 83 kg    Examination:  General exam: No distress.  On room air.  Elderly male lying in bed.  Hard of hearing. Respiratory system: Decreased breath sounds at bases bilaterally with some crackles  cardiovascular system: Intermittent mild bradycardia present; S1-S2 heard gastrointestinal system: Abdomen is distended mildly; soft and nontender.  Bowel sounds are heard  extremities: Mild lower extremity edema present; no clubbing  Central nervous system: Wakes up only very slightly; extremely slow to respond.  Poor historian.  No focal neurological deficits.  Moves extremities  skin: No obvious ecchymosis/lesions psychiatry: Could not be assessed because of mental status.  Data Reviewed: I have personally reviewed following labs and imaging studies  CBC: Recent Labs  Lab 05/14/21 2016 05/16/21 0656 05/17/21 0450 05/18/21 0257  WBC 7.3 9.9 10.6* 9.2  NEUTROABS  --  7.9* 8.9* 8.0*  HGB 16.0 15.8 15.4 16.0  HCT 48.1 48.3 46.1 47.2  MCV 94.3 94.3 92.0 92.2  PLT 162 155 158 814    Basic Metabolic Panel: Recent Labs  Lab 05/14/21 2016 05/15/21 2139 05/16/21 0656 05/17/21 0450 05/18/21 0257  NA 135 136 139 138 137  K 4.1 4.0 4.3 3.7 3.8  CL 102 104 105 105 105  CO2 25 22 24 25 28   GLUCOSE 141* 137* 120* 139* 143*  BUN 25* 27* 32* 45* 43*  CREATININE 1.32* 1.25* 1.35* 1.17 1.11  CALCIUM 8.7* 8.2* 8.4* 8.3* 8.3*  MG  --   --  2.0 2.0 2.0  PHOS  --    --  3.9  --   --     GFR: Estimated Creatinine Clearance: 45.2 mL/min (by C-G formula based on SCr of 1.11 mg/dL). Liver Function Tests: Recent Labs  Lab 05/15/21 1259 05/16/21 0656 05/17/21 0450 05/18/21 0257  AST 149* 98* 68* 49*  ALT 48* 41 41 39  ALKPHOS 61 50 46 51  BILITOT 0.7 0.6 0.6 0.6  PROT 5.9* 5.1* 4.7* 4.8*  ALBUMIN 3.2* 2.7* 2.4* 2.4*    No results for input(s): LIPASE, AMYLASE in the last 168 hours. No results for input(s): AMMONIA in the last 168 hours. Coagulation Profile: No results for input(s): INR, PROTIME in the last 168 hours. Cardiac Enzymes: Recent Labs  Lab 05/15/21 2139  CKTOTAL 2,527*    BNP (last 3 results) No results for input(s): PROBNP in the last 8760 hours. HbA1C: No results for input(s): HGBA1C  in the last 72 hours. CBG: No results for input(s): GLUCAP in the last 168 hours. Lipid Profile: No results for input(s): CHOL, HDL, LDLCALC, TRIG, CHOLHDL, LDLDIRECT in the last 72 hours. Thyroid Function Tests: No results for input(s): TSH, T4TOTAL, FREET4, T3FREE, THYROIDAB in the last 72 hours. Anemia Panel: Recent Labs    05/17/21 0450 05/18/21 0257  FERRITIN 92 96    Sepsis Labs: Recent Labs  Lab 05/15/21 0925 05/15/21 1259  PROCALCITON  --  0.10  LATICACIDVEN 1.2 1.2     Recent Results (from the past 240 hour(s))  Resp Panel by RT-PCR (Flu A&B, Covid) Nasopharyngeal Swab     Status: Abnormal   Collection Time: 05/14/21  8:08 PM   Specimen: Nasopharyngeal Swab; Nasopharyngeal(NP) swabs in vial transport medium  Result Value Ref Range Status   SARS Coronavirus 2 by RT PCR POSITIVE (A) NEGATIVE Final    Comment: (NOTE) SARS-CoV-2 target nucleic acids are DETECTED.  The SARS-CoV-2 RNA is generally detectable in upper respiratory specimens during the acute phase of infection. Positive results are indicative of the presence of the identified virus, but do not rule out bacterial infection or co-infection with other  pathogens not detected by the test. Clinical correlation with patient history and other diagnostic information is necessary to determine patient infection status. The expected result is Negative.  Fact Sheet for Patients: EntrepreneurPulse.com.au  Fact Sheet for Healthcare Providers: IncredibleEmployment.be  This test is not yet approved or cleared by the [place] FDA and  has been authorized for detection and/or diagnosis of SARS-CoV-2 by FDA under an Emergency Use Authorization (EUA).  This EUA will remain in effect (meaning this test can be used) for the duration of  the COVID-19 declaration under Section 564(b)(1) of the A ct, 21 U.S.C. section 360bbb-3(b)(1), unless the authorization is terminated or revoked sooner.     Influenza A by PCR NEGATIVE NEGATIVE Final   Influenza B by PCR NEGATIVE NEGATIVE Final    Comment: (NOTE) The Xpert Xpress SARS-CoV-2/FLU/RSV plus assay is intended as an aid in the diagnosis of influenza from Nasopharyngeal swab specimens and should not be used as a sole basis for treatment. Nasal washings and aspirates are unacceptable for Xpert Xpress SARS-CoV-2/FLU/RSV testing.  Fact Sheet for Patients: EntrepreneurPulse.com.au  Fact Sheet for Healthcare Providers: IncredibleEmployment.be  This test is not yet approved or cleared by the [place] FDA and has been authorized for detection and/or diagnosis of SARS-CoV-2 by FDA under an Emergency Use Authorization (EUA). This EUA will remain in effect (meaning this test can be used) for the duration of the COVID-19 declaration under Section 564(b)(1) of the Act, 21 U.S.C. section 360bbb-3(b)(1), unless the authorization is terminated or revoked.  Performed at [facility], [place] [address], [place], [place] [address]   Culture, blood (routine x 2)     Status: None (Preliminary result)   Collection Time: 05/15/21   8:56 AM   Specimen: BLOOD RIGHT HAND  Result Value Ref Range Status   Specimen Description BLOOD RIGHT HAND  Final   Special Requests   Final    BOTTLES DRAWN AEROBIC ONLY Blood Culture results may not be optimal due to an inadequate volume of blood received in culture bottles   Culture   Final    NO GROWTH 3 DAYS Performed at [facility], [place] [address] [place], [place] [address]    Report Status PENDING  Incomplete  Culture, blood (routine x 2)     Status:  None (Preliminary result)   Collection Time: 05/15/21  9:07 AM   Specimen: BLOOD  Result Value Ref Range Status   Specimen Description BLOOD LEFT ANTECUBITAL  Final   Special Requests   Final    BOTTLES DRAWN AEROBIC AND ANAEROBIC Blood Culture results may not be optimal due to an inadequate volume of blood received in culture bottles   Culture   Final    NO GROWTH 3 DAYS Performed at [facility], [place] [address] [place], [place] [address]    Report Status PENDING  Incomplete          Radiology Studies: No results found.      Scheduled Meds:  apixaban  5 mg Oral BID   vitamin C  500 mg Oral Daily   lisinopril  2.5 mg Oral Daily   methylPREDNISolone (SOLU-MEDROL) injection  60 mg Intravenous Daily   sodium chloride flush  3 mL Intravenous Q12H   zinc sulfate  220 mg Oral Daily   Continuous Infusions:  remdesivir 100 mg in NS 100 mL 100 mg (05/18/21 0912)          Aline August, MD [facility] 05/19/2021, 6:43 AM

## 2021-05-20 LAB — CULTURE, BLOOD (ROUTINE X 2)
Culture: NO GROWTH
Culture: NO GROWTH

## 2021-05-20 LAB — COMPREHENSIVE METABOLIC PANEL
ALT: 35 U/L (ref 0–44)
AST: 47 U/L — ABNORMAL HIGH (ref 15–41)
Albumin: 2.5 g/dL — ABNORMAL LOW (ref 3.5–5.0)
Alkaline Phosphatase: 49 U/L (ref 38–126)
Anion gap: 9 (ref 5–15)
BUN: 38 mg/dL — ABNORMAL HIGH (ref 8–23)
CO2: 23 mmol/L (ref 22–32)
Calcium: 8.2 mg/dL — ABNORMAL LOW (ref 8.9–10.3)
Chloride: 108 mmol/L (ref 98–111)
Creatinine, Ser: 1.24 mg/dL (ref 0.61–1.24)
GFR, Estimated: 55 mL/min — ABNORMAL LOW (ref >60)
Glucose, Bld: 104 mg/dL — ABNORMAL HIGH (ref 70–99)
Potassium: 4.2 mmol/L (ref 3.5–5.1)
Sodium: 140 mmol/L (ref 135–145)
Total Bilirubin: 1 mg/dL (ref 0.3–1.2)
Total Protein: 4.5 g/dL — ABNORMAL LOW (ref 6.5–8.1)

## 2021-05-20 LAB — MAGNESIUM: Magnesium: 2.1 mg/dL (ref 1.7–2.4)

## 2021-05-20 LAB — C-REACTIVE PROTEIN: CRP: 3 mg/dL — ABNORMAL HIGH (ref <1.0)

## 2021-05-20 LAB — D-DIMER, QUANTITATIVE: D-Dimer, Quant: 0.51 ug/mL-FEU — ABNORMAL HIGH (ref 0.00–0.50)

## 2021-05-20 MED ORDER — QUETIAPINE FUMARATE 25 MG PO TABS
12.5000 mg | ORAL_TABLET | Freq: Every day | ORAL | Status: DC
  Administered 2021-05-20 – 2021-05-22 (×3): 12.5 mg via ORAL
  Filled 2021-05-20 (×3): qty 1

## 2021-05-20 NOTE — Progress Notes (Signed)
Physical Therapy Treatment Patient Details Name: Henry Franklin. MRN: [id number] DOB: 1926/10/26 Today's Date: 05/20/2021   History of Present Illness 87 y.o. male presents to [facility] hospital on 05/15/2021 after fall and difficulty ambulating at home. Pt found to be COVID+. PMH includes HTN, HLD, schemic cardiomyopathy, CAD, PAF on Eliquis, history of PE in 2015, essential tremor.    PT Comments    Session focused on transfer training and therapeutic exercises for strengthening. Pt requiring two person moderate assist to transfer from recliner back to bed and is unable to ambulate. Pt presents with gross weakness/debility, poor balance, and decreased level of arousal. Continue to recommend SNF for ongoing Physical Therapy.      Recommendations for follow up therapy are one component of a multi-disciplinary discharge planning process, led by the attending physician.  Recommendations may be updated based on patient status, additional functional criteria and insurance authorization.  Follow Up Recommendations  Skilled nursing-short term rehab (<3 hours/day)     Assistance Recommended at Discharge Frequent or constant Supervision/Assistance  Equipment Recommendations  Wheelchair (measurements PT)    Recommendations for Other Services       Precautions / Restrictions Precautions Precautions: Fall Precaution Comments: COVID+ Restrictions Weight Bearing Restrictions: No     Mobility  Bed Mobility Overal bed mobility: Needs Assistance Bed Mobility: Sit to Supine       Sit to supine: Mod assist   General bed mobility comments: Assist for BLE's back into bed    Transfers Overall transfer level: Needs assistance Equipment used: Rolling walker (2 wheels);2 person hand held assist Transfers: Sit to/from Stand;Bed to chair/wheelchair/BSC Sit to Stand: Mod assist;+2 physical assistance Stand pivot transfers: Mod assist;+2 physical assistance         General transfer comment:  Heavy modA + 2 to stand from recliner up to walker, heavy posterior lean and difficult weight shifting forward. Trialed again with no AD and handheld assist to pivot over from bed to chair    Ambulation/Gait                   Stairs             Wheelchair Mobility    Modified Rankin (Stroke Patients Only)       Balance Overall balance assessment: Needs assistance Sitting-balance support: Feet supported;Single extremity supported Sitting balance-Leahy Scale: Poor     Standing balance support: Bilateral upper extremity supported Standing balance-Leahy Scale: Poor                              Cognition Arousal/Alertness: Awake/alert Behavior During Therapy: WFL for tasks assessed/performed Overall Cognitive Status: Impaired/Different from baseline Area of Impairment: Attention;Memory;Following commands;Safety/judgement;Awareness;Problem solving                   Current Attention Level: Sustained Memory: Decreased short-term memory Following Commands: Follows one step commands consistently;Follows multi-step commands inconsistently Safety/Judgement: Decreased awareness of deficits Awareness: Intellectual Problem Solving: Slow processing;Decreased initiation;Requires verbal cues;Requires tactile cues;Difficulty sequencing General Comments: Pt needs multimodal cues to keep eyes open and for command following. Difficulty verbalizing basic needs. Pt son reports this is not his baseline        Exercises General Exercises - Upper Extremity Shoulder Flexion: AAROM;Both;10 reps;Supine Elbow Flexion: AAROM;Both;10 reps;Supine Elbow Extension: Both;10 reps;Supine General Exercises - Lower Extremity Long Arc Quad: AAROM;Both;5 reps;Seated Heel Slides: AAROM;Both;10 reps;Supine Straight Leg Raises: AAROM;Both;10 reps;Supine    General Comments  Pertinent Vitals/Pain Pain Assessment: Faces Faces Pain Scale: Hurts little more Pain  Location: generalized Pain Descriptors / Indicators: Grimacing Pain Intervention(s): Monitored during session    Home Living                          Prior Function            PT Goals (current goals can now be found in the care plan section) Acute Rehab PT Goals Patient Stated Goal: to improve strength and energy Potential to Achieve Goals: Fair    Frequency    Min 2X/week      PT Plan Current plan remains appropriate    Co-evaluation              AM-PAC PT "6 Clicks" Mobility   Outcome Measure  Help needed turning from your back to your side while in a flat bed without using bedrails?: A Lot Help needed moving from lying on your back to sitting on the side of a flat bed without using bedrails?: A Lot Help needed moving to and from a bed to a chair (including a wheelchair)?: Total Help needed standing up from a chair using your arms (e.g., wheelchair or bedside chair)?: Total Help needed to walk in hospital room?: Total Help needed climbing 3-5 steps with a railing? : Total 6 Click Score: 8    End of Session Equipment Utilized During Treatment: Gait belt Activity Tolerance: Patient limited by fatigue Patient left: in bed;with call bell/phone within reach;with bed alarm set Nurse Communication: Mobility status PT Visit Diagnosis: Other abnormalities of gait and mobility (R26.89);Muscle weakness (generalized) (M62.81);History of falling (Z91.81)     Time: 0712-1975 PT Time Calculation (min) (ACUTE ONLY): 33 min  Charges:  $Therapeutic Exercise: 8-22 mins $Therapeutic Activity: 8-22 mins                     Wyona Almas, PT, DPT Acute Rehabilitation Services Pager [phone] Office [phone]    Deno Etienne 05/20/2021, 2:37 PM

## 2021-05-20 NOTE — Progress Notes (Signed)
Pt was picking at medical equipment, prn haldol given with positive results. Louanne Skye 05/20/21 2:30 AM

## 2021-05-20 NOTE — Progress Notes (Signed)
Patient ID: Henry Buss., male   DOB: 1926-08-22, 87 y.o.   MRN: [id number]  PROGRESS NOTE    Henry Cotta.  BPZ:[id number] DOB: 1926/10/01 DOA: 05/14/2021 PCP: [name], MD   Brief Narrative:  87 y.o. male with medical history significant of hypertension, hyperlipidemia, ischemic cardiomyopathy, CAD, PAF on Eliquis, history of PE in 2015, essential tremor, and reports of dementia presented with worsening weakness and fall.  Patient was found to be COVID-positive at home.  In route with EMS, patient was febrile to 101.  On presentation to the ED, he was tachypneic with creatinine of 1.32 and lactic acid of 1.2.  Chest x-ray showed mild bibasilar airspace disease greater on the left than the right, left lower lung pneumonia could not be excluded.  CT of the head and cervical spine was negative for any acute abnormalities.  COVID-19 testing was positive.  He was started on IV remdesivir and subsequently on Solu-Medrol as well.  PT recommended SNF placement.  He has had intermittent episodes of agitation requiring IV Haldol in the hospital.  Assessment & Plan:   COVID-19 pneumonia Generalized weakness and fall secondary to above -Presented with worsening generalized weakness and fall.  CT of the head and neck negative for any acute abnormality. -Chest x-ray: Could not rule out left lower lobe pneumonia. -Currently on room air.  Completed 5 days of IV remdesivir.  Continue Solu-Medrol for total of 10 days.  Monitor daily inflammatory markers.   COVID-19 Labs  Recent Labs    05/18/21 0257 05/19/21 0751 05/20/21 0237  DDIMER 0.67* 0.61* 0.51*  FERRITIN 96  --   --   CRP 6.4* 3.2* 3.0*     Lab Results  Component Value Date   SARSCOV2NAA POSITIVE (A) 05/14/2021   -Continue inhalers as needed and supplemental oxygen as needed. -Follow cultures.  Doubt that patient has bacterial infection.  Procalcitonin on presentation was 0.1.  Acute kidney injury -Creatinine 1.32 on  presentation; 1.06 on 10/05/2020.  Treated with IV fluids.  Improving to 1.24 today.  Encourage oral intake.  Paroxysmal A. fib on chronic anticoagulation -Rate controlled.  Continue Eliquis.  Outpatient follow-up with cardiology  Hypertensive urgency -Blood pressure improving.  Continue lisinopril.  History of PE in 2015 -Continue Eliquis  Hyperlipidemia -Statin on hold due to elevated liver enzymes  Elevated LFTs -Questionable cause.  Improving.  Monitor   Elevated CK -Possibly from fall and mild rhabdomyolysis.    Memory difficulties Intermittent agitation/delirium -Patient's son reported that his father likely had some dementia -Delirium precautions.  Fall precautions.  PT eval. -After discussion with patient's son on 05/16/2021, son decided to change his father's CODE STATUS to DNR. -Palliative care consultation for goals of care discussion -Patient has had issues with intermittent agitation requiring intermittent Haldol.  We will start low-dose Seroquel at night.  Spinal stenosis -At baseline, patient walks with the use of walker and has a history of severe spinal stenosis previously documented by MRI. -PT recommended SNF placement.  Social worker following.  DVT prophylaxis: Eliquis Code Status: Full Family Communication: Son on phone on 05/18/2021 disposition Plan: Status is: inpatient because: need for nursing home placement  Consultants: palliative care  Procedures: None  Antimicrobials:  Anti-infectives (From admission, onward)    Start     Dose/Rate Route Frequency Ordered Stop   05/16/21 1000  remdesivir 100 mg in sodium chloride 0.9 % 100 mL IVPB       See Hyperspace for full Linked  Orders Report.   100 mg 200 mL/hr over 30 Minutes Intravenous Daily 05/15/21 1035 05/19/21 0953   05/15/21 1045  remdesivir 200 mg in sodium chloride 0.9% 250 mL IVPB       See Hyperspace for full Linked Orders Report.   200 mg 580 mL/hr over 30 Minutes Intravenous Once  05/15/21 1035 05/15/21 1635        Subjective: Patient seen and examined at bedside.  Poor historian.  Nursing staff reported overnight agitation requiring Haldol.  No fever, worsening shortness of breath reported.   Objective: Vitals:   05/18/21 1632 05/18/21 2025 05/19/21 0424 05/19/21 0929  BP: (!) 147/74 106/87 128/79 117/78  Pulse: (!) 58 99 90 100  Resp: 20 20 18 18   Temp: 98.2 F (36.8 C) 98 F (36.7 C) 98 F (36.7 C) 98 F (36.7 C)  TempSrc:  Oral    SpO2: 97% 92% 95%   Weight:      Height:        Intake/Output Summary (Last 24 hours) at 05/20/2021 0750 Last data filed at 05/19/2021 1800 Gross per 24 hour  Intake 490 ml  Output 400 ml  Net 90 ml    Filed Weights   05/14/21 1955  Weight: 83 kg    Examination:  General exam: Currently on room air.  No acute distress.  Elderly male lying in bed.  Hard of hearing. Respiratory system: Bilateral decreased breath sounds at bases with scattered crackles  cardiovascular system: S1-S2 heard; mildly bradycardic intermittently gastrointestinal system: Abdomen is slightly distended; soft and nontender.  Normal bowel sounds heard extremities: No cyanosis; trace lower extremity edema present Central nervous system: Very slow to respond; wakes up slightly.  Poor historian.  No focal neurological deficits.  Moving extremities skin: No obvious petechiae/rashes  psychiatry: Cannot be assessed because of mental status.  Currently not agitated.  Data Reviewed: I have personally reviewed following labs and imaging studies  CBC: Recent Labs  Lab 05/14/21 2016 05/16/21 0656 05/17/21 0450 05/18/21 0257  WBC 7.3 9.9 10.6* 9.2  NEUTROABS  --  7.9* 8.9* 8.0*  HGB 16.0 15.8 15.4 16.0  HCT 48.1 48.3 46.1 47.2  MCV 94.3 94.3 92.0 92.2  PLT 162 155 158 916    Basic Metabolic Panel: Recent Labs  Lab 05/16/21 0656 05/17/21 0450 05/18/21 0257 05/19/21 0751 05/20/21 0237  NA 139 138 137 141 140  K 4.3 3.7 3.8 3.7 4.2   CL 105 105 105 106 108  CO2 24 25 28 27 23   GLUCOSE 120* 139* 143* 105* 104*  BUN 32* 45* 43* 39* 38*  CREATININE 1.35* 1.17 1.11 1.17 1.24  CALCIUM 8.4* 8.3* 8.3* 8.7* 8.2*  MG 2.0 2.0 2.0 2.3 2.1  PHOS 3.9  --   --   --   --     GFR: Estimated Creatinine Clearance: 40.5 mL/min (by C-G formula based on SCr of 1.24 mg/dL). Liver Function Tests: Recent Labs  Lab 05/16/21 0656 05/17/21 0450 05/18/21 0257 05/19/21 0751 05/20/21 0237  AST 98* 68* 49* 48* 47*  ALT 41 41 39 42 35  ALKPHOS 50 46 51 57 49  BILITOT 0.6 0.6 0.6 1.2 1.0  PROT 5.1* 4.7* 4.8* 5.4* 4.5*  ALBUMIN 2.7* 2.4* 2.4* 2.8* 2.5*    No results for input(s): LIPASE, AMYLASE in the last 168 hours. No results for input(s): AMMONIA in the last 168 hours. Coagulation Profile: No results for input(s): INR, PROTIME in the last 168 hours. Cardiac Enzymes: Recent  Labs  Lab 05/15/21 2139  CKTOTAL 2,527*    BNP (last 3 results) No results for input(s): PROBNP in the last 8760 hours. HbA1C: No results for input(s): HGBA1C in the last 72 hours. CBG: No results for input(s): GLUCAP in the last 168 hours. Lipid Profile: No results for input(s): CHOL, HDL, LDLCALC, TRIG, CHOLHDL, LDLDIRECT in the last 72 hours. Thyroid Function Tests: No results for input(s): TSH, T4TOTAL, FREET4, T3FREE, THYROIDAB in the last 72 hours. Anemia Panel: Recent Labs    05/18/21 0257  FERRITIN 96    Sepsis Labs: Recent Labs  Lab 05/15/21 0925 05/15/21 1259  PROCALCITON  --  0.10  LATICACIDVEN 1.2 1.2     Recent Results (from the past 240 hour(s))  Resp Panel by RT-PCR (Flu A&B, Covid) Nasopharyngeal Swab     Status: Abnormal   Collection Time: 05/14/21  8:08 PM   Specimen: Nasopharyngeal Swab; Nasopharyngeal(NP) swabs in vial transport medium  Result Value Ref Range Status   SARS Coronavirus 2 by RT PCR POSITIVE (A) NEGATIVE Final    Comment: (NOTE) SARS-CoV-2 target nucleic acids are DETECTED.  The SARS-CoV-2 RNA is  generally detectable in upper respiratory specimens during the acute phase of infection. Positive results are indicative of the presence of the identified virus, but do not rule out bacterial infection or co-infection with other pathogens not detected by the test. Clinical correlation with patient history and other diagnostic information is necessary to determine patient infection status. The expected result is Negative.  Fact Sheet for Patients: EntrepreneurPulse.com.au  Fact Sheet for Healthcare Providers: IncredibleEmployment.be  This test is not yet approved or cleared by the [place] FDA and  has been authorized for detection and/or diagnosis of SARS-CoV-2 by FDA under an Emergency Use Authorization (EUA).  This EUA will remain in effect (meaning this test can be used) for the duration of  the COVID-19 declaration under Section 564(b)(1) of the A ct, 21 U.S.C. section 360bbb-3(b)(1), unless the authorization is terminated or revoked sooner.     Influenza A by PCR NEGATIVE NEGATIVE Final   Influenza B by PCR NEGATIVE NEGATIVE Final    Comment: (NOTE) The Xpert Xpress SARS-CoV-2/FLU/RSV plus assay is intended as an aid in the diagnosis of influenza from Nasopharyngeal swab specimens and should not be used as a sole basis for treatment. Nasal washings and aspirates are unacceptable for Xpert Xpress SARS-CoV-2/FLU/RSV testing.  Fact Sheet for Patients: EntrepreneurPulse.com.au  Fact Sheet for Healthcare Providers: IncredibleEmployment.be  This test is not yet approved or cleared by the [place] FDA and has been authorized for detection and/or diagnosis of SARS-CoV-2 by FDA under an Emergency Use Authorization (EUA). This EUA will remain in effect (meaning this test can be used) for the duration of the COVID-19 declaration under Section 564(b)(1) of the Act, 21 U.S.C. section 360bbb-3(b)(1),  unless the authorization is terminated or revoked.  Performed at [facility], [place] [address] [place], [place] [address]   Culture, blood (routine x 2)     Status: None (Preliminary result)   Collection Time: 05/15/21  8:56 AM   Specimen: BLOOD RIGHT HAND  Result Value Ref Range Status   Specimen Description BLOOD RIGHT HAND  Final   Special Requests   Final    BOTTLES DRAWN AEROBIC ONLY Blood Culture results may not be optimal due to an inadequate volume of blood received in culture bottles   Culture   Final    NO GROWTH 4 DAYS Performed at [facility], [place] [address], [place], [place] [address]    Report Status PENDING  Incomplete  Culture, blood (routine x 2)     Status: None (Preliminary result)   Collection Time: 05/15/21  9:07 AM   Specimen: BLOOD  Result Value Ref Range Status   Specimen Description BLOOD LEFT ANTECUBITAL  Final   Special Requests   Final    BOTTLES DRAWN AEROBIC AND ANAEROBIC Blood Culture results may not be optimal due to an inadequate volume of blood received in culture bottles   Culture   Final    NO GROWTH 4 DAYS Performed at [facility], [place] [address] [place], [place] [address]    Report Status PENDING  Incomplete          Radiology Studies: No results found.      Scheduled Meds:  apixaban  5 mg Oral BID   vitamin C  500 mg Oral Daily   lisinopril  2.5 mg Oral Daily   methylPREDNISolone (SOLU-MEDROL) injection  40 mg Intravenous Daily   sodium chloride flush  3 mL Intravenous Q12H   zinc sulfate  220 mg Oral Daily   Continuous Infusions:          Aline August, MD [facility] 05/20/2021, 7:50 AM

## 2021-05-20 NOTE — Plan of Care (Signed)
  Problem: Clinical Measurements: Goal: Respiratory complications will improve Outcome: Progressing   Problem: Activity: Goal: Risk for activity intolerance will decrease Outcome: Progressing   Problem: Nutrition: Goal: Adequate nutrition will be maintained Outcome: Progressing   Problem: Safety: Goal: Ability to remain free from injury will improve Outcome: Progressing   

## 2021-05-20 NOTE — TOC Progression Note (Signed)
Transition of Care (TOC) - Initial/Assessment Note    Patient Details  Name: Henry Franklin. MRN: [id number] Date of Birth: 1927-04-23  Transition of [facility]) CM/SW Contact:    Milinda Antis, LCSWA Phone Number: 05/20/2021, 10:00 AM  Clinical Narrative:                 CSW spoke with the patient's son, Abdulwahab Demelo, on this day.  The patient's son reports that he and his wife  live close to the patient, but both work full time and have children.  The patient lives with spouse, but the spouse if frail and would not be able to assist with transfers.  CSW informed the son that the patient has denied SNF and want to go home.  The son informed CSW that he would speak with his mother, spouse, and sister to discuss next steps of SNF v/s HH and will have an answer before the end of the day.    Expected Discharge Plan: Skilled Nursing Facility Barriers to Discharge: Continued Medical Work up, Other (must enter comment) (Patient refusing SNF, verifying natural supports)   Patient Goals and CMS Choice Patient states their goals for this hospitalization and ongoing recovery are:: To go home CMS Medicare.gov Compare Post Acute Care list provided to:: Patient Choice offered to / list presented to : Patient  Expected Discharge Plan and Services Expected Discharge Plan: [place] arrangements for the past 2 months: Single Family Home                                      Prior Living Arrangements/Services Living arrangements for the past 2 months: Single Family Home Lives with:: Spouse Patient language and need for interpreter reviewed:: Yes Do you feel safe going back to the place where you live?: Yes      Need for Family Participation in Patient Care: Yes (Comment) Care giver support system in place?: Yes (comment)   Criminal Activity/Legal Involvement Pertinent to Current Situation/Hospitalization: No - Comment as needed  Activities of Daily  Living Home Assistive Devices/Equipment: Other (Comment) ADL Screening (condition at time of admission) Patient's cognitive ability adequate to safely complete daily activities?: No Is the patient deaf or have difficulty hearing?: No Does the patient have difficulty seeing, even when wearing glasses/contacts?: No Does the patient have difficulty concentrating, remembering, or making decisions?: Yes Patient able to express need for assistance with ADLs?: No Does the patient have difficulty dressing or bathing?: Yes Independently performs ADLs?: No Does the patient have difficulty walking or climbing stairs?: Yes Weakness of Legs: Both Weakness of Arms/Hands: Both  Permission Sought/Granted Permission sought to share information with : Facility Sport and exercise psychologist, Family Supports Permission granted to share information with : Yes, Verbal Permission Granted              Emotional Assessment   Attitude/Demeanor/Rapport: Engaged   Orientation: : Oriented to Self, Oriented to Place, Oriented to  Time, Oriented to Situation Alcohol / Substance Use: Not Applicable Psych Involvement: No (comment)  Admission diagnosis:  Weakness [R53.1] Acute cough [R05.1] COVID-19 [U07.1] Patient Active Problem List   Diagnosis Date Noted   COVID-19 05/15/2021   Hypertensive urgency 05/15/2021   Fever 05/15/2021   Renal insufficiency 05/15/2021   History of pulmonary embolism 05/15/2021   Bilateral leg edema 04/05/2021   Prediabetes 04/04/2021  Rash 11/11/2020   Allergic rhinitis 11/08/2020   Impaired glucose tolerance 11/08/2020   Pain due to onychomycosis of toenails of both feet 02/14/2020   Impacted cerumen, bilateral 09/14/2019   Unspecified hearing loss, unspecified ear 09/14/2019   Memory difficulties 08/24/2019   Frequent falls 08/11/2018   Poor balance 08/11/2018   Physical deconditioning 08/11/2018   Neck pain 02/24/2017   Cervical paraspinal muscle spasm 12/23/2016   CAD  (coronary artery disease) 12/03/2015   Atrial fibrillation ([place]) 11/21/2015   STEMI (ST elevation myocardial infarction) ([place]) 11/19/2015   Personal history of transient ischemic attack (TIA), and cerebral infarction without residual deficits 05/31/2014   Pulmonary embolism ([place]) 05/20/2014   Prostate cancer ([place]) 04/05/2014   Lumbar spondylosis 11/15/2013   Fall at home, initial encounter 05/10/2013   Hyperlipidemia    Benign essential tremor    Lumbago    Palpitations    PCP:  [name], MD Pharmacy:   [id number] [place], [place] [address] [place] [address] Phone: [phone] Fax: [phone]  [id number] [place], [place] [address] [place] [address] Phone: [phone] Fax: [phone]     Social Determinants of Health (SDOH) Interventions    Readmission Risk Interventions No flowsheet data found.

## 2021-05-20 NOTE — TOC Progression Note (Signed)
Transition of Care (TOC) - Initial/Assessment Note    Patient Details  Name: Henry Franklin. MRN: [id number] Date of Birth: [birth year]  [facility]) CM/SW Contact:    Milinda Antis, [place] Phone Number: 05/20/2021, 1:06 PM  Clinical Narrative:                 RE: Rutilio, [place] Date of Birth: December 03, 1926 Date: 05/20/2021  Please be advised that the above-named patient has a primary diagnosis of dementia which supersedes any psychiatric diagnosis. Patient will require a short-term nursing home stay - anticipated 30 days or less for rehabilitation and strengthening.  The plan is for return home.    Expected Discharge Plan: Skilled Nursing Facility Barriers to Discharge: Continued Medical Work up, Other (must enter comment) (Patient refusing SNF, verifying natural supports)   Patient Goals and CMS Choice Patient states their goals for this hospitalization and ongoing recovery are:: To go home CMS Medicare.gov Compare Post Acute Care list provided to:: Patient Choice offered to / list presented to : Patient  Expected Discharge Plan and Services Expected Discharge Plan: Power arrangements for the past 2 months: Single Family Home                                      Prior Living Arrangements/Services Living arrangements for the past 2 months: Single Family Home Lives with:: Spouse Patient language and need for interpreter reviewed:: Yes Do you feel safe going back to the place where you live?: Yes      Need for Family Participation in Patient Care: Yes (Comment) Care giver support system in place?: Yes (comment)   Criminal Activity/Legal Involvement Pertinent to Current Situation/Hospitalization: No - Comment as needed  Activities of Daily Living Home Assistive Devices/Equipment: Other (Comment) ADL Screening (condition at time of admission) Patient's cognitive ability adequate to safely complete daily activities?:  No Is the patient deaf or have difficulty hearing?: No Does the patient have difficulty seeing, even when wearing glasses/contacts?: No Does the patient have difficulty concentrating, remembering, or making decisions?: Yes Patient able to express need for assistance with ADLs?: No Does the patient have difficulty dressing or bathing?: Yes Independently performs ADLs?: No Does the patient have difficulty walking or climbing stairs?: Yes Weakness of Legs: Both Weakness of Arms/Hands: Both  Permission Sought/Granted Permission sought to share information with : Facility Sport and exercise psychologist, Family Supports Permission granted to share information with : Yes, Verbal Permission Granted              Emotional Assessment   Attitude/Demeanor/Rapport: Engaged   Orientation: : Oriented to Self, Oriented to Place, Oriented to  Time, Oriented to Situation Alcohol / Substance Use: Not Applicable Psych Involvement: No (comment)  Admission diagnosis:  Weakness [R53.1] Acute cough [R05.1] COVID-19 [U07.1] Patient Active Problem List   Diagnosis Date Noted   COVID-19 05/15/2021   Hypertensive urgency 05/15/2021   Fever 05/15/2021   Renal insufficiency 05/15/2021   History of pulmonary embolism 05/15/2021   Bilateral leg edema 04/05/2021   Prediabetes 04/04/2021   Rash 11/11/2020   Allergic rhinitis 11/08/2020   Impaired glucose tolerance 11/08/2020   Pain due to onychomycosis of toenails of both feet 02/14/2020   Impacted cerumen, bilateral 09/14/2019   Unspecified hearing loss, unspecified ear 09/14/2019   Memory difficulties 08/24/2019   Frequent falls 08/11/2018  Poor balance 08/11/2018   Physical deconditioning 08/11/2018   Neck pain 02/24/2017   Cervical paraspinal muscle spasm 12/23/2016   CAD (coronary artery disease) 12/03/2015   Atrial fibrillation ([place]) 11/21/2015   STEMI (ST elevation myocardial infarction) ([place]) 11/19/2015   Personal history of transient ischemic  attack (TIA), and cerebral infarction without residual deficits 05/31/2014   Pulmonary embolism ([place]) 05/20/2014   Prostate cancer ([place]) 04/05/2014   Lumbar spondylosis 11/15/2013   Fall at home, initial encounter 05/10/2013   Hyperlipidemia    Benign essential tremor    Lumbago    Palpitations    PCP:  [name], MD Pharmacy:   [id number] [place], [place] [address]  [place] [address] Phone: [phone] Fax: [phone]  [id number] [address] [place], [place] [address] [place] [address] Phone: [phone] Fax: [phone]     Social Determinants of Health (SDOH) Interventions    Readmission Risk Interventions No flowsheet data found.

## 2021-05-20 NOTE — NC FL2 (Signed)
[place] LEVEL OF CARE SCREENING TOOL     IDENTIFICATION  Patient Name: Henry Franklin. Birthdate: 1926/10/14 Sex: male Admission Date (Current Location): 05/14/2021  [facility] and [place] Number:  Herbalist and Address:  [facility] [place]: [place] [address], [place] [address], [place], [place] [address]      Provider Number: [id number]  Attending Physician Name and Address:  Aline August, MD  Relative Name and Phone Number:  Jereld, Presti     [phone]    Current Level of Care: Hospital Recommended Level of Care: [place] Prior Approval Number:    Date Approved/Denied:   PASRR Number: pending  Discharge Plan: SNF    Current Diagnoses: Patient Active Problem List   Diagnosis Date Noted   COVID-19 05/15/2021   Hypertensive urgency 05/15/2021   Fever 05/15/2021   Renal insufficiency 05/15/2021   History of pulmonary embolism 05/15/2021   Bilateral leg edema 04/05/2021   Prediabetes 04/04/2021   Rash 11/11/2020   Allergic rhinitis 11/08/2020   Impaired glucose tolerance 11/08/2020   Pain due to onychomycosis of toenails of both feet 02/14/2020   Impacted cerumen, bilateral 09/14/2019   Unspecified hearing loss, unspecified ear 09/14/2019   Memory difficulties 08/24/2019   Frequent falls 08/11/2018   Poor balance 08/11/2018   Physical deconditioning 08/11/2018   Neck pain 02/24/2017   Cervical paraspinal muscle spasm 12/23/2016   CAD (coronary artery disease) 12/03/2015   Atrial fibrillation ([place]) 11/21/2015   STEMI (ST elevation myocardial infarction) ([place]) 11/19/2015   Personal history of transient ischemic attack (TIA), and cerebral infarction without residual deficits 05/31/2014   Pulmonary embolism ([place]) 05/20/2014   Prostate cancer ([place]) 04/05/2014   Lumbar spondylosis 11/15/2013   Fall at home, initial encounter 05/10/2013   Hyperlipidemia    Benign essential tremor    Lumbago    Palpitations      Orientation RESPIRATION BLADDER Height & Weight     Self  Normal Incontinent, External catheter Weight: 183 lb (83 kg) Height:  5\' 11"  (180.3 cm)  BEHAVIORAL SYMPTOMS/MOOD NEUROLOGICAL BOWEL NUTRITION STATUS      Continent Diet (see d/c summary)  AMBULATORY STATUS COMMUNICATION OF NEEDS Skin   Extensive Assist Verbally PU Stage and Appropriate Care (sacrum)                       Personal Care Assistance Level of Assistance  Feeding, Dressing, Bathing Bathing Assistance: Limited assistance Feeding assistance: Independent Dressing Assistance: Limited assistance     Functional Limitations Info  Hearing, Speech, Sight   Hearing Info: Adequate Speech Info: Adequate    SPECIAL CARE FACTORS FREQUENCY  PT (By licensed PT), OT (By licensed OT)     PT Frequency: 5x/ week OT Frequency: 5x/ week            Contractures Contractures Info: Not present    Additional Factors Info  Code Status, Allergies, Isolation Precautions Code Status Info: DNR Allergies Info: NKA     Isolation Precautions Info: Air/ Con pre     Current Medications (05/20/2021):  This is the current hospital active medication list Current Facility-Administered Medications  Medication Dose Route Frequency Provider Last Rate Last Admin   albuterol (VENTOLIN HFA) 108 (90 Base) MCG/ACT inhaler 2 puff  2 puff Inhalation Q6H PRN Starla Link, Kshitiz, MD       apixaban (ELIQUIS) tablet 5 mg  5 mg Oral BID Fuller Plan A, MD   5 mg at 05/20/21  0900   ascorbic acid (VITAMIN C) tablet 500 mg  500 mg Oral Daily Tamala Julian, Rondell A, MD   500 mg at 05/20/21 0859   benzocaine (ORAJEL) 10 % mucosal gel   Mouth/Throat BID PRN Etta Quill, DO       chlorpheniramine-HYDROcodone (TUSSIONEX) 10-8 MG/5ML suspension 5 mL  5 mL Oral Q12H PRN Fuller Plan A, MD   5 mL at 05/15/21 1437   guaiFENesin-dextromethorphan (ROBITUSSIN DM) 100-10 MG/5ML syrup 10 mL  10 mL Oral Q4H PRN Fuller Plan A, MD   10 mL at 05/16/21 1612    haloperidol lactate (HALDOL) injection 2-4 mg  2-4 mg Intravenous Q6H PRN Etta Quill, DO   4 mg at 05/19/21 2222   hydrALAZINE (APRESOLINE) injection 5 mg  5 mg Intravenous Q4H PRN Fuller Plan A, MD       lisinopril (ZESTRIL) tablet 2.5 mg  2.5 mg Oral Daily Smith, Rondell A, MD   2.5 mg at 05/20/21 0900   methylPREDNISolone sodium succinate (SOLU-MEDROL) 40 mg/mL injection 40 mg  40 mg Intravenous Daily Alekh, Kshitiz, MD   40 mg at 05/20/21 0859   QUEtiapine (SEROQUEL) tablet 12.5 mg  12.5 mg Oral QHS Alekh, Kshitiz, MD       sodium chloride flush (NS) 0.9 % injection 3 mL  3 mL Intravenous Q12H Smith, Rondell A, MD   3 mL at 05/20/21 0900   zinc sulfate capsule 220 mg  220 mg Oral Daily Fuller Plan A, MD   220 mg at 05/20/21 9563     Discharge Medications: Please see discharge summary for a list of discharge medications.  Relevant Imaging Results:  Relevant Lab Results:   Additional Information Lind Covert, MSW, LCSWA   [id number] [phone];   PFIZER Comrnaty(Gray TOP) Covid-19 Vaccine 09/05/2020  Pfizer COVID-19 Vaccine 09/05/2020 , 03/02/2020 , 07/12/2019 , 06/21/2019  Pfizer Covid-19 Vaccine Bivalent Booster 04/02/2021  Milinda Antis, LCSWA

## 2021-05-21 MED ORDER — OXYCODONE-ACETAMINOPHEN 5-325 MG PO TABS
1.0000 | ORAL_TABLET | Freq: Four times a day (QID) | ORAL | Status: DC | PRN
  Administered 2021-05-21 – 2021-05-22 (×4): 2 via ORAL
  Administered 2021-05-22: 1 via ORAL
  Filled 2021-05-21: qty 1
  Filled 2021-05-21 (×4): qty 2

## 2021-05-21 MED ORDER — FOOD THICKENER (SIMPLYTHICK): 1.0000 | ORAL | Status: DC | PRN

## 2021-05-21 NOTE — Plan of Care (Signed)
  Problem: Activity: Goal: Risk for activity intolerance will decrease Outcome: Not Progressing   

## 2021-05-21 NOTE — Progress Notes (Signed)
Patient ID: Henry Sanderson., male   DOB: 02-03-1927, 87 y.o.   MRN: [id number]  PROGRESS NOTE    Henry Franklin.  JAS:[id number] DOB: 1926/08/09 DOA: 05/14/2021 PCP: [name], MD   Brief Narrative:  87 y.o. male with medical history significant of hypertension, hyperlipidemia, ischemic cardiomyopathy, CAD, PAF on Eliquis, history of PE in 2015, essential tremor, and reports of dementia presented with worsening weakness and fall.  Patient was found to be COVID-positive at home.  In route with EMS, patient was febrile to 101.  On presentation to the ED, he was tachypneic with creatinine of 1.32 and lactic acid of 1.2.  Chest x-ray showed mild bibasilar airspace disease greater on the left than the right, left lower lung pneumonia could not be excluded.  CT of the head and cervical spine was negative for any acute abnormalities.  COVID-19 testing was positive.  He was started on IV remdesivir and subsequently on Solu-Medrol as well.  PT recommended SNF placement.  He has had intermittent episodes of agitation requiring IV Haldol in the hospital.  Assessment & Plan:   COVID-19 pneumonia Generalized weakness and fall secondary to above -Presented with worsening generalized weakness and fall.  CT of the head and neck negative for any acute abnormality. -Chest x-ray: Could not rule out left lower lobe pneumonia. -Currently on room air.  Completed 5 days of IV remdesivir.  Continue Solu-Medrol for total of 10 days.  Monitor daily inflammatory markers.   COVID-19 Labs  Recent Labs    05/19/21 0751 05/20/21 0237  DDIMER 0.61* 0.51*  CRP 3.2* 3.0*    Lab Results  Component Value Date   SARSCOV2NAA POSITIVE (A) 05/14/2021     -Continue inhalers as needed and supplemental oxygen as needed. -Follow cultures.  Doubt that patient has bacterial infection.  Procalcitonin on presentation was 0.1.  Acute kidney injury -Creatinine 1.32 on presentation; 1.06 on 10/05/2020.  Treated with IV fluids.   Creatinine pending for today.  Encourage oral intake.  Paroxysmal A. fib on chronic anticoagulation -Rate controlled.  Continue Eliquis.  Outpatient follow-up with cardiology  Hypertensive urgency -Blood pressure improving.  Continue lisinopril.  History of PE in 2015 -Continue Eliquis  Hyperlipidemia -Statin on hold due to elevated liver enzymes  Elevated LFTs -Questionable cause.  Improving.  Monitor   Elevated CK -Possibly from fall and mild rhabdomyolysis.    Memory difficulties Intermittent agitation/delirium -Patient's son reported that his father likely had some dementia -Delirium precautions.  Fall precautions.  PT eval. -After discussion with patient's son on 05/16/2021, son decided to change his father's CODE STATUS to DNR. -Palliative care consultation for goals of care discussion -Patient has had issues with intermittent agitation requiring intermittent Haldol.  Low-dose Seroquel started on 05/20/2021  Spinal stenosis -At baseline, patient walks with the use of walker and has a history of severe spinal stenosis previously documented by MRI. -PT recommended SNF placement.  Social worker following.  DVT prophylaxis: Eliquis Code Status: Full Family Communication: Son on phone on 05/18/2021 disposition Plan: Status is: inpatient because: need for nursing home placement  Consultants: palliative care  Procedures: None  Antimicrobials:  Anti-infectives (From admission, onward)    Start     Dose/Rate Route Frequency Ordered Stop   05/16/21 1000  remdesivir 100 mg in sodium chloride 0.9 % 100 mL IVPB       See Hyperspace for full Linked Orders Report.   100 mg 200 mL/hr over 30 Minutes Intravenous Daily 05/15/21  1035 05/19/21 0953   05/15/21 1045  remdesivir 200 mg in sodium chloride 0.9% 250 mL IVPB       See Hyperspace for full Linked Orders Report.   200 mg 580 mL/hr over 30 Minutes Intravenous Once 05/15/21 1035 05/15/21 1635         Subjective: Patient seen and examined at bedside.  Poor historian.  No fever, vomiting, chest pain reported.   Objective: Vitals:   05/20/21 0812 05/20/21 1713 05/20/21 2046 05/21/21 0428  BP: 138/78 (!) 188/103 123/85 (!) 151/72  Pulse: 86 79 80 96  Resp: 17 16 18 17   Temp: (!) 97.4 F (36.3 C) 97.7 F (36.5 C) 97.8 F (36.6 C) 98 F (36.7 C)  TempSrc: Oral Oral Oral Axillary  SpO2: 96% 95% 95% 96%  Weight:      Height:        Intake/Output Summary (Last 24 hours) at 05/21/2021 0812 Last data filed at 05/21/2021 0429 Gross per 24 hour  Intake 600 ml  Output 200 ml  Net 400 ml    Filed Weights   05/14/21 1955  Weight: 83 kg    Examination:  General exam: No distress.  On room air currently.  Elderly male lying in bed.  Hard of hearing. Respiratory system: Decreased breath sounds at bases bilaterally with some crackles  cardiovascular system: Currently rate controlled; S1-S2 heard gastrointestinal system: Abdomen is distended slightly; soft and nontender.  Bowel sounds are heard extremities: Mild lower extremity edema present; no clubbing  Central nervous system: Sleepy, wakes up slightly, slow to respond.  Slightly confused.  Poor historian.  No focal neurological deficits.  Moves extremities skin: No obvious ecchymosis/lesions psychiatry: Currently no signs of agitation.  Affect is flat.  Data Reviewed: I have personally reviewed following labs and imaging studies  CBC: Recent Labs  Lab 05/14/21 2016 05/16/21 0656 05/17/21 0450 05/18/21 0257  WBC 7.3 9.9 10.6* 9.2  NEUTROABS  --  7.9* 8.9* 8.0*  HGB 16.0 15.8 15.4 16.0  HCT 48.1 48.3 46.1 47.2  MCV 94.3 94.3 92.0 92.2  PLT 162 155 158 244    Basic Metabolic Panel: Recent Labs  Lab 05/16/21 0656 05/17/21 0450 05/18/21 0257 05/19/21 0751 05/20/21 0237  NA 139 138 137 141 140  K 4.3 3.7 3.8 3.7 4.2  CL 105 105 105 106 108  CO2 24 25 28 27 23   GLUCOSE 120* 139* 143* 105* 104*  BUN 32* 45*  43* 39* 38*  CREATININE 1.35* 1.17 1.11 1.17 1.24  CALCIUM 8.4* 8.3* 8.3* 8.7* 8.2*  MG 2.0 2.0 2.0 2.3 2.1  PHOS 3.9  --   --   --   --     GFR: Estimated Creatinine Clearance: 40.5 mL/min (by C-G formula based on SCr of 1.24 mg/dL). Liver Function Tests: Recent Labs  Lab 05/16/21 0656 05/17/21 0450 05/18/21 0257 05/19/21 0751 05/20/21 0237  AST 98* 68* 49* 48* 47*  ALT 41 41 39 42 35  ALKPHOS 50 46 51 57 49  BILITOT 0.6 0.6 0.6 1.2 1.0  PROT 5.1* 4.7* 4.8* 5.4* 4.5*  ALBUMIN 2.7* 2.4* 2.4* 2.8* 2.5*    No results for input(s): LIPASE, AMYLASE in the last 168 hours. No results for input(s): AMMONIA in the last 168 hours. Coagulation Profile: No results for input(s): INR, PROTIME in the last 168 hours. Cardiac Enzymes: Recent Labs  Lab 05/15/21 2139  CKTOTAL 2,527*    BNP (last 3 results) No results for input(s): PROBNP in the  last 8760 hours. HbA1C: No results for input(s): HGBA1C in the last 72 hours. CBG: No results for input(s): GLUCAP in the last 168 hours. Lipid Profile: No results for input(s): CHOL, HDL, LDLCALC, TRIG, CHOLHDL, LDLDIRECT in the last 72 hours. Thyroid Function Tests: No results for input(s): TSH, T4TOTAL, FREET4, T3FREE, THYROIDAB in the last 72 hours. Anemia Panel: No results for input(s): VITAMINB12, FOLATE, FERRITIN, TIBC, IRON, RETICCTPCT in the last 72 hours.  Sepsis Labs: Recent Labs  Lab 05/15/21 0925 05/15/21 1259  PROCALCITON  --  0.10  LATICACIDVEN 1.2 1.2     Recent Results (from the past 240 hour(s))  Resp Panel by RT-PCR (Flu A&B, Covid) Nasopharyngeal Swab     Status: Abnormal   Collection Time: 05/14/21  8:08 PM   Specimen: Nasopharyngeal Swab; Nasopharyngeal(NP) swabs in vial transport medium  Result Value Ref Range Status   SARS Coronavirus 2 by RT PCR POSITIVE (A) NEGATIVE Final    Comment: (NOTE) SARS-CoV-2 target nucleic acids are DETECTED.  The SARS-CoV-2 RNA is generally detectable in upper  respiratory specimens during the acute phase of infection. Positive results are indicative of the presence of the identified virus, but do not rule out bacterial infection or co-infection with other pathogens not detected by the test. Clinical correlation with patient history and other diagnostic information is necessary to determine patient infection status. The expected result is Negative.  Fact Sheet for Patients: EntrepreneurPulse.com.au  Fact Sheet for Healthcare Providers: IncredibleEmployment.be  This test is not yet approved or cleared by the [place] FDA and  has been authorized for detection and/or diagnosis of SARS-CoV-2 by FDA under an Emergency Use Authorization (EUA).  This EUA will remain in effect (meaning this test can be used) for the duration of  the COVID-19 declaration under Section 564(b)(1) of the A ct, 21 U.S.C. section 360bbb-3(b)(1), unless the authorization is terminated or revoked sooner.     Influenza A by PCR NEGATIVE NEGATIVE Final   Influenza B by PCR NEGATIVE NEGATIVE Final    Comment: (NOTE) The Xpert Xpress SARS-CoV-2/FLU/RSV plus assay is intended as an aid in the diagnosis of influenza from Nasopharyngeal swab specimens and should not be used as a sole basis for treatment. Nasal washings and aspirates are unacceptable for Xpert Xpress SARS-CoV-2/FLU/RSV testing.  Fact Sheet for Patients: EntrepreneurPulse.com.au  Fact Sheet for Healthcare Providers: IncredibleEmployment.be  This test is not yet approved or cleared by the [place] FDA and has been authorized for detection and/or diagnosis of SARS-CoV-2 by FDA under an Emergency Use Authorization (EUA). This EUA will remain in effect (meaning this test can be used) for the duration of the COVID-19 declaration under Section 564(b)(1) of the Act, 21 U.S.C. section 360bbb-3(b)(1), unless the authorization is  terminated or revoked.  Performed at [facility], [place] [address], [place], [place] [address]   Culture, blood (routine x 2)     Status: None   Collection Time: 05/15/21  8:56 AM   Specimen: BLOOD RIGHT HAND  Result Value Ref Range Status   Specimen Description BLOOD RIGHT HAND  Final   Special Requests   Final    BOTTLES DRAWN AEROBIC ONLY Blood Culture results may not be optimal due to an inadequate volume of blood received in culture bottles   Culture   Final    NO GROWTH 5 DAYS Performed at [facility], [place] [address], [place], [place] [address]    Report Status 05/20/2021 FINAL  Final  Culture, blood (  routine x 2)     Status: None   Collection Time: 05/15/21  9:07 AM   Specimen: BLOOD  Result Value Ref Range Status   Specimen Description BLOOD LEFT ANTECUBITAL  Final   Special Requests   Final    BOTTLES DRAWN AEROBIC AND ANAEROBIC Blood Culture results may not be optimal due to an inadequate volume of blood received in culture bottles   Culture   Final    NO GROWTH 5 DAYS Performed at [facility], [place] [address] [place], [place] [address]    Report Status 05/20/2021 FINAL  Final          Radiology Studies: No results found.      Scheduled Meds:  apixaban  5 mg Oral BID   vitamin C  500 mg Oral Daily   lisinopril  2.5 mg Oral Daily   methylPREDNISolone (SOLU-MEDROL) injection  40 mg Intravenous Daily   QUEtiapine  12.5 mg Oral QHS   sodium chloride flush  3 mL Intravenous Q12H   zinc sulfate  220 mg Oral Daily   Continuous Infusions:          Aline August, MD [facility] 05/21/2021, 8:12 AM

## 2021-05-21 NOTE — Progress Notes (Signed)
Occupational Therapy Treatment Patient Details Name: Henry Franklin. MRN: [id number] DOB: 1927-03-03 Today's Date: 05/21/2021   History of present illness 87 y.o. male presents to [facility] hospital on 05/15/2021 after fall and difficulty ambulating at home. Pt found to be COVID+. PMH includes HTN, HLD, schemic cardiomyopathy, CAD, PAF on Eliquis, history of PE in 2015, essential tremor.   OT comments  Pt limited by lethargy today. Able to open eyes briefly and make basic yes/no indications with multimodal cueing provided; only slightly more alert EOB. Pt sat EOB with BUE and min A in static sitting position. Attempted grooming task in bed (hand over hand wiping face). OT presented strawed drink (pt unable to sip) and small bite of mashed potatoes to lips as lunch tray was present while pt sitting EOB. Pt able to move food off spoon into front of mouth but could not advance it further. OT provided oral care to clear mouth. Secure chat to RN sent after session, per RN pt had a rough night and given pain meds earlier today.  Pt may benefit from speech consult to further assess swallowing and reduce risk of aspiration. Also, pt noted to have intermittent spasmodic movements of BUE>BLE. Provided warm blankets with no change in these movements. Nursing notified. D/c plan remains appropriate.    Recommendations for follow up therapy are one component of a multi-disciplinary discharge planning process, led by the attending physician.  Recommendations may be updated based on patient status, additional functional criteria and insurance authorization.    Follow Up Recommendations  Skilled nursing-short term rehab (<3 hours/day)    Assistance Recommended at Discharge Frequent or constant Supervision/Assistance  Equipment Recommendations  Other (comment) (defer to next venue)    Recommendations for Other Services      Precautions / Restrictions Precautions Precautions: Fall Precaution Comments:  COVID+ Restrictions Weight Bearing Restrictions: No       Mobility Bed Mobility Overal bed mobility: Needs Assistance Bed Mobility: Supine to Sit;Sit to Sidelying;Rolling Rolling: Min assist   Supine to sit: Max assist;HOB elevated   Sit to sidelying: Max assist General bed mobility comments: Lethargic and with intermittent tremors BUE. Pt able to reach for side rail to assist with trunk elevation. Assist for all aspects.    Transfers                   General transfer comment: unable to safely attempt d/t lethargy and poor sitting balance.     Balance Overall balance assessment: Needs assistance Sitting-balance support: Bilateral upper extremity supported;Feet supported Sitting balance-Leahy Scale: Poor Sitting balance - Comments: BUE support and min A in static sitting. lethargic                                   ADL either performed or assessed with clinical judgement   ADL Overall ADL's : Needs assistance/impaired Eating/Feeding: Maximal assistance;Total assistance;Sitting Eating/Feeding Details (indicate cue type and reason): sitting EOB with BUE and min A in static sitting position. Placed spoon loaded with small amount of mashed potatoes. Pt able to weakly move food into front of mouth but unable to swallow or spit out. OT cleaned food out of pt's mouth with oral care swab.                                   General ADL Comments:  Profoundly weak. Intermittent, tremulous movements of BUE>BLE. BUE support and min A in static sitting    Extremity/Trunk Assessment Upper Extremity Assessment Upper Extremity Assessment: Generalized weakness   Lower Extremity Assessment Lower Extremity Assessment: Defer to PT evaluation        Vision       Perception     Praxis      Cognition Arousal/Alertness: Lethargic Behavior During Therapy: Flat affect Overall Cognitive Status: Difficult to assess                                  General Comments: Lethargic Slightly more alert EOB but still maintaining eyes closed most of the time. Limited verbalizations. Multimodal cueing. Able to indicate that he back was itching at end of session. Able to indicate yes/no preferences 75% of the time. Profoundly weak.          Exercises     Shoulder Instructions       General Comments      Pertinent Vitals/ Pain       Pain Assessment: Faces Faces Pain Scale: Hurts little more Pain Location: generalized Pain Descriptors / Indicators: Grimacing Pain Intervention(s): Monitored during session  Home Living                                          Prior Functioning/Environment              Frequency  Min 2X/week        Progress Toward Goals  OT Goals(current goals can now be found in the care plan section)  Progress towards OT goals: Not progressing toward goals - comment (lethargic this session, only slightly better sitting EOB)  Acute Rehab OT Goals Patient Stated Goal: did not state OT Goal Formulation: With patient Time For Goal Achievement: 2021-06-29 Potential to Achieve Goals: Good ADL Goals Pt Will Perform Lower Body Bathing: with mod assist;sit to/from stand Pt Will Perform Lower Body Dressing: with mod assist;sit to/from stand Pt Will Transfer to Toilet: ambulating;with mod assist;stand pivot transfer;with min assist Pt Will Perform Toileting - Clothing Manipulation and hygiene: with mod assist;sit to/from stand;sitting/lateral leans  Plan Discharge plan remains appropriate    Co-evaluation                 AM-PAC OT "6 Clicks" Daily Activity     Outcome Measure   Help from another person eating meals?: Total Help from another person taking care of personal grooming?: A Lot Help from another person toileting, which includes using toliet, bedpan, or urinal?: A Lot Help from another person bathing (including washing, rinsing, drying)?: A Lot Help from another  person to put on and taking off regular upper body clothing?: A Lot Help from another person to put on and taking off regular lower body clothing?: Total 6 Click Score: 10    End of Session    OT Visit Diagnosis: Unsteadiness on feet (R26.81);Muscle weakness (generalized) (M62.81);Other symptoms and signs involving cognitive function   Activity Tolerance Patient limited by lethargy   Patient Left in bed;with call bell/phone within reach;with bed alarm set   Nurse Communication Other (comment) (max difficulty swallowing. intermittent tremulous movements BUE>BLE)        Time: 6294-7654 OT Time Calculation (min): 22 min  Charges: OT General Charges $OT Visit: 1 Visit OT  Treatments $Self Care/Home Management : 8-22 mins  Tyrone Schimke, OT Acute Rehabilitation Services Office: [phone]   Hortencia Pilar 05/21/2021, 2:14 PM

## 2021-05-21 NOTE — Evaluation (Signed)
Clinical/Bedside Swallow Evaluation Patient Details  Name: Henry Franklin. MRN: [id number] Date of Birth: 1926-08-15  Today's Date: 05/21/2021 Time: SLP Start Time (ACUTE ONLY): 1520 SLP Stop Time (ACUTE ONLY): 1541 SLP Time Calculation (min) (ACUTE ONLY): 21 min  Past Medical History:  Past Medical History:  Diagnosis Date   CAD (coronary artery disease)    a. STEMI 6/17: LHC - oLAD 10, mLAD 50, dLAD 20, D2 inf subbranch 70 + sup subbranch 100, pRCA 20, oRPDA 40, EF 35-45% >> PCI:  2.25 x 12 mm resolute integrity DES to the superior subbranch of D2    Essential and other specified forms of tremor    External hemorrhoids without mention of complication    History of ST elevation myocardial infarction (STEMI)    a. Ant-Lat STEMI >> DES to D2 subbranch   History of stroke    History of TIA (transient ischemic attack)    HTN (hypertension)    Hyperlipidemia    Ischemic cardiomyopathy    a. Echo 6/17: Mid and distal anterior/inferior, septal and apical HK, mild concentric LVH, EF 30-35%, mild LAE  //  b. Echo 9/17 mild focal basal septal hypertrophy, EF 55-60%, normal wall motion, grade 1 diastolic dysfunction, mildly dilated aortic root (37 mm), normal RVSF, PASP 29 mmHg   Lumbago    Malignant neoplasm of prostate (HCC)    Nontraumatic rupture of tendons of biceps (long head)    Paroxysmal atrial fibrillation (HCC)    during admit for STEMI 6/17 >> triple Rx high risk >> ASA + Plavix x 30 days post MI, then Eliquis + Plavix   Sebaceous cyst    Supraventricular premature beats    Unspecified anomaly of tooth position    Past Surgical History:  Past Surgical History:  Procedure Laterality Date   APPENDECTOMY  1942   BACK SURGERY     with sciatica   CARDIAC CATHETERIZATION N/A 11/19/2015   Procedure: Left Heart Cath and Coronary Angiography;  Surgeon: Burnell Blanks, MD;  Location: [facility];  Service: Cardiovascular;  Laterality: N/A;   CARDIAC CATHETERIZATION N/A  11/19/2015   Procedure: Coronary Stent Intervention;  Surgeon: Burnell Blanks, MD;  Location: [facility] [facility];  Service: Cardiovascular;  Laterality: N/A;   prostate cancer     SPINE SURGERY N/A [name]   HPI:  87 y.o. male presents to [facility] hospital on 05/15/2021 after fall and difficulty ambulating at home. Pt found to be COVID+. CXR (05/15/21) revealed "Mild bibasilar airspace disease, greater left than right. Favor atelectasis. Especially at the left lung base, pneumonia cannot be excluded". PMH includes HTN, HLD, schemic cardiomyopathy, CAD, PAF on Eliquis, history of PE in 2015, essential tremor.    Assessment / Plan / Recommendation  Clinical Impression  Pt very lethargic upon SLP arrival, however responded to verbal cues/greeting and tactile stimulation with damp washcloth to face to achieve adequate alertness for PO intake. Oral mechanism examination unremarkable, although suspect generalized oral weakness. Thin liquids via cup and straw were significant for immediate overt coughing across all trials despite controlling for smaller volumes. NTL via cup resulted in x1 overt coughing, however suspect this was largely due to increasing lethargy as trials progressed. Prolonged bolus formation and oral holding noted primarily with regular textures, although complete oral clearance noted with all solids. Trials terminated due to significant lethargy requiring clinician to provide constant verbal and tactile stimulation cues in an attempt to maintain alertness, which would last  for only a few seconds. Recommend dys 1, NTL diet at this time given clinical presentation. Suspect likely diet advancement as mentation improves. Will f/u.  SLP Visit Diagnosis: Dysphagia, unspecified (R13.10)    Aspiration Risk  Moderate aspiration risk    Diet Recommendation Dysphagia 1 (Puree);Nectar-thick liquid   Liquid Administration via: Cup;Straw Medication Administration: Crushed with  puree Supervision: Staff to assist with self feeding;Full supervision/cueing for compensatory strategies Compensations: Minimize environmental distractions;Small sips/bites;Slow rate Postural Changes: Seated upright at 90 degrees    Other  Recommendations Oral Care Recommendations: Oral care BID;Staff/trained caregiver to provide oral care    Recommendations for follow up therapy are one component of a multi-disciplinary discharge planning process, led by the attending physician.  Recommendations may be updated based on patient status, additional functional criteria and insurance authorization.  Follow up Recommendations Other (comment) (TBD)      Assistance Recommended at Discharge Other (comment) (TBD)  Functional Status Assessment Patient has had a recent decline in their functional status and demonstrates the ability to make significant improvements in function in a reasonable and predictable amount of time.  Frequency and Duration min 2x/week  2 weeks       Prognosis Prognosis for Safe Diet Advancement: Good Barriers to Reach Goals: Time post onset;Cognitive deficits      Swallow Study   General Date of Onset: 05/21/21 HPI: 87 y.o. male presents to [facility] hospital on 05/15/2021 after fall and difficulty ambulating at home. Pt found to be COVID+. CXR (05/15/21) revealed "Mild bibasilar airspace disease, greater left than right. Favor atelectasis. Especially at the left lung base, pneumonia cannot be excluded". PMH includes HTN, HLD, schemic cardiomyopathy, CAD, PAF on Eliquis, history of PE in 2015, essential tremor. Type of Study: Bedside Swallow Evaluation Previous Swallow Assessment: none per EMR Diet Prior to this Study: Regular;Thin liquids Temperature Spikes Noted: No Respiratory Status: Room air History of Recent Intubation: No Behavior/Cognition: Cooperative;Pleasant mood;Lethargic/Drowsy Oral Cavity Assessment: Within Functional Limits Oral Care Completed by SLP: No Oral  Cavity - Dentition: Adequate natural dentition;Missing dentition Vision:  (difficult to assess) Self-Feeding Abilities: Needs assist Patient Positioning: Upright in bed;Postural control adequate for testing Baseline Vocal Quality: Normal Volitional Cough: Strong Volitional Swallow: Unable to elicit    Oral/Motor/Sensory Function Overall Oral Motor/Sensory Function: Generalized oral weakness   Ice Chips Ice chips: Not tested   Thin Liquid Thin Liquid: Impaired Presentation: Cup;Straw Pharyngeal  Phase Impairments: Cough - Immediate;Suspected delayed Swallow    Nectar Thick Nectar Thick Liquid: Impaired Presentation: Cup Pharyngeal Phase Impairments: Cough - Immediate   Honey Thick Honey Thick Liquid: Not tested   Puree Puree: Within functional limits Presentation: Spoon   Solid     Solid: Impaired Presentation: Self Fed Oral Phase Impairments: Poor awareness of bolus Oral Phase Functional Implications: Impaired mastication;Oral holding     Ellwood Dense, [place], [place] Office Number: [phone]  Acie Fredrickson 05/21/2021,4:02 PM

## 2021-05-21 NOTE — TOC Progression Note (Addendum)
Transition of Care (TOC) - Initial/Assessment Note    Patient Details  Name: Henry Franklin. MRN: [id number] Date of Birth: 22-May-1927  Transition of Care [facility]) CM/SW Contact:    Milinda Antis, LCSWA Phone Number: 05/21/2021, 10:17 AM  Clinical Narrative:                 CSW contacted the patient's son to present bed offers.  The son will speak his spouse and inform CSW of the decision by noon today.  CSW contacted [place]  to inquire about whether the patient's [employer] Medicare was managed by their agency.  It is not.  Once the patient's family chooses a facility, the facility will begin insurance auth.    11:15-  Family choose [facility] SN[facility].  CSW contacted [facility], verified that the facility still has beds available, and informed the facility that they will need to begin insurance auth.   CSW updated the patient's son on the status of SNF placement.     PASRR number received- [id number]  Expected Discharge Plan: [place] Barriers to Discharge: Continued Medical Work up, Other (must enter comment) (Patient refusing SNF, verifying natural supports)   Patient Goals and CMS Choice Patient states their goals for this hospitalization and ongoing recovery are:: To go home CMS Medicare.gov Compare Post Acute Care list provided to:: Patient Choice offered to / list presented to : Patient  Expected Discharge Plan and Services Expected Discharge Plan: [place] arrangements for the past 2 months: Single Family Home                                      Prior Living Arrangements/Services Living arrangements for the past 2 months: Single Family Home Lives with:: Spouse Patient language and need for interpreter reviewed:: Yes Do you feel safe going back to the place where you live?: Yes      Need for Family Participation in Patient Care: Yes (Comment) Care giver support system in place?: Yes (comment)   Criminal  Activity/Legal Involvement Pertinent to Current Situation/Hospitalization: No - Comment as needed  Activities of Daily Living Home Assistive Devices/Equipment: Other (Comment) ADL Screening (condition at time of admission) Patient's cognitive ability adequate to safely complete daily activities?: No Is the patient deaf or have difficulty hearing?: No Does the patient have difficulty seeing, even when wearing glasses/contacts?: No Does the patient have difficulty concentrating, remembering, or making decisions?: Yes Patient able to express need for assistance with ADLs?: No Does the patient have difficulty dressing or bathing?: Yes Independently performs ADLs?: No Does the patient have difficulty walking or climbing stairs?: Yes Weakness of Legs: Both Weakness of Arms/Hands: Both  Permission Sought/Granted Permission sought to share information with : Facility Sport and exercise psychologist, Family Supports Permission granted to share information with : Yes, Verbal Permission Granted              Emotional Assessment   Attitude/Demeanor/Rapport: Engaged   Orientation: : Oriented to Self, Oriented to Place, Oriented to  Time, Oriented to Situation Alcohol / Substance Use: Not Applicable Psych Involvement: No (comment)  Admission diagnosis:  Weakness [R53.1] Acute cough [R05.1] COVID-19 [U07.1] Patient Active Problem List   Diagnosis Date Noted   COVID-19 05/15/2021   Hypertensive urgency 05/15/2021   Fever 05/15/2021   Renal insufficiency 05/15/2021   History of pulmonary embolism 05/15/2021   Bilateral  leg edema 04/05/2021   Prediabetes 04/04/2021   Rash 11/11/2020   Allergic rhinitis 11/08/2020   Impaired glucose tolerance 11/08/2020   Pain due to onychomycosis of toenails of both feet 02/14/2020   Impacted cerumen, bilateral 09/14/2019   Unspecified hearing loss, unspecified ear 09/14/2019   Memory difficulties 08/24/2019   Frequent falls 08/11/2018   Poor balance  08/11/2018   Physical deconditioning 08/11/2018   Neck pain 02/24/2017   Cervical paraspinal muscle spasm 12/23/2016   CAD (coronary artery disease) 12/03/2015   Atrial fibrillation ([place]) 11/21/2015   STEMI (ST elevation myocardial infarction) ([place]) 11/19/2015   Personal history of transient ischemic attack (TIA), and cerebral infarction without residual deficits 05/31/2014   Pulmonary embolism ([place]) 05/20/2014   Prostate cancer ([place]) 04/05/2014   Lumbar spondylosis 11/15/2013   Fall at home, initial encounter 05/10/2013   Hyperlipidemia    Benign essential tremor    Lumbago    Palpitations    PCP:  [name], MD Pharmacy:   [id number] [place], [place] [address] [place] [address] Phone: [phone] Fax: [phone]  [id number] [place], [place] [address] [place] [address] Phone: [phone] Fax: [phone]     Social Determinants of Health (SDOH) Interventions    Readmission Risk Interventions No flowsheet data found.

## 2021-05-22 ENCOUNTER — Telehealth: Payer: Medicare HMO

## 2021-05-22 LAB — CBC WITH DIFFERENTIAL/PLATELET
Abs Immature Granulocytes: 0.12 10*3/uL — ABNORMAL HIGH (ref 0.00–0.07)
Basophils Absolute: 0 10*3/uL (ref 0.0–0.1)
Basophils Relative: 0 %
Eosinophils Absolute: 0.1 10*3/uL (ref 0.0–0.5)
Eosinophils Relative: 0 %
HCT: 46.8 % (ref 39.0–52.0)
Hemoglobin: 15.2 g/dL (ref 13.0–17.0)
Immature Granulocytes: 1 %
Lymphocytes Relative: 7 %
Lymphs Abs: 1.1 10*3/uL (ref 0.7–4.0)
MCH: 30 pg (ref 26.0–34.0)
MCHC: 32.5 g/dL (ref 30.0–36.0)
MCV: 92.5 fL (ref 80.0–100.0)
Monocytes Absolute: 1.3 10*3/uL — ABNORMAL HIGH (ref 0.1–1.0)
Monocytes Relative: 8 %
Neutro Abs: 13.9 10*3/uL — ABNORMAL HIGH (ref 1.7–7.7)
Neutrophils Relative %: 84 %
Platelets: 259 10*3/uL (ref 150–400)
RBC: 5.06 MIL/uL (ref 4.22–5.81)
RDW: 14.1 % (ref 11.5–15.5)
WBC: 16.5 10*3/uL — ABNORMAL HIGH (ref 4.0–10.5)
nRBC: 0 % (ref 0.0–0.2)

## 2021-05-22 LAB — COMPREHENSIVE METABOLIC PANEL
ALT: 35 U/L (ref 0–44)
AST: 25 U/L (ref 15–41)
Albumin: 2.5 g/dL — ABNORMAL LOW (ref 3.5–5.0)
Alkaline Phosphatase: 52 U/L (ref 38–126)
Anion gap: 5 (ref 5–15)
BUN: 32 mg/dL — ABNORMAL HIGH (ref 8–23)
CO2: 29 mmol/L (ref 22–32)
Calcium: 8.5 mg/dL — ABNORMAL LOW (ref 8.9–10.3)
Chloride: 112 mmol/L — ABNORMAL HIGH (ref 98–111)
Creatinine, Ser: 1.11 mg/dL (ref 0.61–1.24)
GFR, Estimated: >60 mL/min (ref >60)
Glucose, Bld: 112 mg/dL — ABNORMAL HIGH (ref 70–99)
Potassium: 3.7 mmol/L (ref 3.5–5.1)
Sodium: 146 mmol/L — ABNORMAL HIGH (ref 135–145)
Total Bilirubin: 0.9 mg/dL (ref 0.3–1.2)
Total Protein: 4.6 g/dL — ABNORMAL LOW (ref 6.5–8.1)

## 2021-05-22 LAB — D-DIMER, QUANTITATIVE: D-Dimer, Quant: 0.31 ug/mL-FEU (ref 0.00–0.50)

## 2021-05-22 LAB — C-REACTIVE PROTEIN: CRP: 2.2 mg/dL — ABNORMAL HIGH (ref <1.0)

## 2021-05-22 MED ORDER — DEXTROSE IN LACTATED RINGERS 5 % IV SOLN: INTRAVENOUS | Status: AC

## 2021-05-22 MED ORDER — LIDOCAINE 5 % EX PTCH
1.0000 | MEDICATED_PATCH | CUTANEOUS | Status: DC
  Administered 2021-05-22 – 2021-05-23 (×2): 1 via TRANSDERMAL
  Filled 2021-05-22: qty 1

## 2021-05-22 MED ORDER — LACTATED RINGERS IV SOLN: INTRAVENOUS | Status: DC

## 2021-05-22 MED ORDER — PREDNISONE 20 MG PO TABS
20.0000 mg | ORAL_TABLET | Freq: Every day | ORAL | Status: DC
  Administered 2021-05-23: 20 mg via ORAL
  Filled 2021-05-22: qty 1

## 2021-05-22 NOTE — Progress Notes (Signed)
Speech Language Pathology Treatment: Dysphagia  Patient Details Name: Henry Franklin. MRN: [id number] DOB: Aug 20, 1926 Today's Date: 05/22/2021 Time: 7782-4235 SLP Time Calculation (min) (ACUTE ONLY): 24 min  Assessment / Plan / Recommendation Clinical Impression  Pt with improved alertness and maintenance of attention to PO intake this am, though intermittent lethargy persists. Pt participated in completion of oral care prior to trials of ice chips, thin liquids, NTL and bites of puree. He expressed eagerness for liquid consumption. Ice chips x3 resulted in no overt s/sx of aspiration and adequate oral manipulation noted. Immediate coughing and wet vocal quality were present in 50% of thin liquid trials via cup and suspect delay in swallow initiation and airway compromise. When given min verbal cues to maintain alertness, small controlled sips of thin liquids were without s/sx of aspiration. Sips of NTL via cup x5 were also without signs of aspiration. Bites of puree consumed without difficulty, although mild bolus hold observed, suspect impact of reduced mentation/lethargy. Overall, pt swallow function at bedside appears improved from evaluation and suspect will continue to as mentation improves. Recommend continue dys 1, NTL diet at this time. Will f/u for continued trials to determine pt need for and ability to participate in instrumental assessment if warranted. Above discussed with RN.   HPI HPI: 87 y.o. male presents to [facility] hospital on 05/15/2021 after fall and difficulty ambulating at home. Pt found to be COVID+. CXR (05/15/21) revealed "Mild bibasilar airspace disease, greater left than right. Favor atelectasis. Especially at the left lung base, pneumonia cannot be excluded". PMH includes HTN, HLD, schemic cardiomyopathy, CAD, PAF on Eliquis, history of PE in 2015, essential tremor, and reports of dementia.      SLP Plan  Continue with current plan of care      Recommendations for follow  up therapy are one component of a multi-disciplinary discharge planning process, led by the attending physician.  Recommendations may be updated based on patient status, additional functional criteria and insurance authorization.    Recommendations  Diet recommendations: Dysphagia 1 (puree);Nectar-thick liquid Liquids provided via: Cup;Straw Medication Administration: Crushed with puree Supervision: Staff to assist with self feeding;Full supervision/cueing for compensatory strategies Compensations: Minimize environmental distractions;Small sips/bites;Slow rate Postural Changes and/or Swallow Maneuvers: Seated upright 90 degrees                Oral Care Recommendations: Oral care BID;Staff/trained caregiver to provide oral care Follow Up Recommendations: Other (comment) (TBD) Assistance recommended at discharge: Other (comment) (TBD) SLP Visit Diagnosis: Dysphagia, unspecified (R13.10) Plan: Continue with current plan of care          Ellwood Dense, [place], [place] Office Number: [phone]  Acie Fredrickson  05/22/2021, 9:17 AM

## 2021-05-22 NOTE — Plan of Care (Signed)
  Problem: Clinical Measurements: Goal: Respiratory complications will improve Outcome: Adequate for Discharge   

## 2021-05-22 NOTE — TOC Progression Note (Signed)
Transition of Care (TOC) - Initial/Assessment Note    Patient Details  Name: Henry Franklin. MRN: [id number] Date of Birth: 03/22/1927  Transition of Care [facility]) CM/SW Contact:    Milinda Antis, LCSWA Phone Number: 05/22/2021, 2:52 PM  Clinical Narrative:                 CSW was contacted by Claiborne Billings with admissions at [facility].  The insurance company is requesting a peer to peer before 9am tomorrow.  [phone] ex. [phone].  CSW contacted the physician and received the phone number and times that she will be available.  CSW called the insurance company and left a message with the available times that the attending will be available to complete the peer to peer.    Pending:  Biochemist, clinical.  Expected Discharge Plan: Skilled Nursing Facility Barriers to Discharge: Continued Medical Work up, Other (must enter comment) (Patient refusing SNF, verifying natural supports)   Patient Goals and CMS Choice Patient states their goals for this hospitalization and ongoing recovery are:: To go home CMS Medicare.gov Compare Post Acute Care list provided to:: Patient Choice offered to / list presented to : Patient  Expected Discharge Plan and Services Expected Discharge Plan: Franklin arrangements for the past 2 months: Single Family Home                                      Prior Living Arrangements/Services Living arrangements for the past 2 months: Single Family Home Lives with:: Spouse Patient language and need for interpreter reviewed:: Yes Do you feel safe going back to the place where you live?: Yes      Need for Family Participation in Patient Care: Yes (Comment) Care giver support system in place?: Yes (comment)   Criminal Activity/Legal Involvement Pertinent to Current Situation/Hospitalization: No - Comment as needed  Activities of Daily Living Home Assistive Devices/Equipment: Other (Comment) ADL Screening (condition at time  of admission) Patient's cognitive ability adequate to safely complete daily activities?: No Is the patient deaf or have difficulty hearing?: No Does the patient have difficulty seeing, even when wearing glasses/contacts?: No Does the patient have difficulty concentrating, remembering, or making decisions?: Yes Patient able to express need for assistance with ADLs?: No Does the patient have difficulty dressing or bathing?: Yes Independently performs ADLs?: No Does the patient have difficulty walking or climbing stairs?: Yes Weakness of Legs: Both Weakness of Arms/Hands: Both  Permission Sought/Granted Permission sought to share information with : Facility Sport and exercise psychologist, Family Supports Permission granted to share information with : Yes, Verbal Permission Granted              Emotional Assessment   Attitude/Demeanor/Rapport: Engaged   Orientation: : Oriented to Self, Oriented to Place, Oriented to  Time, Oriented to Situation Alcohol / Substance Use: Not Applicable Psych Involvement: No (comment)  Admission diagnosis:  Weakness [R53.1] Acute cough [R05.1] COVID-19 [U07.1] Patient Active Problem List   Diagnosis Date Noted   COVID-19 05/15/2021   Hypertensive urgency 05/15/2021   Fever 05/15/2021   Renal insufficiency 05/15/2021   History of pulmonary embolism 05/15/2021   Bilateral leg edema 04/05/2021   Prediabetes 04/04/2021   Rash 11/11/2020   Allergic rhinitis 11/08/2020   Impaired glucose tolerance 11/08/2020   Pain due to onychomycosis of toenails of both feet 02/14/2020   Impacted cerumen, bilateral 09/14/2019  Unspecified hearing loss, unspecified ear 09/14/2019   Memory difficulties 08/24/2019   Frequent falls 08/11/2018   Poor balance 08/11/2018   Physical deconditioning 08/11/2018   Neck pain 02/24/2017   Cervical paraspinal muscle spasm 12/23/2016   CAD (coronary artery disease) 12/03/2015   Atrial fibrillation ([place]) 11/21/2015   STEMI (ST  elevation myocardial infarction) ([place]) 11/19/2015   Personal history of transient ischemic attack (TIA), and cerebral infarction without residual deficits 05/31/2014   Pulmonary embolism ([place]) 05/20/2014   Prostate cancer ([place]) 04/05/2014   Lumbar spondylosis 11/15/2013   Fall at home, initial encounter 05/10/2013   Hyperlipidemia    Benign essential tremor    Lumbago    Palpitations    PCP:  [name], MD Pharmacy:   [id number] [place], [place] [address] [place] [address] Phone: [phone] Fax: [phone]  [id number] [place], [place] [address] [place] [address] Phone: [phone] Fax: [phone]     Social Determinants of Health (SDOH) Interventions    Readmission Risk Interventions No flowsheet data found.

## 2021-05-22 NOTE — Progress Notes (Signed)
Physical Therapy Treatment Patient Details Name: Henry Franklin. MRN: [id number] DOB: [birth year] Today's Date: 05/22/2021   History of Present Illness 87 y.o. male presents to [facility] hospital on 05/15/2021 after fall and difficulty ambulating at home. Pt found to be COVID+. PMH includes HTN, HLD, schemic cardiomyopathy, CAD, PAF on Eliquis, history of PE in 2015, essential tremor.    PT Comments    Pt agreeable and motivated to participate. Requiring moderate assist for bed mobility and two person assist for out of bed transfers. Once up in chair, worked on serial sit to stands, with emphasis on rocking forward to gain momentum. Pt continues with generalized weakness/deconditioning, poor balance, decreased endurance, and cognition. Continue to recommend SNF for ongoing Physical Therapy.      Recommendations for follow up therapy are one component of a multi-disciplinary discharge planning process, led by the attending physician.  Recommendations may be updated based on patient status, additional functional criteria and insurance authorization.  Follow Up Recommendations  Skilled nursing-short term rehab (<3 hours/day)     Assistance Recommended at Discharge Frequent or constant Supervision/Assistance  Equipment Recommendations  Wheelchair (measurements PT)    Recommendations for Other Services       Precautions / Restrictions Precautions Precautions: Fall Precaution Comments: COVID+ Restrictions Weight Bearing Restrictions: No     Mobility  Bed Mobility Overal bed mobility: Needs Assistance Bed Mobility: Supine to Sit     Supine to sit: Mod assist     General bed mobility comments: Pt initiating well, use of bed pad to scoot hips forward, cues for pushing off of bed rail    Transfers Overall transfer level: Needs assistance Equipment used: Rolling walker (2 wheels);2 person hand held assist Transfers: Sit to/from Stand;Bed to chair/wheelchair/BSC Sit to Stand: Mod  assist;+2 physical assistance Stand pivot transfers: Mod assist;+2 physical assistance         General transfer comment: Heavy modA + 2 to stand from edge of bed up to walker, heavy posterior lean and difficulty weight shifting forward with feet sliding forwards. Trialed again with no AD and handheld assist to pivot over from chair to bed. Pt performed subsequently x 3 stands from recliner to walker with foot block,  cues for hand placement, "nose over toes," and rocking to gain momentum.    Ambulation/Gait                   Stairs             Wheelchair Mobility    Modified Rankin (Stroke Patients Only)       Balance Overall balance assessment: Needs assistance Sitting-balance support: Feet supported;Bilateral upper extremity supported Sitting balance-Leahy Scale: Poor Sitting balance - Comments: reliant on BUE support   Standing balance support: Bilateral upper extremity supported Standing balance-Leahy Scale: Poor                              Cognition Arousal/Alertness: Awake/alert Behavior During Therapy: WFL for tasks assessed/performed Overall Cognitive Status: Impaired/Different from baseline Area of Impairment: Attention;Memory;Following commands;Safety/judgement;Awareness;Problem solving                   Current Attention Level: Sustained Memory: Decreased short-term memory Following Commands: Follows one step commands consistently;Follows multi-step commands inconsistently Safety/Judgement: Decreased awareness of deficits Awareness: Intellectual Problem Solving: Slow processing;Decreased initiation;Requires verbal cues;Requires tactile cues;Difficulty sequencing General Comments: Pt is alert today, needs multimodal cues for command following  Exercises General Exercises - Lower Extremity Long Arc Quad: Both;Seated;AROM;10 reps Hip Flexion/Marching: Both;10 reps;Seated Other Exercises Other Exercises: seated:  scapular "Punches," into pillows with BUE's x 10    General Comments        Pertinent Vitals/Pain Pain Assessment: Faces Faces Pain Scale: Hurts little more Pain Location: generalized Pain Descriptors / Indicators: Grimacing Pain Intervention(s): Monitored during session    Home Living                          Prior Function            PT Goals (current goals can now be found in the care plan section) Acute Rehab PT Goals Patient Stated Goal: to improve strength and energy Potential to Achieve Goals: Fair Progress towards PT goals: Progressing toward goals    Frequency    Min 2X/week      PT Plan Current plan remains appropriate    Co-evaluation              AM-PAC PT "6 Clicks" Mobility   Outcome Measure  Help needed turning from your back to your side while in a flat bed without using bedrails?: A Lot Help needed moving from lying on your back to sitting on the side of a flat bed without using bedrails?: A Lot Help needed moving to and from a bed to a chair (including a wheelchair)?: Total Help needed standing up from a chair using your arms (e.g., wheelchair or bedside chair)?: Total Help needed to walk in hospital room?: Total Help needed climbing 3-5 steps with a railing? : Total 6 Click Score: 8    End of Session Equipment Utilized During Treatment: Gait belt Activity Tolerance: Patient limited by fatigue Patient left: with call bell/phone within reach;in chair;with chair alarm set Nurse Communication: Mobility status PT Visit Diagnosis: Other abnormalities of gait and mobility (R26.89);Muscle weakness (generalized) (M62.81);History of falling (Z91.81)     Time: 1884-1660 PT Time Calculation (min) (ACUTE ONLY): 28 min  Charges:  $Therapeutic Activity: 23-37 mins                     Wyona Almas, PT, DPT Acute Rehabilitation Services Pager [phone] Office [phone]    Deno Etienne 05/22/2021, 3:15 PM

## 2021-05-22 NOTE — Progress Notes (Signed)
Patient ID: Henry Chamberlin., male   DOB: 03/15/1927, 87 y.o.   MRN: [id number]  PROGRESS NOTE    Henry Cotta.  HKV:[id number] DOB: [birth year] DOA: 05/14/2021 PCP: [name], MD   Brief Narrative:  87 y.o. male with medical history significant of hypertension, hyperlipidemia, ischemic cardiomyopathy, CAD, PAF on Eliquis, history of PE in 2015, essential tremor, and reports of dementia presented with worsening weakness and fall.  Patient was found to be COVID-positive at home.  In route with EMS, patient was febrile to 101.  On presentation to the ED, he was tachypneic with creatinine of 1.32 and lactic acid of 1.2.  Chest x-ray showed mild bibasilar airspace disease greater on the left than the right, left lower lung pneumonia could not be excluded.  CT of the head and cervical spine was negative for any acute abnormalities.  COVID-19 testing was positive.  He was started on IV remdesivir and subsequently on Solu-Medrol as well.  PT recommended SNF placement.  He has had intermittent episodes of agitation requiring IV Haldol in the hospital.  Assessment & Plan:   COVID-19 pneumonia -Received booster dose in 04/2021 --.-Chest x-ray: Could not rule out left lower lobe pneumonia, procalcitonin less than 0.1, blood culture no growth - Completed 5 days of IV remdesivir.  Plan to finish total of 10 days of steroid treatment, taper steroid. -   inflammatory markers down, on room air, no fever, I did not hear any cough today   FTT,  Presented with worsening generalized weakness and fall.  CT of the head and neck negative for any acute abnormality Patient lives with wife at home prior to admission, per pcp note from 04/2021 he was at baseline doing well, he received covid vaccine and was told to return in one year Increased weakness likely due to acute illness Seen by PT recommend SNF  Leukocytosis WBC today 16.5 Likely from steroid, also have poor oral intake ,taper steroid, start gentle  hydration ,monitor CBC  Hyponatremia Sodium 146 today Likely from dehydration Start hydration Repeat BMP in the morning  Acute kidney injury, present on admission -Creatinine 1.32 on presentation; 1.06 on 10/05/2020.   -Creatinine improved .  Encourage oral intake. -Monitor BMP   Hypertensive urgency, on admission -Blood pressure improving.  Continue lisinopril.   Paroxysmal A. fib on chronic anticoagulation -Rate controlled.  Continue Eliquis.  Outpatient follow-up with cardiology   History of PE in 2015 -Continue Eliquis  Hyperlipidemia -Statin on hold due to elevated liver enzymes -LFT improved, plan to resume statin at discharge  Elevated LFTs -Questionable cause.  Improving.  Monitor  -Possible related to mild rhabdomyolysis -LFT normalized  Elevated CK -Possibly from fall and mild rhabdomyolysis.    Memory difficulties Intermittent agitation/delirium -Patient's son reported that his father likely had some dementia, no formal diagnosis  -Delirium precautions.  Fall precautions.  PT eval. -After discussion with patient's son on 05/16/2021, son decided to change his father's CODE STATUS to DNR. -per previous provider, Palliative care consultation requested for goals of care discussion, I do not see a note from palliative care, will reorder palliative care consult  -Patient has had issues with intermittent agitation requiring intermittent Haldol.  Low-dose Seroquel started on 05/20/2021  Spinal stenosis -At baseline, patient walks with the use of walker and has a history of severe spinal stenosis previously documented by MRI. -back pain, will order lidocaine patch , per RN he is easily sedated with narcotic analgesics   DVT prophylaxis:  Eliquis Code Status: DNR Family Communication: Son on phone on 05/22/2021 disposition Plan: Status is: inpatient because: need for nursing home placement  Consultants: palliative care  Procedures: None  Antimicrobials:   Anti-infectives (From admission, onward)    Start     Dose/Rate Route Frequency Ordered Stop   05/16/21 1000  remdesivir 100 mg in sodium chloride 0.9 % 100 mL IVPB       See Hyperspace for full Linked Orders Report.   100 mg 200 mL/hr over 30 Minutes Intravenous Daily 05/15/21 1035 05/19/21 0953   05/15/21 1045  remdesivir 200 mg in sodium chloride 0.9% 250 mL IVPB       See Hyperspace for full Linked Orders Report.   200 mg 580 mL/hr over 30 Minutes Intravenous Once 05/15/21 1035 05/15/21 1635        Subjective: Patient seen and examined at bedside.  Poor historian.  No fever, vomiting, chest pain reported.   He is on room air, reports back pain, he is oriented x3 during encounter   Objective: Vitals:   05/21/21 0914 05/21/21 2016 05/22/21 0637 05/22/21 0949  BP: 111/69 134/64 (!) 141/94 107/73  Pulse: (!) 106 82 93 (!) 47  Resp: 16 18 18 18   Temp: (!) 97.4 F (36.3 C) 97.6 F (36.4 C) 98.1 F (36.7 C) 97.8 F (36.6 C)  TempSrc: Oral Oral Oral Oral  SpO2: 99% 97% 95% 92%  Weight:  60.7 kg    Height:        Intake/Output Summary (Last 24 hours) at 05/22/2021 1526 Last data filed at 05/22/2021 1240 Gross per 24 hour  Intake 360 ml  Output 450 ml  Net -90 ml   Filed Weights   05/14/21 1955 05/21/21 2016  Weight: 83 kg 60.7 kg    Examination:  General exam: No distress.  On room air currently.  Elderly male lying in bed.  Hard of hearing. aaox3 Respiratory system: Decreased breath sounds at bases bilaterally with some crackles  cardiovascular system: Currently rate controlled; S1-S2 heard gastrointestinal system: Abdomen is distended slightly; soft and nontender.  Bowel sounds are heard extremities: Mild lower extremity edema present; no clubbing  Central nervous system: aaox3 during encounter this am.  Poor historian.  No focal neurological deficits.  Moves extremities skin: No obvious ecchymosis/lesions psychiatry: Currently no signs of agitation.  Affect  is flat.  Data Reviewed: I have personally reviewed following labs and imaging studies  CBC: Recent Labs  Lab 05/16/21 0656 05/17/21 0450 05/18/21 0257 05/22/21 0428  WBC 9.9 10.6* 9.2 16.5*  NEUTROABS 7.9* 8.9* 8.0* 13.9*  HGB 15.8 15.4 16.0 15.2  HCT 48.3 46.1 47.2 46.8  MCV 94.3 92.0 92.2 92.5  PLT 155 158 168 073   Basic Metabolic Panel: Recent Labs  Lab 05/16/21 0656 05/17/21 0450 05/18/21 0257 05/19/21 0751 05/20/21 0237 05/22/21 0428  NA 139 138 137 141 140 146*  K 4.3 3.7 3.8 3.7 4.2 3.7  CL 105 105 105 106 108 112*  CO2 24 25 28 27 23 29   GLUCOSE 120* 139* 143* 105* 104* 112*  BUN 32* 45* 43* 39* 38* 32*  CREATININE 1.35* 1.17 1.11 1.17 1.24 1.11  CALCIUM 8.4* 8.3* 8.3* 8.7* 8.2* 8.5*  MG 2.0 2.0 2.0 2.3 2.1  --   PHOS 3.9  --   --   --   --   --    GFR: Estimated Creatinine Clearance: 36.5 mL/min (by C-G formula based on SCr of 1.11 mg/dL). Liver Function  Tests: Recent Labs  Lab 05/17/21 0450 05/18/21 0257 05/19/21 0751 05/20/21 0237 05/22/21 0428  AST 68* 49* 48* 47* 25  ALT 41 39 42 35 35  ALKPHOS 46 51 57 49 52  BILITOT 0.6 0.6 1.2 1.0 0.9  PROT 4.7* 4.8* 5.4* 4.5* 4.6*  ALBUMIN 2.4* 2.4* 2.8* 2.5* 2.5*   No results for input(s): LIPASE, AMYLASE in the last 168 hours. No results for input(s): AMMONIA in the last 168 hours. Coagulation Profile: No results for input(s): INR, PROTIME in the last 168 hours. Cardiac Enzymes: Recent Labs  Lab 05/15/21 2139  CKTOTAL 2,527*   BNP (last 3 results) No results for input(s): PROBNP in the last 8760 hours. HbA1C: No results for input(s): HGBA1C in the last 72 hours. CBG: No results for input(s): GLUCAP in the last 168 hours. Lipid Profile: No results for input(s): CHOL, HDL, LDLCALC, TRIG, CHOLHDL, LDLDIRECT in the last 72 hours. Thyroid Function Tests: No results for input(s): TSH, T4TOTAL, FREET4, T3FREE, THYROIDAB in the last 72 hours. Anemia Panel: No results for input(s): VITAMINB12,  FOLATE, FERRITIN, TIBC, IRON, RETICCTPCT in the last 72 hours.  Sepsis Labs: No results for input(s): PROCALCITON, LATICACIDVEN in the last 168 hours.  Recent Results (from the past 240 hour(s))  Resp Panel by RT-PCR (Flu A&B, Covid) Nasopharyngeal Swab     Status: Abnormal   Collection Time: 05/14/21  8:08 PM   Specimen: Nasopharyngeal Swab; Nasopharyngeal(NP) swabs in vial transport medium  Result Value Ref Range Status   SARS Coronavirus 2 by RT PCR POSITIVE (A) NEGATIVE Final    Comment: (NOTE) SARS-CoV-2 target nucleic acids are DETECTED.  The SARS-CoV-2 RNA is generally detectable in upper respiratory specimens during the acute phase of infection. Positive results are indicative of the presence of the identified virus, but do not rule out bacterial infection or co-infection with other pathogens not detected by the test. Clinical correlation with patient history and other diagnostic information is necessary to determine patient infection status. The expected result is Negative.  Fact Sheet for Patients: EntrepreneurPulse.com.au  Fact Sheet for Healthcare Providers: IncredibleEmployment.be  This test is not yet approved or cleared by the [place] FDA and  has been authorized for detection and/or diagnosis of SARS-CoV-2 by FDA under an Emergency Use Authorization (EUA).  This EUA will remain in effect (meaning this test can be used) for the duration of  the COVID-19 declaration under Section 564(b)(1) of the A ct, 21 U.S.C. section 360bbb-3(b)(1), unless the authorization is terminated or revoked sooner.     Influenza A by PCR NEGATIVE NEGATIVE Final   Influenza B by PCR NEGATIVE NEGATIVE Final    Comment: (NOTE) The Xpert Xpress SARS-CoV-2/FLU/RSV plus assay is intended as an aid in the diagnosis of influenza from Nasopharyngeal swab specimens and should not be used as a sole basis for treatment. Nasal washings and aspirates are  unacceptable for Xpert Xpress SARS-CoV-2/FLU/RSV testing.  Fact Sheet for Patients: EntrepreneurPulse.com.au  Fact Sheet for Healthcare Providers: IncredibleEmployment.be  This test is not yet approved or cleared by the [place] FDA and has been authorized for detection and/or diagnosis of SARS-CoV-2 by FDA under an Emergency Use Authorization (EUA). This EUA will remain in effect (meaning this test can be used) for the duration of the COVID-19 declaration under Section 564(b)(1) of the Act, 21 U.S.C. section 360bbb-3(b)(1), unless the authorization is terminated or revoked.  Performed at [facility], [place] [address] [place],  [address]   Culture, blood (routine  x 2)     Status: None   Collection Time: 05/15/21  8:56 AM   Specimen: BLOOD RIGHT HAND  Result Value Ref Range Status   Specimen Description BLOOD RIGHT HAND  Final   Special Requests   Final    BOTTLES DRAWN AEROBIC ONLY Blood Culture results may not be optimal due to an inadequate volume of blood received in culture bottles   Culture   Final    NO GROWTH 5 DAYS Performed at [facility], [place] [address] [place], [place] [address]    Report Status 05/20/2021 FINAL  Final  Culture, blood (routine x 2)     Status: None   Collection Time: 05/15/21  9:07 AM   Specimen: BLOOD  Result Value Ref Range Status   Specimen Description BLOOD LEFT ANTECUBITAL  Final   Special Requests   Final    BOTTLES DRAWN AEROBIC AND ANAEROBIC Blood Culture results may not be optimal due to an inadequate volume of blood received in culture bottles   Culture   Final    NO GROWTH 5 DAYS Performed at [facility], [place] [address], [place], [place] [address]    Report Status 05/20/2021 FINAL  Final          Radiology Studies: No results found.      Scheduled Meds:  apixaban  5 mg Oral BID   vitamin C  500 mg Oral Daily   lidocaine  1 patch Transdermal Q24H    lisinopril  2.5 mg Oral Daily   [START ON 05/23/2021] predniSONE  20 mg Oral Q breakfast   QUEtiapine  12.5 mg Oral QHS   sodium chloride flush  3 mL Intravenous Q12H   zinc sulfate  220 mg Oral Daily   Continuous Infusions:  dextrose 5% lactated ringers            Henry Reasons, MD PhD FACP [facility] 05/22/2021, 3:26 PM

## 2021-05-22 NOTE — Progress Notes (Signed)
Bladder scan done-50 cc of residual urine detected.

## 2021-05-23 DIAGNOSIS — I160 Hypertensive urgency: Secondary | ICD-10-CM | POA: Diagnosis not present

## 2021-05-23 DIAGNOSIS — J309 Allergic rhinitis, unspecified: Secondary | ICD-10-CM | POA: Diagnosis not present

## 2021-05-23 DIAGNOSIS — Z743 Need for continuous supervision: Secondary | ICD-10-CM | POA: Diagnosis not present

## 2021-05-23 DIAGNOSIS — I2510 Atherosclerotic heart disease of native coronary artery without angina pectoris: Secondary | ICD-10-CM | POA: Diagnosis not present

## 2021-05-23 DIAGNOSIS — F0390 Unspecified dementia without behavioral disturbance: Secondary | ICD-10-CM | POA: Diagnosis not present

## 2021-05-23 DIAGNOSIS — I213 ST elevation (STEMI) myocardial infarction of unspecified site: Secondary | ICD-10-CM | POA: Diagnosis not present

## 2021-05-23 DIAGNOSIS — R410 Disorientation, unspecified: Secondary | ICD-10-CM | POA: Diagnosis not present

## 2021-05-23 DIAGNOSIS — I4891 Unspecified atrial fibrillation: Secondary | ICD-10-CM | POA: Diagnosis not present

## 2021-05-23 DIAGNOSIS — E785 Hyperlipidemia, unspecified: Secondary | ICD-10-CM | POA: Diagnosis not present

## 2021-05-23 DIAGNOSIS — U071 COVID-19: Secondary | ICD-10-CM | POA: Diagnosis not present

## 2021-05-23 DIAGNOSIS — N289 Disorder of kidney and ureter, unspecified: Secondary | ICD-10-CM | POA: Diagnosis not present

## 2021-05-23 DIAGNOSIS — R413 Other amnesia: Secondary | ICD-10-CM | POA: Diagnosis not present

## 2021-05-23 LAB — CBC WITH DIFFERENTIAL/PLATELET
Abs Immature Granulocytes: 0.15 10*3/uL — ABNORMAL HIGH (ref 0.00–0.07)
Basophils Absolute: 0 10*3/uL (ref 0.0–0.1)
Basophils Relative: 0 %
Eosinophils Absolute: 0.1 10*3/uL (ref 0.0–0.5)
Eosinophils Relative: 0 %
HCT: 40.6 % (ref 39.0–52.0)
Hemoglobin: 13.4 g/dL (ref 13.0–17.0)
Immature Granulocytes: 1 %
Lymphocytes Relative: 10 %
Lymphs Abs: 1.5 10*3/uL (ref 0.7–4.0)
MCH: 30.8 pg (ref 26.0–34.0)
MCHC: 33 g/dL (ref 30.0–36.0)
MCV: 93.3 fL (ref 80.0–100.0)
Monocytes Absolute: 1.8 10*3/uL — ABNORMAL HIGH (ref 0.1–1.0)
Monocytes Relative: 11 %
Neutro Abs: 12.3 10*3/uL — ABNORMAL HIGH (ref 1.7–7.7)
Neutrophils Relative %: 78 %
Platelets: 239 10*3/uL (ref 150–400)
RBC: 4.35 MIL/uL (ref 4.22–5.81)
RDW: 14.1 % (ref 11.5–15.5)
WBC: 15.8 10*3/uL — ABNORMAL HIGH (ref 4.0–10.5)
nRBC: 0 % (ref 0.0–0.2)

## 2021-05-23 LAB — BASIC METABOLIC PANEL
Anion gap: 3 — ABNORMAL LOW (ref 5–15)
BUN: 36 mg/dL — ABNORMAL HIGH (ref 8–23)
CO2: 29 mmol/L (ref 22–32)
Calcium: 8.3 mg/dL — ABNORMAL LOW (ref 8.9–10.3)
Chloride: 111 mmol/L (ref 98–111)
Creatinine, Ser: 1.13 mg/dL (ref 0.61–1.24)
GFR, Estimated: >60 mL/min (ref >60)
Glucose, Bld: 111 mg/dL — ABNORMAL HIGH (ref 70–99)
Potassium: 3.6 mmol/L (ref 3.5–5.1)
Sodium: 143 mmol/L (ref 135–145)

## 2021-05-23 MED ORDER — SENNOSIDES-DOCUSATE SODIUM 8.6-50 MG PO TABS: 1.0000 | ORAL_TABLET | Freq: Two times a day (BID) | ORAL | Status: AC

## 2021-05-23 MED ORDER — SENNOSIDES-DOCUSATE SODIUM 8.6-50 MG PO TABS
1.0000 | ORAL_TABLET | Freq: Two times a day (BID) | ORAL | Status: DC
  Administered 2021-05-23: 1 via ORAL
  Filled 2021-05-23: qty 1

## 2021-05-23 MED ORDER — ENSURE COMPLETE SHAKE PO LIQD: 1.0000 | Freq: Two times a day (BID) | ORAL | Status: AC | PRN

## 2021-05-23 MED ORDER — FOOD THICKENER (SIMPLYTHICK): 1.0000 | ORAL | Status: AC | PRN

## 2021-05-23 MED ORDER — LIDOCAINE 5 % EX PTCH: 1.0000 | MEDICATED_PATCH | CUTANEOUS | 0 refills | Status: AC

## 2021-05-23 MED ORDER — QUETIAPINE FUMARATE 25 MG PO TABS
12.5000 mg | ORAL_TABLET | Freq: Every day | ORAL | Status: AC
Start: 2021-05-23 — End: ?

## 2021-05-23 MED ORDER — PREDNISONE 10 MG PO TABS: 10.0000 mg | ORAL_TABLET | Freq: Every day | ORAL | 0 refills | Status: AC

## 2021-05-23 MED ORDER — ATORVASTATIN CALCIUM 40 MG PO TABS: 40.0000 mg | ORAL_TABLET | Freq: Every day | ORAL | 2 refills | Status: AC

## 2021-05-23 MED ORDER — ASCORBIC ACID 500 MG PO TABS: 500.0000 mg | ORAL_TABLET | Freq: Every day | ORAL | Status: AC

## 2021-05-23 MED ORDER — ZINC SULFATE 220 (50 ZN) MG PO CAPS
220.0000 mg | ORAL_CAPSULE | Freq: Every day | ORAL | Status: AC
Start: 2021-05-23 — End: ?

## 2021-05-23 NOTE — Progress Notes (Signed)
DISCHARGE NOTE SNF [name] to be discharged [facility]illed nursing facility [facility] per MD order. Patient verbalized understanding.  Skin clean, dry and intact without evidence of skin break down, no evidence of skin tears noted. IV catheter discontinued intact. Site without signs and symptoms of complications. Dressing and pressure applied. Pt denies pain at the site currently. No complaints noted.  Patient free of lines, drains, and wounds.   Discharge packet assembled. An After Visit Summary (AVS) was printed and given to the EMS personnel. Patient escorted via stretcher and discharged to [facility] via ambulance. Report called to accepting facility; all questions and concerns addressed.   Kelsie Kramp S Tamico Mundo, RN _______________________________________________________________________

## 2021-05-23 NOTE — Progress Notes (Signed)
Occupational Therapy Treatment Patient Details Name: Henry Franklin. MRN: [id number] DOB: Jul 01, 1926 Today's Date: 05/23/2021   History of present illness 87 y.o. male presents to [facility] hospital on 05/15/2021 after fall and difficulty ambulating at home. Pt found to be COVID+. PMH includes HTN, HLD, schemic cardiomyopathy, CAD, PAF on Eliquis, history of PE in 2015, essential tremor.   OT comments  Patient received in bed with feet off the side of bed and gown was removed. Patient was assisted with donning gown and socks. Patient required mod assist to get to EOB and was able to perform grooming seated on EOB with min guard for balance and assistance applying toothpaste. Patient performed 3 stands from EOB with RW with mod assist. Transfers not performed due to unsteady balance.  Patient returned to supine and appeared fatigued from treatment. Acute OT to continue to follow.    Recommendations for follow up therapy are one component of a multi-disciplinary discharge planning process, led by the attending physician.  Recommendations may be updated based on patient status, additional functional criteria and insurance authorization.    Follow Up Recommendations  Skilled nursing-short term rehab (<3 hours/day)    Assistance Recommended at Discharge Frequent or constant Supervision/Assistance  Equipment Recommendations  Other (comment) (TBD)    Recommendations for Other Services      Precautions / Restrictions Precautions Precautions: Fall Precaution Comments: COVID+ Restrictions Weight Bearing Restrictions: No       Mobility Bed Mobility Overal bed mobility: Needs Assistance Bed Mobility: Supine to Sit;Sit to Supine     Supine to sit: Mod assist Sit to supine: Mod assist   General bed mobility comments: difficulty following directions, required assistance with BLEs and scooting hips forward    Transfers Overall transfer level: Needs assistance Equipment used: Rolling walker (2  wheels) Transfers: Sit to/from Stand Sit to Stand: Mod assist;From elevated surface           General transfer comment: performed sit to stands from raised bed to RW x3     Balance Overall balance assessment: Needs assistance Sitting-balance support: Feet supported;Bilateral upper extremity supported Sitting balance-Leahy Scale: Poor Sitting balance - Comments: min guard during grooming tasks Postural control: Posterior lean Standing balance support: Bilateral upper extremity supported Standing balance-Leahy Scale: Poor Standing balance comment: reliant on RW for support when standing                           ADL either performed or assessed with clinical judgement   ADL Overall ADL's : Needs assistance/impaired     Grooming: Wash/dry hands;Wash/dry face;Oral care;Minimal assistance;Sitting Grooming Details (indicate cue type and reason): performed sitting on EOB with min assist for toothpaste         Upper Body Dressing : Moderate assistance;Bed level Upper Body Dressing Details (indicate cue type and reason): patient had removed gown and assisted to donn Lower Body Dressing: Maximal assistance;Sit to/from stand Lower Body Dressing Details (indicate cue type and reason): unable to donn socks               General ADL Comments: tremulous movements with BUE during grooming tasks    Extremity/Trunk Assessment              Vision       Perception     Praxis      Cognition Arousal/Alertness: Awake/alert Behavior During Therapy: WFL for tasks assessed/performed Overall Cognitive Status: Impaired/Different from baseline Area of Impairment: Attention;Memory;Following  commands;Safety/judgement;Awareness;Problem solving                   Current Attention Level: Sustained Memory: Decreased short-term memory Following Commands: Follows one step commands consistently;Follows multi-step commands inconsistently Safety/Judgement: Decreased  awareness of deficits Awareness: Intellectual Problem Solving: Slow processing;Decreased initiation;Requires verbal cues;Requires tactile cues;Difficulty sequencing General Comments: patient appeared to be attempting to get out of bed when COTA arrived          Exercises     Shoulder Instructions       General Comments      Pertinent Vitals/ Pain       Pain Assessment: Faces Faces Pain Scale: Hurts little more Pain Location: generalized Pain Descriptors / Indicators: Grimacing Pain Intervention(s): Monitored during session;Repositioned  Home Living                                          Prior Functioning/Environment              Frequency  Min 2X/week        Progress Toward Goals  OT Goals(current goals can now be found in the care plan section)  Progress towards OT goals: Progressing toward goals  Acute Rehab OT Goals OT Goal Formulation: Patient unable to participate in goal setting Time For Goal Achievement: 06-13-2021 Potential to Achieve Goals: Good ADL Goals Pt Will Perform Lower Body Bathing: with mod assist;sit to/from stand Pt Will Perform Lower Body Dressing: with mod assist;sit to/from stand Pt Will Transfer to Toilet: ambulating;with mod assist;stand pivot transfer;with min assist Pt Will Perform Toileting - Clothing Manipulation and hygiene: with mod assist;sit to/from stand;sitting/lateral leans  Plan Discharge plan remains appropriate    Co-evaluation                 AM-PAC OT "6 Clicks" Daily Activity     Outcome Measure   Help from another person eating meals?: Total Help from another person taking care of personal grooming?: A Little Help from another person toileting, which includes using toliet, bedpan, or urinal?: A Lot Help from another person bathing (including washing, rinsing, drying)?: A Lot Help from another person to put on and taking off regular upper body clothing?: A Lot Help from another person  to put on and taking off regular lower body clothing?: Total 6 Click Score: 11    End of Session Equipment Utilized During Treatment: Gait belt;Rolling walker (2 wheels)  OT Visit Diagnosis: Unsteadiness on feet (R26.81);Muscle weakness (generalized) (M62.81);Other symptoms and signs involving cognitive function   Activity Tolerance Patient tolerated treatment well   Patient Left in bed;with call bell/phone within reach;with bed alarm set   Nurse Communication Mobility status        Time: 9030-0923 OT Time Calculation (min): 21 min  Charges: OT General Charges $OT Visit: 1 Visit OT Treatments $Self Care/Home Management : 8-22 mins  Lodema Hong, [place]  Pager [phone] Office [place] 05/23/2021, 11:43 AM

## 2021-05-23 NOTE — Discharge Summary (Signed)
Discharge Summary  Henry Franklin. SAY:[id number] DOB: 1927-05-29  PCP: [name], MD  Admit date: 05/14/2021 Discharge date: 05/23/2021  Time spent: 61mins, more than 50% time spent on coordination of care.   Recommendations for Outpatient Follow-up:  F/u with SNF MD  for hospital discharge follow up, repeat cbc/bmp at follow up Palliative care to follow patient at SNF, son is aware of the plan and agreed with palliative care at [facility]   Discharge Diagnoses:  Active Hospital Problems   Diagnosis Date Noted   COVID-19 05/15/2021   Hypertensive urgency 05/15/2021   Fever 05/15/2021   Renal insufficiency 05/15/2021   History of pulmonary embolism 05/15/2021   Memory difficulties 08/24/2019   Atrial fibrillation ([place]) 11/21/2015   Fall at home, initial encounter 05/10/2013   Benign essential tremor    Hyperlipidemia     Resolved Hospital Problems  No resolved problems to display.    Discharge Condition: stable  Diet recommendation: aspiration precaution Diet recommendations: Dysphagia 1 (puree);Nectar-thick liquid Liquids provided via: Cup;Straw Medication Administration: Crushed with puree Supervision: Staff to assist with self feeding;Full supervision/cueing for compensatory strategies Compensations: Minimize environmental distractions;Small sips/bites;Slow rate Postural Changes and/or Swallow Maneuvers: Seated upright 90 degrees  Filed Weights   05/14/21 1955 05/21/21 2016  Weight: 83 kg 60.7 kg    History of present illness: ( per admitting MD Dr Tamala Julian) Chief Complaint: Weakness   I have personally briefly reviewed patient's old medical records in [place]     HPI: Henry Franklin. is a 87 y.o. male with medical history significant of hypertension, hyperlipidemia, ischemic cardiomyopathy, CAD, PAF on Eliquis, history of PE in 2015, essential tremor, and reports of dementia who presents for weakness that started 1-2 days ago.  History is obtained from  the patient with assistance of his son who is present at bedside.  Son reports that at baseline patient has been able to ambulate with use of a walker and has some dementia.  States that he always makes jokes and sometimes is hard to tell if these making a joke or serious when asking him questions.  Yesterday, patient was noted to be significantly weak was unable to get out of bed.  He had attempted to get out of bed and did fall. Patient states he hit his head, but son is not sure if this.  He denies any loss of consciousness or any significant pain is related to the fall.  At home they had checked him and he was positive for COVID.  He has received the vaccination and booster.  Notes associated symptoms of intermittently productive cough.  Son notes that they had been in the process of trying to get the help at home through the [place].  His mother is very particular and previously complained about the last company that came out,and so they had to restart the process over with the [place].   In route with EMS patient patient was noted to be febrile up to 101 F with blood pressure 162/100, respirations 22, O2 saturation maintained on room air.  He was given 1000 mg of acetaminophen prior to arrival.   ED Course: On admission to the emergency department patient was seen to be afebrile with heart rates initially reported as low as 36(currently 62-94), blood pressure 99/52-157/77, and O2 saturation maintained on room air.  Labs significant for WBC 7.3, BUN 25, creatinine 1.32,  calcium 8.7, and lactic acid 1.2.  Chest x-ray significant for mild bibasilar airspace  disease greater on the left than the right favoring atelectasis, but left lower lung pneumonia cannot be excluded.  CT scan of the head and cervical spine negative for any acute abnormalities, but did note prior right frontal and thalamic infarct. Urinalysis was positive for ketones, small hemoglobin, and rare bacteria but no significant signs of infection  appreciated. COVID-19 screening was positive.  Patient was given 1 L normal saline IV fluids.  TRH called to admit.  Hospital Course:  Principal Problem:   COVID-19 Active Problems:   Hyperlipidemia   Benign essential tremor   Fall at home, initial encounter   Atrial fibrillation [facility])   Memory difficulties   Hypertensive urgency   Fever   Renal insufficiency   History of pulmonary embolism  COVID-19 pneumonia -Received booster dose in 04/2021 --.Chest x-ray: Could not rule out left lower lobe pneumonia, procalcitonin less than 0.1, blood culture no growth - Completed 5 days of IV remdesivir.  Plan to finish total of 10 days of steroid treatment, taper steroid to off . -   inflammatory markers down, on room air, no fever, I did not hear any cough      FTT Presented with worsening generalized weakness and fall.  CT of the head and neck negative for any acute abnormality Patient lives with wife at home prior to admission, per pcp note from 04/2021 he was at baseline doing well, he received covid vaccine and was told to return in one year Increased weakness likely due to acute illness Seen by PT recommend SNF Seen by speech, started on dysphagia diet Son agreed to palliative care follow patient at SNF   Leukocytosis Likely from steroid, also have poor oral intake ,taper steroid, he received  gentle hydration  Wbc coming down, encourage oral intake    Hyponatremia Sodium peaked at 146  Likely from dehydration Received hydration, sodium normalized, encourage oral intake    Acute kidney injury, present on admission -Creatinine 1.32 on presentation; 1.06 on 10/05/2020.   -Creatinine improved .  Encourage oral intake.    Hypertensive urgency, on admission -Blood pressure improving.  Continue lisinopril.     Paroxysmal A. fib on chronic anticoagulation -Rate controlled without meds - Continue Eliquis.  Outpatient follow-up with cardiology    History of PE in  2015 -Continue Eliquis   Hyperlipidemia -Statin on hold due to elevated liver enzymes -SNF MD to repeat lft/ck, statin resumption per SNF MD  Elevated LFTs -Questionable cause.  Improving.  Monitor  -Possible related to mild rhabdomyolysis    Elevated CK -Possibly from fall and mild rhabdomyolysis.   -ck improved    Memory difficulties Intermittent agitation/delirium -Patient's son reported that his father likely had some dementia, no formal diagnosis  -Delirium precautions.  Fall precautions.  PT eval. -After discussion with patient's son on 05/16/2021, son decided to change his father's CODE STATUS to DNR. --Patient has had issues with intermittent agitation requiring intermittent Haldol.  Low-dose Seroquel started on 05/20/2021   Spinal stenosis -At baseline, patient walks with the use of walker and has a history of severe spinal stenosis previously documented by MRI. -back pain, will order lidocaine patch , per RN he is easily sedated with narcotic analgesics    DVT prophylaxis: Eliquis Code Status: DNR Family Communication: Son on phone on 05/22/2021 and 05/23/2021 Procedures: none  Consultations: none  Discharge Exam: BP (!) 141/97    Pulse 89    Temp (!) 97.5 F (36.4 C) (Oral)    Resp 17  Ht 5\' 11"  (1.803 m)    Wt 60.7 kg    SpO2 96%    BMI 18.66 kg/m   General: frail elderly, NAD, slightly confused, but  pleasant, no agitation this am Cardiovascular: RRR Respiratory: normal respiratory effort  Discharge Instructions You were cared for by a hospitalist during your hospital stay. If you have any questions about your discharge medications or the care you received while you were in the hospital after you are discharged, you can call the unit and asked to speak with the hospitalist on call if the hospitalist that took care of you is not available. Once you are discharged, your primary care physician will handle any further medical issues. Please note that NO  REFILLS for any discharge medications will be authorized once you are discharged, as it is imperative that you return to your primary care physician (or establish a relationship with a primary care physician if you do not have one) for your aftercare needs so that they can reassess your need for medications and monitor your lab values.  Discharge Instructions     Diet general   Complete by: As directed    Dysphagia 1 (Puree);Nectar-thick liquid   Liquid Administration via: Cup;Straw Medication Administration: Crushed with puree Aspiration precaution Speech to continue follow patient   Discharge wound care:   Complete by: As directed    Skin care qshift, Pressure offloading   Increase activity slowly   Complete by: As directed       Allergies as of 05/23/2021   No Known Allergies      Medication List     STOP taking these medications    aspirin EC 81 MG tablet   docusate sodium 100 MG capsule Commonly known as: COLACE       TAKE these medications    acetaminophen 325 MG tablet Commonly known as: TYLENOL Take 2 tablets (650 mg total) by mouth every 6 (six) hours as needed for mild pain, moderate pain or fever.   apixaban 5 MG Tabs tablet Commonly known as: ELIQUIS Take 1 tablet (5 mg total) by mouth 2 (two) times daily.   ascorbic acid 500 MG tablet Commonly known as: VITAMIN C Take 1 tablet (500 mg total) by mouth daily.   atorvastatin 40 MG tablet Commonly known as: LIPITOR Take 1 tablet (40 mg total) by mouth daily. Hold statin for now, repeat lft and ck, SNF MD to determine when to resume What changed: additional instructions   Ensure Complete Shake Liqd Take 1 Bottle by mouth 3 times/day as needed-between meals & bedtime.   food thickener Gel Commonly known as: SIMPLYTHICK (NECTAR/LEVEL 2/MILDLY THICK) Take 1 packet by mouth as needed.   lidocaine 5 % Commonly known as: LIDODERM Place 1 patch onto the skin daily. Remove & Discard patch within 12  hours or as directed by MD   lisinopril 2.5 MG tablet Commonly known as: ZESTRIL TAKE 1 TABLET BY MOUTH EVERY DAY   multivitamin tablet Take 1 tablet by mouth daily.   nitroGLYCERIN 0.4 MG SL tablet Commonly known as: NITROSTAT Place 1 tablet (0.4 mg total) under the tongue every 5 (five) minutes as needed for chest pain.   predniSONE 10 MG tablet Commonly known as: DELTASONE Take 1 tablet (10 mg total) by mouth daily with breakfast for 2 days.   QUEtiapine 25 MG tablet Commonly known as: SEROQUEL Take 0.5 tablets (12.5 mg total) by mouth at bedtime.   senna-docusate 8.6-50 MG tablet Commonly known as: Senokot-S  Take 1 tablet by mouth 2 (two) times daily.   Vitamin D-3 25 MCG (1000 UT) Caps Take 1 capsule by mouth daily.   zinc sulfate 220 (50 Zn) MG capsule Take 1 capsule (220 mg total) by mouth daily.               Discharge Care Instructions  (From admission, onward)           Start     Ordered   05/23/21 0000  Discharge wound care:       Comments: Skin care qshift, Pressure offloading   05/23/21 1015           No Known Allergies    The results of significant diagnostics from this hospitalization (including imaging, microbiology, ancillary and laboratory) are listed below for reference.    Significant Diagnostic Studies: CT Head Wo Contrast  Result Date: 05/15/2021 CLINICAL DATA:  Head trauma, fall, generalized weakness for few days, tested positive for COVID-19, moderate to severe head trauma EXAM: CT HEAD WITHOUT CONTRAST CT CERVICAL SPINE WITHOUT CONTRAST TECHNIQUE: Multidetector CT imaging of the head and cervical spine was performed following the standard protocol without intravenous contrast. Multiplanar CT image reconstructions of the cervical spine were also generated. COMPARISON:  05/01/2004 FINDINGS: CT HEAD FINDINGS Brain: Motion artifacts, for which repeat imaging was performed. Generalized atrophy. Ex vacuo dilatation of the  ventricular system unchanged. No midline shift or mass effect. Small vessel chronic ischemic changes of deep cerebral white matter. Tiny old lacunar infarct RIGHT thalamus unchanged. Old RIGHT frontal infarct with associated dystrophic calcifications, question related to prior infarction or old underlying vascular malformation. Appearance is unchanged from the previous exam. No intracranial hemorrhage, mass lesion, or evidence of acute infarction. No extra-axial fluid collections. Vascular: Atherosclerotic calcification of internal carotid and vertebral arteries at skull base Skull: Intact Sinuses/Orbits: Clear Other: N/A CT CERVICAL SPINE FINDINGS Alignment: Minimal retrolisthesis at C5-C6. Remaining alignments normal. Skull base and vertebrae: Osseous demineralization. Skull base intact. Vertebral body heights maintained. Multilevel degenerative disc disease changes with disc space narrowing and endplate spur formation at C4-C7. Encroachment upon BILATERAL cervical neural foramina at multiple levels by uncovertebral spurs. Severe multilevel facet degenerative changes. No fracture, additional subluxation, or bone destruction. Extensive degenerative changes between the odontoid process and anterior arch of C1. Soft tissues and spinal canal: Prevertebral soft tissues normal thickness. Extensive atherosclerotic calcifications LEFT greater than RIGHT carotid bifurcations. Tiny BILATERAL thyroid nodules up to 1.1 cm diameter; Not clinically significant; no follow-up imaging recommended (ref: J Am Coll Radiol. 2015 Feb;12(2): 143-50). Disc levels: Scattered bulging discs and AP narrowing of cervical spinal canal, primarily by endplate spurring. Upper chest: LEFT apical scarring. Other: N/A IMPRESSION: Atrophy with small vessel chronic ischemic changes of deep cerebral white matter. 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 No acute intracranial abnormalities. Multilevel degenerative disc and facet disease changes of  the cervical spine as above. No acute cervical spine abnormalities. Electronically Signed   By: Lavonia Dana M.D.   On: 05/15/2021 09:19   CT Cervical Spine Wo Contrast  Result Date: 05/15/2021 CLINICAL DATA:  Head trauma, fall, generalized weakness for few days, tested positive for COVID-19, moderate to severe head trauma EXAM: CT HEAD WITHOUT CONTRAST CT CERVICAL SPINE WITHOUT CONTRAST TECHNIQUE: Multidetector CT imaging of the head and cervical spine was performed following the standard protocol without intravenous contrast. Multiplanar CT image reconstructions of the cervical spine were also generated. COMPARISON:  05/01/2004 FINDINGS: CT HEAD FINDINGS Brain: Motion artifacts, for  which repeat imaging was performed. Generalized atrophy. Ex vacuo dilatation of the ventricular system unchanged. No midline shift or mass effect. Small vessel chronic ischemic changes of deep cerebral white matter. Tiny old lacunar infarct RIGHT thalamus unchanged. Old RIGHT frontal infarct with associated dystrophic calcifications, question related to prior infarction or old underlying vascular malformation. Appearance is unchanged from the previous exam. No intracranial hemorrhage, mass lesion, or evidence of acute infarction. No extra-axial fluid collections. Vascular: Atherosclerotic calcification of internal carotid and vertebral arteries at skull base Skull: Intact Sinuses/Orbits: Clear Other: N/A CT CERVICAL SPINE FINDINGS Alignment: Minimal retrolisthesis at C5-C6. Remaining alignments normal. Skull base and vertebrae: Osseous demineralization. Skull base intact. Vertebral body heights maintained. Multilevel degenerative disc disease changes with disc space narrowing and endplate spur formation at C4-C7. Encroachment upon BILATERAL cervical neural foramina at multiple levels by uncovertebral spurs. Severe multilevel facet degenerative changes. No fracture, additional subluxation, or bone destruction. Extensive degenerative  changes between the odontoid process and anterior arch of C1. Soft tissues and spinal canal: Prevertebral soft tissues normal thickness. Extensive atherosclerotic calcifications LEFT greater than RIGHT carotid bifurcations. Tiny BILATERAL thyroid nodules up to 1.1 cm diameter; Not clinically significant; no follow-up imaging recommended (ref: J Am Coll Radiol. 2015 Feb;12(2): 143-50). Disc levels: Scattered bulging discs and AP narrowing of cervical spinal canal, primarily by endplate spurring. Upper chest: LEFT apical scarring. Other: N/A IMPRESSION: Atrophy with small vessel chronic ischemic changes of deep cerebral white matter. 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 No acute intracranial abnormalities. Multilevel degenerative disc and facet disease changes of the cervical spine as above. No acute cervical spine abnormalities. Electronically Signed   By: Lavonia Dana M.D.   On: 05/15/2021 09:19   DG Chest Port 1 View  Result Date: 05/15/2021 CLINICAL DATA:  Dyspnea.  Weakness.  COVID positive. EXAM: PORTABLE CHEST 1 VIEW COMPARISON:  06/19/2016 FINDINGS: Single AP portable view of the chest with overlying wires and leads. Midline trachea. Mild cardiomegaly. Atherosclerosis in the transverse aorta. No pleural effusion or pneumothorax. No congestive failure. Mild left greater than right base airspace disease medially. IMPRESSION: Mild bibasilar airspace disease, greater left than right. Favor atelectasis. Especially at the left lung base, pneumonia cannot be excluded. Consider PA and lateral radiographs if feasible. Cardiomegaly without congestive failure. Aortic Atherosclerosis (ICD10-I70.0). Electronically Signed   By: Abigail Miyamoto M.D.   On: 05/15/2021 08:17    Microbiology: Recent Results (from the past 240 hour(s))  Resp Panel by RT-PCR (Flu A&B, Covid) Nasopharyngeal Swab     Status: Abnormal   Collection Time: 05/14/21  8:08 PM   Specimen: Nasopharyngeal Swab; Nasopharyngeal(NP) swabs in  vial transport medium  Result Value Ref Range Status   SARS Coronavirus 2 by RT PCR POSITIVE (A) NEGATIVE Final    Comment: (NOTE) SARS-CoV-2 target nucleic acids are DETECTED.  The SARS-CoV-2 RNA is generally detectable in upper respiratory specimens during the acute phase of infection. Positive results are indicative of the presence of the identified virus, but do not rule out bacterial infection or co-infection with other pathogens not detected by the test. Clinical correlation with patient history and other diagnostic information is necessary to determine patient infection status. The expected result is Negative.  Fact Sheet for Patients: EntrepreneurPulse.com.au  Fact Sheet for Healthcare Providers: IncredibleEmployment.be  This test is not yet approved or cleared by the [place] FDA and  has been authorized for detection and/or diagnosis of SARS-CoV-2 by FDA under an Emergency Use Authorization (EUA).  This  EUA will remain in effect (meaning this test can be used) for the duration of  the COVID-19 declaration under Section 564(b)(1) of the A ct, 21 U.S.C. section 360bbb-3(b)(1), unless the authorization is terminated or revoked sooner.     Influenza A by PCR NEGATIVE NEGATIVE Final   Influenza B by PCR NEGATIVE NEGATIVE Final    Comment: (NOTE) The Xpert Xpress SARS-CoV-2/FLU/RSV plus assay is intended as an aid in the diagnosis of influenza from Nasopharyngeal swab specimens and should not be used as a sole basis for treatment. Nasal washings and aspirates are unacceptable for Xpert Xpress SARS-CoV-2/FLU/RSV testing.  Fact Sheet for Patients: EntrepreneurPulse.com.au  Fact Sheet for Healthcare Providers: IncredibleEmployment.be  This test is not yet approved or cleared by the [place] FDA and has been authorized for detection and/or diagnosis of SARS-CoV-2 by FDA under an Emergency Use  Authorization (EUA). This EUA will remain in effect (meaning this test can be used) for the duration of the COVID-19 declaration under Section 564(b)(1) of the Act, 21 U.S.C. section 360bbb-3(b)(1), unless the authorization is terminated or revoked.  Performed at [facility], [place] [address], [place], [place] [address]   Culture, blood (routine x 2)     Status: None   Collection Time: 05/15/21  8:56 AM   Specimen: BLOOD RIGHT HAND  Result Value Ref Range Status   Specimen Description BLOOD RIGHT HAND  Final   Special Requests   Final    BOTTLES DRAWN AEROBIC ONLY Blood Culture results may not be optimal due to an inadequate volume of blood received in culture bottles   Culture   Final    NO GROWTH 5 DAYS Performed at [facility], [place] [address], [place], [place] [address]    Report Status 05/20/2021 FINAL  Final  Culture, blood (routine x 2)     Status: None   Collection Time: 05/15/21  9:07 AM   Specimen: BLOOD  Result Value Ref Range Status   Specimen Description BLOOD LEFT ANTECUBITAL  Final   Special Requests   Final    BOTTLES DRAWN AEROBIC AND ANAEROBIC Blood Culture results may not be optimal due to an inadequate volume of blood received in culture bottles   Culture   Final    NO GROWTH 5 DAYS Performed at [facility], [place] [address], [place], [place] [address]    Report Status 05/20/2021 FINAL  Final     Labs: Basic Metabolic Panel: Recent Labs  Lab 05/17/21 0450 05/18/21 0257 05/19/21 0751 05/20/21 0237 05/22/21 0428 05/23/21 0706  NA 138 137 141 140 146* 143  K 3.7 3.8 3.7 4.2 3.7 3.6  CL 105 105 106 108 112* 111  CO2 25 28 27 23 29 29   GLUCOSE 139* 143* 105* 104* 112* 111*  BUN 45* 43* 39* 38* 32* 36*  CREATININE 1.17 1.11 1.17 1.24 1.11 1.13  CALCIUM 8.3* 8.3* 8.7* 8.2* 8.5* 8.3*  MG 2.0 2.0 2.3 2.1  --   --    Liver Function Tests: Recent Labs  Lab 05/17/21 0450 05/18/21 0257 05/19/21 0751 05/20/21 0237 05/22/21 0428  AST  68* 49* 48* 47* 25  ALT 41 39 42 35 35  ALKPHOS 46 51 57 49 52  BILITOT 0.6 0.6 1.2 1.0 0.9  PROT 4.7* 4.8* 5.4* 4.5* 4.6*  ALBUMIN 2.4* 2.4* 2.8* 2.5* 2.5*   No results for input(s): LIPASE, AMYLASE in the last 168 hours. No results for input(s): AMMONIA in the last  168 hours. CBC: Recent Labs  Lab 05/17/21 0450 05/18/21 0257 05/22/21 0428 05/23/21 0706  WBC 10.6* 9.2 16.5* 15.8*  NEUTROABS 8.9* 8.0* 13.9* 12.3*  HGB 15.4 16.0 15.2 13.4  HCT 46.1 47.2 46.8 40.6  MCV 92.0 92.2 92.5 93.3  PLT 158 168 259 239   Cardiac Enzymes: No results for input(s): CKTOTAL, CKMB, CKMBINDEX, TROPONINI in the last 168 hours. BNP: BNP (last 3 results) Recent Labs    05/15/21 1259  BNP 224.7*    ProBNP (last 3 results) No results for input(s): PROBNP in the last 8760 hours.  CBG: No results for input(s): GLUCAP in the last 168 hours.     Signed:  Florencia Reasons MD, PhD, FACP  [facility] 05/23/2021, 10:42 AM

## 2021-05-23 NOTE — TOC Transition Note (Signed)
Transition of Care [facility]) - CM/SW Discharge Note   Patient Details  Name: Henry Franklin. MRN: [id number] Date of Birth: February 06, 1927  Transition of [facility]) CM/SW Contact:  Milinda Antis, [place] Phone Number: 05/23/2021, 12:05 PM   Clinical Narrative:    Patient will DC to:  [facility]  Anticipated DC date:  05/23/2021 Family notified: Yes Transport by: [facility]   Per MD patient ready for DC to SNF . RN to call report prior to discharge [phone] room 402. RN, patient, patient's family, and facility notified of DC. Discharge Summary and FL2 sent to facility. DC packet on chart. Ambulance transport requested for patient.   CSW will sign off for now as social work intervention is no longer needed. Please consult [place] again if new needs arise.       Barriers to Discharge: Continued Medical Work up, Other (must enter comment) (Patient refusing SNF, verifying natural supports)   Patient Goals and CMS Choice Patient states their goals for this hospitalization and ongoing recovery are:: To go home CMS Medicare.gov Compare Post Acute Care list provided to:: Patient Choice offered to / list presented to : Patient  Discharge Placement                       Discharge Plan and Services                                     Social Determinants of Health (SDOH) Interventions     Readmission Risk Interventions No flowsheet data found.

## 2021-05-23 NOTE — Progress Notes (Signed)
Tried to call twice and give report no answer from facility.

## 2021-05-28 DIAGNOSIS — J309 Allergic rhinitis, unspecified: Secondary | ICD-10-CM | POA: Diagnosis not present

## 2021-05-28 DIAGNOSIS — Z515 Encounter for palliative care: Secondary | ICD-10-CM | POA: Diagnosis not present

## 2021-05-28 DIAGNOSIS — N390 Urinary tract infection, site not specified: Secondary | ICD-10-CM | POA: Diagnosis not present

## 2021-05-28 DIAGNOSIS — N289 Disorder of kidney and ureter, unspecified: Secondary | ICD-10-CM | POA: Diagnosis not present

## 2021-05-28 DIAGNOSIS — U071 COVID-19: Secondary | ICD-10-CM | POA: Diagnosis not present

## 2021-05-28 DIAGNOSIS — R627 Adult failure to thrive: Secondary | ICD-10-CM | POA: Diagnosis not present

## 2021-05-28 DIAGNOSIS — R451 Restlessness and agitation: Secondary | ICD-10-CM | POA: Diagnosis not present

## 2021-05-28 DIAGNOSIS — R630 Anorexia: Secondary | ICD-10-CM | POA: Diagnosis not present

## 2021-05-30 DIAGNOSIS — Z8616 Personal history of COVID-19: Secondary | ICD-10-CM | POA: Diagnosis not present

## 2021-05-30 DIAGNOSIS — R630 Anorexia: Secondary | ICD-10-CM | POA: Diagnosis not present

## 2021-05-30 DIAGNOSIS — Z515 Encounter for palliative care: Secondary | ICD-10-CM | POA: Diagnosis not present

## 2021-05-30 DIAGNOSIS — R627 Adult failure to thrive: Secondary | ICD-10-CM | POA: Diagnosis not present

## 2021-05-31 ENCOUNTER — Telehealth: Payer: Self-pay | Admitting: Internal Medicine

## 2021-05-31 NOTE — Telephone Encounter (Signed)
Beth from funeral home calling in  Says patient passed away 28-Jun-2021 & she is needing provider to sign off on death certificate

## 2021-06-02 DEATH — deceased

## 2021-06-04 NOTE — Telephone Encounter (Signed)
I do not have his case assigned to me on the [facility] [place] website

## 2021-06-05 NOTE — Telephone Encounter (Signed)
Called today and spoke with Fraser Din today.  Elmon Kirschner, NP signed off on death certificate so patient is set.  Will contact medical records to let them know patient is deceased so they can mark his chart to cancel out all future appointments.

## 2021-07-02 ENCOUNTER — Telehealth: Payer: Medicare HMO

## 2021-08-07 ENCOUNTER — Ambulatory Visit: Payer: Medicare HMO

## 2021-09-30 ENCOUNTER — Ambulatory Visit: Payer: Medicare HMO | Admitting: Podiatry
